# Patient Record
Sex: Male | Born: 1965 | Race: White | Hispanic: No | Marital: Married | Smoking: Never smoker
Health system: Southern US, Community
[De-identification: ages and names within clinical notes are randomized; demographics above are authoritative.]

## PROBLEM LIST (undated history)

## (undated) DIAGNOSIS — I10 Essential (primary) hypertension: Secondary | ICD-10-CM

## (undated) DIAGNOSIS — R519 Headache, unspecified: Secondary | ICD-10-CM

## (undated) DIAGNOSIS — R51 Headache: Secondary | ICD-10-CM

## (undated) DIAGNOSIS — B20 Human immunodeficiency virus [HIV] disease: Secondary | ICD-10-CM

## (undated) DIAGNOSIS — Z21 Asymptomatic human immunodeficiency virus [HIV] infection status: Secondary | ICD-10-CM

## (undated) DIAGNOSIS — G8929 Other chronic pain: Secondary | ICD-10-CM

## (undated) DIAGNOSIS — E785 Hyperlipidemia, unspecified: Secondary | ICD-10-CM

## (undated) HISTORY — PX: OTHER SURGICAL HISTORY: SHX169

## (undated) HISTORY — PX: DENTAL SURGERY: SHX609

## (undated) HISTORY — DX: Essential (primary) hypertension: I10

## (undated) HISTORY — DX: Hyperlipidemia, unspecified: E78.5

## (undated) HISTORY — DX: Headache, unspecified: R51.9

## (undated) HISTORY — DX: Other chronic pain: G89.29

## (undated) HISTORY — DX: Headache: R51

---

## 2006-07-06 ENCOUNTER — Emergency Department (HOSPITAL_COMMUNITY): Admission: EM | Admit: 2006-07-06 | Discharge: 2006-07-06 | Payer: Self-pay | Admitting: Emergency Medicine

## 2009-11-02 ENCOUNTER — Inpatient Hospital Stay (HOSPITAL_COMMUNITY): Admission: EM | Admit: 2009-11-02 | Discharge: 2009-11-04 | Payer: Self-pay | Admitting: Emergency Medicine

## 2009-11-03 ENCOUNTER — Encounter (INDEPENDENT_AMBULATORY_CARE_PROVIDER_SITE_OTHER): Payer: Self-pay | Admitting: Internal Medicine

## 2010-09-17 LAB — BASIC METABOLIC PANEL
BUN: 17 mg/dL (ref 6–23)
CO2: 25 mEq/L (ref 19–32)
Calcium: 9.3 mg/dL (ref 8.4–10.5)
Chloride: 110 mEq/L (ref 96–112)
Creatinine, Ser: 0.91 mg/dL (ref 0.4–1.5)
GFR calc non Af Amer: >60 mL/min (ref >60)
Glucose, Bld: 104 mg/dL — ABNORMAL HIGH (ref 70–99)

## 2010-09-17 LAB — TROPONIN I: Troponin I: <0.01 ng/mL (ref 0.00–0.06)

## 2010-09-17 LAB — DIFFERENTIAL: Neutro Abs: 5.1 10*3/uL (ref 1.7–7.7)

## 2010-09-17 LAB — CARDIAC PANEL(CRET KIN+CKTOT+MB+TROPI)
CK, MB: 1.8 ng/mL (ref 0.3–4.0)
CK, MB: 1.9 ng/mL (ref 0.3–4.0)
CK, MB: 2.1 ng/mL (ref 0.3–4.0)
Relative Index: 0.6 (ref 0.0–2.5)
Total CK: 220 U/L (ref 7–232)
Total CK: 242 U/L — ABNORMAL HIGH (ref 7–232)
Troponin I: 0.02 ng/mL (ref 0.00–0.06)
Troponin I: 0.02 ng/mL (ref 0.00–0.06)

## 2010-09-17 LAB — LIPID PANEL
HDL: 35 mg/dL — ABNORMAL LOW (ref >39)
Total CHOL/HDL Ratio: 4.8 RATIO

## 2010-09-17 LAB — CBC
Hemoglobin: 13.6 g/dL (ref 13.0–17.0)
Platelets: 238 10*3/uL (ref 150–400)
RBC: 4.29 MIL/uL (ref 4.22–5.81)
WBC: 8.5 10*3/uL (ref 4.0–10.5)

## 2010-09-17 LAB — CK TOTAL AND CKMB (NOT AT ARMC)
CK, MB: 2.6 ng/mL (ref 0.3–4.0)
Relative Index: 1 (ref 0.0–2.5)
Total CK: 264 U/L — ABNORMAL HIGH (ref 7–232)

## 2010-09-17 LAB — POCT CARDIAC MARKERS
CKMB, poc: 1.3 ng/mL (ref 1.0–8.0)
Myoglobin, poc: 61.8 ng/mL (ref 12–200)
Myoglobin, poc: 62.9 ng/mL (ref 12–200)
Troponin i, poc: <0.05 ng/mL (ref 0.00–0.09)
Troponin i, poc: <0.05 ng/mL (ref 0.00–0.09)

## 2013-07-07 ENCOUNTER — Other Ambulatory Visit: Payer: Self-pay | Admitting: Family Medicine

## 2013-07-07 DIAGNOSIS — R0602 Shortness of breath: Secondary | ICD-10-CM

## 2013-09-02 ENCOUNTER — Ambulatory Visit
Admission: RE | Admit: 2013-09-02 | Discharge: 2013-09-02 | Disposition: A | Discharge status: Home / Self Care | Payer: BC Managed Care – PPO | Source: Ambulatory Visit | Attending: Family Medicine | Admitting: Family Medicine

## 2013-09-02 ENCOUNTER — Other Ambulatory Visit: Payer: Self-pay | Admitting: Family Medicine

## 2013-09-02 DIAGNOSIS — IMO0001 Reserved for inherently not codable concepts without codable children: Secondary | ICD-10-CM

## 2013-09-02 DIAGNOSIS — R0600 Dyspnea, unspecified: Secondary | ICD-10-CM

## 2013-09-02 DIAGNOSIS — K219 Gastro-esophageal reflux disease without esophagitis: Secondary | ICD-10-CM

## 2013-09-02 DIAGNOSIS — R079 Chest pain, unspecified: Secondary | ICD-10-CM

## 2013-09-02 MED ORDER — IOHEXOL 350 MG/ML SOLN
100.0000 mL | Freq: Once | INTRAVENOUS | Status: AC | PRN
  Administered 2013-09-02: 100 mL via INTRAVENOUS

## 2013-09-13 ENCOUNTER — Encounter (HOSPITAL_COMMUNITY): Payer: Self-pay

## 2013-09-13 ENCOUNTER — Encounter: Payer: Self-pay | Admitting: Internal Medicine

## 2013-09-13 ENCOUNTER — Inpatient Hospital Stay (HOSPITAL_COMMUNITY)
Admission: AD | Admit: 2013-09-13 | Discharge: 2013-09-20 | DRG: 974 | Disposition: A | Discharge status: Home / Self Care | Payer: BC Managed Care – PPO | Source: Ambulatory Visit | Attending: Internal Medicine | Admitting: Internal Medicine

## 2013-09-13 ENCOUNTER — Ambulatory Visit (INDEPENDENT_AMBULATORY_CARE_PROVIDER_SITE_OTHER)
Admission: RE | Admit: 2013-09-13 | Discharge: 2013-09-13 | Disposition: A | Discharge status: Home / Self Care | Payer: BC Managed Care – PPO | Source: Ambulatory Visit | Attending: Internal Medicine | Admitting: Internal Medicine

## 2013-09-13 ENCOUNTER — Telehealth: Payer: Self-pay | Admitting: Internal Medicine

## 2013-09-13 ENCOUNTER — Ambulatory Visit (INDEPENDENT_AMBULATORY_CARE_PROVIDER_SITE_OTHER): Payer: BC Managed Care – PPO | Admitting: Internal Medicine

## 2013-09-13 ENCOUNTER — Encounter (INDEPENDENT_AMBULATORY_CARE_PROVIDER_SITE_OTHER): Payer: Self-pay

## 2013-09-13 VITALS — BP 152/80 | HR 113 | Temp 97.9°F | Ht 70.0 in | Wt 179.2 lb

## 2013-09-13 DIAGNOSIS — B20 Human immunodeficiency virus [HIV] disease: Principal | ICD-10-CM | POA: Diagnosis present

## 2013-09-13 DIAGNOSIS — E785 Hyperlipidemia, unspecified: Secondary | ICD-10-CM | POA: Diagnosis present

## 2013-09-13 DIAGNOSIS — K5900 Constipation, unspecified: Secondary | ICD-10-CM | POA: Diagnosis present

## 2013-09-13 DIAGNOSIS — I519 Heart disease, unspecified: Secondary | ICD-10-CM

## 2013-09-13 DIAGNOSIS — R918 Other nonspecific abnormal finding of lung field: Secondary | ICD-10-CM

## 2013-09-13 DIAGNOSIS — I10 Essential (primary) hypertension: Secondary | ICD-10-CM | POA: Diagnosis present

## 2013-09-13 DIAGNOSIS — J9600 Acute respiratory failure, unspecified whether with hypoxia or hypercapnia: Secondary | ICD-10-CM

## 2013-09-13 DIAGNOSIS — B370 Candidal stomatitis: Secondary | ICD-10-CM | POA: Diagnosis present

## 2013-09-13 DIAGNOSIS — IMO0002 Reserved for concepts with insufficient information to code with codable children: Secondary | ICD-10-CM

## 2013-09-13 DIAGNOSIS — Z8249 Family history of ischemic heart disease and other diseases of the circulatory system: Secondary | ICD-10-CM

## 2013-09-13 DIAGNOSIS — B59 Pneumocystosis: Secondary | ICD-10-CM | POA: Diagnosis present

## 2013-09-13 DIAGNOSIS — R12 Heartburn: Secondary | ICD-10-CM | POA: Diagnosis present

## 2013-09-13 DIAGNOSIS — Z7982 Long term (current) use of aspirin: Secondary | ICD-10-CM

## 2013-09-13 DIAGNOSIS — Z800 Family history of malignant neoplasm of digestive organs: Secondary | ICD-10-CM

## 2013-09-13 LAB — URINALYSIS, ROUTINE W REFLEX MICROSCOPIC
BILIRUBIN URINE: NEGATIVE
Glucose, UA: NEGATIVE mg/dL
Hgb urine dipstick: NEGATIVE
Ketones, ur: NEGATIVE mg/dL
Leukocytes, UA: NEGATIVE
NITRITE: NEGATIVE
PROTEIN: 30 mg/dL — AB
SPECIFIC GRAVITY, URINE: 1.02 (ref 1.005–1.030)
UROBILINOGEN UA: 1 mg/dL (ref 0.0–1.0)
pH: 6 (ref 5.0–8.0)

## 2013-09-13 LAB — URINE MICROSCOPIC-ADD ON

## 2013-09-13 LAB — COMPREHENSIVE METABOLIC PANEL
ALT: 22 U/L (ref 0–53)
AST: 64 U/L — ABNORMAL HIGH (ref 0–37)
Albumin: 3 g/dL — ABNORMAL LOW (ref 3.5–5.2)
Alkaline Phosphatase: 57 U/L (ref 39–117)
BILIRUBIN TOTAL: 0.5 mg/dL (ref 0.3–1.2)
BUN: 15 mg/dL (ref 6–23)
CHLORIDE: 95 meq/L — AB (ref 96–112)
CO2: 23 mEq/L (ref 19–32)
CREATININE: 0.93 mg/dL (ref 0.50–1.35)
Calcium: 8.8 mg/dL (ref 8.4–10.5)
GFR calc Af Amer: >90 mL/min (ref >90)
GFR calc non Af Amer: >90 mL/min (ref >90)
Glucose, Bld: 120 mg/dL — ABNORMAL HIGH (ref 70–99)
Potassium: 3.9 mEq/L (ref 3.7–5.3)
Sodium: 134 mEq/L — ABNORMAL LOW (ref 137–147)
TOTAL PROTEIN: 8.1 g/dL (ref 6.0–8.3)

## 2013-09-13 LAB — CBC WITH DIFFERENTIAL/PLATELET
BASOS ABS: 0 10*3/uL (ref 0.0–0.1)
Basophils Relative: 0 % (ref 0–1)
Eosinophils Absolute: 0 10*3/uL (ref 0.0–0.7)
Eosinophils Relative: 0 % (ref 0–5)
HCT: 36.2 % — ABNORMAL LOW (ref 39.0–52.0)
Hemoglobin: 12.4 g/dL — ABNORMAL LOW (ref 13.0–17.0)
LYMPHS ABS: 1 10*3/uL (ref 0.7–4.0)
Lymphocytes Relative: 8 % — ABNORMAL LOW (ref 12–46)
MCH: 29.9 pg (ref 26.0–34.0)
MCHC: 34.3 g/dL (ref 30.0–36.0)
MCV: 87.2 fL (ref 78.0–100.0)
MONO ABS: 0.8 10*3/uL (ref 0.1–1.0)
Monocytes Relative: 7 % (ref 3–12)
Neutro Abs: 10.1 10*3/uL — ABNORMAL HIGH (ref 1.7–7.7)
Neutrophils Relative %: 85 % — ABNORMAL HIGH (ref 43–77)
Platelets: 189 10*3/uL (ref 150–400)
RBC: 4.15 MIL/uL — ABNORMAL LOW (ref 4.22–5.81)
RDW: 14.2 % (ref 11.5–15.5)
WBC: 11.9 10*3/uL — ABNORMAL HIGH (ref 4.0–10.5)

## 2013-09-13 LAB — APTT: APTT: 34 s (ref 24–37)

## 2013-09-13 LAB — SEDIMENTATION RATE: Sed Rate: 96 mm/hr — ABNORMAL HIGH (ref 0–16)

## 2013-09-13 LAB — PROTIME-INR
INR: 1.11 (ref 0.00–1.49)
PROTHROMBIN TIME: 14.1 s (ref 11.6–15.2)

## 2013-09-13 LAB — PRO B NATRIURETIC PEPTIDE: Pro B Natriuretic peptide (BNP): 193.8 pg/mL — ABNORMAL HIGH (ref 0–125)

## 2013-09-13 LAB — RAPID HIV SCREEN (WH-MAU): Rapid HIV Screen: REACTIVE — AB

## 2013-09-13 MED ORDER — TRAMADOL HCL 50 MG PO TABS
50.0000 mg | ORAL_TABLET | Freq: Four times a day (QID) | ORAL | Status: DC | PRN
  Administered 2013-09-13: 50 mg via ORAL
  Filled 2013-09-13: qty 1

## 2013-09-13 MED ORDER — PANTOPRAZOLE SODIUM 40 MG PO TBEC
40.0000 mg | DELAYED_RELEASE_TABLET | Freq: Two times a day (BID) | ORAL | Status: DC
  Administered 2013-09-13 – 2013-09-20 (×14): 40 mg via ORAL
  Filled 2013-09-13 (×18): qty 1

## 2013-09-13 MED ORDER — DM-GUAIFENESIN ER 30-600 MG PO TB12
2.0000 | ORAL_TABLET | Freq: Two times a day (BID) | ORAL | Status: DC
  Administered 2013-09-13 – 2013-09-20 (×15): 2 via ORAL
  Filled 2013-09-13 (×16): qty 2

## 2013-09-13 MED ORDER — SULFAMETHOXAZOLE-TRIMETHOPRIM 400-80 MG/5ML IV SOLN
20.0000 mg/kg/d | Freq: Four times a day (QID) | INTRAVENOUS | Status: DC
  Administered 2013-09-13 – 2013-09-19 (×23): 403.2 mg via INTRAVENOUS
  Filled 2013-09-13 (×30): qty 25.2

## 2013-09-13 MED ORDER — ZOLPIDEM TARTRATE 10 MG PO TABS
10.0000 mg | ORAL_TABLET | Freq: Every evening | ORAL | Status: DC | PRN
  Administered 2013-09-14 – 2013-09-19 (×6): 10 mg via ORAL
  Filled 2013-09-13 (×7): qty 1

## 2013-09-13 MED ORDER — ACETAMINOPHEN 500 MG PO TABS
1000.0000 mg | ORAL_TABLET | Freq: Four times a day (QID) | ORAL | Status: DC | PRN
Start: 2013-09-13 — End: 2013-09-20

## 2013-09-13 MED ORDER — CLONIDINE HCL 0.1 MG PO TABS
0.1000 mg | ORAL_TABLET | Freq: Three times a day (TID) | ORAL | Status: DC
  Administered 2013-09-13 – 2013-09-20 (×22): 0.1 mg via ORAL
  Filled 2013-09-13 (×25): qty 1

## 2013-09-13 MED ORDER — ALBUTEROL SULFATE HFA 108 (90 BASE) MCG/ACT IN AERS: 1.0000 | INHALATION_SPRAY | Freq: Four times a day (QID) | RESPIRATORY_TRACT | Status: DC | PRN

## 2013-09-13 MED ORDER — ALBUTEROL SULFATE (2.5 MG/3ML) 0.083% IN NEBU
2.5000 mg | INHALATION_SOLUTION | Freq: Four times a day (QID) | RESPIRATORY_TRACT | Status: DC | PRN
  Administered 2013-09-16: 2.5 mg via RESPIRATORY_TRACT
  Filled 2013-09-13: qty 3

## 2013-09-13 MED ORDER — ASPIRIN EC 81 MG PO TBEC
81.0000 mg | DELAYED_RELEASE_TABLET | Freq: Every day | ORAL | Status: DC
  Administered 2013-09-13 – 2013-09-20 (×8): 81 mg via ORAL
  Filled 2013-09-13 (×8): qty 1

## 2013-09-13 MED ORDER — METHYLPREDNISOLONE SODIUM SUCC 125 MG IJ SOLR
80.0000 mg | Freq: Two times a day (BID) | INTRAMUSCULAR | Status: DC
  Administered 2013-09-13 – 2013-09-16 (×6): 80 mg via INTRAVENOUS
  Filled 2013-09-13 (×8): qty 1.28

## 2013-09-13 NOTE — Telephone Encounter (Signed)
Called made her aware the hospital has already called to speak with MW. Nothing further needed

## 2013-09-13 NOTE — Progress Notes (Signed)
Patient continue to c/o SOB, assess vital signs: BP=157/84, P=125, Temp=98.5, Sats=85% on 4L/[place], discontinue Maribel and switched to nou-re breathable mask, patient Sats increase to 95% on nou-re breathable mask, Dr notified, and will come to see patient ASAP, patient in fair condition at this time, will continue to monitor

## 2013-09-13 NOTE — H&P (Signed)
PULMONARY / CRITICAL CARE MEDICINE   Name: William Bender MRN: [id number] DOB: 06/22/1966    ADMISSION DATE:  09/13/2013 CONSULTATION DATE:     William Carbon MD :  William Bender PRIMARY SERVICE: William Bender/ PCCM  CHIEF COMPLAINT: sob  BRIEF PATIENT DESCRIPTION:  66 yowm never smoker with heart murmur req cath age 48-10 but excellent ex tol thereafter and no tendency to any respiratory problems and continued to play full pad hockey but had shingles spring 2014 and since then not quite the same with recurrent cough then doe x Dec 2014 while playing hockey which he stopped completely late Feb 2015 and referred to pulmonary 09/13/2013 by William Bender with acute deterioration cough and sob and sats only in 60's on arrival with diffuse infiltrates on cxr in setting of nl CT chest 09/02/13 so admitted to [facility] for expedient w/u/ 02 rx   SIGNIFICANT EVENTS / STUDIES:   LINES / TUBES:    CULTURES: BC x 2  09/13/13 >>>  ANTIBIOTICS: Zmax d/c 09/13/13   HISTORY OF PRESENT ILLNESS:   HPI  61 yowm never smoker with heart murmur req cath age [age] but excellent ex tol thereafter and no tendency to any respiratory problems and continued to play full pad hockey but had shingles spring 2014 and since then not quite the same with recurrent cough then doe x Dec 2014 while playing hockey which he stopped completely late Feb 2015 and referred to pulmonary 09/13/2013 by William Bender with acute deterioration cough and sob and sats only in 60's on arrival with diffuse infiltrattes on cxr in setting of nl CT chest 09/02/13 so admitted to [facility] at that point  09/13/2013 1st [place] [facility] office visit/ William Bender cc doe really started mid dec 2014 then dry cough bad x 2 weeks, burning in throat when lie down x sev weeks, a little better on zmax - ? May have had bad hb/ aspirated fish oil in last sev weeks. Some chills, no sweats. Lost about 30 lb x one year "trying to"  Tooth extracted x Jan 28th 2015 not infected but did take antibiotics  prophylactically.  No obvious other patterns in day to day or daytime variabilty or assoc cp or chest tightness, subjective wheeze overt sinus or hb symptoms. No unusual exp hx or h/o childhood pna/ asthma or knowledge of premature birth.  Sleeping ok without nocturnal or early am exacerbation of respiratory c/o's or need for noct saba. Also denies any obvious fluctuation of symptoms with weather or environmental changes or other aggravating or alleviating factors except as outlined above    PAST MEDICAL HISTORY :  Past Medical History  Diagnosis Date  . Hypertension   . Hyperlipidemia   . Chronic headaches    Past Surgical History  Procedure Laterality Date  . Bilateral shoulder surgery    . Dental surgery     Prior to Admission medications   Medication Sig Start Date End Date Taking? Authorizing Provider  acetaminophen (TYLENOL) 500 MG tablet Take 1,000 mg by mouth every 6 (six) hours as needed (Pain).   Yes Historical Provider, MD  albuterol (PROVENTIL HFA;VENTOLIN HFA) 108 (90 BASE) MCG/ACT inhaler Inhale 1 puff into the lungs every 6 (six) hours as needed for wheezing or shortness of breath.   Yes Historical Provider, MD  aspirin EC 81 MG tablet Take 81 mg by mouth daily.   Yes Historical Provider, MD  Dextromethorphan-Guaifenesin (MUCINEX DM) 30-600 MG TB12 Take 1 tablet by mouth 2 (two) times daily as needed (  congestion).    Yes Historical Provider, MD  diphenhydramine-acetaminophen (TYLENOL PM) 25-500 MG TABS Take 1 tablet by mouth at bedtime as needed (sleep).   Yes Historical Provider, MD  ibuprofen (ADVIL,MOTRIN) 200 MG tablet Take 400 mg by mouth 2 (two) times daily as needed (pain).    Yes Historical Provider, MD  omeprazole (PRILOSEC) 40 MG capsule Take 40 mg by mouth 2 (two) times daily.   Yes Historical Provider, MD  rosuvastatin (CRESTOR) 10 MG tablet Take 10 mg by mouth daily.   Yes Historical Provider, MD  valsartan-hydrochlorothiazide (DIOVAN-HCT) 160-25 MG per tablet  Take 1 tablet by mouth daily.   Yes Historical Provider, MD   Allergies  Allergen Reactions  . Penicillins Rash    FAMILY HISTORY:  Family History  Problem Relation Age of Onset  . Heart disease Father   . Heart disease Maternal Grandfather   . Colon cancer Maternal Grandfather    SOCIAL HISTORY:  reports that he has never smoked. He has never used smokeless tobacco. He reports that he does not drink alcohol or use illicit drugs.     SUBJECTIVE:  Severe hacking cough on insp, sats 92% on 2lpm at rest   VITAL SIGNS: Temp:  [97.6 F (36.4 C)-97.9 F (36.6 C)] 97.6 F (36.4 C) (03/17 1041) Pulse Rate:  [113-125] 118 (03/17 1140) Resp:  [20] 20 (03/17 1041) BP: (142-152)/(80-89) 142/87 mmHg (03/17 1140) SpO2:  [63 %-94 %] 93 % (03/17 1140) FiO2 (%):  [28 %] 28 % (03/17 0853) Weight:  [177 lb 11.1 oz (80.6 kg)-179 lb 3.2 oz (81.285 kg)] 177 lb 11.1 oz (80.6 kg) (03/17 1041) HEMODYNAMICS:   VENTILATOR SETTINGS: Vent Mode:  [-]  FiO2 (%):  [28 %] 28 % INTAKE / OUTPUT: Intake/Output   None     PHYSICAL EXAMINATION: General:  Thin anxious wm nad  Wt Readings from Last 3 Encounters:  09/13/13 177 lb 11.1 oz (80.6 kg)  09/13/13 179 lb 3.2 oz (81.285 kg)    HEENT: nl dentition, turbinates, and orophanx. Nl external ear canals without cough reflex - POS THRUSH NECK :  without JVD/Nodes/TM/ nl carotid upstrokes bilaterally  LUNGS: no acc muscle use, decreased bs bases c cough on insp, no sign crackles  CV:  RRR  no s3 or murmur or increase in P2, no edema  ABD:  soft and nontender with nl excursion in the supine position. No bruits or organomegaly, bowel sounds nl MS:  warm without deformities, calf tenderness, cyanosis or clubbing SKIN: warm and dry without lesions   NEURO:  alert, approp, no deficits    LABS:  CBC  Recent Labs Lab 09/13/13 1105  WBC 11.9*  HGB 12.4*  HCT 36.2*  PLT 189   Coag's  Recent Labs Lab 09/13/13 1105  APTT 34  INR 1.11    BMET No results found for this basename: NA, K, CL, CO2, BUN, CREATININE, GLUCOSE,  in the last 168 hours Electrolytes No results found for this basename: CALCIUM, MG, PHOS,  in the last 168 hours Sepsis Markers No results found for this basename: LATICACIDVEN, PROCALCITON, O2SATVEN,  in the last 168 hours ABG No results found for this basename: PHART, PCO2ART, PO2ART,  in the last 168 hours Liver Enzymes No results found for this basename: AST, ALT, ALKPHOS, BILITOT, ALBUMIN,  in the last 168 hours Cardiac Enzymes No results found for this basename: TROPONINI, PROBNP,  in the last 168 hours Glucose No results found for this basename: GLUCAP,  in  the last 168 hours  Imaging Dg Chest 2 View  09/13/2013   CLINICAL DATA:  Dyspnea  EXAM: CHEST  2 VIEW  COMPARISON:  09/02/2013  FINDINGS: Cardiomediastinal silhouette is stable. There is streaky bilateral infrahilar and basilar atelectasis or infiltrate with worsening in aeration. No convincing pulmonary edema. Bilateral spurring of humeral head.  IMPRESSION: Streaky bilateral infrahilar and basilar atelectasis or infiltrate with worsening in aeration. No convincing pulmonary edema. Follow-up examination is recommended.   Electronically Signed   By: Natasha Mead M.D.   On: 09/13/2013 09:28     CXR:  09/13/13 Streaky bilateral infrahilar and basilar atelectasis or infiltrate  with worsening in aeration. No convincing pulmonary edema    ASSESSMENT / PLAN:  1) Acute hypoxemic resp failure in pt with bilateral pulmonary infiltrates - CT Chest 09/02/13 completely nl  ddx =  Miscellaneous:Alv microlithiasis, alv proteinosis, asp, bronchiectais, BOOP   ARDS/ AIP Occupational dz/ HSP Neoplasm Infection esp PCP/ labs ordered Drugs:  Denies street drug exposure, does take fish oil > d/c Pulmonary emboli, Protein disorders Edema/Eosinophilic dz > labs ordered  Sarcoidosis Hist X / Hemorrhage Idiopathic    Sandrea Hughs, MD Pulmonary and  Critical Care Medicine [facility] Cell [phone] After 5:30 PM or weekends, call [phone]

## 2013-09-13 NOTE — Telephone Encounter (Signed)
i will take care of this via emr

## 2013-09-13 NOTE — Telephone Encounter (Signed)
Called and spoke with Nurse [facility]. He reports pt is not assigned to a hospitalis yet. Pt is requesting to have something for his cough. Pt advised them he was taking mucinex dm at home. Please advise MW thanks

## 2013-09-13 NOTE — Progress Notes (Signed)
Patient admitted to 5E as an direct admit from the doctor office during a routine check-up, patient alert and oriented, wife at bedside, transferred by wheel chair, tolerated well, BP=145/89, P=118, Temp=97.6, Sats=93% on 3L/[place], patient with c/o SOB, no pain at this time, skin dry and intact without any signs of infection or wounds, voiding clear yellow urine, pupils reactive to light pen, will continue to monitor patient during the reminder shift, patient in stable condition at this time

## 2013-09-13 NOTE — Progress Notes (Signed)
Subjective:    Patient ID: William Bender, male    DOB: 11/18/1965  MRN: [id number]  HPI   [age] yowm never smoker with heart murmur req cath age [age] but excellent ex tol thereafter and no tendency to any respiratory problems and continued to play full pad hockey but had shingles spring 2014 and since then not quite the same with recurrent cough then doe x Dec 2014 while playing hockey which he stopped completely late Feb 2015 and referred to pulmonary 09/13/2013 by William Bender with acute deterioration cough and sob and sats only in 60's on arrival with diffuse infiltrattes on cxr in setting of nl CT chest 09/02/13 [facility]    09/13/2013 1st [place] [facility] office visit/ Zayden Hahne cc doe really started mid dec 2014 then dry cough bad x 2 weeks, burning in throat when lie down x sev weeks, a little better on zmax  - ? May have had bad hb/ aspirated fish oil in last sev weeks. Some chills, no sweats. Lost about 30 lb x one year "trying to"  Tooth extracted x Jan 28th 2015  not infected but did take antibiotics prophylactically.   No obvious other patterns in day to day or daytime variabilty or assoc  cp or chest tightness, subjective wheeze overt sinus or hb symptoms. No unusual exp hx or h/o childhood pna/ asthma or knowledge of premature birth.  Sleeping ok without nocturnal  or early am exacerbation  of respiratory  c/o's or need for noct saba. Also denies any obvious fluctuation of symptoms with weather or environmental changes or other aggravating or alleviating factors except as outlined above   Current Medications, Allergies, Complete Past Medical History, Past Surgical History, Family History, and Social History were reviewed in [facility] electronic medical record.           Review of Systems  Constitutional: Positive for appetite change and unexpected weight change. Negative for fever, chills and activity change.  HENT: Positive for congestion. Negative for dental  problem, postnasal drip, rhinorrhea, sneezing, sore throat, trouble swallowing and voice change.   Eyes: Negative for visual disturbance.  Respiratory: Positive for cough and shortness of breath. Negative for choking.   Cardiovascular: Negative for chest pain and leg swelling.  Gastrointestinal: Negative for nausea, vomiting and abdominal pain.  Genitourinary: Negative for difficulty urinating.       Heartburn  Musculoskeletal: Negative for arthralgias.  Skin: Negative for rash.  Psychiatric/Behavioral: Negative for behavioral problems and confusion.       Objective:   Physical Exam  amb wm with harsh barking quality cough can't breath in without coughing  Wt Readings from Last 3 Encounters:  09/13/13 179 lb 3.2 oz (81.285 kg)     HEENT: nl dentition, turbinates. Nl external ear canals without cough reflex Pos thrush   NECK :  without JVD/Nodes/TM/ nl carotid upstrokes bilaterally   LUNGS: no acc muscle use, poor aeration both bases s sign crackles    CV:  RRR  no s3 or murmur or increase in P2, no edema   ABD:  soft and nontender with nl excursion in the supine position. No bruits or organomegaly, bowel sounds nl  MS:  warm without deformities, calf tenderness, cyanosis or clubbing  SKIN: warm and dry without lesions    NEURO:  alert, approp, no deficits      CXR  09/13/2013 :  Streaky bilateral infrahilar and basilar atelectasis or infiltrate  with worsening in aeration.  No convincing pulmonary edema. Follow-up  examination is recommended.       Assessment & Plan:

## 2013-09-13 NOTE — Progress Notes (Signed)
Discussed preliminary findings of HIV pos/ low lymphocytes progressive acute infiltrates/hypoxemia suggesting HIV/AIDS with PCP pna with pt, wife and Dr Luciana Axeomer  REc High dose bactrim/ solumerol/ ID to see

## 2013-09-13 NOTE — Progress Notes (Addendum)
Patient c/o cough and wanted doctor notified for cough medication, called doctor and order given for Mucinex MD 2 tabs, patient in stable condition at this time will continue to monitor

## 2013-09-13 NOTE — Patient Instructions (Signed)
[facility].

## 2013-09-13 NOTE — Progress Notes (Signed)
Echocardiogram 2D Echocardiogram has been performed.  William Bender 09/13/2013, 2:17 PM

## 2013-09-14 ENCOUNTER — Inpatient Hospital Stay (HOSPITAL_COMMUNITY): Payer: BC Managed Care – PPO

## 2013-09-14 DIAGNOSIS — Z21 Asymptomatic human immunodeficiency virus [HIV] infection status: Secondary | ICD-10-CM

## 2013-09-14 DIAGNOSIS — I10 Essential (primary) hypertension: Secondary | ICD-10-CM

## 2013-09-14 LAB — LIPID PANEL
Cholesterol: 143 mg/dL (ref 0–200)
HDL: 22 mg/dL — AB (ref >39)
LDL Cholesterol: 92 mg/dL (ref 0–99)
Total CHOL/HDL Ratio: 6.5 RATIO
Triglycerides: 146 mg/dL (ref <150)
VLDL: 29 mg/dL (ref 0–40)

## 2013-09-14 LAB — INFLUENZA PANEL BY PCR (TYPE A & B)
H1N1 flu by pcr: NOT DETECTED
Influenza A By PCR: NEGATIVE
Influenza B By PCR: NEGATIVE

## 2013-09-14 LAB — MRSA PCR SCREENING: MRSA by PCR: INVALID — AB

## 2013-09-14 LAB — T-HELPER CELLS (CD4) COUNT (NOT AT ARMC)
CD4 % Helper T Cell: 5 % — ABNORMAL LOW (ref 33–55)
CD4 T Cell Abs: 60 /uL — ABNORMAL LOW (ref 400–2700)

## 2013-09-14 LAB — GLUCOSE, CAPILLARY: GLUCOSE-CAPILLARY: 149 mg/dL — AB (ref 70–99)

## 2013-09-14 MED ORDER — SODIUM CHLORIDE 3 % IN NEBU
5.0000 mL | INHALATION_SOLUTION | Freq: Once | RESPIRATORY_TRACT | Status: AC
  Administered 2013-09-14: 22:00:00 via RESPIRATORY_TRACT
  Filled 2013-09-14: qty 8

## 2013-09-14 MED ORDER — ENOXAPARIN SODIUM 30 MG/0.3ML ~~LOC~~ SOLN
30.0000 mg | SUBCUTANEOUS | Status: DC
  Administered 2013-09-14 – 2013-09-20 (×7): 30 mg via SUBCUTANEOUS
  Filled 2013-09-14 (×7): qty 0.3

## 2013-09-14 MED ORDER — TUBERCULIN PPD 5 UNIT/0.1ML ID SOLN
5.0000 [IU] | Freq: Once | INTRADERMAL | Status: AC
  Administered 2013-09-14: 5 [IU] via INTRADERMAL
  Filled 2013-09-14: qty 0.1

## 2013-09-14 MED ORDER — INFLUENZA VAC SPLIT QUAD 0.5 ML IM SUSP
0.5000 mL | INTRAMUSCULAR | Status: AC
  Administered 2013-09-15: 0.5 mL via INTRAMUSCULAR
  Filled 2013-09-14 (×2): qty 0.5

## 2013-09-14 MED ORDER — PNEUMOCOCCAL VAC POLYVALENT 25 MCG/0.5ML IJ INJ
0.5000 mL | INJECTION | INTRAMUSCULAR | Status: AC
  Administered 2013-09-15: 0.5 mL via INTRAMUSCULAR
  Filled 2013-09-14 (×2): qty 0.5

## 2013-09-14 MED ORDER — FLUCONAZOLE 200 MG PO TABS
200.0000 mg | ORAL_TABLET | Freq: Every day | ORAL | Status: DC
  Administered 2013-09-14 – 2013-09-19 (×6): 200 mg via ORAL
  Filled 2013-09-14 (×6): qty 1

## 2013-09-14 MED ORDER — AZITHROMYCIN 600 MG PO TABS
1200.0000 mg | ORAL_TABLET | ORAL | Status: DC
  Administered 2013-09-14: 1200 mg via ORAL
  Filled 2013-09-14: qty 2

## 2013-09-14 NOTE — Progress Notes (Addendum)
PULMONARY / CRITICAL CARE MEDICINE   Name: William Bender MRN: [id number] DOB: 03-22-1966    ADMISSION DATE:  09/13/2013 CONSULTATION DATE:     [name] MD :  [name] PRIMARY SERVICE: Wert/ PCCM  CHIEF COMPLAINT: sob  BRIEF PATIENT DESCRIPTION:  48 yo male never smoker admitted 3/17 with acute hypoxemic respiratory failure with bilateral infiltrates in setting of a new diagnosis of HIV.   SIGNIFICANT EVENTS / STUDIES:    LINES / TUBES:    CULTURES: BC x 2  09/13/13 >>> PCP DFA sputum 3/18 >>  ANTIBIOTICS: Bactrim IV >>   SUBJECTIVE:  Moved to SDU overnight for worsening hypoxemia; his tachypnea is about the same  VITAL SIGNS: Temp:  [97.1 F (36.2 C)-99.3 F (37.4 C)] 97.6 F (36.4 C) (03/18 0800) Pulse Rate:  [60-125] 80 (03/18 0800) Resp:  [18-38] 19 (03/18 0800) BP: (118-157)/(69-89) 135/69 mmHg (03/18 0800) SpO2:  [85 %-99 %] 93 % (03/18 0800) FiO2 (%):  [45 %-55 %] 50 % (03/18 0800) Weight:  [79.4 kg (175 lb 0.7 oz)-80.6 kg (177 lb 11.1 oz)] 79.4 kg (175 lb 0.7 oz) (03/18 0400) HEMODYNAMICS:   VENTILATOR SETTINGS: Vent Mode:  [-]  FiO2 (%):  [45 %-55 %] 50 % INTAKE / OUTPUT: Intake/Output     03/17 0701 - 03/18 0700 03/18 0701 - 03/19 0700   P.O. 240    IV Piggyback 1575.6    Total Intake(mL/kg) 1815.6 (22.9)    Urine (mL/kg/hr) 1475    Total Output 1475     Net +340.6            PHYSICAL EXAMINATION:  Gen: anxious, tachypnic but speaking in full sentences and not in distress HEENT: NCAT, PERRL, OP with thrush PULM: Occasional crackle but mostly clear throughout CV: RRR, nl S1/S2 AB: BS+, soft, nontender Ext: warm, no edema Neuro: A&Ox4, maew  LABS:  CBC  Recent Labs Lab 09/13/13 1105  WBC 11.9*  HGB 12.4*  HCT 36.2*  PLT 189   Coag's  Recent Labs Lab 09/13/13 1105  APTT 34  INR 1.11   BMET  Recent Labs Lab 09/13/13 1105  NA 134*  K 3.9  CL 95*  CO2 23  BUN 15  CREATININE 0.93  GLUCOSE 120*    Electrolytes  Recent Labs Lab 09/13/13 1105  CALCIUM 8.8   Sepsis Markers No results found for this basename: LATICACIDVEN, PROCALCITON, O2SATVEN,  in the last 168 hours ABG No results found for this basename: PHART, PCO2ART, PO2ART,  in the last 168 hours Liver Enzymes  Recent Labs Lab 09/13/13 1105  AST 64*  ALT 22  ALKPHOS 57  BILITOT 0.5  ALBUMIN 3.0*   Cardiac Enzymes  Recent Labs Lab 09/13/13 1105  PROBNP 193.8*   Glucose No results found for this basename: GLUCAP,  in the last 168 hours  Imaging Dg Chest 2 View  09/13/2013   CLINICAL DATA:  Dyspnea  EXAM: CHEST  2 VIEW  COMPARISON:  09/02/2013  FINDINGS: Cardiomediastinal silhouette is stable. There is streaky bilateral infrahilar and basilar atelectasis or infiltrate with worsening in aeration. No convincing pulmonary edema. Bilateral spurring of humeral head.  IMPRESSION: Streaky bilateral infrahilar and basilar atelectasis or infiltrate with worsening in aeration. No convincing pulmonary edema. Follow-up examination is recommended.   Electronically Signed   By: Natasha Mead M.D.   On: 09/13/2013 09:28     CXR:  09/13/13 Streaky bilateral infrahilar and basilar atelectasis or infiltrate  with worsening in aeration. No convincing  pulmonary edema    ASSESSMENT / PLAN:  1) Acute hypoxemic resp failure in pt with bilateral pulmonary infiltrates - CT Chest 09/02/13 completely nl - 3/17 CXR > bilateral air space disease   ddx =  We are most concerned about the possibility of PCP pneumonia at this point considering his new diagnosis of HIV ddx includes : other virus (influenza, etc), neoplasm, eosinophilic pneumonia, AIP, NSIP (all seem less likely)  Plan: at this point the best approach would be a bronchoscopy but in setting of increasing O2 requirements and tachypnea this could only be safely performed if intubated, he does not want that right now. -Induce sputum for PCP -continue bactrim and solumedrol for  PCP -Titrate O2 as needed -close monitoring in SDU  2) ID: New diagnosis of HIV, overall picture worrisome for AIDS (weight loss, six months of fever), but need lab work to support this (CD4, Viral Load, etc) -ID consult pending  3) Hypertension -continue clonidine  4) Best practice: Feeding: regular diet Analgesia: APAP prn Sedation: n/a Thromboprophylaxis: lovenox HOB >30 degrees Ulcer prophylaxis: ppi Glucose control: monitor poc glucose  Yolonda KidaMCQUAID, Marcha Licklider [place] PCCM Pager: [phone] Cell: [phone] If no response, call [phone]

## 2013-09-14 NOTE — Progress Notes (Signed)
CARE MANAGEMENT NOTE 09/14/2013  Patient:  William Bender,William Bender   Account Number:  [id number]  Date Initiated:  09/14/2013  Documentation initiated by:  Kenesha Moshier  Subjective/Objective Assessment:   pt admitted due to confirmed pna, increased cough over past several weeks, new finding of positive hiv fast test, wife is aware.     Action/Plan:   tbd will plan at this home home with self care.   Anticipated DC Date:  09/17/2013   Anticipated DC Plan:  HOME/SELF CARE  In-house referral  NA      DC Planning Services  NA      [facility]  [facility]   Choice offered to / List presented to:  NA   DME arranged  NA      DME agency  NA     HH arranged  NA      HH agency  NA   Status of service:  In process, will continue to follow Medicare Important Message given?  NA - LOS <3 / Initial given by admissions (If response is "NO", the following Medicare IM given date fields will be blank) Date Medicare IM given:   Date Additional Medicare IM given:    Discharge Disposition:    Per UR Regulation:  Reviewed for med. necessity/level of care/duration of stay  If discussed at Long Length of Stay Meetings, dates discussed:    Comments:  [id number]/Gabryel Files Stark JockDavis, RN, BSN, [place] [phone] Chart Reviewed for discharge and hospital needs. Discharge needs at time of review:  None present will follow for needs. Review of patient progress due on [id number]. confirmed pna by cxr, new dx of hiv.

## 2013-09-14 NOTE — Consult Note (Addendum)
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   Comment- The patient, his wife and his daughter at length. I spoke him regarding prognosis, importance of taking his medications, and giving him involved in support groups. His wife has been tested today. I did not investigate risk factors due to the presence of multiple family members being present. In speaking to the patient and his family, made clear to him that if he takes his medication, his life expectancy should be close to normal (provided his CD4 count rises on medication). I also made it clear to them that I will be available to help them navigate her hospital stay as well as becoming enrolled in our clinic, and getting medications.  Thank you so much for this interesting consult,   Bobby Rumpf (pager) [phone] [email or web address]  Ragnar Waas is an 48 y.o. male.  HPI: 48 yo M with hx of shingles in 2014, who has had worsening DOE over the last 2 months. He underwent CT chest 09/02/13 which was unrevealing. He was sent to Pulmonary on 3-17 and was found to have SpO2 in the 63 and was emergently admitted. His CXR showed: Streaky bilateral infrahilar and basilar atelectasis or infiltrate  with worsening in aeration He was then found to be HIV+.  Influenza (-) He was started on bactrim and solumedrol. He is now found to have CD4 60.   He is married, has 2 children (16 and 12). They are healthy. He and his wife deny any HIV specific  risk factors except for a vague incident that happened 20-25 years ago.  Past Medical History  Diagnosis Date  . Hypertension   . Hyperlipidemia   . Chronic headaches     Past Surgical History  Procedure Laterality Date  . Bilateral shoulder surgery    . Dental surgery       Allergies  Allergen Reactions  . Penicillins Rash    Medications:  Scheduled: . aspirin EC  81 mg Oral Daily  . cloNIDine  0.1 mg Oral TID  . dextromethorphan-guaiFENesin  2 tablet Oral BID  . enoxaparin (LOVENOX) injection  30 mg Subcutaneous Q24H  . methylPREDNISolone (SOLU-MEDROL) injection  80 mg Intravenous Q12H  . pantoprazole  40 mg Oral BID AC  . sulfamethoxazole-trimethoprim  20 mg/kg/day Intravenous 4 times per day    Abtx:  Anti-infectives   Start     Dose/Rate Route Frequency Ordered Stop   09/13/13 1800  sulfamethoxazole-trimethoprim (BACTRIM) 403.2 mg in dextrose 5 % 500 mL IVPB     20 mg/kg/day  80.6 kg 350.1 mL/hr over 90 Minutes Intravenous 4 times per day 09/13/13 1732        Total days of antibiotics 1 (bactrim)          Social History:  reports that he has never smoked. He has never used smokeless tobacco. He reports that he does not drink alcohol or use illicit drugs.  Family History  Problem Relation Age of Onset  . Heart disease Father   . Heart disease Maternal Grandfather   .  Colon cancer Maternal Grandfather     General ROS: Positive intentional weight loss, no lymphadenopathy. Dyspnea on exertion positive. Nonproductive cough. Positive chills for 2 months. No fevers. No diarrhea. No gum bleeds or nosebleeds. No oral ulcers. No diarrhea. See history of present illness.  Blood pressure 135/72, pulse 84, temperature 97.9 F (36.6 C), temperature source Axillary, resp. rate 32, height 5' 10"  (1.778 m), weight 79.4 kg (175 lb 0.7 oz), SpO2 97.00%. General appearance: alert, cooperative and no distress Eyes: negative findings: conjunctivae and sclerae normal and  pupils equal, round, reactive to light and accomodation Throat: abnormal findings: thrush Neck: no adenopathy and supple, symmetrical, trachea midline Lungs: diminished breath sounds bilaterally and rhonchi bilaterally Heart: regular rate and rhythm Abdomen: normal findings: bowel sounds normal and soft, non-tender Extremities: edema none Skin: Skin color, texture, turgor normal. No rashes or lesions Lymph nodes: Cervical adenopathy: none and Axillary adenopathy: none Neurologic: Grossly normal   Results for orders placed during the hospital encounter of 09/13/13 (from the past 48 hour(s))  CBC WITH DIFFERENTIAL     Status: Abnormal   Collection Time    09/13/13 11:05 AM      Result Value Ref Range   WBC 11.9 (*) 4.0 - 10.5 K/uL   RBC 4.15 (*) 4.22 - 5.81 MIL/uL   Hemoglobin 12.4 (*) 13.0 - 17.0 g/dL   HCT 36.2 (*) 39.0 - 52.0 %   MCV 87.2  78.0 - 100.0 fL   MCH 29.9  26.0 - 34.0 pg   MCHC 34.3  30.0 - 36.0 g/dL   RDW 14.2  11.5 - 15.5 %   Platelets 189  150 - 400 K/uL   Neutrophils Relative % 85 (*) 43 - 77 %   Lymphocytes Relative 8 (*) 12 - 46 %   Monocytes Relative 7  3 - 12 %   Eosinophils Relative 0  0 - 5 %   Basophils Relative 0  0 - 1 %   Neutro Abs 10.1 (*) 1.7 - 7.7 K/uL   Lymphs Abs 1.0  0.7 - 4.0 K/uL   Monocytes Absolute 0.8  0.1 - 1.0 K/uL   Eosinophils Absolute 0.0  0.0 - 0.7 K/uL   Basophils Absolute 0.0  0.0 - 0.1 K/uL   WBC Morphology VACUOLATED NEUTROPHILS     Smear Review LARGE PLATELETS PRESENT    COMPREHENSIVE METABOLIC PANEL     Status: Abnormal   Collection Time    09/13/13 11:05 AM      Result Value Ref Range   Sodium 134 (*) 137 - 147 mEq/L   Potassium 3.9  3.7 - 5.3 mEq/L   Chloride 95 (*) 96 - 112 mEq/L   CO2 23  19 - 32 mEq/L   Glucose, Bld 120 (*) 70 - 99 mg/dL   BUN 15  6 - 23 mg/dL   Creatinine, Ser 0.93  0.50 - 1.35 mg/dL   Calcium 8.8  8.4 - 10.5 mg/dL   Total Protein 8.1  6.0 - 8.3 g/dL   Albumin 3.0 (*) 3.5 - 5.2 g/dL   AST 64  (*) 0 - 37 U/L   ALT 22  0 - 53 U/L   Alkaline Phosphatase 57  39 - 117 U/L   Total Bilirubin 0.5  0.3 - 1.2 mg/dL   GFR calc non Af Amer >90  >90 mL/min   GFR calc Af Amer >90  >90 mL/min   Comment: (NOTE)     The eGFR has been calculated  using the CKD EPI equation.     This calculation has not been validated in all clinical situations.     eGFR's persistently <90 mL/min signify possible Chronic Kidney     Disease.  APTT     Status: None   Collection Time    09/13/13 11:05 AM      Result Value Ref Range   aPTT 34  24 - 37 seconds  PROTIME-INR     Status: None   Collection Time    09/13/13 11:05 AM      Result Value Ref Range   Prothrombin Time 14.1  11.6 - 15.2 seconds   INR 1.11  0.00 - 1.49  CULTURE, BLOOD (ROUTINE X 2)     Status: None   Collection Time    09/13/13 11:05 AM      Result Value Ref Range   Specimen Description BLOOD LEFT ARM     Special Requests BOTTLES DRAWN AEROBIC AND ANAEROBIC 5CC EACH     Culture  Setup Time       Value: 09/13/2013 14:03     Performed at [facility]   Culture       Value:        BLOOD CULTURE RECEIVED NO GROWTH TO DATE CULTURE WILL BE HELD FOR 5 DAYS BEFORE ISSUING A FINAL NEGATIVE REPORT     Performed at [facility]   Report Status PENDING    PRO B NATRIURETIC PEPTIDE     Status: Abnormal   Collection Time    09/13/13 11:05 AM      Result Value Ref Range   Pro B Natriuretic peptide (BNP) 193.8 (*) 0 - 125 pg/mL  RAPID HIV SCREEN ([facility])     Status: Abnormal   Collection Time    09/13/13 11:05 AM      Result Value Ref Range   SUDS Rapid HIV Screen Reactive (*) NON REACTIVE   Comment: REPEATED TO VERIFY     RESULT CALLED TO, READ BACK BY AND VERIFIED WITH:     D.LITTLE,RN AT 1225 ON 3.17.15 BY W.SHEA                This test is not diagnostic     of HIV infection and must be     sent for confirmatory testing     by [facility] before rendering     a diagnosis of positivity for     HIV infection.     Sent  for confirmatory testing.  SEDIMENTATION RATE     Status: Abnormal   Collection Time    09/13/13 11:05 AM      Result Value Ref Range   Sed Rate 96 (*) 0 - 16 mm/hr  CULTURE, BLOOD (ROUTINE X 2)     Status: None   Collection Time    09/13/13 11:15 AM      Result Value Ref Range   Specimen Description BLOOD RIGHT ARM     Special Requests BOTTLES DRAWN AEROBIC AND ANAEROBIC 10CC EACH     Culture  Setup Time       Value: 09/13/2013 14:03     Performed at [facility]   Culture       Value:        BLOOD CULTURE RECEIVED NO GROWTH TO DATE CULTURE WILL BE HELD FOR 5 DAYS BEFORE ISSUING A FINAL NEGATIVE REPORT     Performed at [facility]   Report Status PENDING  URINALYSIS, ROUTINE W REFLEX MICROSCOPIC     Status: Abnormal   Collection Time    09/13/13  2:41 PM      Result Value Ref Range   Color, Urine YELLOW  YELLOW   APPearance CLEAR  CLEAR   Specific Gravity, Urine 1.020  1.005 - 1.030   pH 6.0  5.0 - 8.0   Glucose, UA NEGATIVE  NEGATIVE mg/dL   Hgb urine dipstick NEGATIVE  NEGATIVE   Bilirubin Urine NEGATIVE  NEGATIVE   Ketones, ur NEGATIVE  NEGATIVE mg/dL   Protein, ur 30 (*) NEGATIVE mg/dL   Urobilinogen, UA 1.0  0.0 - 1.0 mg/dL   Nitrite NEGATIVE  NEGATIVE   Leukocytes, UA NEGATIVE  NEGATIVE  URINE MICROSCOPIC-ADD ON     Status: None   Collection Time    09/13/13  2:41 PM      Result Value Ref Range   Squamous Epithelial / LPF RARE  RARE  T-HELPER CELLS (CD4) COUNT     Status: Abnormal   Collection Time    09/13/13  6:36 PM      Result Value Ref Range   CD4 T Cell Abs 60 (*) 400 - 2700 /uL   CD4 % Helper T Cell 5 (*) 33 - 55 %  MRSA PCR SCREENING     Status: Abnormal   Collection Time    09/13/13  8:17 PM      Result Value Ref Range   MRSA by PCR INVALID RESULTS, SPECIMEN SENT FOR CULTURE (*) NEGATIVE   Comment: RESULT CALLED TO, READ BACK BY AND VERIFIED WITH:     SPOKE WITH BANKS,T RN [phone] [place],N     CORRECTED ON 03/18 AT 1155:  PREVIOUSLY REPORTED AS RESULT CALLED TO, READ BACK BY AND VERIFIED WITH: SPOKE WITH BANKS,T RN [phone] COVINGTON,N        The GeneXpert MRSA Assay (FDA approved for NASAL specimens only), is one component of a      comprehensive MRSA colonization surveillance program. It is not intended to diagnose MRSA infection nor to guide or monitor treatment for MRSA infections.  INFLUENZA PANEL BY PCR (TYPE A & B, H1N1)     Status: None   Collection Time    09/14/13 12:54 PM      Result Value Ref Range   Influenza A By PCR NEGATIVE  NEGATIVE   Influenza B By PCR NEGATIVE  NEGATIVE   H1N1 flu by pcr NOT DETECTED  NOT DETECTED   Comment:            The Xpert Flu assay (FDA approved for     nasal aspirates or washes and     nasopharyngeal swab specimens), is     intended as an aid in the diagnosis of     influenza and should not be used as     a sole basis for treatment.     Performed at [facility] 09/13/2013 1115   SPECREQUEST BOTTLES DRAWN AEROBIC AND ANAEROBIC 10CC EACH 09/13/2013 1115   CULT  Value:        BLOOD CULTURE RECEIVED NO GROWTH TO DATE CULTURE WILL BE HELD FOR 5 DAYS BEFORE ISSUING A FINAL NEGATIVE REPORT Performed at [facility] 09/13/2013 1115   REPTSTATUS PENDING 09/13/2013 1115   Dg Chest 2 View  09/13/2013   CLINICAL DATA:  Dyspnea  EXAM: CHEST  2 VIEW  COMPARISON:  09/02/2013  FINDINGS: Cardiomediastinal silhouette is stable. There is streaky bilateral infrahilar and basilar atelectasis or infiltrate with worsening in aeration. No convincing pulmonary edema. Bilateral spurring of humeral head.  IMPRESSION: Streaky bilateral infrahilar and basilar atelectasis or infiltrate with worsening in aeration. No convincing pulmonary edema. Follow-up examination is recommended.   Electronically Signed   By: Lahoma Crocker M.D.   On: 09/13/2013 09:28   Dg Chest Port 1 View  09/14/2013   CLINICAL DATA:  Shortness of breath.   EXAM: PORTABLE CHEST - 1 VIEW  COMPARISON:  DG CHEST 2 VIEW dated 09/13/2013; CT ANGIO CHEST W/CM &/OR WO/CM dated 09/02/2013  FINDINGS: Mild cardiomegaly with indistinct pulmonary vasculature and bilateral airspace opacities, mildly increased from the prior exam.  Degenerative spurring both glenohumeral joints.  IMPRESSION: 1. Cardiomegaly with indistinct pulmonary vasculature in bilateral indistinct airspace opacities, mildly worsened from prior. I have reviewed the patient's chart and note the recent diagnosis of HIV with low lymphocytes, grade 1 diastolic dysfunction on the echocardiography, and mildly elevated BNP. From a purely radiographic standpoint, the appearance on today's exam could be a manifestation of mild pulmonary edema or atypical pneumonia with incidental mild cardiomegaly.   Electronically Signed   By: Sherryl Barters M.D.   On: 09/14/2013 11:07   Recent Results (from the past 240 hour(s))  CULTURE, BLOOD (ROUTINE X 2)     Status: None   Collection Time    09/13/13 11:05 AM      Result Value Ref Range Status   Specimen Description BLOOD LEFT ARM   Final   Special Requests BOTTLES DRAWN AEROBIC AND ANAEROBIC 5CC EACH   Final   Culture  Setup Time     Final   Value: 09/13/2013 14:03     Performed at [facility]   Culture     Final   Value:        BLOOD CULTURE RECEIVED NO GROWTH TO DATE CULTURE WILL BE HELD FOR 5 DAYS BEFORE ISSUING A FINAL NEGATIVE REPORT     Performed at [facility]   Report Status PENDING   Incomplete  CULTURE, BLOOD (ROUTINE X 2)     Status: None   Collection Time    09/13/13 11:15 AM      Result Value Ref Range Status   Specimen Description BLOOD RIGHT ARM   Final   Special Requests BOTTLES DRAWN AEROBIC AND ANAEROBIC 10CC EACH   Final   Culture  Setup Time     Final   Value: 09/13/2013 14:03     Performed at [facility]   Culture     Final   Value:        BLOOD CULTURE RECEIVED NO GROWTH TO DATE CULTURE WILL BE HELD FOR 5  DAYS BEFORE ISSUING A FINAL NEGATIVE REPORT     Performed at [facility]   Report Status PENDING   Incomplete  MRSA PCR SCREENING     Status: Abnormal   Collection Time    09/13/13  8:17 PM      Result Value Ref Range Status   MRSA by PCR INVALID RESULTS, SPECIMEN SENT FOR CULTURE (*) NEGATIVE Corrected   Comment: RESULT CALLED TO, READ BACK BY AND VERIFIED WITH:     SPOKE WITH BANKS,T RN [phone] C[place],N     CORRECTED ON 03/18 AT 1155: PREVIOUSLY REPORTED AS RESULT CALLED TO, READ BACK BY AND VERIFIED WITH: SPOKE WITH BANKS,T [place] [phone] [id number],W  The GeneXpert MRSA Assay (FDA approved for NASAL specimens only), is one component of a      comprehensive MRSA colonization surveillance program. It is not intended to diagnose MRSA infection nor to guide or monitor treatment for MRSA infections.      09/14/2013, 4:00 PM     LOS: 1 day

## 2013-09-14 NOTE — Progress Notes (Signed)
3% hypertonic saline neb tx given per rx for sputum induction.  Pt tolerated tx well, however unable to produce sputum sample at this time.  RN notified.  Pt encouraged to use sterile specimen cup left at bedside and to advise RN should he cough some mucus up.

## 2013-09-15 ENCOUNTER — Inpatient Hospital Stay (HOSPITAL_COMMUNITY): Payer: BC Managed Care – PPO

## 2013-09-15 DIAGNOSIS — B370 Candidal stomatitis: Secondary | ICD-10-CM

## 2013-09-15 DIAGNOSIS — B20 Human immunodeficiency virus [HIV] disease: Principal | ICD-10-CM

## 2013-09-15 LAB — GLUCOSE, CAPILLARY
GLUCOSE-CAPILLARY: 142 mg/dL — AB (ref 70–99)
GLUCOSE-CAPILLARY: 164 mg/dL — AB (ref 70–99)
Glucose-Capillary: 197 mg/dL — ABNORMAL HIGH (ref 70–99)
Glucose-Capillary: 156 mg/dL — ABNORMAL HIGH (ref 70–99)
Glucose-Capillary: 169 mg/dL — ABNORMAL HIGH (ref 70–99)
Glucose-Capillary: 159 mg/dL — ABNORMAL HIGH (ref 70–99)
Glucose-Capillary: 190 mg/dL — ABNORMAL HIGH (ref 70–99)

## 2013-09-15 LAB — BASIC METABOLIC PANEL
BUN: 17 mg/dL (ref 6–23)
CO2: 19 mEq/L (ref 19–32)
CREATININE: 0.88 mg/dL (ref 0.50–1.35)
Calcium: 8.8 mg/dL (ref 8.4–10.5)
Chloride: 98 mEq/L (ref 96–112)
Glucose, Bld: 130 mg/dL — ABNORMAL HIGH (ref 70–99)
Potassium: 4.1 mEq/L (ref 3.7–5.3)
Sodium: 134 mEq/L — ABNORMAL LOW (ref 137–147)

## 2013-09-15 LAB — CBC WITH DIFFERENTIAL/PLATELET
BASOS ABS: 0 10*3/uL (ref 0.0–0.1)
Basophils Relative: 0 % (ref 0–1)
EOS ABS: 0 10*3/uL (ref 0.0–0.7)
Eosinophils Relative: 0 % (ref 0–5)
HEMATOCRIT: 33.1 % — AB (ref 39.0–52.0)
Hemoglobin: 11.8 g/dL — ABNORMAL LOW (ref 13.0–17.0)
LYMPHS ABS: 0.8 10*3/uL (ref 0.7–4.0)
Lymphocytes Relative: 4 % — ABNORMAL LOW (ref 12–46)
MCH: 30.3 pg (ref 26.0–34.0)
MCHC: 35.6 g/dL (ref 30.0–36.0)
MCV: 84.9 fL (ref 78.0–100.0)
Monocytes Absolute: 0.8 10*3/uL (ref 0.1–1.0)
Monocytes Relative: 4 % (ref 3–12)
NEUTROS ABS: 18.8 10*3/uL — AB (ref 1.7–7.7)
Neutrophils Relative %: 92 % — ABNORMAL HIGH (ref 43–77)
Platelets: 244 10*3/uL (ref 150–400)
RBC: 3.9 MIL/uL — ABNORMAL LOW (ref 4.22–5.81)
RDW: 13.9 % (ref 11.5–15.5)
WBC: 20.4 10*3/uL — ABNORMAL HIGH (ref 4.0–10.5)

## 2013-09-15 LAB — RESPIRATORY VIRUS PANEL
ADENOVIRUS: NOT DETECTED
INFLUENZA A H1: NOT DETECTED
Influenza A H3: NOT DETECTED
Influenza A: NOT DETECTED
Influenza B: NOT DETECTED
METAPNEUMOVIRUS: NOT DETECTED
PARAINFLUENZA 2 A: NOT DETECTED
Parainfluenza 1: NOT DETECTED
Parainfluenza 3: NOT DETECTED
RHINOVIRUS: NOT DETECTED
Respiratory Syncytial Virus A: NOT DETECTED
Respiratory Syncytial Virus B: NOT DETECTED

## 2013-09-15 LAB — PNEUMOCYSTIS JIROVECI SMEAR BY DFA: Pneumocystis jiroveci Ag: NEGATIVE

## 2013-09-15 LAB — HEPATITIS PANEL, ACUTE
HCV Ab: NEGATIVE
Hep A IgM: NONREACTIVE
Hep B C IgM: NONREACTIVE
Hepatitis B Surface Ag: NEGATIVE

## 2013-09-15 LAB — HIV-1 RNA ULTRAQUANT REFLEX TO GENTYP+
HIV 1 RNA Quant: 626427 copies/mL — ABNORMAL HIGH (ref <20)
HIV-1 RNA Quant, Log: 5.8 {Log} — ABNORMAL HIGH (ref <1.30)

## 2013-09-15 LAB — GC/CHLAMYDIA PROBE AMP
CT Probe RNA: NEGATIVE
GC Probe RNA: NEGATIVE

## 2013-09-15 LAB — RPR: RPR Ser Ql: NONREACTIVE

## 2013-09-15 LAB — LACTATE DEHYDROGENASE: LDH: 631 U/L — ABNORMAL HIGH (ref 94–250)

## 2013-09-15 MED ORDER — HEPATITIS B VAC RECOMBINANT 5 MCG/0.5ML IJ SUSP
0.5000 mL | Freq: Once | INTRAMUSCULAR | Status: DC
  Filled 2013-09-15: qty 0.5

## 2013-09-15 MED ORDER — SODIUM CHLORIDE 0.9 % IV SOLN
INTRAVENOUS | Status: DC
  Administered 2013-09-15: 10 mL/h via INTRAVENOUS

## 2013-09-15 MED ORDER — HEPATITIS B VAC RECOMBINANT 5 MCG/0.5ML IJ SUSP
1.0000 mL | Freq: Once | INTRAMUSCULAR | Status: AC
  Administered 2013-09-15: 10 ug via INTRAMUSCULAR
  Filled 2013-09-15: qty 1

## 2013-09-15 MED ORDER — HEPATITIS A VACCINE 1440 EL U/ML IM SUSP
1.0000 mL | Freq: Once | INTRAMUSCULAR | Status: AC
  Administered 2013-09-15: 1440 [IU] via INTRAMUSCULAR
  Filled 2013-09-15: qty 1

## 2013-09-15 NOTE — Progress Notes (Signed)
PULMONARY / CRITICAL CARE MEDICINE   Name: William Bender MRN: [id number] DOB: 03/27/1966    ADMISSION DATE:  09/13/2013 CONSULTATION DATE:     Cindi CarbonEFERRING MD :  [name] PRIMARY SERVICE: [facility]/ PCCM  CHIEF COMPLAINT: sob  BRIEF PATIENT DESCRIPTION:  48 yo male never smoker admitted 3/17 with acute hypoxemic respiratory failure with bilateral infiltrates in setting of a new diagnosis of HIV.  SIGNIFICANT EVENTS / STUDIES:    LINES / TUBES:    CULTURES: BC x 2  09/13/13 >>> PCP DFA sputum 3/18 >> negative  ANTIBIOTICS: Bactrim IV >>   SUBJECTIVE:  Slept better last night, feels that breathing is a little better  VITAL SIGNS: Temp:  [97 F (36.1 C)-98.7 F (37.1 C)] 97.7 F (36.5 C) (03/19 0800) Pulse Rate:  [61-95] 81 (03/19 0700) Resp:  [25-37] 32 (03/19 0700) BP: (97-135)/(54-76) 111/54 mmHg (03/19 0400) SpO2:  [90 %-98 %] 97 % (03/19 0700) FiO2 (%):  [45 %] 45 % (03/19 0400) Weight:  [78.7 kg (173 lb 8 oz)] 78.7 kg (173 lb 8 oz) (03/19 0424) HEMODYNAMICS:   VENTILATOR SETTINGS: Vent Mode:  [-]  FiO2 (%):  [45 %] 45 % INTAKE / OUTPUT: Intake/Output     03/18 0701 - 03/19 0700 03/19 0701 - 03/20 0700   P.O. 280    IV Piggyback 1827.8    Total Intake(mL/kg) 2107.8 (26.8)    Urine (mL/kg/hr) 2750 (1.5) 300 (1.6)   Total Output 2750 300   Net -642.2 -300          PHYSICAL EXAMINATION:  Gen: anxious, tachypnic but speaking in full sentences and not in distress HEENT: NCAT, PERRL, OP with thrush PULM: Clear today CV: RRR, nl S1/S2 AB: BS+, soft, nontender Ext: warm, no edema Neuro: A&Ox4, maew  LABS:  CBC  Recent Labs Lab 09/13/13 1105  WBC 11.9*  HGB 12.4*  HCT 36.2*  PLT 189   Coag's  Recent Labs Lab 09/13/13 1105  APTT 34  INR 1.11   BMET  Recent Labs Lab 09/13/13 1105  NA 134*  K 3.9  CL 95*  CO2 23  BUN 15  CREATININE 0.93  GLUCOSE 120*   Electrolytes  Recent Labs Lab 09/13/13 1105  CALCIUM 8.8   Sepsis  Markers No results found for this basename: LATICACIDVEN, PROCALCITON, O2SATVEN,  in the last 168 hours ABG No results found for this basename: PHART, PCO2ART, PO2ART,  in the last 168 hours Liver Enzymes  Recent Labs Lab 09/13/13 1105  AST 64*  ALT 22  ALKPHOS 57  BILITOT 0.5  ALBUMIN 3.0*   Cardiac Enzymes  Recent Labs Lab 09/13/13 1105  PROBNP 193.8*   Glucose  Recent Labs Lab 09/14/13 1607 09/14/13 2032 09/14/13 2327 09/15/13 0420 09/15/13 0833  GLUCAP 149* 197* 156* 142* 169*    Imaging   CXR:  09/13/13 > bibasilar airspace disease 09/14/13 > bibasilar airspace disease unchanged    ASSESSMENT / PLAN:  1) Acute hypoxemic resp failure in pt with bilateral pulmonary infiltrates   Despite the negative sputum DFA, PCP remains highest on differential diagnosis; consider ddx includes : CMV, fungal infection, atypical mycobaterial disease and less likely other "non-HIV" causes of a similar picture > other virus (influenza, etc), neoplasm, eosinophilic pneumonia, AIP, NSIP (all seem less likely)  He appears to be feeling better with empiric PCP treatment; he would need intubation for bronchoscopy.    Plan:  -will continue empiric treatment for PCP  -send CMV -send urine histo  antigen -send serum LDH -if felt by ID we need a BAL, we are happy to accommodate -consider CT chest if no improvement by 3/20 -wean O2 as tolerated -check CBC/BMET today  2) New diagnosis of HIV/ AIDS; appreciate ID support -f/u ID recs -send other labs as above  3) Hypertension -continue clonidine  4) Best practice: Feeding: regular diet Analgesia: APAP prn Sedation: n/a Thromboprophylaxis: lovenox HOB >30 degrees Ulcer prophylaxis: ppi Glucose control: monitor poc glucose  Yolonda Kida PCCM Pager: [phone] Cell: [phone] If no response, call [phone]

## 2013-09-15 NOTE — Progress Notes (Addendum)
INFECTIOUS DISEASE PROGRESS NOTE  ID: Antavious Spanos is a 48 y.o. male with  Active Problems:   Acute respiratory failure  Subjective: Feels better. Denies rashes, problems with anbx  Abtx:  Anti-infectives   Start     Dose/Rate Route Frequency Ordered Stop   09/14/13 2200  azithromycin (ZITHROMAX) tablet 1,200 mg     1,200 mg Oral Weekly 09/14/13 1659     09/14/13 1800  fluconazole (DIFLUCAN) tablet 200 mg     200 mg Oral Daily 09/14/13 1659     09/13/13 1800  sulfamethoxazole-trimethoprim (BACTRIM) 403.2 mg in dextrose 5 % 500 mL IVPB     20 mg/kg/day  80.6 kg 350.1 mL/hr over 90 Minutes Intravenous 4 times per day 09/13/13 1732        Medications:  Scheduled: . aspirin EC  81 mg Oral Daily  . azithromycin  1,200 mg Oral Weekly  . cloNIDine  0.1 mg Oral TID  . dextromethorphan-guaiFENesin  2 tablet Oral BID  . enoxaparin (LOVENOX) injection  30 mg Subcutaneous Q24H  . fluconazole  200 mg Oral Daily  . methylPREDNISolone (SOLU-MEDROL) injection  80 mg Intravenous Q12H  . pantoprazole  40 mg Oral BID AC  . sulfamethoxazole-trimethoprim  20 mg/kg/day Intravenous 4 times per day  . tuberculin  5 Units Intradermal Once    Objective: Vital signs in last 24 hours: Temp:  [97 F (36.1 C)-98.7 F (37.1 C)] 97.7 F (36.5 C) (03/19 0800) Pulse Rate:  [61-91] 90 (03/19 1000) Resp:  [23-37] 23 (03/19 1000) BP: (97-135)/(54-75) 128/68 mmHg (03/19 1000) SpO2:  [90 %-98 %] 97 % (03/19 1000) FiO2 (%):  [45 %] 45 % (03/19 1000) Weight:  [78.7 kg (173 lb 8 oz)] 78.7 kg (173 lb 8 oz) (03/19 0424)   General appearance: alert, cooperative and no distress Resp: rhonchi bilaterally and mild  Cardio: regular rate and rhythm GI: normal findings: bowel sounds normal and soft, non-tender  Lab Results  Recent Labs  09/13/13 1105 09/15/13 1020  WBC 11.9* 20.4*  HGB 12.4* 11.8*  HCT 36.2* 33.1*  NA 134* 134*  K 3.9 4.1  CL 95* 98  CO2 23 19  BUN 15 17  CREATININE 0.93 0.88    Liver Panel  Recent Labs  09/13/13 1105  PROT 8.1  ALBUMIN 3.0*  AST 64*  ALT 22  ALKPHOS 57  BILITOT 0.5   Sedimentation Rate  Recent Labs  09/13/13 1105  ESRSEDRATE 96*   C-Reactive Protein No results found for this basename: CRP,  in the last 72 hours  Microbiology: Recent Results (from the past 240 hour(s))  CULTURE, BLOOD (ROUTINE X 2)     Status: None   Collection Time    09/13/13 11:05 AM      Result Value Ref Range Status   Specimen Description BLOOD LEFT ARM   Final   Special Requests BOTTLES DRAWN AEROBIC AND ANAEROBIC 5CC EACH   Final   Culture  Setup Time     Final   Value: 09/13/2013 14:03     Performed at [facility]   Culture     Final   Value:        BLOOD CULTURE RECEIVED NO GROWTH TO DATE CULTURE WILL BE HELD FOR 5 DAYS BEFORE ISSUING A FINAL NEGATIVE REPORT     Performed at [facility]   Report Status PENDING   Incomplete  CULTURE, BLOOD (ROUTINE X 2)     Status: None   Collection  Time    09/13/13 11:15 AM      Result Value Ref Range Status   Specimen Description BLOOD RIGHT ARM   Final   Special Requests BOTTLES DRAWN AEROBIC AND ANAEROBIC 10CC EACH   Final   Culture  Setup Time     Final   Value: 09/13/2013 14:03     Performed at [facility]   Culture     Final   Value:        BLOOD CULTURE RECEIVED NO GROWTH TO DATE CULTURE WILL BE HELD FOR 5 DAYS BEFORE ISSUING A FINAL NEGATIVE REPORT     Performed at [facility]   Report Status PENDING   Incomplete  MRSA PCR SCREENING     Status: Abnormal   Collection Time    09/13/13  8:17 PM      Result Value Ref Range Status   MRSA by PCR INVALID RESULTS, SPECIMEN SENT FOR CULTURE (*) NEGATIVE Corrected   Comment: RESULT CALLED TO, READ BACK BY AND VERIFIED WITH:     SPOKE WITH BANKS,T RN [phone] COVINGTON,N     CORRECTED ON 03/18 AT 1155: PREVIOUSLY REPORTED AS RESULT CALLED TO, READ BACK BY AND VERIFIED WITH: SPOKE WITH BANKS,T RN [phone] COVINGTON,N         The GeneXpert MRSA Assay (FDA approved for NASAL specimens only), is one component of a      comprehensive MRSA colonization surveillance program. It is not intended to diagnose MRSA infection nor to guide or monitor treatment for MRSA infections.  GC/CHLAMYDIA PROBE AMP     Status: None   Collection Time    09/14/13  9:15 PM      Result Value Ref Range Status   CT Probe RNA NEGATIVE  NEGATIVE Final   GC Probe RNA NEGATIVE  NEGATIVE Final   Comment: (NOTE)                                                                                               **Normal Reference Range: Negative**          Assay performed using the Gen-Probe APTIMA COMBO2 (R) Assay.     Acceptable specimen types for this assay include APTIMA Swabs (Unisex,     endocervical, urethral, or vaginal), first void urine, and ThinPrep     liquid based cytology samples.     Performed at [place] [facility]     Status: None   Collection Time    09/14/13 10:15 PM      Result Value Ref Range Status   Specimen Source-PJSRC SPUTUM   Final   Pneumocystis jiroveci Ag NEGATIVE   Final   Comment: Performed at [place]    Studies/Results: Dg Chest Port 1 View  09/15/2013   CLINICAL DATA:  Shortness of breath  EXAM: PORTABLE CHEST - 1 VIEW  COMPARISON:  September 14, 2013  FINDINGS: The heart size and mediastinal contours are stable. The heart size is mildly enlarged. There is increased pulmonary interstitium bilaterally unchanged. The visualized skeletal structures are  stable.  IMPRESSION: Persistent cardiomegaly with diffuse increased pulmonary interstitium unchanged compared to prior exam. Differential diagnosis includes pulmonary edema versus atypical pneumonia.   Electronically Signed   By: Abelardo Diesel M.D.   On: 09/15/2013 10:37   Dg Chest Port 1 View  09/14/2013   CLINICAL DATA:  Shortness of breath.  EXAM: PORTABLE CHEST - 1 VIEW  COMPARISON:  DG CHEST 2 VIEW dated  09/13/2013; CT ANGIO CHEST W/CM &/OR WO/CM dated 09/02/2013  FINDINGS: Mild cardiomegaly with indistinct pulmonary vasculature and bilateral airspace opacities, mildly increased from the prior exam.  Degenerative spurring both glenohumeral joints.  IMPRESSION: 1. Cardiomegaly with indistinct pulmonary vasculature in bilateral indistinct airspace opacities, mildly worsened from prior. I have reviewed the patient's chart and note the recent diagnosis of HIV with low lymphocytes, grade 1 diastolic dysfunction on the echocardiography, and mildly elevated BNP. From a purely radiographic standpoint, the appearance on today's exam could be a manifestation of mild pulmonary edema or atypical pneumonia with incidental mild cardiomegaly.   Electronically Signed   By: Sherryl Barters M.D.   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 Expectorated PCP stain is ~ 60% sensitive and his picture and CXR fits PCP well.  His CXR is improving, reviewed with CCM.  If he continues to do well, will start him on ART soon, still waiting his HLA testing and HIV genotype.  HIV case manager called him this AM and her defered their assistance (had requested yesterday).  His wife Ab test was (-) yesterday. She will need repeat tests in 6 weeks 6 months (I explained this to her).  She states they have not been sexually active for sometime. I encouraged condom use in the future. Could also consider offering her PrEP She states that he had several heterosexual affairs in 2005 when he believed they would divorce while separated.           Bobby Rumpf Infectious Diseases (pager) [phone] [email or web address] 09/15/2013, 11:34 AM  LOS: 2 days

## 2013-09-16 LAB — MRSA CULTURE

## 2013-09-16 LAB — GLUCOSE, CAPILLARY
GLUCOSE-CAPILLARY: 141 mg/dL — AB (ref 70–99)
GLUCOSE-CAPILLARY: 175 mg/dL — AB (ref 70–99)
GLUCOSE-CAPILLARY: 183 mg/dL — AB (ref 70–99)
Glucose-Capillary: 146 mg/dL — ABNORMAL HIGH (ref 70–99)
Glucose-Capillary: 133 mg/dL — ABNORMAL HIGH (ref 70–99)

## 2013-09-16 LAB — HIV 1/2 CONFIRMATION
HIV 2 AB: NEGATIVE
HIV-1 antibody: POSITIVE

## 2013-09-16 MED ORDER — PREDNISONE 20 MG PO TABS
40.0000 mg | ORAL_TABLET | Freq: Every day | ORAL | Status: DC
  Administered 2013-09-17 – 2013-09-20 (×4): 40 mg via ORAL
  Filled 2013-09-16 (×5): qty 2

## 2013-09-16 MED ORDER — DOCUSATE SODIUM 100 MG PO CAPS
100.0000 mg | ORAL_CAPSULE | Freq: Two times a day (BID) | ORAL | Status: DC
  Administered 2013-09-16 – 2013-09-19 (×6): 100 mg via ORAL
  Filled 2013-09-16 (×7): qty 1

## 2013-09-16 NOTE — Progress Notes (Signed)
PULMONARY / CRITICAL CARE MEDICINE   Name: William Bender MRN: [id number] DOB: 07/10/1965    ADMISSION DATE:  09/13/2013 CONSULTATION DATE:     Cindi CarbonEFERRING MD :  [name] PRIMARY SERVICE: Wert/ PCCM  CHIEF COMPLAINT: sob  BRIEF PATIENT DESCRIPTION:   48 y/o male never smoker admitted 3/17 with acute hypoxemic respiratory failure with bilateral infiltrates in setting of a new diagnosis of HIV.  SIGNIFICANT EVENTS / STUDIES:  3/17 - Admit with hypoxemic resp fx, bilateral infiltrates 3/20 - significant clinical improvement, 2L O2    CULTURES: BC x 2  09/13/13 >>> PCP DFA sputum 3/18 >> negative RVP 3/18>>>neg GC 3/18>>>neg  ANTIBIOTICS: Bactrim IV >> Azithro 3/18>> Diflucan 3/19>>  SUBJECTIVE: Pt reports he feels much better, hopeful to get out of ICU.  Wants to walk.   VITAL SIGNS: Temp:  [97.7 F (36.5 C)-98.8 F (37.1 C)] 97.7 F (36.5 C) (03/20 0900) Pulse Rate:  [54-93] 68 (03/20 0330) Resp:  [21-33] 21 (03/20 0330) BP: (114-130)/(64-72) 124/68 mmHg (03/20 0911) SpO2:  [88 %-96 %] 95 % (03/20 0330) Weight:  [173 lb 8 oz (78.7 kg)] 173 lb 8 oz (78.7 kg) (03/20 0553)  INTAKE / OUTPUT: Intake/Output     03/19 0701 - 03/20 0700 03/20 0701 - 03/21 0700   P.O. 720    I.V. (mL/kg) 113.5 (1.4)    Other 120    IV Piggyback 1525.2    Total Intake(mL/kg) 2478.7 (31.5)    Urine (mL/kg/hr) 2525 (1.3) 500 (1.9)   Total Output 2525 500   Net -46.3 -500          PHYSICAL EXAMINATION: Gen: no acute distress, sitting up in chair HEENT: NCAT, PERRL, OP with thrush PULM: Clear today CV: RRR, nl S1/S2 AB: BS+, soft, nontender Ext: warm, no edema Neuro: A&Ox4, maew  LABS:  CBC  Recent Labs Lab 09/13/13 1105 09/15/13 1020  WBC 11.9* 20.4*  HGB 12.4* 11.8*  HCT 36.2* 33.1*  PLT 189 244   Coag's  Recent Labs Lab 09/13/13 1105  APTT 34  INR 1.11   BMET  Recent Labs Lab 09/13/13 1105 09/15/13 1020  NA 134* 134*  K 3.9 4.1  CL 95* 98  CO2 23 19   BUN 15 17  CREATININE 0.93 0.88  GLUCOSE 120* 130*   Electrolytes  Recent Labs Lab 09/13/13 1105 09/15/13 1020  CALCIUM 8.8 8.8   Liver Enzymes  Recent Labs Lab 09/13/13 1105  AST 64*  ALT 22  ALKPHOS 57  BILITOT 0.5  ALBUMIN 3.0*   Cardiac Enzymes  Recent Labs Lab 09/13/13 1105  PROBNP 193.8*   Glucose  Recent Labs Lab 09/15/13 1211 09/15/13 1607 09/15/13 1957 09/15/13 2322 09/16/13 0325 09/16/13 0809  GLUCAP 159* 190* 164* 146* 141* 133*    Imaging CXR:  3/19 > basilar airspace disease, improvement    ASSESSMENT / PLAN:  Acute hypoxemic resp failure in pt with bilateral pulmonary infiltrates  Despite the negative sputum DFA, PCP remains highest on differential diagnosis; consider ddx includes : CMV, fungal infection, atypical mycobaterial disease and less likely other "non-HIV" causes of a similar picture > other virus (influenza, etc), neoplasm, eosinophilic pneumonia, AIP, NSIP (all seem less likely).  Improved with empiric rx for PCP.   Plan:  -Continue empiric treatment for PCP  -await CMV -await urine histo antigen -serum LDH 631 -no BAL needed at this time (per ID) -wean O2 as tolerated -check CBC/BMET today -ambulate -steroids as ordered  New diagnosis of  AIDS - appreciate ID support Thrush  Plan: -f/u ID recs -send other labs as above  Hypertension -continue clonidine  Best practice: Feeding: regular diet Analgesia: APAP prn Sedation: n/a Thromboprophylaxis: lovenox HOB >30 degrees Ulcer prophylaxis: ppi Glucose control: monitor poc glucose   Transfer to medical floor.  Ambulate.  Continue to wean O2 for sats > 93%  Canary Brim, NP-C [facility] Pgr: [phone] or [phone]  Attending:  I have seen and examined the patient with nurse practitioner/resident and agree with the note above.   Doing great Wean steroids Await HIV genotyping  Yolonda Kida PCCM Pager:  [phone] Cell: [phone] If no response, call [phone]

## 2013-09-16 NOTE — Progress Notes (Signed)
INFECTIOUS DISEASE PROGRESS NOTE  ID: William Bender is a 48 y.o. male with  Active Problems:   Acute respiratory failure  Subjective: Without complaints, feels better. Breathing better.   Abtx:  Anti-infectives   Start     Dose/Rate Route Frequency Ordered Stop   09/14/13 2200  azithromycin (ZITHROMAX) tablet 1,200 mg     1,200 mg Oral Weekly 09/14/13 1659     09/14/13 1800  fluconazole (DIFLUCAN) tablet 200 mg     200 mg Oral Daily 09/14/13 1659     09/13/13 1800  sulfamethoxazole-trimethoprim (BACTRIM) 403.2 mg in dextrose 5 % 500 mL IVPB     20 mg/kg/day  80.6 kg 350.1 mL/hr over 90 Minutes Intravenous 4 times per day 09/13/13 1732        Medications:  Scheduled: . aspirin EC  81 mg Oral Daily  . azithromycin  1,200 mg Oral Weekly  . cloNIDine  0.1 mg Oral TID  . dextromethorphan-guaiFENesin  2 tablet Oral BID  . enoxaparin (LOVENOX) injection  30 mg Subcutaneous Q24H  . fluconazole  200 mg Oral Daily  . pantoprazole  40 mg Oral BID AC  . [START ON 09/17/2013] predniSONE  40 mg Oral Q breakfast  . sulfamethoxazole-trimethoprim  20 mg/kg/day Intravenous 4 times per day  . tuberculin  5 Units Intradermal Once    Objective: Vital signs in last 24 hours: Temp:  [97.5 F (36.4 C)-98.8 F (37.1 C)] 97.6 F (36.4 C) (03/20 1250) Pulse Rate:  [54-93] 70 (03/20 1250) Resp:  [19-34] 20 (03/20 1250) BP: (122-137)/(68-75) 129/73 mmHg (03/20 1250) SpO2:  [88 %-97 %] 97 % (03/20 1200) Weight:  [78.7 kg (173 lb 8 oz)] 78.7 kg (173 lb 8 oz) (03/20 0553)   General appearance: alert, cooperative and no distress Resp: diminished breath sounds bilaterally Cardio: regular rate and rhythm GI: normal findings: bowel sounds normal and soft, non-tender  Lab Results  Recent Labs  09/15/13 1020  WBC 20.4*  HGB 11.8*  HCT 33.1*  NA 134*  K 4.1  CL 98  CO2 19  BUN 17  CREATININE 0.88   Liver Panel No results found for this basename: PROT, ALBUMIN, AST, ALT, ALKPHOS,  BILITOT, BILIDIR, IBILI,  in the last 72 hours Sedimentation Rate No results found for this basename: ESRSEDRATE,  in the last 72 hours C-Reactive Protein No results found for this basename: CRP,  in the last 72 hours  Microbiology: Recent Results (from the past 240 hour(s))  CULTURE, BLOOD (ROUTINE X 2)     Status: None   Collection Time    09/13/13 11:05 AM      Result Value Ref Range Status   Specimen Description BLOOD LEFT ARM   Final   Special Requests BOTTLES DRAWN AEROBIC AND ANAEROBIC 5CC EACH   Final   Culture  Setup Time     Final   Value: 09/13/2013 14:03     Performed at [facility]   Culture     Final   Value:        BLOOD CULTURE RECEIVED NO GROWTH TO DATE CULTURE WILL BE HELD FOR 5 DAYS BEFORE ISSUING A FINAL NEGATIVE REPORT     Performed at [facility]   Report Status PENDING   Incomplete  CULTURE, BLOOD (ROUTINE X 2)     Status: None   Collection Time    09/13/13 11:15 AM      Result Value Ref Range Status   Specimen Description BLOOD RIGHT  ARM   Final   Special Requests BOTTLES DRAWN AEROBIC AND ANAEROBIC 10CC EACH   Final   Culture  Setup Time     Final   Value: 09/13/2013 14:03     Performed at [facility]   Culture     Final   Value:        BLOOD CULTURE RECEIVED NO GROWTH TO DATE CULTURE WILL BE HELD FOR 5 DAYS BEFORE ISSUING A FINAL NEGATIVE REPORT     Performed at [facility]   Report Status PENDING   Incomplete  MRSA PCR SCREENING     Status: Abnormal   Collection Time    09/13/13  8:17 PM      Result Value Ref Range Status   MRSA by PCR INVALID RESULTS, SPECIMEN SENT FOR CULTURE (*) NEGATIVE Corrected   Comment: RESULT CALLED TO, READ BACK BY AND VERIFIED WITH:     SPOKE WITH BANKS,T RN [phone] COVINGTON,N     CORRECTED ON 03/18 AT 1155: PREVIOUSLY REPORTED AS RESULT CALLED TO, READ BACK BY AND VERIFIED WITH: SPOKE WITH BANKS,T RN [phone] COVINGTON,N        The GeneXpert MRSA Assay (FDA approved for NASAL  specimens only), is one component of a      comprehensive MRSA colonization surveillance program. It is not intended to diagnose MRSA infection nor to guide or monitor treatment for MRSA infections.  MRSA CULTURE     Status: None   Collection Time    09/13/13  8:17 PM      Result Value Ref Range Status   Specimen Description NASOPHARYNGEAL   Final   Special Requests NONE   Final   Culture     Final   Value: NOMRSA     Performed at [facility]   Report Status 09/16/2013 FINAL   Final  RESPIRATORY VIRUS PANEL     Status: None   Collection Time    09/14/13 12:20 PM      Result Value Ref Range Status   Source - RVPAN NASAL SWAB   Corrected   Comment: CORRECTED ON 03/19 AT 1848: PREVIOUSLY REPORTED AS NASAL SWAB   Respiratory Syncytial Virus A NOT DETECTED   Final   Respiratory Syncytial Virus B NOT DETECTED   Final   Influenza A NOT DETECTED   Final   Influenza B NOT DETECTED   Final   Parainfluenza 1 NOT DETECTED   Final   Parainfluenza 2 NOT DETECTED   Final   Parainfluenza 3 NOT DETECTED   Final   Metapneumovirus NOT DETECTED   Final   Rhinovirus NOT DETECTED   Final   Adenovirus NOT DETECTED   Final   Influenza A H1 NOT DETECTED   Final   Influenza A H3 NOT DETECTED   Final   Comment: (NOTE)           Normal Reference Range for each Analyte: NOT DETECTED     Testing performed using the Luminex xTAG Respiratory Viral Panel test     kit.     This test was developed and its performance characteristics determined     by [facility]. It has not been cleared or approved by the [place]     Food and Drug Administration. This test is used for clinical purposes.     It should not be regarded as investigational or for research. This     laboratory is certified under the Clinical Laboratory Improvement  Amendments of 1988 (CLIA) as qualified to perform high complexity     clinical laboratory testing.     Performed at [facility]      Status: None   Collection Time    09/14/13  9:15 PM      Result Value Ref Range Status   CT Probe RNA NEGATIVE  NEGATIVE Final   GC Probe RNA NEGATIVE  NEGATIVE Final   Comment: (NOTE)                                                                                               **Normal Reference Range: Negative**          Assay performed using the Gen-Probe APTIMA COMBO2 (R) Assay.     Acceptable specimen types for this assay include APTIMA Swabs (Unisex,     endocervical, urethral, or vaginal), first void urine, and ThinPrep     liquid based cytology samples.     Performed at [facility]     Status: None   Collection Time    09/14/13 10:15 PM      Result Value Ref Range Status   Specimen Source-PJSRC SPUTUM   Final   Pneumocystis jiroveci Ag NEGATIVE   Final   Comment: Performed at [place] [facility]    Studies/Results: Dg Chest Port 1 View  09/15/2013   CLINICAL DATA:  Shortness of breath  EXAM: PORTABLE CHEST - 1 VIEW  COMPARISON:  September 14, 2013  FINDINGS: The heart size and mediastinal contours are stable. The heart size is mildly enlarged. There is increased pulmonary interstitium bilaterally unchanged. The visualized skeletal structures are stable.  IMPRESSION: Persistent cardiomegaly with diffuse increased pulmonary interstitium unchanged compared to prior exam. Differential diagnosis includes pulmonary edema versus atypical pneumonia.   Electronically Signed   By: Abelardo Diesel M.D.   On: 09/15/2013 10:37     Assessment/Plan: AIDS Suspected PCP  Await his HIV genotype and his HLA testing to start his ART.  He appears to be improving. Will be in hospital til he is off O2.  He has f/u appt with me on 3-31 at 9AM.  Continue bactrim, azithro. Continue diflucan to day 10.  Dr Linus Salmons available if questions over weekend.   Total days of antibiotics: 3 bactrim, fluconazole, azithromycin          Bobby Rumpf Infectious Diseases (pager) [phone] [email or web address] 09/16/2013, 3:36 PM  LOS: 3 days

## 2013-09-16 NOTE — Progress Notes (Signed)
Patient transferring to room 1521.  Report called to Domingo CockingEster, Charity fundraiserN.  Patient to travel by wheelchair.  Will continue to monitor.

## 2013-09-17 LAB — GLUCOSE, CAPILLARY
Glucose-Capillary: 154 mg/dL — ABNORMAL HIGH (ref 70–99)
Glucose-Capillary: 137 mg/dL — ABNORMAL HIGH (ref 70–99)

## 2013-09-17 MED ORDER — POLYETHYLENE GLYCOL 3350 17 G PO PACK
17.0000 g | PACK | Freq: Every day | ORAL | Status: DC | PRN
  Administered 2013-09-17 – 2013-09-18 (×2): 17 g via ORAL
  Filled 2013-09-17 (×2): qty 1

## 2013-09-17 NOTE — Progress Notes (Signed)
PULMONARY / CRITICAL CARE MEDICINE   Name: William Bender MRN: [id number] DOB: 02-25-1966    ADMISSION DATE:  09/13/2013 CONSULTATION DATE:     [name] MD :  [name] PRIMARY SERVICE: Wert/ PCCM  CHIEF COMPLAINT: sob  BRIEF PATIENT DESCRIPTION:   48 y/o male never smoker admitted 3/17 with acute hypoxemic respiratory failure with bilateral infiltrates in setting of a new diagnosis of HIV.  SIGNIFICANT EVENTS / STUDIES:  3/17 - Admit with hypoxemic resp fx, bilateral infiltrates 3/20 - significant clinical improvement, 2L O2    CULTURES: BC x 2  09/13/13 >>> PCP DFA sputum 3/18 >> negative RVP 3/18>>>neg GC 3/18>>>neg  ANTIBIOTICS: Bactrim IV >> Azithro 3/18>> Diflucan 3/19>>  MEDS SCHED:  . aspirin EC  81 mg Oral Daily  . azithromycin  1,200 mg Oral Weekly  . cloNIDine  0.1 mg Oral TID  . dextromethorphan-guaiFENesin  2 tablet Oral BID  . docusate sodium  100 mg Oral BID  . enoxaparin (LOVENOX) injection  30 mg Subcutaneous Q24H  . fluconazole  200 mg Oral Daily  . pantoprazole  40 mg Oral BID AC  . predniSONE  40 mg Oral Q breakfast  . sulfamethoxazole-trimethoprim  20 mg/kg/day Intravenous 4 times per day    SUBJECTIVE: Pt reports he feels much better, Wants to walk.   VITAL SIGNS: Temp:  [96.7 F (35.9 C)-97.6 F (36.4 C)] 97.3 F (36.3 C) (03/21 0519) Pulse Rate:  [68-71] 68 (03/21 0519) Resp:  [18-20] 20 (03/21 0519) BP: (126-131)/(65-77) 129/71 mmHg (03/21 0519) SpO2:  [96 %-98 %] 97 % (03/21 0519) Weight:  [76.567 kg (168 lb 12.8 oz)] 76.567 kg (168 lb 12.8 oz) (03/21 0943)  INTAKE / OUTPUT: Intake/Output     03/20 0701 - 03/21 0700 03/21 0701 - 03/22 0700   P.O. 360    I.V. (mL/kg) 80 (1)    Other     IV Piggyback 1575.6    Total Intake(mL/kg) 2015.6 (25.6)    Urine (mL/kg/hr) 3225 (1.7) 600 (1.5)   Total Output 3225 600   Net -1209.4 -600          PHYSICAL EXAMINATION: Gen: no acute distress, sitting up in chair HEENT: NCAT, PERRL, OP  with thrush PULM: Clear today CV: RRR, nl S1/S2 AB: BS+, soft, nontender Ext: warm, no edema Neuro: A&Ox4, maew  LABS:  CBC  Recent Labs Lab 09/13/13 1105 09/15/13 1020  WBC 11.9* 20.4*  HGB 12.4* 11.8*  HCT 36.2* 33.1*  PLT 189 244   Coag's  Recent Labs Lab 09/13/13 1105  APTT 34  INR 1.11   BMET  Recent Labs Lab 09/13/13 1105 09/15/13 1020  NA 134* 134*  K 3.9 4.1  CL 95* 98  CO2 23 19  BUN 15 17  CREATININE 0.93 0.88  GLUCOSE 120* 130*   Electrolytes  Recent Labs Lab 09/13/13 1105 09/15/13 1020  CALCIUM 8.8 8.8   Liver Enzymes  Recent Labs Lab 09/13/13 1105  AST 64*  ALT 22  ALKPHOS 57  BILITOT 0.5  ALBUMIN 3.0*   Cardiac Enzymes  Recent Labs Lab 09/13/13 1105  PROBNP 193.8*   Glucose  Recent Labs Lab 09/15/13 2322 09/16/13 0325 09/16/13 0809 09/16/13 1130 09/16/13 2132 09/17/13 0821  GLUCAP 146* 141* 133* 175* 183* 154*    Imaging CXR:  3/19 > basilar airspace disease, improvement    ASSESSMENT / PLAN:  Acute hypoxemic resp failure in pt with bilateral pulmonary infiltrates  Despite the negative sputum DFA, PCP remains  highest on differential diagnosis; consider ddx includes : CMV, fungal infection, atypical mycobaterial disease and less likely other "non-HIV" causes of a similar picture > other virus (influenza, etc), neoplasm, eosinophilic pneumonia, AIP, NSIP (all seem less likely).  Improved with empiric rx for PCP.   Plan:  -Continue empiric treatment for PCP  -await CMV -await urine histo antigen -serum LDH 631 -no BAL needed at this time (per ID) -wean O2 as tolerated -check CBC/BMET today -ambulate -steroids as ordered  New diagnosis of AIDS - appreciate ID support Thrush  Plan: -f/u ID recs -send other labs as above  Hypertension -continue clonidine  Best practice: Feeding: regular diet Analgesia: APAP prn Sedation: n/a Thromboprophylaxis: lovenox HOB >30 degrees Ulcer prophylaxis:  ppi Glucose control: monitor poc glucose    [name] M 09/17/13 @ 12:10PM [place] [facility]{ulmonary #[phone]

## 2013-09-18 LAB — GLUCOSE, CAPILLARY
GLUCOSE-CAPILLARY: 113 mg/dL — AB (ref 70–99)
GLUCOSE-CAPILLARY: 111 mg/dL — AB (ref 70–99)
Glucose-Capillary: 82 mg/dL (ref 70–99)
Glucose-Capillary: 103 mg/dL — ABNORMAL HIGH (ref 70–99)
Glucose-Capillary: 165 mg/dL — ABNORMAL HIGH (ref 70–99)

## 2013-09-18 MED ORDER — MAGNESIUM HYDROXIDE 400 MG/5ML PO SUSP: 30.0000 mL | Freq: Every day | ORAL | Status: DC | PRN

## 2013-09-18 NOTE — Progress Notes (Signed)
PULMONARY / CRITICAL CARE MEDICINE   Name: William Bender MRN: [id number] DOB: 03/17/1966    ADMISSION DATE:  09/13/2013 CONSULTATION DATE:     William CarbonEFERRING MD :  William HazelLisa Bender PRIMARY SERVICE: Wert/ PCCM  CHIEF COMPLAINT: sob  BRIEF PATIENT DESCRIPTION:   48 y/o male never smoker admitted 3/17 with acute hypoxemic respiratory failure with bilateral infiltrates in setting of a new diagnosis of HIV.  SIGNIFICANT EVENTS / STUDIES:  3/17 - Admit with hypoxemic resp fx, bilateral infiltrates 3/20 - significant clinical improvement, 2L O2    CULTURES: BC x 2  09/13/13 >>> PCP DFA sputum 3/18 >> negative RVP 3/18>>>neg GC 3/18>>>neg  ANTIBIOTICS: Bactrim IV >> Azithro 3/18>> Diflucan 3/19>>  MEDS SCHED:  . aspirin EC  81 mg Oral Daily  . azithromycin  1,200 mg Oral Weekly  . cloNIDine  0.1 mg Oral TID  . dextromethorphan-guaiFENesin  2 tablet Oral BID  . docusate sodium  100 mg Oral BID  . enoxaparin (LOVENOX) injection  30 mg Subcutaneous Q24H  . fluconazole  200 mg Oral Daily  . pantoprazole  40 mg Oral BID AC  . predniSONE  40 mg Oral Q breakfast  . sulfamethoxazole-trimethoprim  20 mg/kg/day Intravenous 4 times per day    SUBJECTIVE: Pt reports he feels much better, Wants to walk.   VITAL SIGNS: Temp:  [97.4 F (36.3 C)-98 F (36.7 C)] 98 F (36.7 C) (03/22 0648) Pulse Rate:  [67-94] 94 (03/22 0648) Resp:  [20] 20 (03/22 0648) BP: (118-130)/(60-78) 130/78 mmHg (03/22 0939) SpO2:  [93 %-97 %] 93 % (03/22 0648) Weight:  [79.2 kg (174 lb 9.7 oz)] 79.2 kg (174 lb 9.7 oz) (03/22 0648)  INTAKE / OUTPUT: Intake/Output     03/21 0701 - 03/22 0700 03/22 0701 - 03/23 0700   P.O. 240 0   I.V. (mL/kg)     IV Piggyback     Total Intake(mL/kg) 240 (3)    Urine (mL/kg/hr) 1600 (0.8) 200 (0.4)   Total Output 1600 200   Net -1360 -200        Stool Occurrence       PHYSICAL EXAMINATION: Gen: no acute distress, sitting up in chair HEENT: NCAT, PERRL, OP with thrush PULM:  Clear today CV: RRR, nl S1/S2 AB: BS+, soft, nontender Ext: warm, no edema Neuro: A&Ox4, maew  LABS:  CBC  Recent Labs Lab 09/13/13 1105 09/15/13 1020  WBC 11.9* 20.4*  HGB 12.4* 11.8*  HCT 36.2* 33.1*  PLT 189 244   Coag's  Recent Labs Lab 09/13/13 1105  APTT 34  INR 1.11   BMET  Recent Labs Lab 09/13/13 1105 09/15/13 1020  NA 134* 134*  K 3.9 4.1  CL 95* 98  CO2 23 19  BUN 15 17  CREATININE 0.93 0.88  GLUCOSE 120* 130*   Electrolytes  Recent Labs Lab 09/13/13 1105 09/15/13 1020  CALCIUM 8.8 8.8   Liver Enzymes  Recent Labs Lab 09/13/13 1105  AST 64*  ALT 22  ALKPHOS 57  BILITOT 0.5  ALBUMIN 3.0*   Cardiac Enzymes  Recent Labs Lab 09/13/13 1105  PROBNP 193.8*   Glucose  Recent Labs Lab 09/17/13 0821 09/17/13 1204 09/17/13 1700 09/17/13 2240 09/18/13 0757 09/18/13 1146  GLUCAP 154* 137* 113* 111* 82 103*    Imaging CXR:  3/19 > basilar airspace disease, improvement    ASSESSMENT / PLAN:  Acute hypoxemic resp failure in pt with bilateral pulmonary infiltrates  Despite the negative sputum DFA, PCP remains  highest on differential diagnosis; consider ddx includes : CMV, fungal infection, atypical mycobaterial disease and less likely other "non-HIV" causes of a similar picture > other virus (influenza, etc), neoplasm, eosinophilic pneumonia, AIP, NSIP (all seem less likely).  Improved with empiric rx for PCP.   Plan:  -Continue empiric treatment for PCP (Bacterim Qid) -continue Rx per ID w/ Diflucan, & Zithromax weekly  -await CMV -await urine histo antigen -serum LDH 631 -no BAL needed at this time (per ID) -wean O2 as tolerated -check CBC/BMET today -ambulate -steroids as ordered=> 3/22 wean Pred to 20mg  Qam... - f/u CXR 3/23 AM  New diagnosis of AIDS - appreciate ID support Thrush  Plan: -f/u ID recs -send other labs as above  Hypertension -continue clonidine  Best practice: Feeding: regular  diet Analgesia: APAP prn Sedation: n/a Thromboprophylaxis: lovenox HOB >30 degrees Ulcer prophylaxis: ppi Glucose control: monitor poc glucose    William Bender M 09/18/13 @ 12:44PM [place] [facility]{ulmonary #[phone]

## 2013-09-19 ENCOUNTER — Inpatient Hospital Stay (HOSPITAL_COMMUNITY): Payer: BC Managed Care – PPO

## 2013-09-19 DIAGNOSIS — J9600 Acute respiratory failure, unspecified whether with hypoxia or hypercapnia: Secondary | ICD-10-CM

## 2013-09-19 DIAGNOSIS — R079 Chest pain, unspecified: Secondary | ICD-10-CM

## 2013-09-19 DIAGNOSIS — Z21 Asymptomatic human immunodeficiency virus [HIV] infection status: Secondary | ICD-10-CM

## 2013-09-19 LAB — CULTURE, BLOOD (ROUTINE X 2)
Culture: NO GROWTH
Culture: NO GROWTH

## 2013-09-19 LAB — CBC WITH DIFFERENTIAL/PLATELET
Basophils Absolute: 0 10*3/uL (ref 0.0–0.1)
Basophils Relative: 0 % (ref 0–1)
EOS ABS: 0.1 10*3/uL (ref 0.0–0.7)
EOS PCT: 1 % (ref 0–5)
HEMATOCRIT: 30.9 % — AB (ref 39.0–52.0)
Hemoglobin: 10.9 g/dL — ABNORMAL LOW (ref 13.0–17.0)
Lymphocytes Relative: 5 % — ABNORMAL LOW (ref 12–46)
Lymphs Abs: 0.5 10*3/uL — ABNORMAL LOW (ref 0.7–4.0)
MCH: 30.2 pg (ref 26.0–34.0)
MCHC: 35.3 g/dL (ref 30.0–36.0)
MCV: 85.6 fL (ref 78.0–100.0)
MONO ABS: 0.3 10*3/uL (ref 0.1–1.0)
Monocytes Relative: 3 % (ref 3–12)
Neutro Abs: 9.7 10*3/uL — ABNORMAL HIGH (ref 1.7–7.7)
Neutrophils Relative %: 91 % — ABNORMAL HIGH (ref 43–77)
Platelets: 212 10*3/uL (ref 150–400)
RBC: 3.61 MIL/uL — ABNORMAL LOW (ref 4.22–5.81)
RDW: 13.7 % (ref 11.5–15.5)
WBC: 10.7 10*3/uL — ABNORMAL HIGH (ref 4.0–10.5)

## 2013-09-19 LAB — COMPREHENSIVE METABOLIC PANEL
ALT: 15 U/L (ref 0–53)
AST: 30 U/L (ref 0–37)
Albumin: 2.4 g/dL — ABNORMAL LOW (ref 3.5–5.2)
Alkaline Phosphatase: 45 U/L (ref 39–117)
BUN: 20 mg/dL (ref 6–23)
CALCIUM: 8.7 mg/dL (ref 8.4–10.5)
CO2: 22 mEq/L (ref 19–32)
CREATININE: 1 mg/dL (ref 0.50–1.35)
Chloride: 95 mEq/L — ABNORMAL LOW (ref 96–112)
GFR calc non Af Amer: 88 mL/min — ABNORMAL LOW (ref >90)
GLUCOSE: 87 mg/dL (ref 70–99)
Potassium: 4.3 mEq/L (ref 3.7–5.3)
Sodium: 130 mEq/L — ABNORMAL LOW (ref 137–147)
TOTAL PROTEIN: 6.5 g/dL (ref 6.0–8.3)
Total Bilirubin: 0.3 mg/dL (ref 0.3–1.2)

## 2013-09-19 LAB — GLUCOSE, CAPILLARY
GLUCOSE-CAPILLARY: 96 mg/dL (ref 70–99)
Glucose-Capillary: 182 mg/dL — ABNORMAL HIGH (ref 70–99)
Glucose-Capillary: 161 mg/dL — ABNORMAL HIGH (ref 70–99)
Glucose-Capillary: 115 mg/dL — ABNORMAL HIGH (ref 70–99)

## 2013-09-19 MED ORDER — SENNOSIDES-DOCUSATE SODIUM 8.6-50 MG PO TABS
2.0000 | ORAL_TABLET | Freq: Two times a day (BID) | ORAL | Status: DC
  Administered 2013-09-19 – 2013-09-20 (×3): 2 via ORAL
  Filled 2013-09-19 (×4): qty 2

## 2013-09-19 MED ORDER — FLUCONAZOLE 100 MG PO TABS
100.0000 mg | ORAL_TABLET | Freq: Every day | ORAL | Status: DC
  Administered 2013-09-20: 100 mg via ORAL
  Filled 2013-09-19: qty 1

## 2013-09-19 MED ORDER — SULFAMETHOXAZOLE-TMP DS 800-160 MG PO TABS
2.0000 | ORAL_TABLET | Freq: Three times a day (TID) | ORAL | Status: DC
  Administered 2013-09-19 – 2013-09-20 (×4): 2 via ORAL
  Filled 2013-09-19 (×7): qty 2

## 2013-09-19 NOTE — Consult Note (Signed)
Subjective:   HPI  The patient is a 48 year old male who was admitted to the hospital with acute hypoxic respiratory failure and bilateral infiltrates in the setting of a new diagnosis of HIV.  We are asked to see him in regards to a burning type of chest pain. The patient states that he has been coughing quite some time and actually feels like the discomfort in his chest is probably from the coughing that he has been doing. He actually denies heartburn to me he denies dysphagia also. He has no problems of heartburn or dysphagia when he eats. What he tells me is that when he takes a deep breath he has a burning sensation across his anterior chest. He thinks this is probably from all the coughing that he was doing. He denies vomiting. He was started on a PPI because of this burning sensation across his anterior chest however this has not helped.     Past Medical History  Diagnosis Date  . Hypertension   . Hyperlipidemia   . Chronic headaches    Past Surgical History  Procedure Laterality Date  . Bilateral shoulder surgery    . Dental surgery     History   Social History  . Marital Status: Married    Spouse Name: N/A    Number of Children: N/A  . Years of Education: N/A   Occupational History  . Education Admin    Social History Main Topics  . Smoking status: Never Smoker   . Smokeless tobacco: Never Used  . Alcohol Use: No  . Drug Use: No  . Sexual Activity: Not Currently   Other Topics Concern  . Not on file   Social History Narrative  . No narrative on file   family history includes Colon cancer in his maternal grandfather; Heart disease in his father and maternal grandfather. Current facility-administered medications:acetaminophen (TYLENOL) tablet 1,000 mg, 1,000 mg, Oral, Q6H PRN, Nyoka Cowden, MD;  albuterol (PROVENTIL) (2.5 MG/3ML) 0.083% nebulizer solution 2.5 mg, 2.5 mg, Nebulization, Q6H PRN, Nyoka Cowden, MD, 2.5 mg at 09/16/13 2111;  aspirin EC tablet 81  mg, 81 mg, Oral, Daily, Nyoka Cowden, MD, 81 mg at 09/19/13 1610 azithromycin [facility]) tablet 1,200 mg, 1,200 mg, Oral, Weekly, Ginnie Smart, MD, 1,200 mg at 09/14/13 2239;  cloNIDine (CATAPRES) tablet 0.1 mg, 0.1 mg, Oral, TID, Nyoka Cowden, MD, 0.1 mg at 09/19/13 9604;  dextromethorphan-guaiFENesin (MUCINEX DM) 30-600 MG per 12 hr tablet 2 tablet, 2 tablet, Oral, BID, Nyoka Cowden, MD, 2 tablet at 09/19/13 0852 enoxaparin (LOVENOX) injection 30 mg, 30 mg, Subcutaneous, Q24H, Lupita Leash, MD, 30 mg at 09/19/13 1222;  fluconazole (DIFLUCAN) tablet 200 mg, 200 mg, Oral, Daily, Ginnie Smart, MD, 200 mg at 09/19/13 5409;  magnesium hydroxide (MILK OF MAGNESIA) suspension 30 mL, 30 mL, Oral, Daily PRN, Michele Mcalpine, MD;  pantoprazole (PROTONIX) EC tablet 40 mg, 40 mg, Oral, BID AC, Nyoka Cowden, MD, 40 mg at 09/19/13 0853 polyethylene glycol (MIRALAX / GLYCOLAX) packet 17 g, 17 g, Oral, Daily PRN, Storm Frisk, MD, 17 g at 09/18/13 2023;  predniSONE (DELTASONE) tablet 40 mg, 40 mg, Oral, Q breakfast, Lupita Leash, MD, 40 mg at 09/19/13 8119;  senna-docusate (Senokot-S) tablet 2 tablet, 2 tablet, Oral, BID, Kalman Shan, MD, 2 tablet at 09/19/13 1221 sulfamethoxazole-trimethoprim (BACTRIM) 403.2 mg in dextrose 5 % 500 mL IVPB, 20 mg/kg/day, Intravenous, 4 times per day, Nyoka Cowden, MD, 403.2 mg  at 09/19/13 1225;  traMADol (ULTRAM) tablet 50 mg, 50 mg, Oral, Q6H PRN, Nyoka CowdenMichael B Wert, MD, 50 mg at 09/13/13 2341;  zolpidem (AMBIEN) tablet 10 mg, 10 mg, Oral, QHS PRN, Nyoka CowdenMichael B Wert, MD, 10 mg at 09/18/13 2339 Allergies  Allergen Reactions  . Penicillins Rash     Objective:     BP 107/64  Pulse 88  Temp(Src) 97.2 F (36.2 C) (Oral)  Resp 20  Ht 5\' 10"  (1.778 m)  Wt 78.9 kg (173 lb 15.1 oz)  BMI 24.96 kg/m2  SpO2 94%  He is in no distress  Nonicteric  Heart regular rhythm no murmurs  Lungs clear    Laboratory No components found with this basename:  d1      Assessment:     Chest pain. He describes this as a burning sensation across his chest when he takes a deep breath.      Plan:     In talking to him in listening to him described his symptoms I do not since this burning sensation is reflux but is more of a chest wall pain occurring when he takes a deep breath. I can see why he is not responding to a PPI given the description of his symptoms.  If it would be helpful to the infectious disease team we can always do an endoscopy, but given the description of his symptoms to me I don't sense that this is GI/esophageal in nature.

## 2013-09-19 NOTE — Progress Notes (Signed)
PULMONARY / CRITICAL CARE MEDICINE   Name: William Bender MRN: [id number] DOB: 1966-04-20    ADMISSION DATE:  09/13/2013 CONSULTATION DATE:     [name] MD :  [name] PRIMARY SERVICE: Wert/ PCCM  CHIEF COMPLAINT: sob  BRIEF PATIENT DESCRIPTION:   48 y/o male never smoker admitted 3/17 with acute hypoxemic respiratory failure with bilateral infiltrates in setting of a new diagnosis of HIV. Presumed PCP  SIGNIFICANT EVENTS / STUDIES:  3/17 - Admit with hypoxemic resp fx, bilateral infiltrates 3/20 - significant clinical improvement, 2L O2    CULTURES: HIV 3/17 - POSITIVE. CD4 < 60 per ID BC x 2  09/13/13 >>> PCP DFA sputum 3/18 >> negative RVP 3/18>>>neg GC 3/18>>>neg  ANTIBIOTICS: Bactrim IV >> Azithro 3/18>> Diflucan 3/19>>  MEDS SCHED:  . aspirin EC  81 mg Oral Daily  . azithromycin  1,200 mg Oral Weekly  . cloNIDine  0.1 mg Oral TID  . dextromethorphan-guaiFENesin  2 tablet Oral BID  . docusate sodium  100 mg Oral BID  . enoxaparin (LOVENOX) injection  30 mg Subcutaneous Q24H  . fluconazole  200 mg Oral Daily  . pantoprazole  40 mg Oral BID AC  . predniSONE  40 mg Oral Q breakfast  . sulfamethoxazole-trimethoprim  20 mg/kg/day Intravenous 4 times per day    SUBJECTIVE:   Improving but now stuck on 2L [place]. Reports constipation since admissiion but also sternal burning x > 1 month. Not relived by PPI or diflucan in hospital. Dad at bedside  VITAL SIGNS: Temp:  [97.2 F (36.2 C)-98 F (36.7 C)] 97.2 F (36.2 C) (03/23 0641) Pulse Rate:  [76-88] 88 (03/23 0641) Resp:  [18-20] 20 (03/23 0641) BP: (107-115)/(64-71) 107/64 mmHg (03/23 0641) SpO2:  [93 %-94 %] 94 % (03/23 0641) Weight:  [173 lb 15.1 oz (78.9 kg)] 173 lb 15.1 oz (78.9 kg) (03/23 0412)  INTAKE / OUTPUT: Intake/Output     03/22 0701 - 03/23 0700 03/23 0701 - 03/24 0700   P.O. 720    Total Intake(mL/kg) 720 (9.1)    Urine (mL/kg/hr) 2155 (1.1)    Total Output 2155     Net -1435          Stool Occurrence       PHYSICAL EXAMINATION: Gen: no acute distress, sitting up in chair HEENT: NCAT, PERRL, OP clearance of thrush +  PULM: Clear today. On . No increased wob CV: RRR, nl S1/S2 AB: BS+, soft, nontender Ext: warm, no edema Neuro: A&Ox4, maew  PULMONARY No results found for this basename: PHART, PCO2, PCO2ART, PO2, PO2ART, HCO3, TCO2, O2SAT,  in the last 168 hours  CBC  Recent Labs Lab 09/13/13 1105 09/15/13 1020 09/19/13 0500  HGB 12.4* 11.8* 10.9*  HCT 36.2* 33.1* 30.9*  WBC 11.9* 20.4* 10.7*  PLT 189 244 212    COAGULATION  Recent Labs Lab 09/13/13 1105  INR 1.11    CARDIAC  No results found for this basename: TROPONINI,  in the last 168 hours  Recent Labs Lab 09/13/13 1105  PROBNP 193.8*     CHEMISTRY  Recent Labs Lab 09/13/13 1105 09/15/13 1020 09/19/13 0500  NA 134* 134* 130*  K 3.9 4.1 4.3  CL 95* 98 95*  CO2 23 19 22   GLUCOSE 120* 130* 87  BUN 15 17 20   CREATININE 0.93 0.88 1.00  CALCIUM 8.8 8.8 8.7   Estimated Creatinine Clearance: 94.3 ml/min (by C-G formula based on Cr of 1).   LIVER  Recent Labs  Lab 09/13/13 1105 09/19/13 0500  AST 64* 30  ALT 22 15  ALKPHOS 57 45  BILITOT 0.5 0.3  PROT 8.1 6.5  ALBUMIN 3.0* 2.4*  INR 1.11  --      INFECTIOUS No results found for this basename: LATICACIDVEN, PROCALCITON,  in the last 168 hours   ENDOCRINE CBG (last 3)   Recent Labs  09/18/13 1146 09/18/13 1745 09/19/13 0939  GLUCAP 103* 165* 96         IMAGING x48h  Dg Chest 2 View  09/19/2013   CLINICAL DATA:  Followup pneumonia.  EXAM: CHEST  2 VIEW  COMPARISON:  09/15/2013 and 09/14/2013  FINDINGS: Lungs are adequately inflated without focal consolidation or effusion. There is continued minimal hazy left perihilar density. Cardiomediastinal silhouette and remainder of the exam is unchanged.  IMPRESSION: Subtle hazy left perihilar density which may be due to persistent infection versus asymmetric  edema.   Electronically Signed   By: Elberta Fortisaniel  Boyle M.D.   On: 09/19/2013 08:26       ASSESSMENT / PLAN:  Acute hypoxemic resp failure in pt with bilateral pulmonary infiltrates in setting of new dx of HIV/AIDS with very low CD4. Presumed PCP   - on 2L [place] Plan:  -Continue empiric treatment for PCP (Bacterim Qid) -continue Rx per ID w/ Diflucan, & Zithromax weekly  -await CMV -await urine histo antigen -serum LDH 631 -no BAL needed at this time (per ID) -wean O2 as tolerated -check CBC/BMET today -ambulate -steroids as ordered=> 3/22 wean Pred to 20mg  Qam... - f/u CXR 3/23 AM   Hypertension -continue clonidine  Constipation  - start senna docusate   Heartburn despite ppi and diflucan   - GI Dr Evette CristalGanem called    Best practice: Feeding: regular diet Analgesia: APAP prn Sedation: n/a Thromboprophylaxis: lovenox HOB >30 degrees Ulcer prophylaxis: ppi Glucose control: monitor poc glucose  Will ask TRH to assume primary 09/20/13   Dr. Kalman ShanMurali Tya Haughey, M.D., F.C.C.P Pulmonary and Critical Care Medicine Staff Physician [facility] [place] [facility] Pager: [phone], If no answer or between  15:00h - 7:00h: call [phone]  09/19/2013 10:08 AM

## 2013-09-19 NOTE — Progress Notes (Addendum)
INFECTIOUS DISEASE PROGRESS NOTE  ID: William Bender is a 48 y.o. male with  Active Problems:   Acute respiratory failure  Subjective: Feels good. Took shower off O2  Abtx:  Anti-infectives   Start     Dose/Rate Route Frequency Ordered Stop   09/14/13 2200  azithromycin (ZITHROMAX) tablet 1,200 mg     1,200 mg Oral Weekly 09/14/13 1659     09/14/13 1800  fluconazole (DIFLUCAN) tablet 200 mg     200 mg Oral Daily 09/14/13 1659     09/13/13 1800  sulfamethoxazole-trimethoprim (BACTRIM) 403.2 mg in dextrose 5 % 500 mL IVPB     20 mg/kg/day  80.6 kg 350.1 mL/hr over 90 Minutes Intravenous 4 times per day 09/13/13 1732        Medications:  Scheduled: . aspirin EC  81 mg Oral Daily  . azithromycin  1,200 mg Oral Weekly  . cloNIDine  0.1 mg Oral TID  . dextromethorphan-guaiFENesin  2 tablet Oral BID  . enoxaparin (LOVENOX) injection  30 mg Subcutaneous Q24H  . fluconazole  200 mg Oral Daily  . pantoprazole  40 mg Oral BID AC  . predniSONE  40 mg Oral Q breakfast  . senna-docusate  2 tablet Oral BID  . sulfamethoxazole-trimethoprim  20 mg/kg/day Intravenous 4 times per day    Objective: Vital signs in last 24 hours: Temp:  [97.2 F (36.2 C)-97.4 F (36.3 C)] 97.2 F (36.2 C) (03/23 0641) Pulse Rate:  [76-88] 88 (03/23 0641) Resp:  [18-20] 20 (03/23 0641) BP: (107-115)/(64-71) 107/64 mmHg (03/23 0641) SpO2:  [93 %-94 %] 94 % (03/23 0641) Weight:  [78.9 kg (173 lb 15.1 oz)] 78.9 kg (173 lb 15.1 oz) (03/23 0412)   General appearance: alert, cooperative and no distress Throat: abnormal findings: thrush Resp: clear to auscultation bilaterally Cardio: regular rate and rhythm GI: normal findings: bowel sounds normal and soft, non-tender  Lab Results  Recent Labs  09/19/13 0500  WBC 10.7*  HGB 10.9*  HCT 30.9*  NA 130*  K 4.3  CL 95*  CO2 22  BUN 20  CREATININE 1.00   Liver Panel  Recent Labs  09/19/13 0500  PROT 6.5  ALBUMIN 2.4*  AST 30  ALT 15    ALKPHOS 45  BILITOT 0.3   Sedimentation Rate No results found for this basename: ESRSEDRATE,  in the last 72 hours C-Reactive Protein No results found for this basename: CRP,  in the last 72 hours  Microbiology: Recent Results (from the past 240 hour(s))  CULTURE, BLOOD (ROUTINE X 2)     Status: None   Collection Time    09/13/13 11:05 AM      Result Value Ref Range Status   Specimen Description BLOOD LEFT ARM   Final   Special Requests BOTTLES DRAWN AEROBIC AND ANAEROBIC [facility] EACH   Final   Culture  Setup Time     Final   Value: 09/13/2013 14:03     Performed at [facility]   Culture     Final   Value: NO GROWTH 5 DAYS     Performed at [facility]   Report Status 09/19/2013 FINAL   Final  CULTURE, BLOOD (ROUTINE X 2)     Status: None   Collection Time    09/13/13 11:15 AM      Result Value Ref Range Status   Specimen Description BLOOD RIGHT ARM   Final   Special Requests BOTTLES DRAWN AEROBIC AND ANAEROBIC [place]  Final   Culture  Setup Time     Final   Value: 09/13/2013 14:03     Performed at [facility]   Culture     Final   Value: NO GROWTH 5 DAYS     Performed at [facility]   Report Status 09/19/2013 FINAL   Final  MRSA PCR SCREENING     Status: Abnormal   Collection Time    09/13/13  8:17 PM      Result Value Ref Range Status   MRSA by PCR INVALID RESULTS, SPECIMEN SENT FOR CULTURE (*) NEGATIVE Corrected   Comment: RESULT CALLED TO, READ BACK BY AND VERIFIED WITH:     SPOKE WITH BANKS,T RN [phone] [place],N     CORRECTED ON 03/18 AT 1155: PREVIOUSLY REPORTED AS RESULT CALLED TO, READ BACK BY AND VERIFIED WITH: SPOKE WITH BANKS,T RN [phone] C[place],N        The GeneXpert MRSA Assay (FDA approved for NASAL specimens only), is one component of a      comprehensive MRSA colonization surveillance program. It is not intended to diagnose MRSA infection nor to guide or monitor treatment for MRSA infections.  MRSA CULTURE      Status: None   Collection Time    09/13/13  8:17 PM      Result Value Ref Range Status   Specimen Description NASOPHARYNGEAL   Final   Special Requests NONE   Final   Culture     Final   Value: NOMRSA     Performed at [facility]   Report Status 09/16/2013 FINAL   Final  RESPIRATORY VIRUS PANEL     Status: None   Collection Time    09/14/13 12:20 PM      Result Value Ref Range Status   Source - RVPAN NASAL SWAB   Corrected   Comment: CORRECTED ON 03/19 AT 1848: PREVIOUSLY REPORTED AS NASAL SWAB   Respiratory Syncytial Virus A NOT DETECTED   Final   Respiratory Syncytial Virus B NOT DETECTED   Final   Influenza A NOT DETECTED   Final   Influenza B NOT DETECTED   Final   Parainfluenza 1 NOT DETECTED   Final   Parainfluenza 2 NOT DETECTED   Final   Parainfluenza 3 NOT DETECTED   Final   Metapneumovirus NOT DETECTED   Final   Rhinovirus NOT DETECTED   Final   Adenovirus NOT DETECTED   Final   Influenza A H1 NOT DETECTED   Final   Influenza A H3 NOT DETECTED   Final   Comment: (NOTE)           Normal Reference Range for each Analyte: NOT DETECTED     Testing performed using the Luminex xTAG Respiratory Viral Panel test     kit.     This test was developed and its performance characteristics determined     by [facility]. It has not been cleared or approved by the [place]     Food and Drug Administration. This test is used for clinical purposes.     It should not be regarded as investigational or for research. This     laboratory is certified under the [place] (CLIA) as qualified to perform high complexity     clinical laboratory testing.     Performed at [place] PROBE AMP     Status: None  Collection Time    09/14/13  9:15 PM      Result Value Ref Range Status   CT Probe RNA NEGATIVE  NEGATIVE Final   GC Probe RNA NEGATIVE  NEGATIVE Final   Comment: (NOTE)                                                                                                **Normal Reference Range: Negative**          Assay performed using the Gen-Probe APTIMA COMBO2 (R) Assay.     Acceptable specimen types for this assay include APTIMA Swabs (Unisex,     endocervical, urethral, or vaginal), first void urine, and ThinPrep     liquid based cytology samples.     Performed at [place] BY DFA     Status: None   Collection Time    09/14/13 10:15 PM      Result Value Ref Range Status   Specimen Source-PJSRC SPUTUM   Final   Pneumocystis jiroveci Ag NEGATIVE   Final   Comment: Performed at [place] [facility]    Studies/Results: Dg Chest 2 View  09/19/2013   CLINICAL DATA:  Followup pneumonia.  EXAM: CHEST  2 VIEW  COMPARISON:  09/15/2013 and 09/14/2013  FINDINGS: Lungs are adequately inflated without focal consolidation or effusion. There is continued minimal hazy left perihilar density. Cardiomediastinal silhouette and remainder of the exam is unchanged.  IMPRESSION: Subtle hazy left perihilar density which may be due to persistent infection versus asymmetric edema.   Electronically Signed   By: Marin Olp M.D.   On: 09/19/2013 08:26     Assessment/Plan: AIDS Presumed PCP Chest pain  Appreciate GI eval Pt now 94% on 2L. I re-tested him off O2- he is 92-94% with HR 89-94.  Awaiting his HLA and HIV genotype testing. Spoke with several people in lab, these should be available 3-30.  Will change his bactrim and steroids to PO, will start him on stribild.  Discussed this with pt and wife and possible sfx.  Via epocrates, stribild will not interact with crestor or clonidine  Total days of antibiotics: 6 (bactrim/steroids, fluconazole, azithromycin)         Bobby Rumpf Infectious Diseases (pager) [phone] [email or web address] 09/19/2013, 3:11 PM  LOS: 6 days

## 2013-09-20 LAB — CMV (CYTOMEGALOVIRUS) DNA ULTRAQUANT, PCR: CMV DNA Quant: 2440 copies/mL — ABNORMAL HIGH

## 2013-09-20 LAB — GLUCOSE, CAPILLARY
Glucose-Capillary: 89 mg/dL (ref 70–99)
Glucose-Capillary: 82 mg/dL (ref 70–99)

## 2013-09-20 LAB — HISTOPLASMA ANTIGEN, URINE: Histoplasma Antigen, urine: <0.5 ng/mL

## 2013-09-20 MED ORDER — PREDNISONE 20 MG PO TABS
20.0000 mg | ORAL_TABLET | Freq: Every day | ORAL | Status: DC
Start: 2013-09-26 — End: 2013-09-20

## 2013-09-20 MED ORDER — ELVITEG-COBIC-EMTRICIT-TENOFDF 150-150-200-300 MG PO TABS
1.0000 | ORAL_TABLET | Freq: Every day | ORAL | Status: DC
  Filled 2013-09-20: qty 1

## 2013-09-20 MED ORDER — TRAMADOL HCL 50 MG PO TABS: 50.0000 mg | ORAL_TABLET | Freq: Four times a day (QID) | ORAL | Status: AC | PRN

## 2013-09-20 MED ORDER — ZOLPIDEM TARTRATE 10 MG PO TABS: 10.0000 mg | ORAL_TABLET | Freq: Every evening | ORAL | Status: DC | PRN

## 2013-09-20 MED ORDER — MAGNESIUM CITRATE PO SOLN: 1.0000 | Freq: Once | ORAL | Status: DC

## 2013-09-20 MED ORDER — SENNOSIDES-DOCUSATE SODIUM 8.6-50 MG PO TABS: 2.0000 | ORAL_TABLET | Freq: Every evening | ORAL | Status: AC | PRN

## 2013-09-20 MED ORDER — PREDNISONE 20 MG PO TABS: ORAL_TABLET | ORAL | Status: DC

## 2013-09-20 MED ORDER — AZITHROMYCIN 600 MG PO TABS: 1200.0000 mg | ORAL_TABLET | ORAL | Status: DC

## 2013-09-20 MED ORDER — CLONIDINE HCL 0.1 MG PO TABS: 0.1000 mg | ORAL_TABLET | Freq: Three times a day (TID) | ORAL | Status: AC

## 2013-09-20 MED ORDER — SULFAMETHOXAZOLE-TMP DS 800-160 MG PO TABS: 2.0000 | ORAL_TABLET | Freq: Three times a day (TID) | ORAL | Status: DC

## 2013-09-20 MED ORDER — FLEET ENEMA 7-19 GM/118ML RE ENEM: 1.0000 | ENEMA | Freq: Once | RECTAL | Status: DC

## 2013-09-20 MED ORDER — FLUCONAZOLE 100 MG PO TABS: 100.0000 mg | ORAL_TABLET | Freq: Every day | ORAL | Status: DC

## 2013-09-20 MED ORDER — ELVITEG-COBIC-EMTRICIT-TENOFDF 150-150-200-300 MG PO TABS: 1.0000 | ORAL_TABLET | Freq: Every day | ORAL | Status: DC

## 2013-09-20 NOTE — Progress Notes (Signed)
Patient discharge home with wife, alert and oriented, discharge instruction given, patient verbalize understanding of discharge orders given, Active member of My Chart, patient in stable condition at this time

## 2013-09-20 NOTE — Progress Notes (Signed)
Clinical Social Work  CSW received referral in order to complete psychosocial assessment. CSW went to room but patient currently in the shower. CSW will follow up at later time.  [place]ia HeightsHolly Aleyza [place], KentuckyLCSW [phone]

## 2013-09-20 NOTE — Progress Notes (Signed)
INFECTIOUS DISEASE PROGRESS NOTE  ID: William Bender is a 48 y.o. male with  Active Problems:   Acute respiratory failure  Subjective: Feels better, CP decreased.   Abtx:  Anti-infectives   Start     Dose/Rate Route Frequency Ordered Stop   09/20/13 1000  fluconazole (DIFLUCAN) tablet 100 mg     100 mg Oral Daily 09/19/13 1625     09/19/13 1700  sulfamethoxazole-trimethoprim (BACTRIM DS) 800-160 MG per tablet 2 tablet     2 tablet Oral 3 times per day 09/19/13 1625 10/04/13 1359   09/14/13 2200  azithromycin (ZITHROMAX) tablet 1,200 mg     1,200 mg Oral Weekly 09/14/13 1659     09/14/13 1800  fluconazole (DIFLUCAN) tablet 200 mg  Status:  Discontinued     200 mg Oral Daily 09/14/13 1659 09/19/13 1625   09/13/13 1800  sulfamethoxazole-trimethoprim (BACTRIM) 403.2 mg in dextrose 5 % 500 mL IVPB  Status:  Discontinued     20 mg/kg/day  80.6 kg 350.1 mL/hr over 90 Minutes Intravenous 4 times per day 09/13/13 1732 09/19/13 1625      Medications:  Scheduled: . aspirin EC  81 mg Oral Daily  . azithromycin  1,200 mg Oral Weekly  . cloNIDine  0.1 mg Oral TID  . dextromethorphan-guaiFENesin  2 tablet Oral BID  . enoxaparin (LOVENOX) injection  30 mg Subcutaneous Q24H  . fluconazole  100 mg Oral Daily  . pantoprazole  40 mg Oral BID AC  . predniSONE  40 mg Oral Q breakfast  . senna-docusate  2 tablet Oral BID  . sulfamethoxazole-trimethoprim  2 tablet Oral 3 times per day    Objective: Vital signs in last 24 hours: Temp:  [97.5 F (36.4 C)-98.1 F (36.7 C)] 98.1 F (36.7 C) (03/24 0440) Pulse Rate:  [87-88] 87 (03/24 0440) Resp:  [16-18] 18 (03/23 2138) BP: (116-144)/(70-83) 118/71 mmHg (03/24 0440) SpO2:  [93 %-94 %] 93 % (03/24 0440) Weight:  [77.1 kg (169 lb 15.6 oz)] 77.1 kg (169 lb 15.6 oz) (03/24 0440)   General appearance: alert, cooperative and no distress Resp: diminished breath sounds bilaterally Cardio: regular rate and rhythm GI: normal findings: bowel  sounds normal and soft, non-tender  Lab Results  Recent Labs  09/19/13 0500  WBC 10.7*  HGB 10.9*  HCT 30.9*  NA 130*  K 4.3  CL 95*  CO2 22  BUN 20  CREATININE 1.00   Liver Panel  Recent Labs  09/19/13 0500  PROT 6.5  ALBUMIN 2.4*  AST 30  ALT 15  ALKPHOS 45  BILITOT 0.3   Sedimentation Rate No results found for this basename: ESRSEDRATE,  in the last 72 hours C-Reactive Protein No results found for this basename: CRP,  in the last 72 hours  Microbiology: Recent Results (from the past 240 hour(s))  CULTURE, BLOOD (ROUTINE X 2)     Status: None   Collection Time    09/13/13 11:05 AM      Result Value Ref Range Status   Specimen Description BLOOD LEFT ARM   Final   Special Requests BOTTLES DRAWN AEROBIC AND ANAEROBIC [facility] EACH   Final   Culture  Setup Time     Final   Value: 09/13/2013 14:03     Performed at [facility]   Culture     Final   Value: NO GROWTH 5 DAYS     Performed at [facility]   Report Status 09/19/2013 FINAL   Final  CULTURE, BLOOD (ROUTINE X 2)     Status: None   Collection Time    09/13/13 11:15 AM      Result Value Ref Range Status   Specimen Description BLOOD RIGHT ARM   Final   Special Requests BOTTLES DRAWN AEROBIC AND ANAEROBIC 10CC EACH   Final   Culture  Setup Time     Final   Value: 09/13/2013 14:03     Performed at [facility]   Culture     Final   Value: NO GROWTH 5 DAYS     Performed at [facility]   Report Status 09/19/2013 FINAL   Final  MRSA PCR SCREENING     Status: Abnormal   Collection Time    09/13/13  8:17 PM      Result Value Ref Range Status   MRSA by PCR INVALID RESULTS, SPECIMEN SENT FOR CULTURE (*) NEGATIVE Corrected   Comment: RESULT CALLED TO, READ BACK BY AND VERIFIED WITH:     SPOKE WITH BANKS,T RN [phone] COVINGTON,N     CORRECTED ON 03/18 AT 1155: PREVIOUSLY REPORTED AS RESULT CALLED TO, READ BACK BY AND VERIFIED WITH: SPOKE WITH BANKS,T RN [phone]  COVINGTON,N        The GeneXpert MRSA Assay (FDA approved for NASAL specimens only), is one component of a      comprehensive MRSA colonization surveillance program. It is not intended to diagnose MRSA infection nor to guide or monitor treatment for MRSA infections.  MRSA CULTURE     Status: None   Collection Time    09/13/13  8:17 PM      Result Value Ref Range Status   Specimen Description NASOPHARYNGEAL   Final   Special Requests NONE   Final   Culture     Final   Value: NOMRSA     Performed at [facility]   Report Status 09/16/2013 FINAL   Final  RESPIRATORY VIRUS PANEL     Status: None   Collection Time    09/14/13 12:20 PM      Result Value Ref Range Status   Source - RVPAN NASAL SWAB   Corrected   Comment: CORRECTED ON 03/19 AT 1848: PREVIOUSLY REPORTED AS NASAL SWAB   Respiratory Syncytial Virus A NOT DETECTED   Final   Respiratory Syncytial Virus B NOT DETECTED   Final   Influenza A NOT DETECTED   Final   Influenza B NOT DETECTED   Final   Parainfluenza 1 NOT DETECTED   Final   Parainfluenza 2 NOT DETECTED   Final   Parainfluenza 3 NOT DETECTED   Final   Metapneumovirus NOT DETECTED   Final   Rhinovirus NOT DETECTED   Final   Adenovirus NOT DETECTED   Final   Influenza A H1 NOT DETECTED   Final   Influenza A H3 NOT DETECTED   Final   Comment: (NOTE)           Normal Reference Range for each Analyte: NOT DETECTED     Testing performed using the Luminex xTAG Respiratory Viral Panel test     kit.     This test was developed and its performance characteristics determined     by [facility]. It has not been cleared or approved by the [place]     Food and Drug Administration. This test is used for clinical purposes.     It should not be regarded as investigational or for research.  This     laboratory is certified under the [place] (CLIA) as qualified to perform high complexity     clinical laboratory testing.      Performed at [facility]     Status: None   Collection Time    09/14/13  9:15 PM      Result Value Ref Range Status   CT Probe RNA NEGATIVE  NEGATIVE Final   GC Probe RNA NEGATIVE  NEGATIVE Final   Comment: (NOTE)                                                                                               **Normal Reference Range: Negative**          Assay performed using the Gen-Probe APTIMA COMBO2 (R) Assay.     Acceptable specimen types for this assay include APTIMA Swabs (Unisex,     endocervical, urethral, or vaginal), first void urine, and ThinPrep     liquid based cytology samples.     Performed at [facility]     Status: None   Collection Time    09/14/13 10:15 PM      Result Value Ref Range Status   Specimen Source-PJSRC SPUTUM   Final   Pneumocystis jiroveci Ag NEGATIVE   Final   Comment: Performed at [facility]    Studies/Results: Dg Chest 2 View  09/19/2013   CLINICAL DATA:  Followup pneumonia.  EXAM: CHEST  2 VIEW  COMPARISON:  09/15/2013 and 09/14/2013  FINDINGS: Lungs are adequately inflated without focal consolidation or effusion. There is continued minimal hazy left perihilar density. Cardiomediastinal silhouette and remainder of the exam is unchanged.  IMPRESSION: Subtle hazy left perihilar density which may be due to persistent infection versus asymmetric edema.   Electronically Signed   By: Marin Olp M.D.   On: 09/19/2013 08:26     Assessment/Plan: AIDS  Presumed PCP  Chest pain- improved  Possible d/c today. Our clinic is available to him for answering questions once d/c, he has f/u appt scheduled with me.  He has been up ambulating, wants to try stairs today.  Awaiting his HLA and HIV genotype testing (should be available 3-30).  Will change his bactrim and steroids to PO, will start him on stribild.  Via epocrates, stribild will not interact with crestor or  clonidine   Total days of antibiotics: 7 (bactrim/steroids, fluconazole, azithromycin)         Bobby Rumpf Infectious Diseases (pager) [phone] [email or web address] 09/20/2013, 8:18 AM  LOS: 7 days

## 2013-09-20 NOTE — Discharge Summary (Signed)
Physician Discharge Summary  William Bender TZG:[id number] DOB: 13-May-1966 DOA: 09/13/2013  PCP: Tawanna Solo, MD  Admit date: 09/13/2013 Discharge date: 09/20/2013  Time spent: 25 minutes  Recommendations for Outpatient Follow-up:  1. FOLLOW up pcp in one week 2. Follow up with ID AS RECOMMENDED  Discharge Diagnoses:  Active Problems:   Acute respiratory failure HIV PRESUMED PCP    Discharge Condition: improved.  Diet recommendation: regular  Filed Weights   09/18/13 0648 09/19/13 0412 09/20/13 0440  Weight: 79.2 kg (174 lb 9.7 oz) 78.9 kg (173 lb 15.1 oz) 77.1 kg (169 lb 15.6 oz)    History of present illness:   54 yowm never smoker with heart murmur req cath age [age] but excellent ex tol thereafter and no tendency to any respiratory problems and continued to play full pad hockey but had shingles spring 2014 and since then not quite the same with recurrent cough then doe x Dec 2014 while playing hockey which he stopped completely late Feb 2015 and referred to pulmonary 09/13/2013 by Kathyrn Lass with acute deterioration cough and sob and sats only in 60's on arrival with diffuse infiltrates on cxr in setting of nl CT chest 09/02/13 so admitted to [facility] for further management.   Hospital Course:  Acute hypoxemic resp failure in pt with bilateral pulmonary infiltrates in setting of new dx of HIV/AIDS with very low CD4. Presumed PCP He was started on empiric treatment for PCP. He was on bactrim, diflucan and zithromax to complete the course. No BAL needing at this time. We have weaned him off oxygen. Wean steroids in the next 3 days.  Follow up with ID as recommended.   Hypertension: Controlled. Continue with clonidine.   Constipation: Senna and colace.   Heartburn : PPI and diflucan   DVT prophylaxis.      Procedures: NONE Consultations:  ID  GI  PCCM  Discharge Exam: Filed Vitals:   09/20/13 0900  BP: 132/82  Pulse: 82  Temp: 98.1 F (36.7 C)  Resp: 18     General: alert afebrile comfortable Cardiovascular: s1s2 Respiratory: ctab  Discharge Instructions   Future Appointments Provider Department Dept Phone   09/27/2013 9:30 AM Campbell Riches, MD [facility] for Infectious Disease [phone]   10/07/2013 4:00 PM [facility]  Pulmonary Care [phone]       Medication List    STOP taking these medications       diphenhydramine-acetaminophen 25-500 MG Tabs  Commonly known as:  TYLENOL PM     Fish Oil 1200 MG Caps     ibuprofen 200 MG tablet  Commonly known as:  ADVIL,MOTRIN     valsartan-hydrochlorothiazide 160-25 MG per tablet  Commonly known as:  DIOVAN-HCT      TAKE these medications       acetaminophen 500 MG tablet  Commonly known as:  TYLENOL  Take 1,000 mg by mouth every 6 (six) hours as needed (Pain).     albuterol 108 (90 BASE) MCG/ACT inhaler  Commonly known as:  PROVENTIL HFA;VENTOLIN HFA  Inhale 1 puff into the lungs every 6 (six) hours as needed for wheezing or shortness of breath.     aspirin EC 81 MG tablet  Take 81 mg by mouth daily.     azithromycin 600 MG tablet  Commonly known as:  ZITHROMAX  Take 2 tablets (1,200 mg total) by mouth once a week.     cloNIDine 0.1 MG tablet  Commonly known as:  CATAPRES  Take 1 tablet (0.1 mg total) by mouth 3 (three) times daily.     elvitegravir-cobicistat-emtricitabine-tenofovir 150-150-200-300 MG Tabs tablet  Commonly known as:  STRIBILD  Take 1 tablet by mouth daily with breakfast.  Start taking on:  09/21/2013     MUCINEX DM 30-600 MG Tb12  Take 1 tablet by mouth 2 (two) times daily as needed (congestion).     omeprazole 40 MG capsule  Commonly known as:  PRILOSEC  Take 40 mg by mouth 2 (two) times daily.     rosuvastatin 10 MG tablet  Commonly known as:  CRESTOR  Take 10 mg by mouth daily.     senna-docusate 8.6-50 MG per tablet  Commonly known as:  Senokot-S  Take 2 tablets by mouth at bedtime as needed  for mild constipation.     sulfamethoxazole-trimethoprim 800-160 MG per tablet  Commonly known as:  BACTRIM DS  Take 2 tablets by mouth every 8 (eight) hours.     traMADol 50 MG tablet  Commonly known as:  ULTRAM  Take 1 tablet (50 mg total) by mouth every 6 (six) hours as needed (cough).       Allergies  Allergen Reactions  . Penicillins Rash       Follow-up Information   Follow up with Tawanna Solo, MD. Schedule an appointment as soon as possible for a visit in 1 week.   Specialty:  Family Medicine   Contact information:   [place] [address] [phone]       Follow up with Wonda Horner, MD. (As needed)    Specialty:  Gastroenterology   Contact information:   [address], [place] [address] [phone]       Follow up with Bobby Rumpf, MD. Schedule an appointment as soon as possible for a visit in 1 week.   Specialty:  Infectious Diseases   Contact information:   [address]  [address] [place] [address] [phone]        The results of significant diagnostics from this hospitalization (including imaging, microbiology, ancillary and laboratory) are listed below for reference.    Significant Diagnostic Studies: Dg Chest 2 View  09/19/2013   CLINICAL DATA:  Followup pneumonia.  EXAM: CHEST  2 VIEW  COMPARISON:  09/15/2013 and 09/14/2013  FINDINGS: Lungs are adequately inflated without focal consolidation or effusion. There is continued minimal hazy left perihilar density. Cardiomediastinal silhouette and remainder of the exam is unchanged.  IMPRESSION: Subtle hazy left perihilar density which may be due to persistent infection versus asymmetric edema.   Electronically Signed   By: Marin Olp M.D.   On: 09/19/2013 08:26   Dg Chest 2 View  09/13/2013   CLINICAL DATA:  Dyspnea  EXAM: CHEST  2 VIEW  COMPARISON:  09/02/2013  FINDINGS: Cardiomediastinal silhouette is stable. There is  streaky bilateral infrahilar and basilar atelectasis or infiltrate with worsening in aeration. No convincing pulmonary edema. Bilateral spurring of humeral head.  IMPRESSION: Streaky bilateral infrahilar and basilar atelectasis or infiltrate with worsening in aeration. No convincing pulmonary edema. Follow-up examination is recommended.   Electronically Signed   By: Lahoma Crocker M.D.   On: 09/13/2013 09:28   Ct Angio Chest Pe W/cm &/or Wo Cm  09/02/2013   CLINICAL DATA:  Chest pain.  Reflux.  Dyspnea.  Pulmonary embolism.  EXAM: CT ANGIOGRAPHY CHEST WITH CONTRAST  TECHNIQUE: Multidetector CT imaging of the chest was performed using the  standard protocol during bolus administration of intravenous contrast. Multiplanar CT image reconstructions and MIPs were obtained to evaluate the vascular anatomy.  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 This can be associated with dysphagia (Dysphagia lusoria). Aorta appears within normal limits. There is no axillary adenopathy. No mediastinal or hilar adenopathy. Coronary artery atherosclerosis is present. If office based assessment of coronary risk factors has not been performed, it is now recommended.  The study is technically adequate for evaluation of pulmonary embolus. No pulmonary embolism is present. There is some mild respiratory motion artifact at the bases. Central airways appear patent. The lungs are clear. The lungs demonstrate dependent atelectasis. No airspace disease. No effusion. Incidental imaging of the upper abdomen is within normal limits. 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  Review of the MIP images confirms the above findings.  IMPRESSION: 1. Negative for pulmonary embolus or acute aortic abnormality. 2. 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 3. No active cardiopulmonary disease.   Electronically Signed   By: GDereck LigasM.D.   On: 09/02/2013 16:53   Dg Chest Port 1 View  09/15/2013   CLINICAL DATA:  Shortness of breath  EXAM: PORTABLE CHEST - 1 VIEW  COMPARISON:  September 14, 2013  FINDINGS: The heart size and mediastinal contours are stable. The heart size is mildly enlarged. There is increased pulmonary interstitium bilaterally unchanged. The visualized skeletal structures are stable.  IMPRESSION: Persistent cardiomegaly with diffuse increased pulmonary interstitium unchanged compared to prior exam. Differential diagnosis includes pulmonary edema versus atypical pneumonia.   Electronically Signed   By: WAbelardo DieselM.D.   On: 09/15/2013 10:37   Dg Chest Port 1 View  09/14/2013   CLINICAL DATA:  Shortness of breath.  EXAM: PORTABLE CHEST - 1 VIEW  COMPARISON:  DG CHEST 2 VIEW dated 09/13/2013; CT ANGIO CHEST W/CM &/OR WO/CM dated 09/02/2013  FINDINGS: Mild cardiomegaly with indistinct pulmonary vasculature and bilateral airspace opacities, mildly increased from the prior exam.  Degenerative spurring both glenohumeral joints.  IMPRESSION: 1. Cardiomegaly with indistinct pulmonary vasculature in bilateral indistinct airspace opacities, mildly worsened from prior. I have reviewed the patient's chart and note the recent diagnosis of HIV with low lymphocytes, grade 1 diastolic dysfunction on the echocardiography, and mildly elevated BNP. From a purely radiographic standpoint, the appearance on today's exam could be a manifestation of mild pulmonary edema or atypical pneumonia with incidental mild cardiomegaly.   Electronically Signed   By: WSherryl BartersM.D.   On: 09/14/2013 11:07    Microbiology: Recent Results (from the past 240 hour(s))  CULTURE, BLOOD (ROUTINE X 2)     Status: None   Collection Time    09/13/13  11:05 AM      Result Value Ref Range Status   Specimen Description BLOOD LEFT ARM   Final   Special Requests BOTTLES DRAWN AEROBIC AND ANAEROBIC [facility]   Final   Culture  Setup Time     Final   Value: 09/13/2013 14:03     Performed at [facility]  Culture     Final   Value: NO GROWTH 5 DAYS     Performed at [facility]  Report Status 09/19/2013  FINAL   Final  CULTURE, BLOOD (ROUTINE X 2)     Status: None   Collection Time    09/13/13 11:15 AM      Result Value Ref Range Status   Specimen Description BLOOD RIGHT ARM   Final   Special Requests BOTTLES DRAWN AEROBIC AND ANAEROBIC 10CC EACH   Final   Culture  Setup Time     Final   Value: 09/13/2013 14:03     Performed at [facility]   Culture     Final   Value: NO GROWTH 5 DAYS     Performed at [facility]   Report Status 09/19/2013 FINAL   Final  MRSA PCR SCREENING     Status: Abnormal   Collection Time    09/13/13  8:17 PM      Result Value Ref Range Status   MRSA by PCR INVALID RESULTS, SPECIMEN SENT FOR CULTURE (*) NEGATIVE Corrected   Comment: RESULT CALLED TO, READ BACK BY AND VERIFIED WITH:     SPOKE WITH BANKS,T RN [phone] COVINGTON,N     CORRECTED ON 03/18 AT 1155: PREVIOUSLY REPORTED AS RESULT CALLED TO, READ BACK BY AND VERIFIED WITH: SPOKE WITH BANKS,T RN [phone] COVINGTON,N        The GeneXpert MRSA Assay (FDA approved for NASAL specimens only), is one component of a      comprehensive MRSA colonization surveillance program. It is not intended to diagnose MRSA infection nor to guide or monitor treatment for MRSA infections.  MRSA CULTURE     Status: None   Collection Time    09/13/13  8:17 PM      Result Value Ref Range Status   Specimen Description NASOPHARYNGEAL   Final   Special Requests NONE   Final   Culture     Final   Value: NOMRSA     Performed at [facility]   Report Status 09/16/2013 FINAL   Final  RESPIRATORY VIRUS PANEL     Status: None    Collection Time    09/14/13 12:20 PM      Result Value Ref Range Status   Source - RVPAN NASAL SWAB   Corrected   Comment: CORRECTED ON 03/19 AT 1848: PREVIOUSLY REPORTED AS NASAL SWAB   Respiratory Syncytial Virus A NOT DETECTED   Final   Respiratory Syncytial Virus B NOT DETECTED   Final   Influenza A NOT DETECTED   Final   Influenza B NOT DETECTED   Final   Parainfluenza 1 NOT DETECTED   Final   Parainfluenza 2 NOT DETECTED   Final   Parainfluenza 3 NOT DETECTED   Final   Metapneumovirus NOT DETECTED   Final   Rhinovirus NOT DETECTED   Final   Adenovirus NOT DETECTED   Final   Influenza A H1 NOT DETECTED   Final   Influenza A H3 NOT DETECTED   Final   Comment: (NOTE)           Normal Reference Range for each Analyte: NOT DETECTED     Testing performed using the Luminex xTAG Respiratory Viral Panel test     kit.     This test was developed and its performance characteristics determined     by [facility]. It has not been cleared or approved by the [place]     Food and Drug Administration. This test is used for clinical purposes.     It should not be regarded  as investigational or for research. This     laboratory is certified under the [place] (CLIA) as qualified to perform high complexity     clinical laboratory testing.     Performed at [facility]     Status: None   Collection Time    09/14/13  9:15 PM      Result Value Ref Range Status   CT Probe RNA NEGATIVE  NEGATIVE Final   GC Probe RNA NEGATIVE  NEGATIVE Final   Comment: (NOTE)                                                                                               **Normal Reference Range: Negative**          Assay performed using the Gen-Probe APTIMA COMBO2 (R) Assay.     Acceptable specimen types for this assay include APTIMA Swabs (Unisex,     endocervical, urethral, or vaginal), first void urine, and ThinPrep     liquid  based cytology samples.     Performed at [facility]     Status: None   Collection Time    09/14/13 10:15 PM      Result Value Ref Range Status   Specimen Source-PJSRC SPUTUM   Final   Pneumocystis jiroveci Ag NEGATIVE   Final   Comment: Performed at [facility]     Labs: Basic Metabolic Panel:  Recent Labs Lab 09/15/13 1020 09/19/13 0500  NA 134* 130*  K 4.1 4.3  CL 98 95*  CO2 19 22  GLUCOSE 130* 87  BUN 17 20  CREATININE 0.88 1.00  CALCIUM 8.8 8.7   Liver Function Tests:  Recent Labs Lab 09/19/13 0500  AST 30  ALT 15  ALKPHOS 45  BILITOT 0.3  PROT 6.5  ALBUMIN 2.4*   No results found for this basename: LIPASE, AMYLASE,  in the last 168 hours No results found for this basename: AMMONIA,  in the last 168 hours CBC:  Recent Labs Lab 09/15/13 1020 09/19/13 0500  WBC 20.4* 10.7*  NEUTROABS 18.8* 9.7*  HGB 11.8* 10.9*  HCT 33.1* 30.9*  MCV 84.9 85.6  PLT 244 212   Cardiac Enzymes: No results found for this basename: CKTOTAL, CKMB, CKMBINDEX, TROPONINI,  in the last 168 hours BNP: BNP (last 3 results)  Recent Labs  09/13/13 1105  PROBNP 193.8*   CBG:  Recent Labs Lab 09/19/13 0939 09/19/13 1338 09/19/13 1727 09/19/13 2306 09/20/13 0747  GLUCAP 96 182* 161* 115* 89       Signed:  Reyonna Haack  [facility] 09/20/2013, 11:05 AM

## 2013-09-21 ENCOUNTER — Telehealth: Payer: Self-pay | Admitting: *Deleted

## 2013-09-21 DIAGNOSIS — B20 Human immunodeficiency virus [HIV] disease: Secondary | ICD-10-CM

## 2013-09-21 MED ORDER — ELVITEG-COBIC-EMTRICIT-TENOFDF 150-150-200-300 MG PO TABS: 1.0000 | ORAL_TABLET | Freq: Every day | ORAL | Status: DC

## 2013-09-21 NOTE — Telephone Encounter (Signed)
Phone call to [employer].  Obtained info on how to expedite pt obtaining Stribild.  [place] [facility] [place] and gave them the rx.  [facility] able to initiate co-pay assistance for pt.  [facility] will contact pt to arrange delivery for tomorrow.  Called pt to let him know to expect a phone call from [facility] to arrange delivery and that co-pay assistance has been arranged.  Pt verbalized understanding and thanked this Charity fundraiserN.

## 2013-09-23 ENCOUNTER — Other Ambulatory Visit: Payer: Self-pay | Admitting: *Deleted

## 2013-09-23 ENCOUNTER — Ambulatory Visit: Payer: Self-pay | Admitting: Cardiovascular Disease

## 2013-09-23 DIAGNOSIS — B20 Human immunodeficiency virus [HIV] disease: Secondary | ICD-10-CM

## 2013-09-23 LAB — HIV-1 INTEGRASE GENOTYPE
DATE VIRAL LOAD COLLECTED: NO GROWTH
Value last viral load: NO GROWTH

## 2013-09-23 LAB — HLA B*5701: HLA B 5701: NEGATIVE

## 2013-09-23 MED ORDER — ELVITEG-COBIC-EMTRICIT-TENOFDF 150-150-200-300 MG PO TABS: 1.0000 | ORAL_TABLET | Freq: Every day | ORAL | Status: AC

## 2013-09-26 ENCOUNTER — Telehealth: Payer: Self-pay | Admitting: *Deleted

## 2013-09-26 NOTE — Telephone Encounter (Signed)
Patient felt like he had an anxiety attack at work this morning.  Pt states he went in to try to get some work done, but began feeling a little light headed and "woozy" on his drive.  Once he got to work, a Radio broadcast assistantcoworker mentioned his weight loss and stated that he looked sick.  Patient states he started feeling worse after this interaction, felt like his pulse was racing, so he came back home.  Patient denies SOB, states his O2 level is 93 or better, that he is feeling better being at home.  Pt states that he feels a bit overwhelmed by his recent diagnosis.  RN counseled patient, advised patient that if his symptoms return, he should go to the ED or a walk-in clinic for assessment. Patient verbalized understanding, stated he felt much better, that he would rather just wait until he saw Dr. Ninetta LightsHatcher in the morning. Andree CossHowell, Tawana Pasch M, RN

## 2013-09-27 ENCOUNTER — Encounter: Payer: Self-pay | Admitting: Infectious Diseases

## 2013-09-27 ENCOUNTER — Ambulatory Visit (INDEPENDENT_AMBULATORY_CARE_PROVIDER_SITE_OTHER): Payer: BC Managed Care – PPO | Admitting: Infectious Diseases

## 2013-09-27 VITALS — BP 135/81 | HR 97 | Temp 98.2°F | Wt 162.0 lb

## 2013-09-27 DIAGNOSIS — B259 Cytomegaloviral disease, unspecified: Secondary | ICD-10-CM

## 2013-09-27 DIAGNOSIS — B20 Human immunodeficiency virus [HIV] disease: Secondary | ICD-10-CM

## 2013-09-27 DIAGNOSIS — B59 Pneumocystosis: Secondary | ICD-10-CM | POA: Insufficient documentation

## 2013-09-27 DIAGNOSIS — K625 Hemorrhage of anus and rectum: Secondary | ICD-10-CM

## 2013-09-27 LAB — CBC WITH DIFFERENTIAL/PLATELET
BASOS PCT: 0 % (ref 0–1)
Basophils Absolute: 0 10*3/uL (ref 0.0–0.1)
Eosinophils Absolute: 0.1 10*3/uL (ref 0.0–0.7)
Eosinophils Relative: 2 % (ref 0–5)
HCT: 29.8 % — ABNORMAL LOW (ref 39.0–52.0)
Hemoglobin: 10.5 g/dL — ABNORMAL LOW (ref 13.0–17.0)
Lymphocytes Relative: 6 % — ABNORMAL LOW (ref 12–46)
Lymphs Abs: 0.4 10*3/uL — ABNORMAL LOW (ref 0.7–4.0)
MCH: 29.2 pg (ref 26.0–34.0)
MCHC: 35.2 g/dL (ref 30.0–36.0)
MCV: 83 fL (ref 78.0–100.0)
Monocytes Absolute: 0.2 10*3/uL (ref 0.1–1.0)
Monocytes Relative: 3 % (ref 3–12)
NEUTROS ABS: 6.1 10*3/uL (ref 1.7–7.7)
NEUTROS PCT: 89 % — AB (ref 43–77)
Platelets: 207 10*3/uL (ref 150–400)
RBC: 3.59 MIL/uL — ABNORMAL LOW (ref 4.22–5.81)
RDW: 14.5 % (ref 11.5–15.5)
WBC: 6.8 10*3/uL (ref 4.0–10.5)

## 2013-09-27 LAB — COMPREHENSIVE METABOLIC PANEL
ALK PHOS: 46 U/L (ref 39–117)
ALT: 40 U/L (ref 0–53)
AST: 33 U/L (ref 0–37)
Albumin: 3.1 g/dL — ABNORMAL LOW (ref 3.5–5.2)
BILIRUBIN TOTAL: 0.3 mg/dL (ref 0.2–1.2)
BUN: 16 mg/dL (ref 6–23)
CO2: 24 mEq/L (ref 19–32)
Calcium: 8.3 mg/dL — ABNORMAL LOW (ref 8.4–10.5)
Chloride: 97 mEq/L (ref 96–112)
Creat: 0.89 mg/dL (ref 0.50–1.35)
GLUCOSE: 96 mg/dL (ref 70–99)
POTASSIUM: 4.3 meq/L (ref 3.5–5.3)
SODIUM: 129 meq/L — AB (ref 135–145)
TOTAL PROTEIN: 6.3 g/dL (ref 6.0–8.3)

## 2013-09-27 MED ORDER — AZITHROMYCIN 600 MG PO TABS: 1200.0000 mg | ORAL_TABLET | ORAL | Status: DC

## 2013-09-27 MED ORDER — SULFAMETHOXAZOLE-TMP DS 800-160 MG PO TABS: 1.0000 | ORAL_TABLET | Freq: Every day | ORAL | Status: AC

## 2013-09-27 MED ORDER — ZOLPIDEM TARTRATE 10 MG PO TABS
10.0000 mg | ORAL_TABLET | Freq: Every evening | ORAL | Status: AC | PRN
Start: 2013-09-27 — End: 2013-10-27

## 2013-09-27 MED ORDER — PREDNISONE 20 MG PO TABS: ORAL_TABLET | ORAL | Status: AC

## 2013-09-27 NOTE — Progress Notes (Signed)
Patient ID: William MustBrent Christner, male   DOB: 03/16/1966, 48 y.o.   MRN: [id number] HPI: William Bender is a 48 y.o. 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   Allergies: Allergies  Allergen Reactions  . Penicillins Rash    Vitals: Temp: 98.2 F (36.8 C) (03/31 0941) Temp src: Oral (03/31 0941) BP: 135/81 mmHg (03/31 0941) Pulse Rate: 97 (03/31 0941)  Past Medical History: Past Medical History  Diagnosis Date  . Hypertension   . Hyperlipidemia   . Chronic headaches     Social History: History   Social History  . Marital Status: Married    Spouse Name: N/A    Number of Children: N/A  . Years of Education: N/A   Occupational History  . Education Admin    Social History Main Topics  . Smoking status: Never Smoker   . Smokeless tobacco: Never Used  . Alcohol Use: No  . Drug Use: No  . Sexual Activity: Not Currently   Other Topics Concern  . None   Social History Narrative  . None    Previous Regimen: None   Current Regimen: Stribild  Labs: HIV 1 RNA Quant (copies/mL)  Date Value  09/13/2013 [id number]*     CD4 T Cell Abs (/uL)  Date Value  09/13/2013 60*     Hepatitis B Surface Ag (no units)  Date Value  09/14/2013 NEGATIVE      HCV Ab (no units)  Date Value  09/14/2013 NEGATIVE     CrCl: The CrCl is unknown because both a height and weight (above a minimum accepted value) are required for this calculation.  Lipids:    Component Value Date/Time   CHOL 143 09/14/2013 1726   TRIG 146 09/14/2013 1726   HDL 22* 09/14/2013 1726   CHOLHDL 6.5 09/14/2013 1726   VLDL 29 09/14/2013 1726   LDLCALC 92 09/14/2013 1726    Assessment: Pt was a recently dx HIV case. He was started on Stribild for his ART. After talking to him, he seemed to be very compliant with his meds. He and his wife asked about the drug itself and all of their questions were answered. Really stressed the importance of compliance here.   Recommendations: Cont Stribild 1 tab PO qday  Clide CliffPham,  Minh Quang, PharmD Clinical Infectious Disease Pharmacist [facility] 09/27/2013, 1:23 PM

## 2013-09-27 NOTE — Progress Notes (Signed)
   Subjective:    Patient ID: William Bender, male    DOB: 10/01/1965, 48 y.o.   MRN: [id number]  HPI 48 yo M with hx of shingles in 2014, who has had worsening DOE over the last 2 months. He underwent CT chest 09/02/13 which was unrevealing. He was sent to Pulmonary on 3-17 and was found to have SpO2 in the 63 and was emergently admitted. His CXR showed: Streaky bilateral infrahilar and basilar atelectasis or infiltrate  with worsening in aeration  He was then found to be HIV+. Influenza (-)  He was started on bactrim and solumedrol. He is now found to have CD4 60.  He is married, has 2 children (16 and 12). They are healthy. He and his wife deny any HIV specific risk factors except for a vague incident that happened 20-25 years ago. He had PCP stain (-) but was tx for PCP and improved. Was started on stribilid.  Still feels like he has low level chills, weakness. Had ~ panic attack when he went back to work when asked about his wt loss.   HIV 1 RNA Quant (copies/mL)  Date Value  09/13/2013 [id number]*     CD4 T Cell Abs (/uL)  Date Value  09/13/2013 60*   Feels like his vision is worse, having difficulty concentrating/mindfullness. Has had anxiety as well, felt "spacey" while driving.  Had blood in stool for last 4 days. Has decreased (but persisted since then).  No straining, no constipation.   Review of Systems  Constitutional: Positive for unexpected weight change. Negative for appetite change.  HENT: Negative for mouth sores and trouble swallowing.   Eyes: Positive for visual disturbance.  Respiratory: Positive for shortness of breath.        DOE, increased RR when sleeping per wife.   Cardiovascular: Negative for chest pain.  Gastrointestinal: Positive for blood in stool. Negative for diarrhea and constipation.  Genitourinary: Negative for dysuria.       Objective:   Physical Exam  Constitutional: He appears well-developed and well-nourished.  HENT:  Mouth/Throat: No  oropharyngeal exudate.  Eyes: EOM are normal. Pupils are equal, round, and reactive to light.  Neck: Neck supple.  Cardiovascular: Normal rate, regular rhythm and normal heart sounds.   Pulmonary/Chest: He is in respiratory distress. He has wheezes.  Abdominal: Soft. Bowel sounds are normal. He exhibits no distension. There is no tenderness.  No hemmerhoids.   Lymphadenopathy:    He has no cervical adenopathy.  Skin:             Assessment & Plan:

## 2013-09-27 NOTE — Assessment & Plan Note (Signed)
Will change him to qday at end of therapy.

## 2013-09-27 NOTE — Assessment & Plan Note (Signed)
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 My expectation was that improving his immune status would cure this, he may need valcyte.

## 2013-09-27 NOTE — Assessment & Plan Note (Signed)
Will have him seen by GI 

## 2013-09-27 NOTE — Assessment & Plan Note (Addendum)
Will check his CMP, CBC, CD4, VL and genotype. Encouraged him to boost his nutrition (protein shakes, ensure/boost... ). Will see him back in 3-4 weeks.

## 2013-09-28 LAB — HIV-1 RNA ULTRAQUANT REFLEX TO GENTYP+
HIV 1 RNA QUANT: 13616 {copies}/mL — AB (ref <20)
HIV-1 RNA QUANT, LOG: 4.13 {Log} — AB (ref <1.30)

## 2013-09-28 LAB — T-HELPER CELL (CD4) - (RCID CLINIC ONLY)
CD4 % Helper T Cell: 9 % — ABNORMAL LOW (ref 33–55)
CD4 T Cell Abs: 30 /uL — ABNORMAL LOW (ref 400–2700)

## 2013-09-29 ENCOUNTER — Telehealth: Payer: Self-pay | Admitting: *Deleted

## 2013-09-29 ENCOUNTER — Telehealth: Payer: Self-pay

## 2013-09-29 NOTE — Telephone Encounter (Signed)
Called the patient to advise him of the appts [facility] for [facility]/18/13 at 2 pm with Dr Evette CristalGanem. Gave him the phone number and address. Also made him an [facility] for 10/03/13 at 930 am with Dr Randon GoldsmithLyles gave him the address and phone number also.

## 2013-09-29 NOTE — Telephone Encounter (Signed)
Dr Randon GoldsmithLyles office calling for office notes.  Appointment scheduled for 10-03-13 from Dr Drue SecondSnider.  Notes faxed to  [phone] per office request.    Laurell Josephsammy K King, RN

## 2013-09-30 ENCOUNTER — Telehealth: Payer: Self-pay | Admitting: Infectious Disease

## 2013-09-30 NOTE — Telephone Encounter (Signed)
Pt and his wife called tonight. He was suffering from worsening tachycardia and axniety and insomnia  He had prematurely dropped his bactrim to one a day which he was not yet supposed to do.  I asked him to go back to bactrim DS Two tid  And to increase his steroids back up to 20mg  dailiy  He can continue his Remus Lofflerambien and also take benadryl prn  He may need to be seen this week if not then week afterwards to ensure he is doing ok  POX on home machine was 97% but he did have some tachcardia into 120s

## 2013-10-01 ENCOUNTER — Inpatient Hospital Stay (HOSPITAL_COMMUNITY): Payer: BC Managed Care – PPO

## 2013-10-01 ENCOUNTER — Emergency Department (HOSPITAL_COMMUNITY): Payer: BC Managed Care – PPO

## 2013-10-01 ENCOUNTER — Encounter (HOSPITAL_COMMUNITY): Payer: Self-pay | Admitting: Emergency Medicine

## 2013-10-01 ENCOUNTER — Inpatient Hospital Stay (HOSPITAL_COMMUNITY)
Admission: EM | Admit: 2013-10-01 | Discharge: 2013-11-28 | DRG: 004 | Disposition: E | Payer: BC Managed Care – PPO | Attending: Internal Medicine | Admitting: Internal Medicine

## 2013-10-01 DIAGNOSIS — Z8249 Family history of ischemic heart disease and other diseases of the circulatory system: Secondary | ICD-10-CM

## 2013-10-01 DIAGNOSIS — B59 Pneumocystosis: Secondary | ICD-10-CM

## 2013-10-01 DIAGNOSIS — I498 Other specified cardiac arrhythmias: Secondary | ICD-10-CM | POA: Diagnosis present

## 2013-10-01 DIAGNOSIS — Z880 Allergy status to penicillin: Secondary | ICD-10-CM

## 2013-10-01 DIAGNOSIS — H538 Other visual disturbances: Secondary | ICD-10-CM | POA: Diagnosis present

## 2013-10-01 DIAGNOSIS — J9600 Acute respiratory failure, unspecified whether with hypoxia or hypercapnia: Secondary | ICD-10-CM

## 2013-10-01 DIAGNOSIS — Z7982 Long term (current) use of aspirin: Secondary | ICD-10-CM

## 2013-10-01 DIAGNOSIS — B259 Cytomegaloviral disease, unspecified: Secondary | ICD-10-CM

## 2013-10-01 DIAGNOSIS — E871 Hypo-osmolality and hyponatremia: Secondary | ICD-10-CM

## 2013-10-01 DIAGNOSIS — Z79899 Other long term (current) drug therapy: Secondary | ICD-10-CM

## 2013-10-01 DIAGNOSIS — J939 Pneumothorax, unspecified: Secondary | ICD-10-CM

## 2013-10-01 DIAGNOSIS — E46 Unspecified protein-calorie malnutrition: Secondary | ICD-10-CM | POA: Diagnosis present

## 2013-10-01 DIAGNOSIS — L89109 Pressure ulcer of unspecified part of back, unspecified stage: Secondary | ICD-10-CM

## 2013-10-01 DIAGNOSIS — E876 Hypokalemia: Secondary | ICD-10-CM | POA: Diagnosis present

## 2013-10-01 DIAGNOSIS — D638 Anemia in other chronic diseases classified elsewhere: Secondary | ICD-10-CM | POA: Diagnosis present

## 2013-10-01 DIAGNOSIS — F411 Generalized anxiety disorder: Secondary | ICD-10-CM | POA: Diagnosis present

## 2013-10-01 DIAGNOSIS — R918 Other nonspecific abnormal finding of lung field: Secondary | ICD-10-CM

## 2013-10-01 DIAGNOSIS — T380X5A Adverse effect of glucocorticoids and synthetic analogues, initial encounter: Secondary | ICD-10-CM | POA: Diagnosis present

## 2013-10-01 DIAGNOSIS — L89152 Pressure ulcer of sacral region, stage 2: Secondary | ICD-10-CM | POA: Diagnosis present

## 2013-10-01 DIAGNOSIS — I10 Essential (primary) hypertension: Secondary | ICD-10-CM | POA: Diagnosis present

## 2013-10-01 DIAGNOSIS — G929 Unspecified toxic encephalopathy: Secondary | ICD-10-CM | POA: Diagnosis not present

## 2013-10-01 DIAGNOSIS — R7309 Other abnormal glucose: Secondary | ICD-10-CM | POA: Diagnosis present

## 2013-10-01 DIAGNOSIS — K625 Hemorrhage of anus and rectum: Secondary | ICD-10-CM

## 2013-10-01 DIAGNOSIS — E785 Hyperlipidemia, unspecified: Secondary | ICD-10-CM | POA: Diagnosis present

## 2013-10-01 DIAGNOSIS — E870 Hyperosmolality and hypernatremia: Secondary | ICD-10-CM | POA: Diagnosis present

## 2013-10-01 DIAGNOSIS — J80 Acute respiratory distress syndrome: Secondary | ICD-10-CM

## 2013-10-01 DIAGNOSIS — J982 Interstitial emphysema: Secondary | ICD-10-CM

## 2013-10-01 DIAGNOSIS — L8992 Pressure ulcer of unspecified site, stage 2: Secondary | ICD-10-CM | POA: Diagnosis present

## 2013-10-01 DIAGNOSIS — B49 Unspecified mycosis: Secondary | ICD-10-CM

## 2013-10-01 DIAGNOSIS — G4700 Insomnia, unspecified: Secondary | ICD-10-CM

## 2013-10-01 DIAGNOSIS — Z515 Encounter for palliative care: Secondary | ICD-10-CM

## 2013-10-01 DIAGNOSIS — IMO0002 Reserved for concepts with insufficient information to code with codable children: Secondary | ICD-10-CM

## 2013-10-01 DIAGNOSIS — J9690 Respiratory failure, unspecified, unspecified whether with hypoxia or hypercapnia: Secondary | ICD-10-CM

## 2013-10-01 DIAGNOSIS — R6520 Severe sepsis without septic shock: Secondary | ICD-10-CM

## 2013-10-01 DIAGNOSIS — B20 Human immunodeficiency virus [HIV] disease: Secondary | ICD-10-CM

## 2013-10-01 DIAGNOSIS — J9383 Other pneumothorax: Secondary | ICD-10-CM

## 2013-10-01 DIAGNOSIS — E236 Other disorders of pituitary gland: Secondary | ICD-10-CM | POA: Diagnosis present

## 2013-10-01 DIAGNOSIS — B250 Cytomegaloviral pneumonitis: Secondary | ICD-10-CM | POA: Diagnosis present

## 2013-10-01 DIAGNOSIS — J95811 Postprocedural pneumothorax: Secondary | ICD-10-CM | POA: Diagnosis not present

## 2013-10-01 DIAGNOSIS — E873 Alkalosis: Secondary | ICD-10-CM | POA: Diagnosis present

## 2013-10-01 DIAGNOSIS — J189 Pneumonia, unspecified organism: Secondary | ICD-10-CM

## 2013-10-01 DIAGNOSIS — B377 Candidal sepsis: Secondary | ICD-10-CM | POA: Diagnosis present

## 2013-10-01 DIAGNOSIS — A419 Sepsis, unspecified organism: Secondary | ICD-10-CM | POA: Diagnosis not present

## 2013-10-01 DIAGNOSIS — Z66 Do not resuscitate: Secondary | ICD-10-CM | POA: Diagnosis not present

## 2013-10-01 DIAGNOSIS — L89501 Pressure ulcer of unspecified ankle, stage 1: Secondary | ICD-10-CM

## 2013-10-01 DIAGNOSIS — G92 Toxic encephalopathy: Secondary | ICD-10-CM | POA: Diagnosis not present

## 2013-10-01 DIAGNOSIS — J122 Parainfluenza virus pneumonia: Secondary | ICD-10-CM | POA: Diagnosis present

## 2013-10-01 DIAGNOSIS — F4323 Adjustment disorder with mixed anxiety and depressed mood: Secondary | ICD-10-CM

## 2013-10-01 HISTORY — DX: Human immunodeficiency virus (HIV) disease: B20

## 2013-10-01 HISTORY — DX: Asymptomatic human immunodeficiency virus (hiv) infection status: Z21

## 2013-10-01 LAB — BLOOD GAS, ARTERIAL
Acid-base deficit: 1.7 mmol/L (ref 0.0–2.0)
Acid-base deficit: 2.4 mmol/L — ABNORMAL HIGH (ref 0.0–2.0)
Bicarbonate: 21 mEq/L (ref 20.0–24.0)
Bicarbonate: 20.3 mEq/L (ref 20.0–24.0)
DRAWN BY: 317871
Drawn by: 307971
FIO2: 1 %
O2 Content: 3.5 L/min
O2 SAT: 97.9 %
O2 Saturation: 94.4 %
PATIENT TEMPERATURE: 102.8
PO2 ART: 121 mmHg — AB (ref 80.0–100.0)
Patient temperature: 100.8
TCO2: 19.6 mmol/L (ref 0–100)
TCO2: 18.8 mmol/L (ref 0–100)
pCO2 arterial: 33.1 mmHg — ABNORMAL LOW (ref 35.0–45.0)
pCO2 arterial: 31.1 mmHg — ABNORMAL LOW (ref 35.0–45.0)
pH, Arterial: 7.431 (ref 7.350–7.450)
pH, Arterial: 7.437 (ref 7.350–7.450)
pO2, Arterial: 92.5 mmHg (ref 80.0–100.0)

## 2013-10-01 LAB — URINALYSIS, ROUTINE W REFLEX MICROSCOPIC
BILIRUBIN URINE: NEGATIVE
Glucose, UA: NEGATIVE mg/dL
HGB URINE DIPSTICK: NEGATIVE
Ketones, ur: NEGATIVE mg/dL
Leukocytes, UA: NEGATIVE
Nitrite: NEGATIVE
PROTEIN: NEGATIVE mg/dL
Specific Gravity, Urine: 1.007 (ref 1.005–1.030)
Urobilinogen, UA: 0.2 mg/dL (ref 0.0–1.0)
pH: 6.5 (ref 5.0–8.0)

## 2013-10-01 LAB — CBC WITH DIFFERENTIAL/PLATELET
Basophils Absolute: 0.1 10*3/uL (ref 0.0–0.1)
Basophils Relative: 1 % (ref 0–1)
EOS ABS: 0.1 10*3/uL (ref 0.0–0.7)
EOS PCT: 1 % (ref 0–5)
HCT: 29.3 % — ABNORMAL LOW (ref 39.0–52.0)
Hemoglobin: 10.1 g/dL — ABNORMAL LOW (ref 13.0–17.0)
Lymphocytes Relative: 9 % — ABNORMAL LOW (ref 12–46)
Lymphs Abs: 0.5 10*3/uL — ABNORMAL LOW (ref 0.7–4.0)
MCH: 30.1 pg (ref 26.0–34.0)
MCHC: 34.5 g/dL (ref 30.0–36.0)
MCV: 87.5 fL (ref 78.0–100.0)
Monocytes Absolute: 0.2 10*3/uL (ref 0.1–1.0)
Monocytes Relative: 3 % (ref 3–12)
NEUTROS PCT: 86 % — AB (ref 43–77)
Neutro Abs: 4.9 10*3/uL (ref 1.7–7.7)
PLATELETS: 219 10*3/uL (ref 150–400)
RBC: 3.35 MIL/uL — ABNORMAL LOW (ref 4.22–5.81)
RDW: 14.6 % (ref 11.5–15.5)
WBC: 5.7 10*3/uL (ref 4.0–10.5)

## 2013-10-01 LAB — COMPREHENSIVE METABOLIC PANEL
ALK PHOS: 53 U/L (ref 39–117)
ALT: 47 U/L (ref 0–53)
AST: 43 U/L — AB (ref 0–37)
Albumin: 2.5 g/dL — ABNORMAL LOW (ref 3.5–5.2)
BUN: 20 mg/dL (ref 6–23)
CO2: 20 mEq/L (ref 19–32)
Calcium: 8.2 mg/dL — ABNORMAL LOW (ref 8.4–10.5)
Chloride: 93 mEq/L — ABNORMAL LOW (ref 96–112)
Creatinine, Ser: 1.04 mg/dL (ref 0.50–1.35)
GFR calc Af Amer: >90 mL/min (ref >90)
GFR calc non Af Amer: 84 mL/min — ABNORMAL LOW (ref >90)
Glucose, Bld: 131 mg/dL — ABNORMAL HIGH (ref 70–99)
POTASSIUM: 4.1 meq/L (ref 3.7–5.3)
SODIUM: 126 meq/L — AB (ref 137–147)
TOTAL PROTEIN: 6.6 g/dL (ref 6.0–8.3)
Total Bilirubin: 0.2 mg/dL — ABNORMAL LOW (ref 0.3–1.2)

## 2013-10-01 LAB — D-DIMER, QUANTITATIVE (NOT AT ARMC): D DIMER QUANT: 2.88 ug{FEU}/mL — AB (ref 0.00–0.48)

## 2013-10-01 LAB — TROPONIN I: Troponin I: <0.3 ng/mL (ref <0.30)

## 2013-10-01 LAB — I-STAT CG4 LACTIC ACID, ED: Lactic Acid, Venous: 1.56 mmol/L (ref 0.5–2.2)

## 2013-10-01 LAB — PRO B NATRIURETIC PEPTIDE: PRO B NATRI PEPTIDE: 460.5 pg/mL — AB (ref 0–125)

## 2013-10-01 LAB — LACTATE DEHYDROGENASE: LDH: 395 U/L — ABNORMAL HIGH (ref 94–250)

## 2013-10-01 MED ORDER — ACETAMINOPHEN 325 MG PO TABS
650.0000 mg | ORAL_TABLET | Freq: Once | ORAL | Status: AC
  Administered 2013-10-01: 650 mg via ORAL
  Filled 2013-10-01: qty 2

## 2013-10-01 MED ORDER — VANCOMYCIN HCL 10 G IV SOLR
1500.0000 mg | Freq: Once | INTRAVENOUS | Status: AC
  Administered 2013-10-01: 1500 mg via INTRAVENOUS
  Filled 2013-10-01: qty 1500

## 2013-10-01 MED ORDER — SENNOSIDES-DOCUSATE SODIUM 8.6-50 MG PO TABS: 2.0000 | ORAL_TABLET | Freq: Every evening | ORAL | Status: DC | PRN

## 2013-10-01 MED ORDER — SODIUM CHLORIDE 0.9 % IV SOLN
Freq: Once | INTRAVENOUS | Status: AC
  Administered 2013-10-01: 13:00:00 via INTRAVENOUS
  Administered 2013-10-02: 500 mL via INTRAVENOUS

## 2013-10-01 MED ORDER — DEXTROSE 5 % IV SOLN
2.0000 g | Freq: Two times a day (BID) | INTRAVENOUS | Status: DC
  Administered 2013-10-01: 2 g via INTRAVENOUS

## 2013-10-01 MED ORDER — CLONIDINE HCL 0.1 MG PO TABS
0.1000 mg | ORAL_TABLET | Freq: Three times a day (TID) | ORAL | Status: DC
  Administered 2013-10-01 (×2): 0.1 mg via ORAL
  Filled 2013-10-01 (×8): qty 1

## 2013-10-01 MED ORDER — SULFAMETHOXAZOLE-TRIMETHOPRIM 400-80 MG/5ML IV SOLN
450.0000 mg | Freq: Once | INTRAVENOUS | Status: AC
  Administered 2013-10-01: 450 mg via INTRAVENOUS
  Filled 2013-10-01: qty 28.1

## 2013-10-01 MED ORDER — AZITHROMYCIN 600 MG PO TABS
1200.0000 mg | ORAL_TABLET | ORAL | Status: DC
  Administered 2013-10-06 – 2013-10-20 (×3): 1200 mg via ORAL
  Filled 2013-10-01 (×3): qty 2

## 2013-10-01 MED ORDER — ENOXAPARIN SODIUM 40 MG/0.4ML ~~LOC~~ SOLN
40.0000 mg | SUBCUTANEOUS | Status: DC
  Administered 2013-10-01 – 2013-10-09 (×9): 40 mg via SUBCUTANEOUS
  Filled 2013-10-01 (×10): qty 0.4

## 2013-10-01 MED ORDER — ROSUVASTATIN CALCIUM 10 MG PO TABS
10.0000 mg | ORAL_TABLET | Freq: Every day | ORAL | Status: DC
  Administered 2013-10-01 – 2013-10-03 (×3): 10 mg via ORAL
  Filled 2013-10-01 (×4): qty 1

## 2013-10-01 MED ORDER — ALPRAZOLAM 0.25 MG PO TABS
0.2500 mg | ORAL_TABLET | Freq: Two times a day (BID) | ORAL | Status: DC | PRN
  Administered 2013-10-01 – 2013-10-02 (×2): 0.25 mg via ORAL
  Filled 2013-10-01 (×2): qty 1

## 2013-10-01 MED ORDER — VANCOMYCIN HCL IN DEXTROSE 750-5 MG/150ML-% IV SOLN
750.0000 mg | Freq: Three times a day (TID) | INTRAVENOUS | Status: DC
  Filled 2013-10-01: qty 150

## 2013-10-01 MED ORDER — PANTOPRAZOLE SODIUM 40 MG PO TBEC
40.0000 mg | DELAYED_RELEASE_TABLET | Freq: Every day | ORAL | Status: DC
  Administered 2013-10-01: 40 mg via ORAL
  Filled 2013-10-01 (×2): qty 1

## 2013-10-01 MED ORDER — SODIUM CHLORIDE 0.9 % IV BOLUS (SEPSIS)
1000.0000 mL | Freq: Once | INTRAVENOUS | Status: AC
  Administered 2013-10-01: 1000 mL via INTRAVENOUS

## 2013-10-01 MED ORDER — ACETAMINOPHEN 500 MG PO TABS
1000.0000 mg | ORAL_TABLET | Freq: Four times a day (QID) | ORAL | Status: DC | PRN
  Administered 2013-10-01 – 2013-10-02 (×3): 1000 mg via ORAL
  Filled 2013-10-01 (×3): qty 2

## 2013-10-01 MED ORDER — METHYLPREDNISOLONE SODIUM SUCC 125 MG IJ SOLR
80.0000 mg | Freq: Once | INTRAMUSCULAR | Status: AC
  Administered 2013-10-01: 80 mg via INTRAVENOUS
  Filled 2013-10-01: qty 2

## 2013-10-01 MED ORDER — SODIUM CHLORIDE 0.9 % IV SOLN
250.0000 mL | INTRAVENOUS | Status: DC | PRN
  Administered 2013-10-02 – 2013-10-05 (×2): 500 mL via INTRAVENOUS
  Administered 2013-10-12 (×2): 250 mL via INTRAVENOUS
  Administered 2013-10-16: 500 mL via INTRAVENOUS
  Administered 2013-10-18: 250 mL via INTRAVENOUS

## 2013-10-01 MED ORDER — VANCOMYCIN HCL IN DEXTROSE 1-5 GM/200ML-% IV SOLN
1000.0000 mg | Freq: Once | INTRAVENOUS | Status: DC
  Filled 2013-10-01: qty 200

## 2013-10-01 MED ORDER — TRAMADOL HCL 50 MG PO TABS: 50.0000 mg | ORAL_TABLET | Freq: Four times a day (QID) | ORAL | Status: DC | PRN

## 2013-10-01 MED ORDER — ZOLPIDEM TARTRATE 10 MG PO TABS
10.0000 mg | ORAL_TABLET | Freq: Every evening | ORAL | Status: DC | PRN
  Administered 2013-10-02: 10 mg via ORAL
  Filled 2013-10-01: qty 1

## 2013-10-01 MED ORDER — SODIUM CHLORIDE 0.9 % IV SOLN
Freq: Once | INTRAVENOUS | Status: AC
  Administered 2013-10-01: 10:00:00 via INTRAVENOUS

## 2013-10-01 MED ORDER — IOHEXOL 350 MG/ML SOLN
100.0000 mL | Freq: Once | INTRAVENOUS | Status: AC | PRN
  Administered 2013-10-01: 100 mL via INTRAVENOUS

## 2013-10-01 MED ORDER — ALBUTEROL SULFATE HFA 108 (90 BASE) MCG/ACT IN AERS: 1.0000 | INHALATION_SPRAY | Freq: Four times a day (QID) | RESPIRATORY_TRACT | Status: DC | PRN

## 2013-10-01 MED ORDER — ASPIRIN EC 81 MG PO TBEC
81.0000 mg | DELAYED_RELEASE_TABLET | Freq: Every day | ORAL | Status: DC
  Administered 2013-10-01: 81 mg via ORAL
  Filled 2013-10-01 (×2): qty 1

## 2013-10-01 MED ORDER — SULFAMETHOXAZOLE-TRIMETHOPRIM 400-80 MG/5ML IV SOLN
450.0000 mg | Freq: Three times a day (TID) | INTRAVENOUS | Status: DC
  Administered 2013-10-01 – 2013-10-18 (×51): 450 mg via INTRAVENOUS
  Filled 2013-10-01 (×54): qty 28.1

## 2013-10-01 MED ORDER — ELVITEG-COBIC-EMTRICIT-TENOFDF 150-150-200-300 MG PO TABS
1.0000 | ORAL_TABLET | Freq: Every day | ORAL | Status: DC
Start: 2013-10-02 — End: 2013-10-02
  Administered 2013-10-02: 1 via ORAL
  Filled 2013-10-01 (×2): qty 1

## 2013-10-01 MED ORDER — ALBUTEROL SULFATE (2.5 MG/3ML) 0.083% IN NEBU: 3.0000 mL | INHALATION_SOLUTION | Freq: Four times a day (QID) | RESPIRATORY_TRACT | Status: DC | PRN

## 2013-10-01 MED ORDER — PREDNISONE 20 MG PO TABS
40.0000 mg | ORAL_TABLET | Freq: Two times a day (BID) | ORAL | Status: DC
  Administered 2013-10-01 – 2013-10-02 (×2): 40 mg via ORAL
  Filled 2013-10-01 (×4): qty 2

## 2013-10-01 MED ORDER — ATORVASTATIN CALCIUM 20 MG PO TABS: 20.0000 mg | ORAL_TABLET | Freq: Every day | ORAL | Status: DC

## 2013-10-01 MED ORDER — DM-GUAIFENESIN ER 30-600 MG PO TB12
1.0000 | ORAL_TABLET | Freq: Two times a day (BID) | ORAL | Status: DC | PRN
  Administered 2013-10-01: 1 via ORAL
  Filled 2013-10-01: qty 1

## 2013-10-01 MED ORDER — CEFEPIME HCL 1 G IJ SOLR
1.0000 g | Freq: Three times a day (TID) | INTRAMUSCULAR | Status: DC
  Administered 2013-10-01 – 2013-10-08 (×20): 1 g via INTRAVENOUS
  Filled 2013-10-01 (×22): qty 1

## 2013-10-01 MED ORDER — DIPHENHYDRAMINE HCL 25 MG PO CAPS: 25.0000 mg | ORAL_CAPSULE | Freq: Four times a day (QID) | ORAL | Status: DC | PRN

## 2013-10-01 MED ORDER — SODIUM CHLORIDE 0.9 % IV SOLN
INTRAVENOUS | Status: AC
  Administered 2013-10-01: 20:00:00 via INTRAVENOUS
  Administered 2013-10-02: 1000 mL via INTRAVENOUS
  Administered 2013-10-02: 04:00:00 via INTRAVENOUS

## 2013-10-01 NOTE — ED Notes (Signed)
Bed: WA09 Expected date:  Expected time:  Means of arrival:  Comments: triage 

## 2013-10-01 NOTE — ED Provider Notes (Signed)
CSN: [id number]     Arrival date & time Oct 18, 2013  4098 History   First MD Initiated Contact with Patient 10-18-2013 0945     Chief Complaint  Patient presents with  . Tachycardia  . Shortness of Breath     (Consider location/radiation/quality/duration/timing/severity/associated sxs/prior Treatment) HPI Comments: Patient presents with one-day history of tachycardia, hypoxia and increased work of breathing. His wife noticed he was breathing quickly last night and his home pulse ox showed 80%. He is a moist cough but no fever or chest pain. He was admitted to the hospital 2 weeks ago for PCP pneumonia he completed a course of Bactrim. He is currently on prednisone 20 mg daily. He  saw his ID doctor last week any inadvertently decreased his Bactrim to one tablet daily when he is supposed to be on 2 tablets 3 times daily. Good by mouth intake and urine output. No vomiting. No abdominal pain. He is not wear oxygen at home.  The history is provided by the patient and the spouse. The history is limited by the condition of the patient.    Past Medical History  Diagnosis Date  . Hypertension   . Hyperlipidemia   . Chronic headaches    Past Surgical History  Procedure Laterality Date  . Bilateral shoulder surgery    . Dental surgery     Family History  Problem Relation Age of Onset  . Heart disease Father   . Heart disease Maternal Grandfather   . Colon cancer Maternal Grandfather    History  Substance Use Topics  . Smoking status: Never Smoker   . Smokeless tobacco: Never Used  . Alcohol Use: No    Review of Systems  Constitutional: Positive for activity change, appetite change and fatigue. Negative for fever.  HENT: Negative for congestion and rhinorrhea.   Respiratory: Positive for cough, chest tightness and shortness of breath.   Cardiovascular: Negative for chest pain.  Gastrointestinal: Negative for nausea, vomiting and abdominal pain.  Genitourinary: Negative for dysuria and  hematuria.  Musculoskeletal: Positive for arthralgias and myalgias. Negative for back pain.  Skin: Negative for rash.  Neurological: Negative for dizziness, weakness and headaches.  A complete 10 system review of systems was obtained and all systems are negative except as noted in the HPI and PMH.      Allergies  Penicillins  Home Medications   No current outpatient prescriptions on file. BP 113/58  Pulse 86  Temp(Src) 102.8 F (39.3 C) (Rectal)  Resp 26  Ht 5\' 10"  (1.778 m)  Wt 155 lb (70.308 kg)  BMI 22.24 kg/m2  SpO2 96% Physical Exam  Constitutional: He is oriented to person, place, and time. He appears well-nourished. He appears distressed.  Mild respiratory distress with tachypnea  HENT:  Head: Normocephalic and atraumatic.  Mouth/Throat: Oropharynx is clear and moist. No oropharyngeal exudate.  Eyes: Conjunctivae and EOM are normal. Pupils are equal, round, and reactive to light.  Neck: Normal range of motion. Neck supple.  Cardiovascular: Normal rate and normal heart sounds.   No murmur heard. Tachycardic  Pulmonary/Chest: Breath sounds normal. He is in respiratory distress.  Decreased breath sounds, no wheezing, increased work of breathing, no accessory muscle use  Abdominal: Soft. There is no tenderness. There is no rebound and no guarding.  Genitourinary:  Stage 2 pressure ulcer to sacrum   Musculoskeletal: Normal range of motion. He exhibits no edema and no tenderness.  No calf swelling or asymmetry  Neurological: He is alert and oriented  to person, place, and time. No cranial nerve deficit. He exhibits normal muscle tone. Coordination normal.  Skin: Skin is warm.    ED Course  Procedures (including critical care time) Labs Review Labs Reviewed  CBC WITH DIFFERENTIAL - Abnormal; Notable for the following:    RBC 3.35 (*)    Hemoglobin 10.1 (*)    HCT 29.3 (*)    Neutrophils Relative % 86 (*)    Lymphocytes Relative 9 (*)    Lymphs Abs 0.5 (*)     All other components within normal limits  COMPREHENSIVE METABOLIC PANEL - Abnormal; Notable for the following:    Sodium 126 (*)    Chloride 93 (*)    Glucose, Bld 131 (*)    Calcium 8.2 (*)    Albumin 2.5 (*)    AST 43 (*)    Total Bilirubin 0.2 (*)    GFR calc non Af Amer 84 (*)    All other components within normal limits  LACTATE DEHYDROGENASE - Abnormal; Notable for the following:    LDH 395 (*)    All other components within normal limits  BLOOD GAS, ARTERIAL - Abnormal; Notable for the following:    pCO2 arterial 33.1 (*)    All other components within normal limits  D-DIMER, QUANTITATIVE - Abnormal; Notable for the following:    D-Dimer, Quant 2.88 (*)    All other components within normal limits  PRO B NATRIURETIC PEPTIDE - Abnormal; Notable for the following:    Pro B Natriuretic peptide (BNP) 460.5 (*)    All other components within normal limits  CULTURE, BLOOD (ROUTINE X 2)  CULTURE, BLOOD (ROUTINE X 2)  PNEUMOCYSTIS JIROVECI SMEAR BY DFA  TROPONIN I  URINALYSIS, ROUTINE W REFLEX MICROSCOPIC  HISTOPLASMA ANTIGEN, URINE  CYTOMEGALOVIRUS PCR, QUALITATIVE  I-STAT CG4 LACTIC ACID, ED   Imaging Review Ct Angio Chest Pe W/cm &/or Wo Cm  09/30/2013   CLINICAL DATA:  Tachycardia and fever  EXAM: CT ANGIOGRAPHY CHEST WITH CONTRAST  TECHNIQUE: Multidetector CT imaging of the chest was performed using the standard protocol during bolus administration of intravenous contrast. Multiplanar CT image reconstructions and MIPs were obtained to evaluate the vascular anatomy.  CONTRAST:  OMNIPAQUE IOHEXOL 350 MG/ML SOLN  COMPARISON:  09/02/2013  FINDINGS: The lungs are well aerated bilaterally and demonstrate diffuse infiltrate throughout the left lung as well as patchy infiltrative changes in the right lung worst in the right middle lobe. These changes are similar to that seen on the recent chest x-ray. The thoracic inlet is within normal limits. 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 The thoracic aorta is otherwise within normal limits. The pulmonary artery is well visualized and shows a normal branching pattern. No intraluminal filling defect to suggest pulmonary embolism is identified. No significant hilar adenopathy is seen. A few scattered mediastinal lymph nodes are noted. The largest of these lies in the aortic or pulmonary window and measures 9 mm in short axis. This is increased in size from the prior exam.  Scanning into the upper abdomen reveals no acute abnormality. No gross bony abnormality is seen.  Review of the MIP images confirms the above findings.  IMPRESSION: No evidence of pulmonary embolism.  Diffuse bilateral infiltrates similar to that seen on recent chest x-ray but new from the prior CT examination. Given the patient's underlying clinical history this may represent a opportunistic infection. Further workup is recommended.  Reactive aorticopulmonary window node on the left.  Stable aberrant  right subclavian artery.   Electronically Signed   By: Alcide CleverMark  Lukens M.D.   On: 10/08/2013 11:43   Dg Chest Portable 1 View  10/15/2013   CLINICAL DATA:  TACHYCARDIA SHORTNESS OF BREATH TACHYCARDIA SHORTNESS OF BREATH  EXAM: PORTABLE CHEST - 1 VIEW  COMPARISON:  DG CHEST 2 VIEW dated 09/19/2013  FINDINGS: Low lung volumes. Cardiac silhouette is within normal limits. Diffuse pulmonary opacities are appreciated within the left hemi thorax and to a lesser extent right lung base. Osteoarthritic changes are appreciated within the shoulders.  IMPRESSION: Increase conspicuity of the diffuse infiltrates left hemi thorax with interval development of a right lower lobe infiltrate.   Electronically Signed   By: Salome HolmesHector  Cooper M.D.   On: 10/13/2013 10:56     EKG Interpretation   Date/Time:  Saturday October 01 2013 09:51:01 EDT Ventricular Rate:  117 PR Interval:  147 QRS Duration: 95 QT Interval:  301 QTC Calculation: 420 R Axis:   107 Text Interpretation:   Sinus tachycardia Right axis deviation Rate faster  Confirmed by Halen Mossbarger  MD, Clovis Mankins ([id number]) on 10/17/2013 10:00:23 AM      MDM   Final diagnoses:  HCAP (healthcare-associated pneumonia)  Respiratory failure   Patient with AIDS and recent diagnosis of PCP pneumonia presenting with tachycardia, hypoxia and increased work of breathing. Initial oxygenation 80 on room air. Increased to 94 on 4 L. EKG sinus tachycardia.  Chest x-ray shows bilateral infiltrates worse on the left. Patient will be treated for health care associated pneumonia. we'll also treat with IV Bactrim for PCP coverage.  ABG shows no CO2 retention. PO2 92 on 6 L oxygen. Patient with increased work of breathing and tachypnea but in no distress.  Code sepsis was called on arrival patient given IV fluids, antibiotics and cultures obtained. CT is not showing evidence of PE. O2 saturation stable on [place]. Work of breathing remains increased. D/w PCCM who will admit.   CRITICAL CARE Performed by: Glynn OctaveANCOUR, Mariaeduarda Defranco Total critical care time: 30 Critical care time was exclusive of separately billable procedures and treating other patients. Critical care was necessary to treat or prevent imminent or life-threatening deterioration. Critical care was time spent personally by me on the following activities: development of treatment plan with patient and/or surrogate as well as nursing, discussions with consultants, evaluation of patient's response to treatment, examination of patient, obtaining history from patient or surrogate, ordering and performing treatments and interventions, ordering and review of laboratory studies, ordering and review of radiographic studies, pulse oximetry and re-evaluation of patient's condition.    Glynn OctaveStephen Treasure Ochs, MD 10/10/2013 1556

## 2013-10-01 NOTE — ED Notes (Signed)
Pt to CT

## 2013-10-01 NOTE — Progress Notes (Signed)
ANTIBIOTIC CONSULT NOTE - INITIAL  Pharmacy Consult for Vanc, Cefepime, Septra IV Indication: Pneumonia (PCP), Sepsis  Allergies  Allergen Reactions  . Penicillins Rash    Patient Measurements: Height: _0  (177.8 cm) Weight: 155 lb (70.308 kg) IBW/kg (Calculated) : 73  Vital Signs: Temp: 102.8 F (39.3 C) (04/04 1029) Temp src: Rectal (04/04 1029) BP: 120/56 mmHg (04/04 1130) Pulse Rate: 101 (04/04 1130)  Labs:  Recent Labs  10/22/2013 1005  WBC 5.7  HGB 10.1*  PLT 219   Estimated Creatinine Clearance: 87.3 ml/min (by C-G formula based on Cr of 1.04).  Microbiology: Recent Results (from the past 720 hour(s))  CULTURE, BLOOD (ROUTINE X 2)     Status: None   Collection Time    09/13/13 11:05 AM      Result Value Ref Range Status   Specimen Description BLOOD LEFT ARM   Final   Special Requests BOTTLES DRAWN AEROBIC AND ANAEROBIC [facility] EACH   Final   Culture  Setup Time     Final   Value: 09/13/2013 14:03     Performed at [facility]   Culture     Final   Value: NO GROWTH 5 DAYS     Performed at [facility]   Report Status 09/19/2013 FINAL   Final  CULTURE, BLOOD (ROUTINE X 2)     Status: None   Collection Time    09/13/13 11:15 AM      Result Value Ref Range Status   Specimen Description BLOOD RIGHT ARM   Final   Special Requests BOTTLES DRAWN AEROBIC AND ANAEROBIC 10CC EACH   Final   Culture  Setup Time     Final   Value: 09/13/2013 14:03     Performed at [facility]   Culture     Final   Value: NO GROWTH 5 DAYS     Performed at [facility]   Report Status 09/19/2013 FINAL   Final  MRSA PCR SCREENING     Status: Abnormal   Collection Time    09/13/13  8:17 PM      Result Value Ref Range Status   MRSA by PCR INVALID RESULTS, SPECIMEN SENT FOR CULTURE (*) NEGATIVE Corrected   Comment: RESULT CALLED TO, READ BACK BY AND VERIFIED WITH:     SPOKE WITH BANKS,T RN [phone] COVINGTON,N     CORRECTED ON 03/18 AT 1155:  PREVIOUSLY REPORTED AS RESULT CALLED TO, READ BACK BY AND VERIFIED WITH: SPOKE WITH BANKS,T RN [phone] COVINGTON,N        The GeneXpert MRSA Assay (FDA approved for NASAL specimens only), is one component of a      comprehensive MRSA colonization surveillance program. It is not intended to diagnose MRSA infection nor to guide or monitor treatment for MRSA infections.  MRSA CULTURE     Status: None   Collection Time    09/13/13  8:17 PM      Result Value Ref Range Status   Specimen Description NASOPHARYNGEAL   Final   Special Requests NONE   Final   Culture     Final   Value: NOMRSA     Performed at [facility]   Report Status 09/16/2013 FINAL   Final  RESPIRATORY VIRUS PANEL     Status: None   Collection Time    09/14/13 12:20 PM      Result Value Ref Range Status   Source - RVPAN NASAL SWAB  Corrected   Comment: CORRECTED ON 03/19 AT 1848: PREVIOUSLY REPORTED AS NASAL SWAB   Respiratory Syncytial Virus A NOT DETECTED   Final   Respiratory Syncytial Virus B NOT DETECTED   Final   Influenza A NOT DETECTED   Final   Influenza B NOT DETECTED   Final   Parainfluenza 1 NOT DETECTED   Final   Parainfluenza 2 NOT DETECTED   Final   Parainfluenza 3 NOT DETECTED   Final   Metapneumovirus NOT DETECTED   Final   Rhinovirus NOT DETECTED   Final   Adenovirus NOT DETECTED   Final   Influenza A H1 NOT DETECTED   Final   Influenza A H3 NOT DETECTED   Final   Comment: (NOTE)           Normal Reference Range for each Analyte: NOT DETECTED     Testing performed using the Luminex xTAG Respiratory Viral Panel test     kit.     This test was developed and its performance characteristics determined     by [facility]. It has not been cleared or approved by the [place]     Food and Drug Administration. This test is used for clinical purposes.     It should not be regarded as investigational or for research. This     laboratory is certified under the [place] (CLIA) as qualified to perform high complexity     clinical laboratory testing.     Performed at [place]     Status: None   Collection Time    09/14/13  9:15 PM      Result Value Ref Range Status   CT Probe RNA NEGATIVE  NEGATIVE Final   GC Probe RNA NEGATIVE  NEGATIVE Final   Comment: (NOTE)                                                                                               **Normal Reference Range: Negative**          Assay performed using the Gen-Probe APTIMA COMBO2 (R) Assay.     Acceptable specimen types for this assay include APTIMA Swabs (Unisex,     endocervical, urethral, or vaginal), first void urine, and ThinPrep     liquid based cytology samples.     Performed at [facility]     Status: None   Collection Time    09/14/13 10:15 PM      Result Value Ref Range Status   Specimen Source-PJSRC SPUTUM   Final   Pneumocystis jiroveci Ag NEGATIVE   Final   Comment: Performed at [facility]    Medical History: Past Medical History  Diagnosis Date  . Hypertension   . Hyperlipidemia   . Chronic headaches     Anti-infectives: PTA:   Bactrim DS 2 tabs TID (patient reports taking only once daily) Azithromycin 124m once weekly Stribild once daily  Inpatient: 4/4 >> Vanc >> 4/4 >>  Cefepime >>  4/4 >> Bactrim IV >>   Anti-infectives   Start     Dose/Rate Route Frequency Ordered Stop   10/22/2013 1045  vancomycin (VANCOCIN) 1,500 mg in sodium chloride 0.9 % 500 mL IVPB     1,500 mg 250 mL/hr over 120 Minutes Intravenous  Once 10/19/2013 1038     10/26/2013 1030  vancomycin (VANCOCIN) IVPB 1000 mg/200 mL premix  Status:  Discontinued     1,000 mg 200 mL/hr over 60 Minutes Intravenous  Once 10/19/2013 1024 10/08/2013 1035     Assessment: 89 yoM admitted 4/4 with tachycardia and SOB, concerning for ongoing/recurrent PCP pneumonia.  PMH includes recent  hospitalization (3/17- 09/20/13) with newly diagnosed HIV, started on ART (Stribild), and treatment for PCP pneumonia.  At discharge, he was taking Bactrim, but prematurely decreased his dosage.  Pharmacy is consulted to dose Vancomycin, Cefepime, and Bactrim.  Tmax: 102.8  WBCs: 5.7  Renal: SCr 1.04, CrCl ~ 87 ml/min  Goal of Therapy:  Vancomycin trough level 15-20 mcg/ml Appropriate abx dosing, eradication of infection.  Plan:   Septra 470m IV q8h  Cefepime 2g IV x1 dose, then 1g IV q8h Vancomycin 15016mIV x1 dose, then 75090mV q8h. Measure Vanc trough at steady state. Follow up renal fxn and culture results.   ChrGretta ArabarmD, BCPS Pager [phone] 11:43 AM

## 2013-10-01 NOTE — H&P (Signed)
PULMONARY / CRITICAL CARE MEDICINE   Name: William Bender MRN: [id number] DOB: [birth year]    ADMISSION DATE:  10/25/2013 CONSULTATION DATE:  10/04/2013  REFERRING MD :  [facility]our PRIMARY SERVICE: PCCM  CHIEF COMPLAINT:  Shortness of breath, fever, cough  BRIEF PATIENT DESCRIPTION: 48 y/o male recently diagnosed with HIV and treated empirically for PCP returned to the [facility] ED on 4/4 with acute hypoxemic respiratory failure.  SIGNIFICANT EVENTS / STUDIES:  4/4 CT angio > no PE, diffuse bilateral GGO and patchy consolidation worse in the bases L > R  LINES / TUBES:   CULTURES: 4/4 blood >> 4/4 urine hist >> 4/4 CMV pcr >> 4/4 PCP DFA >>  ANTIBIOTICS: 4/4 Bacrim >> 4/4 Vanc >>  4/4 Cefepime >>  HISTORY OF PRESENT ILLNESS:  48 y/o male recently diagnosed with HIV and treated empirically for PCP returned to the [facility] ED on 4/4 with acute hypoxemic respiratory failure. He was discharged from [facility] long hospital on 09/20/2013 after a new diagnosis of HIV/AIDS with acute hypoxemic respiratory failure in the setting of diffuse groundglass opacifications of his lungs. Initially on admission during the last hospitalization he required a 100% nonrebreather and was unfortunately never able to undergo a bronchoscopy to confirm a diagnosis. He was treated empirically for severe community acquired pneumonia and PCP and recovered quite nicely. It was felt that his clinical syndrome was most suggestive of PCP so he was discharged on Bactrim and prednisone.  After hospitalization he returned back to a fairly healthy state. His oxygenation had been as high as 97% on room air and he had returned somewhat normal activity. However, he started feeling increasing dry cough approximately 3 days prior to today and noted increasing shortness of breath and a fast heart rate on March 13 thousand and 15. He also noted fevers so he and his wife decided that it would be best to come to the emergency room.  Notably, on  09/28/2013 he started taking Bactrim one time a day rather than 3 times a day.  He also notes having a small ulcer on his sacrum.  PAST MEDICAL HISTORY :  Past Medical History  Diagnosis Date  . Hypertension   . Hyperlipidemia   . Chronic headaches    Past Surgical History  Procedure Laterality Date  . Bilateral shoulder surgery    . Dental surgery     Prior to Admission medications   Medication Sig Start Date End Date Taking? Authorizing Provider  acetaminophen (TYLENOL) 500 MG tablet Take 1,000 mg by mouth every 6 (six) hours as needed (Pain).   Yes Historical Provider, MD  albuterol (PROVENTIL HFA;VENTOLIN HFA) 108 (90 BASE) MCG/ACT inhaler Inhale 1 puff into the lungs every 6 (six) hours as needed for wheezing or shortness of breath.   Yes Historical Provider, MD  aspirin EC 81 MG tablet Take 81 mg by mouth daily.   Yes Historical Provider, MD  azithromycin (ZITHROMAX) 600 MG tablet Take 1,200 mg by mouth once a week. Take on Thursdays 09/27/13  Yes Ginnie Smart, MD  cloNIDine (CATAPRES) 0.1 MG tablet Take 1 tablet (0.1 mg total) by mouth 3 (three) times daily. 09/20/13  Yes Kathlen Mody, MD  Dextromethorphan-Guaifenesin (MUCINEX DM) 30-600 MG TB12 Take 1 tablet by mouth 2 (two) times daily as needed (congestion).    Yes Historical Provider, MD  diphenhydrAMINE (BENADRYL) 25 mg capsule Take 25 mg by mouth every 6 (six) hours as needed (sleep).   Yes Historical Provider, MD  elvitegravir-cobicistat-emtricitabine-tenofovir (  STRIBILD) 150-150-200-300 MG TABS tablet Take 1 tablet by mouth daily with breakfast. 09/23/13  Yes Ginnie SmartJeffrey C Hatcher, MD  omeprazole (PRILOSEC) 40 MG capsule Take 40 mg by mouth daily.    Yes Historical Provider, MD  predniSONE (DELTASONE) 20 MG tablet Prednisone 20 mg daily for 5 days followed by Prednisone 10 mg daily for 5 days. 09/27/13  Yes Ginnie SmartJeffrey C Hatcher, MD  rosuvastatin (CRESTOR) 10 MG tablet Take 10 mg by mouth daily.   Yes Historical Provider, MD   senna-docusate (SENOKOT-S) 8.6-50 MG per tablet Take 2 tablets by mouth at bedtime as needed for mild constipation. 09/20/13  Yes Kathlen ModyVijaya Akula, MD  sulfamethoxazole-trimethoprim (BACTRIM DS) 800-160 MG per tablet Take 1 tablet by mouth daily. 09/27/13  Yes Ginnie SmartJeffrey C Hatcher, MD  traMADol (ULTRAM) 50 MG tablet Take 1 tablet (50 mg total) by mouth every 6 (six) hours as needed (cough). 09/20/13  Yes Kathlen ModyVijaya Akula, MD  zolpidem (AMBIEN) 10 MG tablet Take 1 tablet (10 mg total) by mouth at bedtime as needed for sleep. 09/27/13 10/27/13 Yes Ginnie SmartJeffrey C Hatcher, MD   Allergies  Allergen Reactions  . Penicillins Rash    FAMILY HISTORY:  Family History  Problem Relation Age of Onset  . Heart disease Father   . Heart disease Maternal Grandfather   . Colon cancer Maternal Grandfather    SOCIAL HISTORY:  reports that he has never smoked. He has never used smokeless tobacco. He reports that he does not drink alcohol or use illicit drugs.  REVIEW OF SYSTEMS:   Gen: Per history of present illness HEENT: Denies blurred vision, double vision, hearing loss, tinnitus, sinus congestion, rhinorrhea, sore throat, neck stiffness, dysphagia PULM: Per history of present illness CV: Denies chest pain, edema, orthopnea, paroxysmal nocturnal dyspnea, palpitations GI: Denies abdominal pain, nausea, vomiting, diarrhea, hematochezia, melena, constipation, change in bowel habits GU: Denies dysuria, hematuria, polyuria, oliguria, urethral discharge Endocrine: Denies hot or cold intolerance, polyuria, polyphagia or appetite change Derm: Denies rash, dry skin, scaling or peeling skin change Heme: Denies easy bruising, bleeding, bleeding gums Neuro: Denies headache, numbness, weakness, slurred speech, loss of memory or consciousness  SUBJECTIVE:   VITAL SIGNS: Temp:  [99 F (37.2 C)-102.8 F (39.3 C)] 102.8 F (39.3 C) (04/04 1029) Pulse Rate:  [101-121] 114 (04/04 1200) Resp:  [19-33] 33 (04/04 1200) BP:  (114-134)/(56-69) 114/58 mmHg (04/04 1200) SpO2:  [82 %-95 %] 92 % (04/04 1200) Weight:  [70.308 kg (155 lb)] 70.308 kg (155 lb) (04/04 1038) HEMODYNAMICS:   VENTILATOR SETTINGS:   INTAKE / OUTPUT: Intake/Output   None     PHYSICAL EXAMINATION:  Gen: mildly ill appearing HEENT: NCAT, PERRL, EOMi, OP clear, neck supple without masses PULM: Crackles Left greater than right, good air movement CV: tachy, regular, no mgr, no JVD AB: BS+, soft, nontender, no hsm Ext: warm, no edema, no clubbing, no cyanosis Derm: 2 stage 2 ulcers: one on sacrum, about 3cm, one just southwest of anus ~2cm, no surrounding erythema or fluctuance Neuro: A&Ox4, CN II-XII intact, strength 5/5 in all 4 extremities   LABS:  CBC  Recent Labs Lab 09/27/13 1114 10/14/2013 1005  WBC 6.8 5.7  HGB 10.5* 10.1*  HCT 29.8* 29.3*  PLT 207 219   Coag's No results found for this basename: APTT, INR,  in the last 168 hours BMET  Recent Labs Lab 09/27/13 1114 10/23/2013 1005  NA 129* 126*  K 4.3 4.1  CL 97 93*  CO2 24 20  BUN 16  20  CREATININE 0.89 1.04  GLUCOSE 96 131*   Electrolytes  Recent Labs Lab 09/27/13 1114 10/18/2013 1005  CALCIUM 8.3* 8.2*   Sepsis Markers  Recent Labs Lab 10/03/2013 1019  LATICACIDVEN 1.56   ABG  Recent Labs Lab 10/17/2013 1031  PHART 7.431  PCO2ART 33.1*  PO2ART 92.5   Liver Enzymes  Recent Labs Lab 09/27/13 1114 10/21/2013 1005  AST 33 43*  ALT 40 47  ALKPHOS 46 53  BILITOT 0.3 0.2*  ALBUMIN 3.1* 2.5*   Cardiac Enzymes  Recent Labs Lab 09/29/2013 1005  TROPONINI <0.30  PROBNP 460.5*   Glucose No results found for this basename: GLUCAP,  in the last 168 hours  LDH 395  Imaging  CXR: diffuse hazy pulmonary opacities and consolidation bilaterally  ASSESSMENT / PLAN:  PULMONARY A: Acute hypoxemic respiratory failure : This is most likely an acute infectious process, see pneumonia below  P:   - Titrate oxygen to greater than 90% -  Prednisone 40 twice a day for presumed PCP - If requires intubation will perform bronchoscopy  INFECTIOUS A:   Acute 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> differential diagnosis includes recurrent PCP pneumonia, CMV pneumonitis, and HCAP. Less likely considerations include an atypical viral or fungal pneumonia. Because he responded to PCP treatment 2 weeks ago and he did not take the Bactrim correctly for the last 4-5 days I feel that this is most likely PCP again. We need to be mindful of the fact that he did have CMV viremia during the last hospitalization but currently he has minimal symptoms of that (no GI symptoms). 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

## 2013-10-01 NOTE — Consult Note (Signed)
[facility]    Date of Admission:  10/26/2013  Date of Consult:  10/08/2013  Reason for Consult: recurrent PCP pneumonia +/- HCAP Referring Physician: Dr. Lake Bells   HPI: William Bender is an 48 y.o. male with recently diagnosed HIV/AIDS who had been on course of oral bactrim for presumed PCP PNA (never proven as smear was negative) but then dropped dose to one tablet daily because he an his wife misunderstood that directions with re to the bactrim. He has been suffering from increasing malaise, anxiety and been tachycardic at home. His wife and he called again this am when his POX had dropped into the 39s. He presented to ED and found to be in hypoxemic respiratory failure and has been admitted to CCM  He has been started on IV bactrim higher dose steroids and vanco.cefepime for HCAP  His CXR and CT show worsening of bilateral infiltrates.    Past Medical History  Diagnosis Date  . Hypertension   . Hyperlipidemia   . Chronic headaches     Past Surgical History  Procedure Laterality Date  . Bilateral shoulder surgery    . Dental surgery    ergies:   Allergies  Allergen Reactions  . Penicillins Rash     Medications: I have reviewed patients current medications as documented in Epic Anti-infectives   Start     Dose/Rate Route Frequency Ordered Stop   10/06/13 1000  azithromycin (ZITHROMAX) tablet 1,200 mg     1,200 mg Oral Weekly 10/09/2013 1355     10/02/13 0800  elvitegravir-cobicistat-emtricitabine-tenofovir (STRIBILD) 150-150-200-300 MG tablet 1 tablet     1 tablet Oral Daily with breakfast 10/26/2013 1355     10/19/2013 2200  ceFEPIme (MAXIPIME) 1 g in dextrose 5 % 50 mL IVPB     1 g 100 mL/hr over 30 Minutes Intravenous 3 times per day 10/05/2013 1141     10/03/2013 2000  vancomycin (VANCOCIN) IVPB 750 mg/150 ml premix  Status:  Discontinued     750 mg 150 mL/hr over 60 Minutes Intravenous Every 8 hours 10/11/2013 1141 10/11/2013 1444   09/30/2013 2000   sulfamethoxazole-trimethoprim (BACTRIM) 450 mg in dextrose 5 % 500 mL IVPB     450 mg 352.1 mL/hr over 90 Minutes Intravenous Every 8 hours 10/03/2013 1141     10/11/2013 1115  ceFEPIme (MAXIPIME) 2 g in dextrose 5 % 50 mL IVPB  Status:  Discontinued     2 g 100 mL/hr over 30 Minutes Intravenous Every 12 hours 10/12/2013 1104 10/03/2013 1141   10/03/2013 1100  sulfamethoxazole-trimethoprim (BACTRIM) 450 mg in dextrose 5 % 500 mL IVPB     450 mg 352.1 mL/hr over 90 Minutes Intravenous  Once 10/16/2013 1054 09/30/2013 1257   10/09/2013 1045  vancomycin (VANCOCIN) 1,500 mg in sodium chloride 0.9 % 500 mL IVPB     1,500 mg 250 mL/hr over 120 Minutes Intravenous  Once 10/20/2013 1038 09/28/2013 1300   10/25/2013 1030  vancomycin (VANCOCIN) IVPB 1000 mg/200 mL premix  Status:  Discontinued     1,000 mg 200 mL/hr over 60 Minutes Intravenous  Once 10/18/2013 1024 09/30/2013 1035      Social History:  reports that he has never smoked. He has never used smokeless tobacco. He reports that he does not drink alcohol or use illicit drugs.  Family History  Problem Relation Age of Onset  . Heart disease Father   . Heart disease Maternal Grandfather   . Colon cancer Maternal  Grandfather     As in HPI and primary teams notes otherwise 12 point review of systems is negative, + decubitus ulcer in sacral area  Blood pressure 113/58, pulse 86, temperature 102.8 F (39.3 C), temperature source Rectal, resp. rate 26, height 5' 10"  (1.778 m), weight 155 lb (70.308 kg), SpO2 96.00%. General: Alert and awake, oriented x3, tachypnic HEENT: anicteric sclera, pupils reactive to light and accommodation, EOMI, oropharynx clear and without exudate CVS regular rate, normal r,  no murmur rubs or gallops Chest:rhonchi at bases Abdomen: soft nontender, nondistended, normal bowel sounds, Extremities: no  clubbing or edema noted bilaterally Skin: decubitus ulcer with purulenct material Neuro: nonfocal, strength and sensation  intact   Results for orders placed during the hospital encounter of 10/03/2013 (from the past 48 hour(s))  CBC WITH DIFFERENTIAL     Status: Abnormal   Collection Time    10/16/2013 10:05 AM      Result Value Ref Range   WBC 5.7  4.0 - 10.5 K/uL   RBC 3.35 (*) 4.22 - 5.81 MIL/uL   Hemoglobin 10.1 (*) 13.0 - 17.0 g/dL   HCT 29.3 (*) 39.0 - 52.0 %   MCV 87.5  78.0 - 100.0 fL   MCH 30.1  26.0 - 34.0 pg   MCHC 34.5  30.0 - 36.0 g/dL   RDW 14.6  11.5 - 15.5 %   Platelets 219  150 - 400 K/uL   Neutrophils Relative % 86 (*) 43 - 77 %   Neutro Abs 4.9  1.7 - 7.7 K/uL   Lymphocytes Relative 9 (*) 12 - 46 %   Lymphs Abs 0.5 (*) 0.7 - 4.0 K/uL   Monocytes Relative 3  3 - 12 %   Monocytes Absolute 0.2  0.1 - 1.0 K/uL   Eosinophils Relative 1  0 - 5 %   Eosinophils Absolute 0.1  0.0 - 0.7 K/uL   Basophils Relative 1  0 - 1 %   Basophils Absolute 0.1  0.0 - 0.1 K/uL  COMPREHENSIVE METABOLIC PANEL     Status: Abnormal   Collection Time    10/02/2013 10:05 AM      Result Value Ref Range   Sodium 126 (*) 137 - 147 mEq/L   Potassium 4.1  3.7 - 5.3 mEq/L   Chloride 93 (*) 96 - 112 mEq/L   CO2 20  19 - 32 mEq/L   Glucose, Bld 131 (*) 70 - 99 mg/dL   BUN 20  6 - 23 mg/dL   Creatinine, Ser 1.04  0.50 - 1.35 mg/dL   Calcium 8.2 (*) 8.4 - 10.5 mg/dL   Total Protein 6.6  6.0 - 8.3 g/dL   Albumin 2.5 (*) 3.5 - 5.2 g/dL   AST 43 (*) 0 - 37 U/L   ALT 47  0 - 53 U/L   Alkaline Phosphatase 53  39 - 117 U/L   Total Bilirubin 0.2 (*) 0.3 - 1.2 mg/dL   GFR calc non Af Amer 84 (*) >90 mL/min   GFR calc Af Amer >90  >90 mL/min   Comment: (NOTE)     The eGFR has been calculated using the CKD EPI equation.     This calculation has not been validated in all clinical situations.     eGFR's persistently <90 mL/min signify possible Chronic Kidney     Disease.  LACTATE DEHYDROGENASE     Status: Abnormal   Collection Time    10/13/2013 10:05 AM  Result Value Ref Range   LDH 395 (*) 94 - 250 U/L   Comment:  SLIGHT HEMOLYSIS     HEMOLYSIS AT THIS LEVEL MAY AFFECT RESULT  TROPONIN I     Status: None   Collection Time    10/03/2013 10:05 AM      Result Value Ref Range   Troponin I <0.30  <0.30 ng/mL   Comment:            Due to the release kinetics of cTnI,     a negative result within the first hours     of the onset of symptoms does not rule out     myocardial infarction with certainty.     If myocardial infarction is still suspected,     repeat the test at appropriate intervals.  D-DIMER, QUANTITATIVE     Status: Abnormal   Collection Time    10/25/2013 10:05 AM      Result Value Ref Range   D-Dimer, Quant 2.88 (*) 0.00 - 0.48 ug/mL-FEU   Comment:            AT THE INHOUSE ESTABLISHED CUTOFF     VALUE OF 0.48 ug/mL FEU,     THIS ASSAY HAS BEEN DOCUMENTED     IN THE LITERATURE TO HAVE     A SENSITIVITY AND NEGATIVE     PREDICTIVE VALUE OF AT LEAST     98 TO 99%.  THE TEST RESULT     SHOULD BE CORRELATED WITH     AN ASSESSMENT OF THE CLINICAL     PROBABILITY OF DVT / VTE.  PRO B NATRIURETIC PEPTIDE     Status: Abnormal   Collection Time    10/11/2013 10:05 AM      Result Value Ref Range   Pro B Natriuretic peptide (BNP) 460.5 (*) 0 - 125 pg/mL  I-STAT CG4 LACTIC ACID, ED     Status: None   Collection Time    10/27/2013 10:19 AM      Result Value Ref Range   Lactic Acid, Venous 1.56  0.5 - 2.2 mmol/L  BLOOD GAS, ARTERIAL     Status: Abnormal   Collection Time    10/15/2013 10:31 AM      Result Value Ref Range   O2 Content 3.5     Delivery systems NASAL CANNULA     pH, Arterial 7.431  7.350 - 7.450   pCO2 arterial 33.1 (*) 35.0 - 45.0 mmHg   pO2, Arterial 92.5  80.0 - 100.0 mmHg   Bicarbonate 21.0  20.0 - 24.0 mEq/L   TCO2 19.6  0 - 100 mmol/L   Acid-base deficit 1.7  0.0 - 2.0 mmol/L   O2 Saturation 94.4     Patient temperature 102.8     Collection site BRACHIAL ARTERY     Drawn by [id number]     Sample type ARTERIAL DRAW    URINALYSIS, ROUTINE W REFLEX MICROSCOPIC     Status:  None   Collection Time    10/26/2013 10:35 AM      Result Value Ref Range   Color, Urine YELLOW  YELLOW   APPearance CLEAR  CLEAR   Specific Gravity, Urine 1.007  1.005 - 1.030   pH 6.5  5.0 - 8.0   Glucose, UA NEGATIVE  NEGATIVE mg/dL   Hgb urine dipstick NEGATIVE  NEGATIVE   Bilirubin Urine NEGATIVE  NEGATIVE   Ketones, ur NEGATIVE  NEGATIVE mg/dL   Protein, ur NEGATIVE  NEGATIVE mg/dL   Urobilinogen, UA 0.2  0.0 - 1.0 mg/dL   Nitrite NEGATIVE  NEGATIVE   Leukocytes, UA NEGATIVE  NEGATIVE   Comment: MICROSCOPIC NOT DONE ON URINES WITH NEGATIVE PROTEIN, BLOOD, LEUKOCYTES, NITRITE, OR GLUCOSE <1000 mg/dL.      Component Value Date/Time   SDES NASOPHARYNGEAL 09/13/2013 2017   [place] NONE 09/13/2013 2017   CULT  Value: NOMRSA Performed at [facility] 09/13/2013 2017   REPTSTATUS 09/16/2013 FINAL 09/13/2013 2017   Ct Angio Chest Pe W/cm &/or Wo Cm  10/17/2013   CLINICAL DATA:  Tachycardia and fever  EXAM: CT ANGIOGRAPHY CHEST WITH CONTRAST  TECHNIQUE: Multidetector CT imaging of the chest was performed using the standard protocol during bolus administration of intravenous contrast. Multiplanar CT image reconstructions and MIPs were obtained to evaluate the vascular anatomy.  CONTRAST:  119m OMNIPAQUE IOHEXOL 350 MG/ML SOLN  COMPARISON:  09/02/2013  FINDINGS: The lungs are well aerated bilaterally and demonstrate diffuse infiltrate throughout the left lung as well as patchy infiltrative changes in the right lung worst in the right middle lobe. These changes are similar to that seen on the recent chest x-ray. The thoracic inlet is within normal limits. 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 The thoracic aorta is otherwise within normal limits. The pulmonary artery is well visualized and shows a normal branching pattern. No intraluminal filling defect to suggest pulmonary embolism is identified. No significant hilar adenopathy is seen. A few scattered mediastinal lymph  nodes are noted. The largest of these lies in the aortic or pulmonary window and measures 9 mm in short axis. This is increased in size from the prior exam.  Scanning into the upper abdomen reveals no acute abnormality. No gross bony abnormality is seen.  Review of the MIP images confirms the above findings.  IMPRESSION: No evidence of pulmonary embolism.  Diffuse bilateral infiltrates similar to that seen on recent chest x-ray but new from the prior CT examination. Given the patient's underlying clinical history this may represent a opportunistic infection. Further workup is recommended.  Reactive aorticopulmonary window node on the left.  Stable aberrant right subclavian artery.   Electronically Signed   By: MInez CatalinaM.D.   On: 10/08/2013 11:43   Dg Chest Portable 1 View  10/05/2013   CLINICAL DATA:  TACHYCARDIA SHORTNESS OF BREATH TACHYCARDIA SHORTNESS OF BREATH  EXAM: PORTABLE CHEST - 1 VIEW  COMPARISON:  DG CHEST 2 VIEW dated 09/19/2013  FINDINGS: Low lung volumes. Cardiac silhouette is within normal limits. Diffuse pulmonary opacities are appreciated within the left hemi thorax and to a lesser extent right lung base. Osteoarthritic changes are appreciated within the shoulders.  IMPRESSION: Increase conspicuity of the diffuse infiltrates left hemi thorax with interval development of a right lower lobe infiltrate.   Electronically Signed   By: HMargaree MackintoshM.D.   On: 10/08/2013 10:56     Recent Results (from the past 720 hour(s))  CULTURE, BLOOD (ROUTINE X 2)     Status: None   Collection Time    09/13/13 11:05 AM      Result Value Ref Range Status   Specimen Description BLOOD LEFT ARM   Final   Special Requests BOTTLES DRAWN AEROBIC AND ANAEROBIC [facility]   Final   Culture  Setup Time     Final   Value: 09/13/2013 14:03     [facility]formed at [facility]  Culture     Final   Value: NO GROWTH  5 DAYS     Performed at [facility]   Report Status 09/19/2013 FINAL   Final   CULTURE, BLOOD (ROUTINE X 2)     Status: None   Collection Time    09/13/13 11:15 AM      Result Value Ref Range Status   Specimen Description BLOOD RIGHT ARM   Final   Special Requests BOTTLES DRAWN AEROBIC AND ANAEROBIC 10CC EACH   Final   Culture  Setup Time     Final   Value: 09/13/2013 14:03     Performed at [facility]   Culture     Final   Value: NO GROWTH 5 DAYS     Performed at [facility]   Report Status 09/19/2013 FINAL   Final  MRSA PCR SCREENING     Status: Abnormal   Collection Time    09/13/13  8:17 PM      Result Value Ref Range Status   MRSA by PCR INVALID RESULTS, SPECIMEN SENT FOR CULTURE (*) NEGATIVE Corrected   Comment: RESULT CALLED TO, READ BACK BY AND VERIFIED WITH:     SPOKE WITH BANKS,T RN [phone] COVINGTON,N     CORRECTED ON 03/18 AT 1155: PREVIOUSLY REPORTED AS RESULT CALLED TO, READ BACK BY AND VERIFIED WITH: SPOKE WITH BANKS,T RN [phone] COVINGTON,N        The GeneXpert MRSA Assay (FDA approved for NASAL specimens only), is one component of a      comprehensive MRSA colonization surveillance program. It is not intended to diagnose MRSA infection nor to guide or monitor treatment for MRSA infections.  MRSA CULTURE     Status: None   Collection Time    09/13/13  8:17 PM      Result Value Ref Range Status   Specimen Description NASOPHARYNGEAL   Final   Special Requests NONE   Final   Culture     Final   Value: NOMRSA     Performed at [facility]   Report Status 09/16/2013 FINAL   Final  RESPIRATORY VIRUS PANEL     Status: None   Collection Time    09/14/13 12:20 PM      Result Value Ref Range Status   Source - RVPAN NASAL SWAB   Corrected   Comment: CORRECTED ON 03/19 AT 1848: PREVIOUSLY REPORTED AS NASAL SWAB   Respiratory Syncytial Virus A NOT DETECTED   Final   Respiratory Syncytial Virus B NOT DETECTED   Final   Influenza A NOT DETECTED   Final   Influenza B NOT DETECTED   Final   Parainfluenza 1 NOT  DETECTED   Final   Parainfluenza 2 NOT DETECTED   Final   Parainfluenza 3 NOT DETECTED   Final   Metapneumovirus NOT DETECTED   Final   Rhinovirus NOT DETECTED   Final   Adenovirus NOT DETECTED   Final   Influenza A H1 NOT DETECTED   Final   Influenza A H3 NOT DETECTED   Final   Comment: (NOTE)           Normal Reference Range for each Analyte: NOT DETECTED     Testing performed using the Luminex xTAG Respiratory Viral Panel test     kit.     This test was developed and its performance characteristics determined     by [facility]. It has not been cleared or approved by the [place]     Food and Drug  Administration. This test is used for clinical purposes.     It should not be regarded as investigational or for research. This     laboratory is certified under the [place] (CLIA) as qualified to perform high complexity     clinical laboratory testing.     Performed at [place]     Status: None   Collection Time    09/14/13  9:15 PM      Result Value Ref Range Status   CT Probe RNA NEGATIVE  NEGATIVE Final   GC Probe RNA NEGATIVE  NEGATIVE Final   Comment: (NOTE)                                                                                               **Normal Reference Range: Negative**          Assay performed using the Gen-Probe APTIMA COMBO2 (R) Assay.     Acceptable specimen types for this assay include APTIMA Swabs (Unisex,     endocervical, urethral, or vaginal), first void urine, and ThinPrep     liquid based cytology samples.     Performed at [facility]     Status: None   Collection Time    09/14/13 10:15 PM      Result Value Ref Range Status   Specimen Source-PJSRC SPUTUM   Final   Pneumocystis jiroveci Ag NEGATIVE   Final   Comment: Performed at [facility]     Impression/Recommendation  Principal Problem:    Pneumonia Active Problems:   Acute respiratory failure   AIDS   PCP (pneumocystis carinii pneumonia)   CMV (cytomegalovirus)   Sacral decubitus ulcer, stage II   Hyponatremia   William Bender is a 48 y.o. male with  HIV/AIDS, PCP PNA recurrence vs HCAP  #1 PCP +/ HCAP:  --agree with high dose bactrim and steroids  --continue cefepime but can drop IV vancomycin since the TMP/SMX will cover MRSA as well  --if worsens at all go to higher dose IV steroids I spent greater than 25 minutes with the patient including greater than 50% of time in face to face counsel of the patient and in coordination of their care.  #2 HIV/AIDS:  --continue STRIBILD --continue M avium prophylaxis  #3 Decubitus ulcer:   Broadly covered with abx  Would consult wound care  # 4 Insomnia and anxiety: defer to priamry team but would consider benzodiazepene. Not his DAUGHTER does NOt know 042 diagnosis so discretion clearly paramount   10/15/2013, 3:25 PM   Thank you so much for this interesting consult  [place] for [place] [phone] (pager) [phone] (office) 10/18/2013, 3:25 PM  Rhina Brackett [place] 10/06/2013, 3:25 PM

## 2013-10-01 NOTE — Progress Notes (Signed)
eLink Physician-Brief Progress Note Patient Name: William Bender DOB: 11/12/1965 MRN: [id number]  Date of Service  10/06/2013   HPI/Events of Note  Patient with HIV and presumed PCP vs HCAP.  Worsening hypoxia and patient anxious.  Patient now on NRB mask up from 6 liter [place].   eICU Interventions  abg and cxr now   Intervention Category Major Interventions: Hypoxemia - evaluation and management  Henry RusselSMITH, [name], P 10/24/2013, 8:25 PM

## 2013-10-01 NOTE — ED Notes (Signed)
Pt was in hospital for pna.  Was on a 21 day course of bactrim (2 pills, 3 times a day).  Was changed to once a day when he got it refills.  02 levels have been in the mid 80s and HR has been 130-180 at home.

## 2013-10-01 NOTE — ED Notes (Signed)
Report given to Mary, rn

## 2013-10-02 ENCOUNTER — Inpatient Hospital Stay (HOSPITAL_COMMUNITY): Payer: BC Managed Care – PPO

## 2013-10-02 LAB — BLOOD GAS, ARTERIAL
ACID-BASE DEFICIT: 1.2 mmol/L (ref 0.0–2.0)
Bicarbonate: 23.1 mEq/L (ref 20.0–24.0)
Drawn by: 257701
FIO2: 1 %
O2 Saturation: 98.7 %
PEEP: 10 cmH2O
Patient temperature: 100
RATE: 22 resp/min
TCO2: 21.6 mmol/L (ref 0–100)
VT: 440 mL
pCO2 arterial: 40.5 mmHg (ref 35.0–45.0)
pH, Arterial: 7.378 (ref 7.350–7.450)
pO2, Arterial: 187 mmHg — ABNORMAL HIGH (ref 80.0–100.0)

## 2013-10-02 LAB — BASIC METABOLIC PANEL
BUN: 12 mg/dL (ref 6–23)
CO2: 24 meq/L (ref 19–32)
Calcium: 8 mg/dL — ABNORMAL LOW (ref 8.4–10.5)
Chloride: 102 mEq/L (ref 96–112)
Creatinine, Ser: 0.94 mg/dL (ref 0.50–1.35)
GFR calc Af Amer: >90 mL/min (ref >90)
GFR calc non Af Amer: >90 mL/min (ref >90)
Glucose, Bld: 134 mg/dL — ABNORMAL HIGH (ref 70–99)
Potassium: 4.4 mEq/L (ref 3.7–5.3)
SODIUM: 134 meq/L — AB (ref 137–147)

## 2013-10-02 LAB — GLUCOSE, CAPILLARY
GLUCOSE-CAPILLARY: 152 mg/dL — AB (ref 70–99)
Glucose-Capillary: 158 mg/dL — ABNORMAL HIGH (ref 70–99)
Glucose-Capillary: 112 mg/dL — ABNORMAL HIGH (ref 70–99)

## 2013-10-02 LAB — CBC
HCT: 25.9 % — ABNORMAL LOW (ref 39.0–52.0)
Hemoglobin: 8.9 g/dL — ABNORMAL LOW (ref 13.0–17.0)
MCH: 30.1 pg (ref 26.0–34.0)
MCHC: 34.4 g/dL (ref 30.0–36.0)
MCV: 87.5 fL (ref 78.0–100.0)
Platelets: 178 10*3/uL (ref 150–400)
RBC: 2.96 MIL/uL — AB (ref 4.22–5.81)
RDW: 14.9 % (ref 11.5–15.5)
WBC: 4.7 10*3/uL (ref 4.0–10.5)

## 2013-10-02 MED ORDER — FENTANYL CITRATE 0.05 MG/ML IJ SOLN
100.0000 ug | Freq: Once | INTRAMUSCULAR | Status: AC
  Administered 2013-10-02: 50 ug via INTRAVENOUS
  Filled 2013-10-02: qty 2

## 2013-10-02 MED ORDER — ETOMIDATE 2 MG/ML IV SOLN
INTRAVENOUS | Status: AC
  Filled 2013-10-02: qty 20

## 2013-10-02 MED ORDER — LORAZEPAM 2 MG/ML IJ SOLN
1.0000 mg | Freq: Once | INTRAMUSCULAR | Status: AC
  Administered 2013-10-02: 1 mg via INTRAVENOUS

## 2013-10-02 MED ORDER — LORAZEPAM 2 MG/ML IJ SOLN
INTRAMUSCULAR | Status: AC
  Administered 2013-10-02: 1 mg
  Filled 2013-10-02: qty 1

## 2013-10-02 MED ORDER — MIDAZOLAM HCL 2 MG/2ML IJ SOLN
1.0000 mg | INTRAMUSCULAR | Status: DC | PRN
  Administered 2013-10-02 – 2013-10-05 (×5): 2 mg via INTRAVENOUS
  Filled 2013-10-02 (×5): qty 2

## 2013-10-02 MED ORDER — MIDAZOLAM HCL 2 MG/2ML IJ SOLN
4.0000 mg | Freq: Once | INTRAMUSCULAR | Status: AC
  Administered 2013-10-02: 4 mg via INTRAVENOUS

## 2013-10-02 MED ORDER — FENTANYL CITRATE 0.05 MG/ML IJ SOLN
INTRAMUSCULAR | Status: AC
  Administered 2013-10-02: 100 ug
  Filled 2013-10-02: qty 2

## 2013-10-02 MED ORDER — CHLORHEXIDINE GLUCONATE 0.12 % MT SOLN
15.0000 mL | Freq: Two times a day (BID) | OROMUCOSAL | Status: DC
  Administered 2013-10-02 – 2013-10-04 (×5): 15 mL via OROMUCOSAL
  Filled 2013-10-02 (×3): qty 15

## 2013-10-02 MED ORDER — MIDAZOLAM HCL 2 MG/2ML IJ SOLN
INTRAMUSCULAR | Status: AC
  Administered 2013-10-02: 2 mg
  Filled 2013-10-02: qty 2

## 2013-10-02 MED ORDER — ROCURONIUM BROMIDE 50 MG/5ML IV SOLN
INTRAVENOUS | Status: AC
  Filled 2013-10-02: qty 2

## 2013-10-02 MED ORDER — FENTANYL CITRATE 0.05 MG/ML IJ SOLN
50.0000 ug | Freq: Once | INTRAMUSCULAR | Status: AC
Start: 2013-10-02 — End: 2013-10-02
  Administered 2013-10-02: 50 ug via INTRAVENOUS

## 2013-10-02 MED ORDER — VITAL HIGH PROTEIN PO LIQD
1000.0000 mL | ORAL | Status: DC
  Administered 2013-10-02: 1000 mL
  Filled 2013-10-02: qty 1000

## 2013-10-02 MED ORDER — ETOMIDATE 2 MG/ML IV SOLN
20.0000 mg | Freq: Once | INTRAVENOUS | Status: AC
  Administered 2013-10-02: 20 mg via INTRAVENOUS
  Filled 2013-10-02: qty 10

## 2013-10-02 MED ORDER — LIDOCAINE HCL (CARDIAC) 20 MG/ML IV SOLN
INTRAVENOUS | Status: AC
  Administered 2013-10-02: 3 mL
  Filled 2013-10-02: qty 5

## 2013-10-02 MED ORDER — ROCURONIUM BROMIDE 50 MG/5ML IV SOLN
70.0000 mg | Freq: Once | INTRAVENOUS | Status: AC
  Administered 2013-10-02: 70 mg via INTRAVENOUS
  Filled 2013-10-02: qty 7

## 2013-10-02 MED ORDER — DOLUTEGRAVIR SODIUM 50 MG PO TABS
50.0000 mg | ORAL_TABLET | Freq: Every day | ORAL | Status: DC
  Administered 2013-10-03 – 2013-11-05 (×34): 50 mg via ORAL
  Filled 2013-10-02 (×36): qty 1

## 2013-10-02 MED ORDER — BIOTENE DRY MOUTH MT LIQD
15.0000 mL | Freq: Four times a day (QID) | OROMUCOSAL | Status: DC
  Administered 2013-10-02 – 2013-10-04 (×7): 15 mL via OROMUCOSAL

## 2013-10-02 MED ORDER — METHYLPREDNISOLONE SODIUM SUCC 125 MG IJ SOLR
125.0000 mg | Freq: Two times a day (BID) | INTRAMUSCULAR | Status: DC
  Administered 2013-10-02 – 2013-10-04 (×5): 125 mg via INTRAVENOUS
  Filled 2013-10-02 (×6): qty 2

## 2013-10-02 MED ORDER — PANTOPRAZOLE SODIUM 40 MG PO PACK
40.0000 mg | PACK | Freq: Every day | ORAL | Status: DC
  Administered 2013-10-02 – 2013-11-05 (×35): 40 mg
  Filled 2013-10-02 (×41): qty 20

## 2013-10-02 MED ORDER — FENTANYL CITRATE 0.05 MG/ML IJ SOLN
INTRAMUSCULAR | Status: AC
  Filled 2013-10-02: qty 2

## 2013-10-02 MED ORDER — MIDAZOLAM HCL 2 MG/2ML IJ SOLN
INTRAMUSCULAR | Status: AC
  Filled 2013-10-02: qty 4

## 2013-10-02 MED ORDER — VITAL AF 1.2 CAL PO LIQD
1000.0000 mL | ORAL | Status: DC
  Administered 2013-10-02: 1000 mL
  Administered 2013-10-03: 70 mL
  Filled 2013-10-02 (×6): qty 1000

## 2013-10-02 MED ORDER — SUCCINYLCHOLINE CHLORIDE 20 MG/ML IJ SOLN
INTRAMUSCULAR | Status: AC
  Filled 2013-10-02: qty 1

## 2013-10-02 MED ORDER — ASPIRIN 81 MG PO CHEW
81.0000 mg | CHEWABLE_TABLET | Freq: Every day | ORAL | Status: DC
  Administered 2013-10-02 – 2013-10-16 (×15): 81 mg via ORAL
  Filled 2013-10-02 (×15): qty 1

## 2013-10-02 MED ORDER — EMTRICITABINE-TENOFOVIR DF 200-300 MG PO TABS
1.0000 | ORAL_TABLET | Freq: Every day | ORAL | Status: DC
  Administered 2013-10-03 – 2013-10-24 (×22): 1 via ORAL
  Filled 2013-10-02 (×23): qty 1

## 2013-10-02 MED ORDER — SODIUM CHLORIDE 0.9 % IV SOLN
0.0000 ug/h | INTRAVENOUS | Status: DC
  Administered 2013-10-02: 50 ug/h via INTRAVENOUS
  Administered 2013-10-02 – 2013-10-04 (×3): 300 ug/h via INTRAVENOUS
  Administered 2013-10-04: 275 ug/h via INTRAVENOUS
  Administered 2013-10-05 – 2013-10-06 (×3): 300 ug/h via INTRAVENOUS
  Administered 2013-10-06: 400 ug/h via INTRAVENOUS
  Administered 2013-10-06 – 2013-10-09 (×7): 300 ug/h via INTRAVENOUS
  Administered 2013-10-09: 350 ug/h via INTRAVENOUS
  Administered 2013-10-09: 300 ug/h via INTRAVENOUS
  Administered 2013-10-10 (×2): 400 ug/h via INTRAVENOUS
  Administered 2013-10-10: 350 ug/h via INTRAVENOUS
  Administered 2013-10-10 – 2013-10-11 (×2): 400 ug/h via INTRAVENOUS
  Filled 2013-10-02 (×27): qty 50

## 2013-10-02 MED ORDER — FENTANYL BOLUS VIA INFUSION
50.0000 ug | INTRAVENOUS | Status: DC | PRN
  Administered 2013-10-02: 50 ug via INTRAVENOUS
  Filled 2013-10-02: qty 100

## 2013-10-02 NOTE — Progress Notes (Signed)
INITIAL NUTRITION ASSESSMENT  DOCUMENTATION CODES Per approved criteria  -Not Applicable   INTERVENTION: Initiate Vital AF 1.2 @ 20 ml/hr via OG tube and increase by 10 ml every 4 hours to goal rate of 80 ml/hr. At goal rate, tube feeding regimen will provide 2304 kcal, 103 grams of protein, and 1549 ml of H2O. Goal rate meets 100% of estimated calorie and protein needs.  RD to continue to follow for Nutrition Care Plan.  NUTRITION DIAGNOSIS: Inadequate oral intake related to inability to eat as evidenced by NPO.   Goal: TF regimen to meet >/= 90% of their estimated nutrition needs   Monitor:  TF initiation, wt trends, labs, respiratory status  Reason for Assessment: TF consult  48 y.o. male  Admitting Dx: Pneumonia  ASSESSMENT: 48 y/o male recently diagnosed with HIV and treated empirically for PCP [facility] with acute hypoxemic respiratory failure.  Pt intubated 4/5. Per family, pt has lost ~15-20 lbs over the past few weeks. He is using nutritional supplements at home (Boost or Ensure.)   Patient has OG tube in place with tip of tube in stomach. Vital HP is infusing @ 20 ml/hr. Tube feeding regimen currently providing 480 kcal, 25 grams protein, and 401 ml H2O.   Height: Ht Readings from Last 1 Encounters:  2013/09/06 5\' 10"  (1.778 m)    Weight: Wt Readings from Last 1 Encounters:  10/02/13 167 lb 5.3 oz (75.9 kg)    Ideal Body Weight: 73 kg  % Ideal Body Weight: 104%  Wt Readings from Last 10 Encounters:  10/02/13 167 lb 5.3 oz (75.9 kg)  09/27/13 162 lb (73.483 kg)  09/20/13 169 lb 15.6 oz (77.1 kg)  09/13/13 179 lb 3.2 oz (81.285 kg)    Usual Body Weight: 175 lbs  % Usual Body Weight: 95%  BMI:  Body mass index is 24.01 kg/(m^2).  Estimated Nutritional Needs: Kcal: 2311 Protein: 90-105 g Fluid: ~2.3 L/day Based on Tmax:39.1C and Minute Ventilation: 13.5 L  Skin: stage II wound on sacrum  Diet Order: NPO  EDUCATION  NEEDS: -Education not appropriate at this time   Intake/Output Summary (Last 24 hours) at 10/02/13 1256 Last data filed at 10/02/13 1000  Gross per 24 hour  Intake   3330 ml  Output   4900 ml  Net  -1570 ml    Last BM: PTA   Labs:   Recent Labs Lab 09/27/13 1114 2013/09/06 1005 10/02/13 0326  NA 129* 126* 134*  K 4.3 4.1 4.4  CL 97 93* 102  CO2 24 20 24   BUN 16 20 12   CREATININE 0.89 1.04 0.94  CALCIUM 8.3* 8.2* 8.0*  GLUCOSE 96 131* 134*    CBG (last 3)  No results found for this basename: GLUCAP,  in the last 72 hours  Scheduled Meds: . antiseptic oral rinse  15 mL Mouth Rinse QID  . aspirin  81 mg Oral Daily  . [START ON 10/06/2013] azithromycin  1,200 mg Oral Weekly  . ceFEPime (MAXIPIME) IV  1 g Intravenous 3 times per day  . chlorhexidine  15 mL Mouth Rinse BID  . cloNIDine  0.1 mg Oral TID  . elvitegravir-cobicistat-emtricitabine-tenofovir  1 tablet Oral Q breakfast  . enoxaparin (LOVENOX) injection  40 mg Subcutaneous Q24H  . feeding supplement (VITAL HIGH PROTEIN)  1,000 mL Per Tube Q24H  . pantoprazole sodium  40 mg Per Tube Daily  . predniSONE  40 mg Oral BID WC  .  rosuvastatin  10 mg Oral q1800  . sulfamethoxazole-trimethoprim  450 mg Intravenous Q8H    Continuous Infusions: . fentaNYL infusion INTRAVENOUS 200 mcg/hr (10/02/13 1202)    Past Medical History  Diagnosis Date  . Hypertension   . Hyperlipidemia   . Chronic headaches     Past Surgical History  Procedure Laterality Date  . Bilateral shoulder surgery    . Dental surgery      Ebbie Latus RD, LDN

## 2013-10-02 NOTE — Procedures (Signed)
PCCM Bronchoscopy Procedure Note  The patient was informed of the risks (including but not limited to bleeding, infection, respiratory failure, lung injury, tooth/oral injury) and benefits of the procedure and gave consent, see chart.  Indication: Respiratory failure, need BAL specimen for culture  [facility]  Condition pre procedure: Critically ill on vent  Medications for procedure: fentanyl gtt, versed push (4mg ), lidocaine topically 1%, 3cc  Procedure description: The bronchoscope was introduced through the endotracheal tube and passed to the bilateral lungs to the level of the subsegmental bronchi throughout the tracheobronchial tree.  Airway exam revealed thin, cloudy secretions throughout the tracheobronchial tree bilaterally.  The airway mucosa was normal in appearance.  No airway lesions or masses seen.  Procedures performed: BAL lingula  Specimens sent:  BAL: 1) PCP DFA 2) bacterial culture 3) Fungal culture 4) AFB culture 5) CMV culture  Condition post procedure: critically ill, on vent  Complications: none  EBL: < 5cc  Post operative diagnosis: respiratory failure due to pneumonia  Yolonda KidaMCQUAID, Antwone Capozzoli Liborio Negron Torres PCCM Pager: [phone] Cell: [phone] If no response, call [phone]

## 2013-10-02 NOTE — Procedures (Signed)
Central Venous Catheter Insertion Procedure Note Tennis MustBrent Badour [id number] 06/26/1966  Procedure: Insertion of Central Venous Catheter Indications: Assessment of intravascular volume and Drug and/or fluid administration  Procedure Details Consent: Risks of procedure as well as the alternatives and risks of each were explained to the (patient/caregiver).  Consent for procedure obtained. Time Out: Verified patient identification, verified procedure, site/side was marked, verified correct patient position, special equipment/implants available, medications/allergies/relevent history reviewed, required imaging and test results available.  Performed  Maximum sterile technique was used including antiseptics, cap, gloves, gown, hand hygiene, mask and sheet. Skin prep: Chlorhexidine; local anesthetic administered A antimicrobial bonded/coated triple lumen catheter was placed in the right internal jugular vein using the Seldinger technique.  Ultrasound was used to verify the patency of the vein and for real time needle guidance.  Of note: I initially attempted to place the catheter in a very small and patent left IJ viewed under ultrasound.  However, the wire would not pass.    Evaluation Blood flow good Complications: No apparent complications Patient did tolerate procedure well. Chest X-ray ordered to verify placement.  CXR: pending.  Salia Cangemi 10/02/2013, 9:29 AM

## 2013-10-02 NOTE — Progress Notes (Signed)
PULMONARY / CRITICAL CARE MEDICINE   Name: William Bender MRN: [id number] DOB: [birth year]    ADMISSION DATE:  10-11-13 CONSULTATION DATE:  2013-10-11  REFERRING MD :  Rancour PRIMARY SERVICE: PCCM  CHIEF COMPLAINT:  Shortness of breath, fever, cough  BRIEF PATIENT DESCRIPTION: 48 y/o male recently diagnosed with HIV and treated empirically for PCP returned to the [facility] ED on 4/4 with acute hypoxemic respiratory failure.  SIGNIFICANT EVENTS / STUDIES:  4/4 CT angio > no PE, diffuse bilateral GGO and patchy consolidation worse in the bases L > R  LINES / TUBES: 4/5 ETT >> 4/5 R IJ CVL >>  CULTURES: 4/4 blood >> 4/4 urine hist >> 4/4 CMV pcr >> 4/4 PCP DFA induced sputum >> 4/5 PCP DFA BAL LUL >> 4/5 resp culture bal >> 4/5 fungal culture bal >> 4/5 afb culture bal >> 4/5 cmv bal >>  ANTIBIOTICS: 4/4 Bacrim >> 4/4 Vanc >>  4/4 Cefepime >>  SUBJECTIVE:  More short of breath this morning, oxygen requirement going up, febrile, chills, anxious  VITAL SIGNS: Temp:  [97.6 F (36.4 C)-102.8 F (39.3 C)] 102.4 F (39.1 C) (04/05 0800) Pulse Rate:  [60-121] 76 (04/05 0600) Resp:  [19-51] 26 (04/05 0600) BP: (108-164)/(51-81) 145/62 mmHg (04/05 0600) SpO2:  [82 %-99 %] 93 % (04/05 0600) FiO2 (%):  [55 %-100 %] 100 % (04/05 0901) Weight:  [70.308 kg (155 lb)-77.2 kg (170 lb 3.1 oz)] 75.9 kg (167 lb 5.3 oz) (04/05 0400) HEMODYNAMICS:   VENTILATOR SETTINGS: Vent Mode:  [-] PRVC FiO2 (%):  [55 %-100 %] 100 % Set Rate:  [22 bmp] 22 bmp Vt Set:  [440 mL] 440 mL PEEP:  [10 cmH20] 10 cmH20 Plateau Pressure:  [21 cmH20] 21 cmH20 INTAKE / OUTPUT: Intake/Output     04/04 0701 - 04/05 0700 04/05 0701 - 04/06 0700   I.V. (mL/kg) 4280 (56.4)    IV Piggyback 1050    Total Intake(mL/kg) 5330 (70.2)    Urine (mL/kg/hr) 3700    Total Output 3700     Net +1630            PHYSICAL EXAMINATION:  Gen: marked respiratory distress HEENT: NCAT,  EOMi, OP clear PULM: Crackles Left  greater than right, good air movement CV: tachy, regular, no mgr, no JVD AB: BS+, soft, nontender, no hsm Ext: warm, no edema, no clubbing, no cyanosis Derm: diaphoretic, flushed   LABS:  CBC  Recent Labs Lab 09/27/13 1114 11-Oct-2013 1005 10/02/13 0326  WBC 6.8 5.7 4.7  HGB 10.5* 10.1* 8.9*  HCT 29.8* 29.3* 25.9*  PLT 207 219 178   Coag's No results found for this basename: APTT, INR,  in the last 168 hours BMET  Recent Labs Lab 09/27/13 1114 10/11/2013 1005 10/02/13 0326  NA 129* 126* 134*  K 4.3 4.1 4.4  CL 97 93* 102  CO2 24 20 24   BUN 16 20 12   CREATININE 0.89 1.04 0.94  GLUCOSE 96 131* 134*   Electrolytes  Recent Labs Lab 09/27/13 1114 October 11, 2013 1005 10/02/13 0326  CALCIUM 8.3* 8.2* 8.0*   Sepsis Markers  Recent Labs Lab Oct 11, 2013 1019  LATICACIDVEN 1.56   ABG  Recent Labs Lab Oct 11, 2013 1031 10-11-2013 2040  PHART 7.431 7.437  PCO2ART 33.1* 31.1*  PO2ART 92.5 121.0*   Liver Enzymes  Recent Labs Lab 09/27/13 1114 10-11-13 1005  AST 33 43*  ALT 40 47  ALKPHOS 46 53  BILITOT 0.3 0.2*  ALBUMIN 3.1* 2.5*   Cardiac  Enzymes  Recent Labs Lab 10/27/2013 1005  TROPONINI <0.30  PROBNP 460.5*   Glucose No results found for this basename: GLUCAP,  in the last 168 hours  LDH 395  Imaging  4/4 CXR: diffuse hazy pulmonary opacities and consolidation bilaterally  ASSESSMENT / PLAN:  PULMONARY A: Acute hypoxemic respiratory failure : This is most likely an acute infectious process, see pneumonia below  P:   - Intubate now - bronchoscopy post bronch - start with PEEP 10cm, TVol 6cc/kg - abg one hour post vent - may need ARDS protocol  INFECTIOUS A:   Acute 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> ddx broad: PCP, HCAP, fungal, less likely AFB or viral (CMV)  Stage II buttock ulcers P:   - Treat HCAP and PCP with Bactrim and broad-spectrum antibiotics - bronch for bal cultures today - Continue antiviral therapy for HIV - Continue MAC  prophylaxis - wound care - appreciate ID  CARDIOVASCULAR A: Sepsis without shock Baseline hypertension P:  - Continue IV fluids - Continue home clonidine  RENAL A:  Hyponatremia> 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), improving P:   - Monitor sodium  GASTROINTESTINAL A:  No acute issues P:   - Continue home pantoprazole via tube - start tube feedings  HEMATOLOGIC A:  No acute issues P:  - Monitor CBC  ENDOCRINE A:   No acute issues P:   - monitor glucose on prednisone and tube feeds  NEUROLOGIC A:   no acute issues  P:   - Monitor neurologic status  Family : Wife and father updated via phone and at bedside respectively  CODE STATUS: Full   CC time outside of procedures 90 minutes  Yolonda KidaMCQUAID, Abdur [facility] PCCM Pager: [phone] Cell: [phone] If no response, call [phone]  10/02/2013, 9:30 AM

## 2013-10-02 NOTE — Procedures (Signed)
Intubation Procedure Note Tennis MustBrent Godown [id number] 04/13/1966  Procedure: Intubation Indications: Respiratory insufficiency  Procedure Details Consent: Risks of procedure as well as the alternatives and risks of each were explained to the (patient/caregiver).  Consent for procedure obtained. Time Out: Verified patient identification, verified procedure, site/side was marked, verified correct patient position, special equipment/implants available, medications/allergies/relevent history reviewed, required imaging and test results available.  Performed  Maximum sterile technique was used including gloves, hand hygiene and mask.  MAC and 3    Evaluation Hemodynamic Status: BP stable throughout; O2 sats: transiently fell during during procedure Patient's Current Condition: stable Complications: No apparent complications Patient did tolerate procedure well. Chest X-ray ordered to verify placement.  CXR: pending.   Dairl PonderWalters, Jenin Birdsall Nannette 10/02/2013

## 2013-10-02 NOTE — Progress Notes (Addendum)
[facility]    Subjective: No new complaints   Antibiotics:  Anti-infectives   Start     Dose/Rate Route Frequency Ordered Stop   10/06/13 1000  azithromycin (ZITHROMAX) tablet 1,200 mg     1,200 mg Oral Weekly 10/19/2013 1355     10/02/13 1600  dolutegravir (TIVICAY) tablet 50 mg    Comments:  GIVE DOWN THE NG TUBE   50 mg Oral Daily 10/02/13 1554     10/02/13 1600  emtricitabine-tenofovir (TRUVADA) 200-300 MG per tablet 1 tablet     1 tablet Oral Daily 10/02/13 1554     10/02/13 0800  elvitegravir-cobicistat-emtricitabine-tenofovir (STRIBILD) 150-150-200-300 MG tablet 1 tablet  Status:  Discontinued     1 tablet Oral Daily with breakfast 10/09/2013 1355 10/02/13 1554   10/24/2013 2200  ceFEPIme (MAXIPIME) 1 g in dextrose 5 % 50 mL IVPB     1 g 100 mL/hr over 30 Minutes Intravenous 3 times per day 10/26/2013 1141     09/30/2013 2000  vancomycin (VANCOCIN) IVPB 750 mg/150 ml premix  Status:  Discontinued     750 mg 150 mL/hr over 60 Minutes Intravenous Every 8 hours 10/15/2013 1141 10/08/2013 1444   10/24/2013 2000  sulfamethoxazole-trimethoprim (BACTRIM) 450 mg in dextrose 5 % 500 mL IVPB     450 mg 352.1 mL/hr over 90 Minutes Intravenous Every 8 hours 10/18/2013 1141     10/17/2013 1115  ceFEPIme (MAXIPIME) 2 g in dextrose 5 % 50 mL IVPB  Status:  Discontinued     2 g 100 mL/hr over 30 Minutes Intravenous Every 12 hours 10/10/2013 1104 10/06/2013 1141   09/28/2013 1100  sulfamethoxazole-trimethoprim (BACTRIM) 450 mg in dextrose 5 % 500 mL IVPB     450 mg 352.1 mL/hr over 90 Minutes Intravenous  Once 09/29/2013 1054 09/28/2013 1257   10/23/2013 1045  vancomycin (VANCOCIN) 1,500 mg in sodium chloride 0.9 % 500 mL IVPB     1,500 mg 250 mL/hr over 120 Minutes Intravenous  Once 10/16/2013 1038 09/29/2013 1300   10/05/2013 1030  vancomycin (VANCOCIN) IVPB 1000 mg/200 mL premix  Status:  Discontinued     1,000 mg 200 mL/hr over 60 Minutes Intravenous  Once 10/24/2013 1024 10/17/2013 1035        Medications: Scheduled Meds: . antiseptic oral rinse  15 mL Mouth Rinse QID  . aspirin  81 mg Oral Daily  . [START ON 10/06/2013] azithromycin  1,200 mg Oral Weekly  . ceFEPime (MAXIPIME) IV  1 g Intravenous 3 times per day  . chlorhexidine  15 mL Mouth Rinse BID  . cloNIDine  0.1 mg Oral TID  . dolutegravir  50 mg Oral Daily  . emtricitabine-tenofovir  1 tablet Oral Daily  . enoxaparin (LOVENOX) injection  40 mg Subcutaneous Q24H  . methylPREDNISolone (SOLU-MEDROL) injection  125 mg Intravenous BID  . pantoprazole sodium  40 mg Per Tube Daily  . rosuvastatin  10 mg Oral q1800  . sulfamethoxazole-trimethoprim  450 mg Intravenous Q8H   Continuous Infusions: . feeding supplement (VITAL AF 1.2 CAL) 1,000 mL (10/02/13 1411)  . fentaNYL infusion INTRAVENOUS 200 mcg/hr (10/02/13 1202)   PRN Meds:.sodium chloride, acetaminophen, albuterol, dextromethorphan-guaiFENesin, diphenhydrAMINE, fentaNYL, midazolam, senna-docusate, traMADol, zolpidem    Objective: Weight change:   Intake/Output Summary (Last 24 hours) at 10/02/13 1557 Last data filed at 10/02/13 1400  Gross per 24 hour  Intake   5010 ml  Output   4625 ml  Net    385  ml   Blood pressure 115/54, pulse 94, temperature 100.6 F (38.1 C), temperature source Oral, resp. rate 23, height 5\' 10"  (1.778 m), weight 167 lb 5.3 oz (75.9 kg), SpO2 95.00%. Temp:  [97.7 F (36.5 C)-102.4 F (39.1 C)] 100.6 F (38.1 C) (04/05 1400) Pulse Rate:  [60-141] 94 (04/05 1400) Resp:  [19-51] 23 (04/05 1400) BP: (106-195)/(51-103) 115/54 mmHg (04/05 1400) SpO2:  [86 %-99 %] 95 % (04/05 1400) FiO2 (%):  [55 %-100 %] 70 % (04/05 1223) Weight:  [167 lb 5.3 oz (75.9 kg)] 167 lb 5.3 oz (75.9 kg) (04/05 0400)  Physical Exam: General: Alert and awake, oriented on ventilator diaphoretic  HEENT:  EOMI  CVS tachycardic ,  no murmur rubs or gallops Chest:rhonchi anteriorly Abdomen: soft nontender, nondistended, normal bowel sounds, Extremities:  no  clubbing or edema noted bilaterally Neuro: nonfocal  CBC:   Recent Labs  10/13/2013 1005 10/02/13 0326  WBC 5.7 4.7  HGB 10.1* 8.9*  HCT 29.3* 25.9*  NA 126* 134*  K 4.1 4.4  CL 93* 102  CO2 20 24  BUN 20 12  CREATININE 1.04 0.94     BMET  Recent Labs  10/16/2013 1005 10/02/13 0326  NA 126* 134*  K 4.1 4.4  CL 93* 102  CO2 20 24  GLUCOSE 131* 134*  BUN 20 12  CREATININE 1.04 0.94  CALCIUM 8.2* 8.0*     Liver Panel   Recent Labs  10/04/2013 1005  PROT 6.6  ALBUMIN 2.5*  AST 43*  ALT 47  ALKPHOS 53  BILITOT 0.2*       Sedimentation Rate No results found for this basename: ESRSEDRATE,  in the last 72 hours C-Reactive Protein No results found for this basename: CRP,  in the last 72 hours  Micro Results: Recent Results (from the past 240 hour(s))  CULTURE, BLOOD (ROUTINE X 2)     Status: None   Collection Time    10/24/2013 10:20 AM      Result Value Ref Range Status   Specimen Description BLOOD LEFT ANTECUBITAL   Final   Special Requests BOTTLES DRAWN AEROBIC AND ANAEROBIC 5 CC EACH   Final   Culture  Setup Time     Final   Value: 10/04/2013 14:52     Performed at [facility]   Culture     Final   Value:        BLOOD CULTURE RECEIVED NO GROWTH TO DATE CULTURE WILL BE HELD FOR 5 DAYS BEFORE ISSUING A FINAL NEGATIVE REPORT     Performed at [facility]   Report Status PENDING   Incomplete  CULTURE, BLOOD (ROUTINE X 2)     Status: None   Collection Time    10/02/2013 10:35 AM      Result Value Ref Range Status   Specimen Description BLOOD RIGHT ANTECUBITAL   Final   Special Requests BOTTLES DRAWN AEROBIC AND ANAEROBIC 5CC EACH   Final   Culture  Setup Time     Final   Value: 10/02/2013 14:52     Performed at [facility]   Culture     Final   Value:        BLOOD CULTURE RECEIVED NO GROWTH TO DATE CULTURE WILL BE HELD FOR 5 DAYS BEFORE ISSUING A FINAL NEGATIVE REPORT     Performed at [facility]   Report  Status PENDING   Incomplete    Studies/Results: Dg Abd 1 View  10/02/2013  CLINICAL DATA:  NG tube placement  EXAM: ABDOMEN - 1 VIEW  COMPARISON:  None.  FINDINGS: NG tube within the proximal to mid stomach. Mild gaseous distention of the stomach. Lower abdominal small bowel distention noted but there is air in the colon. No acute osseous finding demonstrated.  IMPRESSION: NG tube within the stomach.   Electronically Signed   By: Ruel Favors M.D.   On: 10/02/2013 11:11   Ct Angio Chest Pe W/cm &/or Wo Cm  2013/10/29   CLINICAL DATA:  Tachycardia and fever  EXAM: CT ANGIOGRAPHY CHEST WITH CONTRAST  TECHNIQUE: Multidetector CT imaging of the chest was performed using the standard protocol during bolus administration of intravenous contrast. Multiplanar CT image reconstructions and MIPs were obtained to evaluate the vascular anatomy.  CONTRAST:  OMNIPAQUE IOHEXOL 350 MG/ML SOLN  COMPARISON:  09/02/2013  FINDINGS: The lungs are well aerated bilaterally and demonstrate diffuse infiltrate throughout the left lung as well as patchy infiltrative changes in the right lung worst in the right middle lobe. These changes are similar to that seen on the recent chest x-ray. The thoracic inlet is within normal limits. 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 The thoracic aorta is otherwise within normal limits. The pulmonary artery is well visualized and shows a normal branching pattern. No intraluminal filling defect to suggest pulmonary embolism is identified. No significant hilar adenopathy is seen. A few scattered mediastinal lymph nodes are noted. The largest of these lies in the aortic or pulmonary window and measures 9 mm in short axis. This is increased in size from the prior exam.  Scanning into the upper abdomen reveals no acute abnormality. No gross bony abnormality is seen.  Review of the MIP images confirms the above findings.  IMPRESSION: No evidence of pulmonary embolism.  Diffuse  bilateral infiltrates similar to that seen on recent chest x-ray but new from the prior CT examination. Given the patient's underlying clinical history this may represent a opportunistic infection. Further workup is recommended.  Reactive aorticopulmonary window node on the left.  Stable aberrant right subclavian artery.   Electronically Signed   By: Alcide Clever M.D.   On: 10/29/13 11:43   Dg Chest Port 1 View  10/02/2013   CLINICAL DATA:  central line placement  EXAM: PORTABLE CHEST - 1 VIEW  COMPARISON:  DG CHEST 1V PORT dated 10-29-2013  FINDINGS: Endotracheal tube has been inserted tip 4.4 cm above the carina. A right internal jugular catheter is appreciated tip at the level superior vena cava. Stable bilateral pulmonary opacities are appreciated right lung base, and throughout the left hemithorax. No new focal regions of consolidation no evidence of pneumothorax. Degenerative changes within the right lower shoulders.  IMPRESSION: Support lines and tubes adequately positioned  Bilateral pneumonitis unchanged from prior.   Electronically Signed   By: Salome Holmes M.D.   On: 10/02/2013 09:57   Dg Chest Port 1 View  29-Oct-2013   CLINICAL DATA:  Worsening shortness of breath and hypoxia.  EXAM: PORTABLE CHEST - 1 VIEW  COMPARISON:  Chest radiograph performed at earlier today at 10:34 a.m., and CTA of the chest performed earlier today at 11:15 a.m.  FINDINGS: There has been interval worsening in diffuse left-sided and right basilar airspace opacification, which may reflect opportunistic infection given the patient's history. No pleural effusion or pneumothorax is seen.  The cardiomediastinal silhouette is borderline normal in size. No acute osseous abnormalities are identified.  IMPRESSION: Interval worsening in diffuse left-sided and  right basilar airspace opacification, which may reflect opportunistic infection given the patient's history.   Electronically Signed   By: Roanna Raider M.D.   On: Oct 09, 2013  21:21   Dg Chest Portable 1 View  2013-10-09   CLINICAL DATA:  TACHYCARDIA SHORTNESS OF BREATH TACHYCARDIA SHORTNESS OF BREATH  EXAM: PORTABLE CHEST - 1 VIEW  COMPARISON:  DG CHEST 2 VIEW dated 09/19/2013  FINDINGS: Low lung volumes. Cardiac silhouette is within normal limits. Diffuse pulmonary opacities are appreciated within the left hemi thorax and to a lesser extent right lung base. Osteoarthritic changes are appreciated within the shoulders.  IMPRESSION: Increase conspicuity of the diffuse infiltrates left hemi thorax with interval development of a right lower lobe infiltrate.   Electronically Signed   By: Salome Holmes M.D.   On: 10/09/13 10:56      Assessment/Plan:  Principal Problem:   Pneumonia Active Problems:   Acute respiratory failure   AIDS   PCP (pneumocystis carinii pneumonia)   CMV (cytomegalovirus)   Sacral decubitus ulcer, stage II   Hyponatremia    Sylvio Weatherall is a 48 y.o. male with  HIV/AIDS and likely recurrent PCP PNA +/-IRIS vs HCAP  #1 PCP PNA: likely recurrent in setting of dropping down to prophylactic dose +/- IRIS. Overnight intubated and now with deep cultures from airway with pink secretions that sound like PCP by description (d/w Dr. Kendrick Fries)  --continue TMP/SMX --agre with higher dose IV steroids  (would repeat an entire NEW 21 day course with day #1 being first day of this admission) --continue cefepime --fu on BAL cultures  #2 HIV/AIDS:  I spent greater than 40 minutes with the patient, wife and patients Mother and Father including greater than 50% of time in face to face counsel of the patient and in coordination of their care.  --will change to [place] and Truvada now that pt is on ventilator and not reliable data on crushing STRIBILD --needs labs rechecked in one month from start of therapy  --continue prophhylactic azithromycin   Dr. Drue Second will be covering this week at [facility].    LOS: 1 day   Acey Lav 10/02/2013,  3:57 PM

## 2013-10-02 NOTE — Procedures (Signed)
Intubation Procedure Note William Bender [id number] 10/09/1965  Procedure: Intubation Indications: Respiratory insufficiency  Procedure Details Consent: Risks of procedure as well as the alternatives and risks of each were explained to the (patient/caregiver).  Consent for procedure obtained. Time Out: Verified patient identification, verified procedure, site/side was marked, verified correct patient position, special equipment/implants available, medications/allergies/relevent history reviewed, required imaging and test results available.  Performed  Drugs Versed 2mg , Fentanyl 50mcg, Etomidate 20mg , Rocuronium 70mg  DL x 1 with GS3 blade Grade 1 view 8.0 tube passed through cords under direct visualization Placement confirmed with bilateral breath sounds, positive EtCO2 change and smoke in tube   Evaluation Hemodynamic Status: BP stable throughout; O2 sats: transiently fell during during procedure Patient's Current Condition: stable Complications: No apparent complications Patient did tolerate procedure well. Chest X-ray ordered to verify placement.  CXR: pending.   William Bender 10/02/2013

## 2013-10-03 ENCOUNTER — Encounter (HOSPITAL_COMMUNITY): Payer: Self-pay | Admitting: Emergency Medicine

## 2013-10-03 ENCOUNTER — Inpatient Hospital Stay (HOSPITAL_COMMUNITY): Payer: BC Managed Care – PPO

## 2013-10-03 LAB — GLUCOSE, CAPILLARY
GLUCOSE-CAPILLARY: 150 mg/dL — AB (ref 70–99)
GLUCOSE-CAPILLARY: 155 mg/dL — AB (ref 70–99)
Glucose-Capillary: 164 mg/dL — ABNORMAL HIGH (ref 70–99)
Glucose-Capillary: 171 mg/dL — ABNORMAL HIGH (ref 70–99)

## 2013-10-03 LAB — BLOOD GAS, ARTERIAL
Acid-base deficit: 3.1 mmol/L — ABNORMAL HIGH (ref 0.0–2.0)
Bicarbonate: 22.1 mEq/L (ref 20.0–24.0)
DRAWN BY: 317871
FIO2: 0.7 %
LHR: 22 {breaths}/min
MECHVT: 440 mL
O2 Saturation: 94.1 %
PCO2 ART: 42.9 mmHg (ref 35.0–45.0)
PEEP: 10 cmH2O
PO2 ART: 78.7 mmHg — AB (ref 80.0–100.0)
Patient temperature: 98.3
TCO2: 21 mmol/L (ref 0–100)
pH, Arterial: 7.332 — ABNORMAL LOW (ref 7.350–7.450)

## 2013-10-03 LAB — BASIC METABOLIC PANEL
BUN: 18 mg/dL (ref 6–23)
CO2: 23 meq/L (ref 19–32)
Calcium: 8.2 mg/dL — ABNORMAL LOW (ref 8.4–10.5)
Chloride: 98 mEq/L (ref 96–112)
Creatinine, Ser: 0.75 mg/dL (ref 0.50–1.35)
GFR calc Af Amer: >90 mL/min (ref >90)
GFR calc non Af Amer: >90 mL/min (ref >90)
GLUCOSE: 135 mg/dL — AB (ref 70–99)
POTASSIUM: 4.6 meq/L (ref 3.7–5.3)
SODIUM: 131 meq/L — AB (ref 137–147)

## 2013-10-03 LAB — CBC WITH DIFFERENTIAL/PLATELET
Basophils Absolute: 0 10*3/uL (ref 0.0–0.1)
Basophils Relative: 0 % (ref 0–1)
EOS ABS: 0 10*3/uL (ref 0.0–0.7)
EOS PCT: 0 % (ref 0–5)
HCT: 28.3 % — ABNORMAL LOW (ref 39.0–52.0)
HEMOGLOBIN: 9.5 g/dL — AB (ref 13.0–17.0)
LYMPHS ABS: 0.4 10*3/uL — AB (ref 0.7–4.0)
Lymphocytes Relative: 7 % — ABNORMAL LOW (ref 12–46)
MCH: 29.6 pg (ref 26.0–34.0)
MCHC: 33.6 g/dL (ref 30.0–36.0)
MCV: 88.2 fL (ref 78.0–100.0)
Monocytes Absolute: 0.1 10*3/uL (ref 0.1–1.0)
Monocytes Relative: 2 % — ABNORMAL LOW (ref 3–12)
Neutro Abs: 5.5 10*3/uL (ref 1.7–7.7)
Neutrophils Relative %: 91 % — ABNORMAL HIGH (ref 43–77)
Platelets: 185 10*3/uL (ref 150–400)
RBC: 3.21 MIL/uL — AB (ref 4.22–5.81)
RDW: 15.1 % (ref 11.5–15.5)
WBC: 6 10*3/uL (ref 4.0–10.5)

## 2013-10-03 LAB — PNEUMOCYSTIS JIROVECI SMEAR BY DFA: Pneumocystis jiroveci Ag: NEGATIVE

## 2013-10-03 MED ORDER — LACTULOSE 10 GM/15ML PO SOLN
30.0000 g | Freq: Every day | ORAL | Status: DC | PRN
  Filled 2013-10-03: qty 45

## 2013-10-03 MED ORDER — PROPOFOL 10 MG/ML IV EMUL
5.0000 ug/kg/min | INTRAVENOUS | Status: DC
  Administered 2013-10-03: 20 ug/kg/min via INTRAVENOUS
  Administered 2013-10-03: 10 ug/kg/min via INTRAVENOUS
  Administered 2013-10-04 (×3): 60 ug/kg/min via INTRAVENOUS
  Administered 2013-10-04: 59.913 ug/kg/min via INTRAVENOUS
  Administered 2013-10-05 (×2): 60 ug/kg/min via INTRAVENOUS
  Administered 2013-10-05: 70 ug/kg/min via INTRAVENOUS
  Administered 2013-10-05: 60 ug/kg/min via INTRAVENOUS
  Administered 2013-10-06 (×3): 70 ug/kg/min via INTRAVENOUS
  Administered 2013-10-06: 65 ug/kg/min via INTRAVENOUS
  Administered 2013-10-06 (×2): 70 ug/kg/min via INTRAVENOUS
  Administered 2013-10-07 (×2): 60 ug/kg/min via INTRAVENOUS
  Administered 2013-10-07: 70 ug/kg/min via INTRAVENOUS
  Administered 2013-10-07 – 2013-10-08 (×3): 60 ug/kg/min via INTRAVENOUS
  Administered 2013-10-08: 70 ug/kg/min via INTRAVENOUS
  Administered 2013-10-08 – 2013-10-09 (×5): 60 ug/kg/min via INTRAVENOUS
  Administered 2013-10-09: 65 ug/kg/min via INTRAVENOUS
  Administered 2013-10-09 (×2): 60 ug/kg/min via INTRAVENOUS
  Administered 2013-10-09 – 2013-10-10 (×2): 65 ug/kg/min via INTRAVENOUS
  Administered 2013-10-10: 70 ug/kg/min via INTRAVENOUS
  Administered 2013-10-10: 65 ug/kg/min via INTRAVENOUS
  Administered 2013-10-10 – 2013-10-12 (×14): 70 ug/kg/min via INTRAVENOUS
  Administered 2013-10-12 – 2013-10-13 (×2): 60 ug/kg/min via INTRAVENOUS
  Administered 2013-10-13 (×5): 70 ug/kg/min via INTRAVENOUS
  Administered 2013-10-13: 60 ug/kg/min via INTRAVENOUS
  Administered 2013-10-14: 85 ug/kg/min via INTRAVENOUS
  Administered 2013-10-14 (×2): 70 ug/kg/min via INTRAVENOUS
  Administered 2013-10-14: 100 ug/kg/min via INTRAVENOUS
  Administered 2013-10-14 (×4): 70 ug/kg/min via INTRAVENOUS
  Administered 2013-10-15 – 2013-10-17 (×23): 100 ug/kg/min via INTRAVENOUS
  Administered 2013-10-17: 75 ug/kg/min via INTRAVENOUS
  Administered 2013-10-17 (×2): 100 ug/kg/min via INTRAVENOUS
  Administered 2013-10-17: 75 ug/kg/min via INTRAVENOUS
  Administered 2013-10-17 – 2013-10-18 (×5): 100 ug/kg/min via INTRAVENOUS
  Administered 2013-10-18: 75 ug/kg/min via INTRAVENOUS
  Administered 2013-10-18: 100 ug/kg/min via INTRAVENOUS
  Filled 2013-10-03 (×3): qty 100
  Filled 2013-10-03: qty 200
  Filled 2013-10-03: qty 100
  Filled 2013-10-03: qty 200
  Filled 2013-10-03 (×16): qty 100
  Filled 2013-10-03: qty 200
  Filled 2013-10-03 (×13): qty 100
  Filled 2013-10-03: qty 200
  Filled 2013-10-03 (×17): qty 100
  Filled 2013-10-03: qty 200
  Filled 2013-10-03 (×32): qty 100
  Filled 2013-10-03 (×2): qty 200
  Filled 2013-10-03 (×14): qty 100
  Filled 2013-10-03: qty 200
  Filled 2013-10-03 (×7): qty 100
  Filled 2013-10-03: qty 200
  Filled 2013-10-03 (×2): qty 100

## 2013-10-03 MED ORDER — DOCUSATE SODIUM 50 MG/5ML PO LIQD
100.0000 mg | Freq: Every day | ORAL | Status: DC
  Administered 2013-10-03 – 2013-10-08 (×6): 100 mg via ORAL
  Filled 2013-10-03 (×7): qty 10

## 2013-10-03 MED ORDER — SODIUM CHLORIDE 0.9 % IV SOLN
5.0000 mg/kg | Freq: Two times a day (BID) | INTRAVENOUS | Status: DC
  Administered 2013-10-03 – 2013-10-27 (×48): 385 mg via INTRAVENOUS
  Filled 2013-10-03 (×50): qty 385

## 2013-10-03 NOTE — Progress Notes (Signed)
10/03/13 1700  Clinical Encounter Type  Visited With Patient and family together;Health care provider William Bender(William Bender and wife William Bender; William EdwardsSheila Main, William Bender)  Visit Type Spiritual support;Social support  Referral From Family  Spiritual Encounters  Spiritual Needs Prayer;Emotional  Stress Factors  Patient Stress Factors Major life changes;Loss of control (dearly wants daughter William Bender to focus on exams )  Family Stress Factors Major life changes;Loss of control;Health changes   Family requested chaplain visit.  William BroodBrent worked very hard to write notes about his prayer concerns, which centered around his wish for his daughter William Bender, a Consulting civil engineerstudent at William Bender, to be able to focus on exams.  Per William BroodBrent and his wife William Bender, their children are NOT aware of his primary diagnosis.  Family values pastoral presence, prayer, hugs, emotional support.  Spiritual Care will follow for support, but please also page whenever needed:  [phone].  Thank you.  [address]Chaplain William Bender, [place] [phone]

## 2013-10-03 NOTE — Telephone Encounter (Signed)
Yeah I am sorry I saw him myself this weeekend and forgot to notify you. I think he can just keep his appt in mid April

## 2013-10-03 NOTE — Progress Notes (Signed)
CARE MANAGEMENT NOTE 10/03/2013  Patient:  William Bender,William Bender   Account Number:  [id number]  Date Initiated:  10/03/2013  Documentation initiated by:  DAVIS,RHONDA  Subjective/Objective Assessment:   pt admitted on 10/05/2013/required [id number]/hcap and failed outpt treatment moved to icu on [id number].     Action/Plan:   tbd, will follow and see what needs are present once extubated and awake.   Anticipated DC Date:  10/05/2013   Anticipated DC Plan:  HOME/SELF CARE  In-house referral  NA      DC Planning Services  NA      [facility]   Choice offered to / List presented to:  NA   DME arranged  NA      DME agency  NA     HH arranged  NA      HH agency  NA   Status of service:  In process, will continue to follow Medicare Important Message given?  NA - LOS <3 / Initial given by admissions (If response is "NO", the following Medicare IM given date fields will be blank) Date Medicare IM given:   Date Additional Medicare IM given:    Discharge Disposition:    Per UR Regulation:  Reviewed for med. necessity/level of care/duration of stay  If discussed at Long Length of Stay Meetings, dates discussed:    Comments:  [id number]/Rhonda Stark JockDavis, RN, BSN, [place] [phone] Chart Reviewed for discharge and hospital needs. Discharge needs at time of review: None present will follow for needs. Review of patient progress due on [id number].

## 2013-10-03 NOTE — Telephone Encounter (Signed)
[facility] and Dr. Drue SecondSnider has been notified. William MolaJacqueline Montrae Braithwaite

## 2013-10-03 NOTE — Progress Notes (Addendum)
PULMONARY / CRITICAL CARE MEDICINE   Name: William Bender MRN: [id number] DOB: July 28, 1965    ADMISSION DATE:  10-16-13 CONSULTATION DATE:  10/16/13  REFERRING MD :  Rancour PRIMARY SERVICE: PCCM  CHIEF COMPLAINT:  Shortness of breath, fever, cough  BRIEF PATIENT DESCRIPTION: 48 y/o male recently diagnosed with HIV/AIDS -CD4 =30 on 3/31 and treated empirically for PCP returned to the [facility] ED on 4/4 with acute hypoxemic respiratory failure. Note that sputum PCP was neg 3/18  SIGNIFICANT EVENTS / STUDIES:  4/4 CT angio > no PE, diffuse bilateral GGO and patchy consolidation worse in the bases L > R  LINES / TUBES: 4/5 ETT >> 4/5 R IJ CVL >>  CULTURES: 4/4 blood >> 4/4 urine hist >> 4/4 CMV pcr >> POS 4/4 PCP DFA induced sputum >> 4/5 PCP DFA BAL LUL >> 4/5 resp culture bal >> 4/5 fungal culture bal >> 4/5 afb culture bal >> 4/5 cmv bal >>  ANTIBIOTICS: 4/4 Bacrim >> 4/4 Vanc >>  4/4 Cefepime >>  SUBJECTIVE:  Febrile 4/5 am Sedated Remains on 70%/ +10  VITAL SIGNS: Temp:  [97.6 F (36.4 C)-101.3 F (38.5 C)] 98.4 F (36.9 C) (04/06 0800) Pulse Rate:  [59-141] 73 (04/06 0700) Resp:  [11-32] 11 (04/06 0700) BP: (100-195)/(50-101) 122/63 mmHg (04/06 0700) SpO2:  [89 %-99 %] 92 % (04/06 0700) FiO2 (%):  [60 %-70 %] 60 % (04/06 0852) Weight:  [76.5 kg (168 lb 10.4 oz)] 76.5 kg (168 lb 10.4 oz) (04/06 0400) HEMODYNAMICS:   VENTILATOR SETTINGS: Vent Mode:  [-] PRVC FiO2 (%):  [60 %-70 %] 60 % Set Rate:  [22 bmp] 22 bmp Vt Set:  [440 mL] 440 mL PEEP:  [10 cmH20] 10 cmH20 Plateau Pressure:  [19 cmH20-24 cmH20] 19 cmH20 INTAKE / OUTPUT: Intake/Output     04/05 0701 - 04/06 0700 04/06 0701 - 04/07 0700   I.V. (mL/kg) 1740 (22.7) 60 (0.8)   Other 745    NG/GT 650 60   IV Piggyback 1706.3    Total Intake(mL/kg) 4841.3 (63.3) 120 (1.6)   Urine (mL/kg/hr) 2425 (1.3) 150 (0.9)   Total Output 2425 150   Net +2416.3 -30          PHYSICAL EXAMINATION:  Gen:  sedated on vent, acutely ill HEENT: NCAT,  EOMi, OP clear PULM: Crackles Left greater than right, good air movement CV: tachy, regular, no mgr, no JVD AB: BS+, soft, nontender, no hsm Neuro  - Ext: warm, no edema, no clubbing, no cyanosis Derm: diaphoretic, flushed   LABS:  CBC  Recent Labs Lab Oct 16, 2013 1005 10/02/13 0326 10/03/13 0357  WBC 5.7 4.7 6.0  HGB 10.1* 8.9* 9.5*  HCT 29.3* 25.9* 28.3*  PLT 219 178 185   Coag's No results found for this basename: APTT, INR,  in the last 168 hours BMET  Recent Labs Lab 10/16/13 1005 10/02/13 0326 10/03/13 0357  NA 126* 134* 131*  K 4.1 4.4 4.6  CL 93* 102 98  CO2 20 24 23   BUN 20 12 18   CREATININE 1.04 0.94 0.75  GLUCOSE 131* 134* 135*   Electrolytes  Recent Labs Lab 2013/10/16 1005 10/02/13 0326 10/03/13 0357  CALCIUM 8.2* 8.0* 8.2*   Sepsis Markers  Recent Labs Lab 10/16/2013 1019  LATICACIDVEN 1.56   ABG  Recent Labs Lab 10/16/2013 2040 10/02/13 1152 10/03/13 0600  PHART 7.437 7.378 7.332*  PCO2ART 31.1* 40.5 42.9  PO2ART 121.0* 187.0* 78.7*   Liver Enzymes  Recent Labs Lab  09/27/13 1114 10/18/2013 1005  AST 33 43*  ALT 40 47  ALKPHOS 46 53  BILITOT 0.3 0.2*  ALBUMIN 3.1* 2.5*   Cardiac Enzymes  Recent Labs Lab 10/19/2013 1005  TROPONINI <0.30  PROBNP 460.5*   Glucose  Recent Labs Lab 10/02/13 1306 10/02/13 1602 10/02/13 2016 10/03/13 0735  GLUCAP 158* 112* 152* 150*    LDH 395  Imaging  4/6 CXR: unchanged diffuse hazy pulmonary opacities and consolidation bilaterally  ASSESSMENT / PLAN:  PULMONARY A: Acute hypoxemic respiratory failure : This is most likely an acute infectious process PCP/ Immune reconstitution vs CMV pneumonitis P:   - ct PEEP 10cm, TVol 6cc/kg -  ARDS protocol -Await BAL results  INFECTIOUS A:   Acute 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> ddx broad: PCP, HCAP, fungal, less likely AFB or viral (CMV) Stage II buttock ulcers P:   - Treat HCAP and  PCP with Bactrim and broad-spectrum antibiotics - bronch for bal cultures today - Continue antiviral therapy for HIV - Continue MAC prophylaxis - wound care - appreciate ID - does he need therapy for CMV ?  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   CC time outside of procedures 45 minutes  Latonia Conrow Oretha MilchV. Alysiah Suppa MD. [facility] Inc[facility]CCP. [facility] Pager [phone] If no response call [phone]    10/03/2013, 9:06 AM

## 2013-10-03 NOTE — Plan of Care (Signed)
Problem: Consults Goal: Respiratory Problems Patient Education See Patient Education Module for education specifics.  Outcome: Progressing Education  And updates provided to both patient and family (wife, mother and father only). Goal: Skin Care Protocol Initiated - if Braden Score 18 or less If consults are not indicated, leave blank or document N/A  Outcome: Progressing Allvyn intact to sacral ulcer.  Problem: ICU Phase Progression Outcomes Goal: O2 sats trending toward baseline Outcome: Progressing Vent support with Fi02 currently at 60% and Peep at 10.  0s sats are ranging between 89-92%.

## 2013-10-03 NOTE — Plan of Care (Signed)
Problem: Phase I Progression Outcomes Goal: Progressing towards optiumm acitivities Outcome: Not Progressing Bedrest at current time. Goal: Optimized method of communication. Outcome: Completed/Met Date Met:  10/03/13 Pt able to communicate by writing and hand gestures.

## 2013-10-03 NOTE — Consult Note (Signed)
WOC wound consult note Reason for Consult: Moisture Associated Skin Damage to right anal region.  Wound type: MASD and pressure contributory Pressure Ulcer POA: Yes Measurement: 2 cm x 2 cm x 0.1 cm Wound bed:100% pale pink Drainage (amount, consistency, odor) Minimal clear drainage Periwound: Intact, slightly pink Dressing procedure/placement/frequency: Allevyn silicone border foam to right buttock wound to protect and absorb moisture.  Change every 3 days and PRN soilage.  Will not follow at this time.  Please re-consult if needed.  Maple HudsonKaren Burnie Hank RN BSN CWON Pager [phone]

## 2013-10-03 NOTE — Progress Notes (Signed)
[facility]  ID: 48 yo M newly diagnosed HIV, with likely recurrent PCP PNA +/-IRIS vs HCAP  Subjective: Remains intubated. Afebrile since initiation of steroids. His wife reports that in the last month he has had to change his reading glasses and he reports intermittent blurry vision  Medications: Abtx: #3 cefepime, bactrim  Scheduled Meds: . antiseptic oral rinse  15 mL Mouth Rinse QID  . aspirin  81 mg Oral Daily  . [START ON 10/06/2013] azithromycin  1,200 mg Oral Weekly  . ceFEPime (MAXIPIME) IV  1 g Intravenous 3 times per day  . chlorhexidine  15 mL Mouth Rinse BID  . docusate  100 mg Oral Daily  . dolutegravir  50 mg Oral Daily  . emtricitabine-tenofovir  1 tablet Oral Daily  . enoxaparin (LOVENOX) injection  40 mg Subcutaneous Q24H  . methylPREDNISolone (SOLU-MEDROL) injection  125 mg Intravenous BID  . pantoprazole sodium  40 mg Per Tube Daily  . rosuvastatin  10 mg Oral q1800  . sulfamethoxazole-trimethoprim  450 mg Intravenous Q8H   Continuous Infusions: . feeding supplement (VITAL AF 1.2 CAL) 1,000 mL (10/02/13 1806)  . fentaNYL infusion INTRAVENOUS 300 mcg/hr (10/03/13 1238)  . propofol 10 mcg/kg/min (10/03/13 1006)     Objective: Weight change: 13 lb 10.4 oz (6.192 kg)  Intake/Output Summary (Last 24 hours) at 10/03/13 1251 Last data filed at 10/03/13 1200  Gross per 24 hour  Intake 4011.26 ml  Output   1575 ml  Net 2436.26 ml   Blood pressure 111/52, pulse 65, temperature 98.4 F (36.9 C), temperature source Axillary, resp. rate 20, height 5\' 10"  (1.778 m), weight 168 lb 10.4 oz (76.5 kg), SpO2 91.00%. Temp:  [97.6 F (36.4 C)-100.6 F (38.1 C)] 98.4 F (36.9 C) (04/06 0800) Pulse Rate:  [59-97] 65 (04/06 1200) Resp:  [11-24] 20 (04/06 1200) BP: (100-149)/(50-89) 111/52 mmHg (04/06 1200) SpO2:  [88 %-97 %] 91 % (04/06 1200) FiO2 (%):  [60 %-70 %] 60 % (04/06 1200) Weight:  [168 lb 10.4 oz (76.5 kg)] 168 lb 10.4 oz (76.5  kg) (04/06 0400)  Physical Exam: General: Alert and awake, oriented on ventilator  HEENT:  EOMI  CVS tachycardic ,  no murmur rubs or gallops Chest:rhonchi anteriorly Abdomen: soft nontender, nondistended, normal bowel sounds, Extremities: no  clubbing or edema noted bilaterally Neuro: nonfocal  CBC:   Recent Labs  10/02/13 0326 10/03/13 0357  WBC 4.7 6.0  HGB 8.9* 9.5*  HCT 25.9* 28.3*  NA 134* 131*  K 4.4 4.6  CL 102 98  CO2 24 23  BUN 12 18  CREATININE 0.94 0.75     BMET  Recent Labs  10/02/13 0326 10/03/13 0357  NA 134* 131*  K 4.4 4.6  CL 102 98  CO2 24 23  GLUCOSE 134* 135*  BUN 12 18  CREATININE 0.94 0.75  CALCIUM 8.0* 8.2*     Liver Panel   Recent Labs  10/03/2013 1005  PROT 6.6  ALBUMIN 2.5*  AST 43*  ALT 47  ALKPHOS 53  BILITOT 0.2*      Micro Results: Recent Results (from the past 240 hour(s))  CULTURE, BLOOD (ROUTINE X 2)     Status: None   Collection Time    10/16/2013 10:20 AM      Result Value Ref Range Status   Specimen Description BLOOD LEFT ANTECUBITAL   Final   Special Requests BOTTLES DRAWN AEROBIC AND ANAEROBIC 5 CC EACH  Final   Culture  Setup Time     Final   Value: 10/15/2013 14:52     [facility]   Culture     Final   Value:        BLOOD CULTURE RECEIVED NO GROWTH TO DATE CULTURE WILL BE HELD FOR 5 DAYS BEFORE ISSUING A FINAL NEGATIVE REPORT     Performed at [facility]   Report Status PENDING   Incomplete  CULTURE, BLOOD (ROUTINE X 2)     Status: None   Collection Time    09/30/2013 10:35 AM      Result Value Ref Range Status   Specimen Description BLOOD RIGHT ANTECUBITAL   Final   Special Requests BOTTLES DRAWN AEROBIC AND ANAEROBIC 5CC EACH   Final   Culture  Setup Time     Final   Value: 10/13/2013 14:52     Performed at [facility]   Culture     Final   Value:        BLOOD CULTURE RECEIVED NO GROWTH TO DATE CULTURE WILL BE HELD FOR 5 DAYS BEFORE ISSUING A FINAL  NEGATIVE REPORT     Performed at [facility]   Report Status PENDING   Incomplete  PNEUMOCYSTIS JIROVECI SMEAR BY DFA     Status: None   Collection Time    10/02/13 10:08 AM      Result Value Ref Range Status   Specimen Source-[facility] BRONCHIAL ALVEOLAR LAVAGE   Corrected   Comment: CORRECTED ON 04/05 AT 1024: PREVIOUSLY REPORTED AS BRONCHIAL WASHINGS   Pneumocystis jiroveci Ag NEGATIVE   Final   Comment: [facility]  CULTURE, RESPIRATORY (NON-EXPECTORATED)     Status: None   Collection Time    10/02/13 10:08 AM      Result Value Ref Range Status   Specimen Description BRONCHIAL ALVEOLAR LAVAGE   Final   Special Requests Immunocompromised   Final   Gram Stain     Final   Value: RARE WBC PRESENT, PREDOMINANTLY PMN     RARE SQUAMOUS EPITHELIAL CELLS PRESENT     NO ORGANISMS SEEN     [facility]formed at [facility]   Culture     Final   Value: NO GROWTH 1 DAY     Performed at [facility]   Report Status PENDING   Incomplete    Studies/Results: Dg Abd 1 View  10/02/2013   CLINICAL DATA:  NG tube placement  EXAM: ABDOMEN - 1 VIEW  COMPARISON:  None.  FINDINGS: NG tube within the proximal to mid stomach. Mild gaseous distention of the stomach. Lower abdominal small bowel distention noted but there is air in the colon. No acute osseous finding demonstrated.  IMPRESSION: NG tube within the stomach.   Electronically Signed   By: Ruel Favorsrevor  Shick M.D.   On: 10/02/2013 11:11   Dg Chest Port 1 View  10/03/2013   CLINICAL DATA:  Hypoxia  EXAM: PORTABLE CHEST - 1 VIEW  COMPARISON:  October 02, 2013  FINDINGS: Endotracheal tube tip is 3.5 cm above the carina. Central catheter tip is in the superior vena cava. Nasogastric tube tip and side port are below the diaphragm. No pneumothorax.  There is extensive interstitial and alveolar edema, more on the left on the right. There is consolidation in both lower lobes, stable on the left and slightly increased on the  right. Heart is enlarged. Pulmonary vascularity shows pulmonary  venous hypertension. No adenopathy appreciable.  IMPRESSION: Tube and catheter positions as described without pneumothorax. Underlying congestive heart failure. Increase consolidation in the right base may represent increase in edema or possibly superimposed pneumonia.   Electronically Signed   By: Bretta Bang M.D.   On: 10/03/2013 07:06   Dg Chest Port 1 View  10/02/2013   CLINICAL DATA:  central line placement  EXAM: PORTABLE CHEST - 1 VIEW  COMPARISON:  DG CHEST 1V PORT dated 10/20/2013  FINDINGS: Endotracheal tube has been inserted tip 4.4 cm above the carina. A right internal jugular catheter is appreciated tip at the level superior vena cava. Stable bilateral pulmonary opacities are appreciated right lung base, and throughout the left hemithorax. No new focal regions of consolidation no evidence of pneumothorax. Degenerative changes within the right lower shoulders.  IMPRESSION: Support lines and tubes adequately positioned  Bilateral pneumonitis unchanged from prior.   Electronically Signed   By: Salome Holmes M.D.   On: 10/02/2013 09:57   Dg Chest Port 1 View  10/10/2013   CLINICAL DATA:  Worsening shortness of breath and hypoxia.  EXAM: PORTABLE CHEST - 1 VIEW  COMPARISON:  Chest radiograph performed at earlier today at 10:34 a.m., and CTA of the chest performed earlier today at 11:15 a.m.  FINDINGS: There has been interval worsening in diffuse left-sided and right basilar airspace opacification, which may reflect opportunistic infection given the patient's history. No pleural effusion or pneumothorax is seen.  The cardiomediastinal silhouette is borderline normal in size. No acute osseous abnormalities are identified.  IMPRESSION: Interval worsening in diffuse left-sided and right basilar airspace opacification, which may reflect opportunistic infection given the patient's history.   Electronically Signed   By: Roanna Raider M.D.    On: 10/04/2013 21:21      Assessment/Plan:  Principal Problem:   Pneumonia Active Problems:   Acute respiratory failure   AIDS   PCP (pneumocystis carinii pneumonia)   CMV (cytomegalovirus)   Sacral decubitus ulcer, stage II   Hyponatremia   1)PCP PNA:  Continue with IV bactrim and high dose steroids. Currently on day #3 of 21 day course of therapy. Will follow up on BAL cultures and DFA stain for pcp  2) concurrent CAP: currently on day 3 of 7 with cefepime. Await culture results  3)  HIV/AIDS: currently onTivicay and Truvada to accomodate for intubation and mode of delivery of meds  4) oi proph: continue weekly prophhylactic azithromycin  5) blurry vision: he had low level viremia last month, concern that he may have element of cmv retinitis since he reports worsening vision and intermittent blurry vision. Please have ophtho evaluate tomorrow. We will  start IV ganciclovir empirically     LOS: 2 days   Kendrah Lovern 10/03/2013, 12:51 PM

## 2013-10-04 ENCOUNTER — Inpatient Hospital Stay (HOSPITAL_COMMUNITY): Payer: BC Managed Care – PPO

## 2013-10-04 LAB — GLUCOSE, CAPILLARY
GLUCOSE-CAPILLARY: 130 mg/dL — AB (ref 70–99)
Glucose-Capillary: 177 mg/dL — ABNORMAL HIGH (ref 70–99)
Glucose-Capillary: 169 mg/dL — ABNORMAL HIGH (ref 70–99)
Glucose-Capillary: 170 mg/dL — ABNORMAL HIGH (ref 70–99)
Glucose-Capillary: 150 mg/dL — ABNORMAL HIGH (ref 70–99)
Glucose-Capillary: 120 mg/dL — ABNORMAL HIGH (ref 70–99)

## 2013-10-04 LAB — CULTURE, RESPIRATORY W GRAM STAIN

## 2013-10-04 LAB — CBC
HCT: 24.4 % — ABNORMAL LOW (ref 39.0–52.0)
Hemoglobin: 8.3 g/dL — ABNORMAL LOW (ref 13.0–17.0)
MCH: 29.4 pg (ref 26.0–34.0)
MCHC: 34 g/dL (ref 30.0–36.0)
MCV: 86.5 fL (ref 78.0–100.0)
Platelets: 183 10*3/uL (ref 150–400)
RBC: 2.82 MIL/uL — ABNORMAL LOW (ref 4.22–5.81)
RDW: 15 % (ref 11.5–15.5)
WBC: 7.4 10*3/uL (ref 4.0–10.5)

## 2013-10-04 LAB — BASIC METABOLIC PANEL
BUN: 16 mg/dL (ref 6–23)
CO2: 26 meq/L (ref 19–32)
Calcium: 7.8 mg/dL — ABNORMAL LOW (ref 8.4–10.5)
Chloride: 96 mEq/L (ref 96–112)
Creatinine, Ser: 0.72 mg/dL (ref 0.50–1.35)
GFR calc Af Amer: >90 mL/min (ref >90)
GLUCOSE: 202 mg/dL — AB (ref 70–99)
POTASSIUM: 4.4 meq/L (ref 3.7–5.3)
Sodium: 132 mEq/L — ABNORMAL LOW (ref 137–147)

## 2013-10-04 LAB — CULTURE, RESPIRATORY: CULTURE: NO GROWTH

## 2013-10-04 MED ORDER — METHYLPREDNISOLONE SODIUM SUCC 40 MG IJ SOLR
40.0000 mg | Freq: Three times a day (TID) | INTRAMUSCULAR | Status: DC
  Administered 2013-10-04 – 2013-10-12 (×24): 40 mg via INTRAVENOUS
  Filled 2013-10-04 (×29): qty 1

## 2013-10-04 MED ORDER — CLONAZEPAM 0.5 MG PO TABS
0.5000 mg | ORAL_TABLET | Freq: Two times a day (BID) | ORAL | Status: DC
  Administered 2013-10-04 (×2): 0.5 mg via ORAL
  Filled 2013-10-04 (×2): qty 1

## 2013-10-04 MED ORDER — INSULIN ASPART 100 UNIT/ML ~~LOC~~ SOLN
0.0000 [IU] | SUBCUTANEOUS | Status: DC
  Administered 2013-10-04 – 2013-10-08 (×20): 2 [IU] via SUBCUTANEOUS
  Administered 2013-10-08 – 2013-10-09 (×4): 3 [IU] via SUBCUTANEOUS
  Administered 2013-10-09 (×2): 2 [IU] via SUBCUTANEOUS
  Administered 2013-10-09: 3 [IU] via SUBCUTANEOUS
  Administered 2013-10-09 – 2013-10-10 (×4): 2 [IU] via SUBCUTANEOUS
  Administered 2013-10-10: 3 [IU] via SUBCUTANEOUS
  Administered 2013-10-10: 2 [IU] via SUBCUTANEOUS
  Administered 2013-10-10: 3 [IU] via SUBCUTANEOUS
  Administered 2013-10-11 (×2): 2 [IU] via SUBCUTANEOUS
  Administered 2013-10-11: 3 [IU] via SUBCUTANEOUS
  Administered 2013-10-11 – 2013-10-12 (×4): 2 [IU] via SUBCUTANEOUS
  Administered 2013-10-12 (×2): 3 [IU] via SUBCUTANEOUS
  Administered 2013-10-12 – 2013-10-13 (×4): 2 [IU] via SUBCUTANEOUS
  Administered 2013-10-13: 3 [IU] via SUBCUTANEOUS
  Administered 2013-10-13: 2 [IU] via SUBCUTANEOUS
  Administered 2013-10-14: 3 [IU] via SUBCUTANEOUS
  Administered 2013-10-14 – 2013-10-15 (×4): 2 [IU] via SUBCUTANEOUS
  Administered 2013-10-15: 3 [IU] via SUBCUTANEOUS
  Administered 2013-10-16 – 2013-10-17 (×5): 2 [IU] via SUBCUTANEOUS
  Administered 2013-10-17: 3 [IU] via SUBCUTANEOUS
  Administered 2013-10-18 – 2013-10-19 (×4): 2 [IU] via SUBCUTANEOUS
  Administered 2013-10-19 (×3): 3 [IU] via SUBCUTANEOUS
  Administered 2013-10-19: 2 [IU] via SUBCUTANEOUS
  Administered 2013-10-20: 3 [IU] via SUBCUTANEOUS
  Administered 2013-10-20: 5 [IU] via SUBCUTANEOUS
  Administered 2013-10-20 – 2013-10-21 (×7): 3 [IU] via SUBCUTANEOUS
  Administered 2013-10-21: 5 [IU] via SUBCUTANEOUS
  Administered 2013-10-21: 3 [IU] via SUBCUTANEOUS
  Administered 2013-10-21: 5 [IU] via SUBCUTANEOUS
  Administered 2013-10-22: 3 [IU] via SUBCUTANEOUS
  Administered 2013-10-22 (×2): 5 [IU] via SUBCUTANEOUS
  Administered 2013-10-22 – 2013-10-23 (×5): 3 [IU] via SUBCUTANEOUS
  Administered 2013-10-23: 5 [IU] via SUBCUTANEOUS
  Administered 2013-10-23 – 2013-10-24 (×5): 3 [IU] via SUBCUTANEOUS
  Administered 2013-10-24: 2 [IU] via SUBCUTANEOUS
  Administered 2013-10-24 (×3): 3 [IU] via SUBCUTANEOUS
  Administered 2013-10-24: 2 [IU] via SUBCUTANEOUS
  Administered 2013-10-25 (×4): 3 [IU] via SUBCUTANEOUS
  Administered 2013-10-26 (×2): 2 [IU] via SUBCUTANEOUS
  Administered 2013-10-26: 5 [IU] via SUBCUTANEOUS
  Administered 2013-10-26: 2 [IU] via SUBCUTANEOUS
  Administered 2013-10-26 (×2): 3 [IU] via SUBCUTANEOUS
  Administered 2013-10-27: 2 [IU] via SUBCUTANEOUS
  Administered 2013-10-27 – 2013-10-28 (×5): 3 [IU] via SUBCUTANEOUS
  Administered 2013-10-28 (×4): 2 [IU] via SUBCUTANEOUS
  Administered 2013-10-28 – 2013-10-29 (×2): 3 [IU] via SUBCUTANEOUS
  Administered 2013-10-29 (×3): 2 [IU] via SUBCUTANEOUS
  Administered 2013-10-29 – 2013-10-30 (×2): 3 [IU] via SUBCUTANEOUS
  Administered 2013-10-30 – 2013-10-31 (×8): 2 [IU] via SUBCUTANEOUS
  Administered 2013-10-31: 3 [IU] via SUBCUTANEOUS
  Administered 2013-10-31: 2 [IU] via SUBCUTANEOUS
  Administered 2013-11-01 (×2): 3 [IU] via SUBCUTANEOUS
  Administered 2013-11-01 – 2013-11-02 (×5): 2 [IU] via SUBCUTANEOUS
  Administered 2013-11-02 (×2): 3 [IU] via SUBCUTANEOUS
  Administered 2013-11-02: 2 [IU] via SUBCUTANEOUS
  Administered 2013-11-02: 3 [IU] via SUBCUTANEOUS
  Administered 2013-11-03: 2 [IU] via SUBCUTANEOUS
  Administered 2013-11-03: 3 [IU] via SUBCUTANEOUS
  Administered 2013-11-03: 2 [IU] via SUBCUTANEOUS
  Administered 2013-11-03 (×3): 3 [IU] via SUBCUTANEOUS
  Administered 2013-11-04: 2 [IU] via SUBCUTANEOUS
  Administered 2013-11-04: 3 [IU] via SUBCUTANEOUS

## 2013-10-04 MED ORDER — PRO-STAT SUGAR FREE PO LIQD
60.0000 mL | Freq: Three times a day (TID) | ORAL | Status: DC
  Administered 2013-10-04 – 2013-10-07 (×10): 60 mL
  Filled 2013-10-04 (×12): qty 60

## 2013-10-04 MED ORDER — OXEPA PO LIQD
1000.0000 mL | ORAL | Status: DC
  Administered 2013-10-04 – 2013-10-06 (×3): 1000 mL
  Filled 2013-10-04 (×4): qty 1000

## 2013-10-04 MED ORDER — CHLORHEXIDINE GLUCONATE 0.12 % MT SOLN
15.0000 mL | Freq: Two times a day (BID) | OROMUCOSAL | Status: DC
  Administered 2013-10-04 – 2013-11-06 (×66): 15 mL via OROMUCOSAL
  Filled 2013-10-04 (×66): qty 15

## 2013-10-04 MED ORDER — BIOTENE DRY MOUTH MT LIQD
15.0000 mL | Freq: Four times a day (QID) | OROMUCOSAL | Status: DC
  Administered 2013-10-05 – 2013-11-06 (×129): 15 mL via OROMUCOSAL

## 2013-10-04 NOTE — Progress Notes (Signed)
[facility]  ID: 48 yo M newly diagnosed HIV, with likely recurrent PCP PNA +/-IRIS vs HCAP, also has low level; viremia CMV   Subjective: Remains intubated.vent setting adjustment due to hypoxia. cxr slightly worse today. Afebrile since initiation of steroids.   Medications: Abtx: #4cefepime, #4 bactrim, #2 ganciclovir  Scheduled Meds: . [START ON 10/05/2013] antiseptic oral rinse  15 mL Mouth Rinse QID  . aspirin  81 mg Oral Daily  . [START ON 10/06/2013] azithromycin  1,200 mg Oral Weekly  . ceFEPime (MAXIPIME) IV  1 g Intravenous 3 times per day  . chlorhexidine  15 mL Mouth Rinse BID  . clonazePAM  0.5 mg Oral BID  . docusate  100 mg Oral Daily  . dolutegravir  50 mg Oral Daily  . emtricitabine-tenofovir  1 tablet Oral Daily  . enoxaparin (LOVENOX) injection  40 mg Subcutaneous Q24H  . feeding supplement (OXEPA)  1,000 mL Per Tube Q24H  . feeding supplement (PRO-STAT SUGAR FREE 64)  60 mL Per Tube TID  . ganciclovir (CYTOVENE) IV  5 mg/kg Intravenous Q12H  . insulin aspart  0-15 Units Subcutaneous 6 times per day  . methylPREDNISolone (SOLU-MEDROL) injection  40 mg Intravenous 3 times per day  . pantoprazole sodium  40 mg Per Tube Daily  . sulfamethoxazole-trimethoprim  450 mg Intravenous Q8H   Continuous Infusions: . fentaNYL infusion INTRAVENOUS 300 mcg/hr (10/04/13 1647)  . propofol 60 mcg/kg/min (10/04/13 0953)     Objective: Weight change: 5 lb 8.2 oz (2.5 kg)  Intake/Output Summary (Last 24 hours) at 10/04/13 1656 Last data filed at 10/04/13 1510  Gross per 24 hour  Intake 4296.27 ml  Output   2920 ml  Net 1376.27 ml   Blood pressure 117/60, pulse 78, temperature 98.4 F (36.9 C), temperature source Other (Comment), resp. rate 14, height 5\' 10"  (1.778 m), weight 174 lb 2.6 oz (79 kg), SpO2 90.00%. Temp:  [97.5 F (36.4 C)-98.7 F (37.1 C)] 98.4 F (36.9 C) (04/07 1540) Pulse Rate:  [66-108] 78 (04/07 1500) Resp:  [11-23] 14  (04/07 1500) BP: (106-145)/(50-99) 117/60 mmHg (04/07 1500) SpO2:  [89 %-93 %] 90 % (04/07 1500) FiO2 (%):  [70 %] 70 % (04/07 1230) Weight:  [174 lb 2.6 oz (79 kg)] 174 lb 2.6 oz (79 kg) (04/07 0400)  Physical Exam: General: sleepy but arousable,  on ventilator  HEENT:  EOMI  CVS tachycardic ,  no murmur rubs or gallops Chest:rhonchi anteriorly Abdomen: soft nontender, nondistended, normal bowel sounds, Extremities: no  clubbing or edema noted bilaterally Neuro: nonfocal  CBC:   Recent Labs  10/03/13 0357 10/04/13 0500  WBC 6.0 7.4  HGB 9.5* 8.3*  HCT 28.3* 24.4*  NA 131* 132*  K 4.6 4.4  CL 98 96  CO2 23 26  BUN 18 16  CREATININE 0.75 0.72     BMET  Recent Labs  10/03/13 0357 10/04/13 0500  NA 131* 132*  K 4.6 4.4  CL 98 96  CO2 23 26  GLUCOSE 135* 202*  BUN 18 16  CREATININE 0.75 0.72  CALCIUM 8.2* 7.8*     Micro Results: Recent Results (from the past 240 hour(s))  CULTURE, BLOOD (ROUTINE X 2)     Status: None   Collection Time    10/14/13 10:20 AM      Result Value Ref Range Status   Specimen Description BLOOD LEFT ANTECUBITAL   Final   Special Requests BOTTLES DRAWN AEROBIC AND  ANAEROBIC 5 CC EACH   Final   Culture  Setup Time     Final   Value: 11/17/2013 14:52     Performed at [facility]   Culture     Final   Value:        BLOOD CULTURE RECEIVED NO GROWTH TO DATE CULTURE WILL BE HELD FOR 5 DAYS BEFORE ISSUING A FINAL NEGATIVE REPORT     Performed at [facility]   Report Status PENDING   Incomplete  CULTURE, BLOOD (ROUTINE X 2)     Status: None   Collection Time    05-Jan-2014 10:35 AM      Result Value Ref Range Status   Specimen Description BLOOD RIGHT ANTECUBITAL   Final   Special Requests BOTTLES DRAWN AEROBIC AND ANAEROBIC 5CC EACH   Final   Culture  Setup Time     Final   Value: 11/01/2013 14:52     Performed at [facility]   Culture     Final   Value:        BLOOD CULTURE RECEIVED NO GROWTH TO DATE  CULTURE WILL BE HELD FOR 5 DAYS BEFORE ISSUING A FINAL NEGATIVE REPORT     Performed at [facility]   Report Status PENDING   Incomplete  PNEUMOCYSTIS JIROVECI SMEAR BY DFA     Status: None   Collection Time    10/02/13 10:08 AM      Result Value Ref Range Status   Specimen Source-[facility] BRONCHIAL ALVEOLAR LAVAGE   Corrected   Comment: CORRECTED ON 04/05 AT 1024: PREVIOUSLY REPORTED AS BRONCHIAL WASHINGS   Pneumocystis jiroveci Ag NEGATIVE   Final   Comment: [facility]  CULTURE, RESPIRATORY (NON-EXPECTORATED)     Status: None   Collection Time    10/02/13 10:08 AM      Result Value Ref Range Status   Specimen Description BRONCHIAL ALVEOLAR LAVAGE   Final   Special Requests Immunocompromised   Final   Gram Stain     Final   Value: RARE WBC PRESENT, PREDOMINANTLY PMN     RARE SQUAMOUS EPITHELIAL CELLS PRESENT     NO ORGANISMS SEEN     Performed at [facility]   Culture     Final   Value: NO GROWTH 2 DAYS     Performed at [facility]   Report Status 10/04/2013 FINAL   Final  FUNGUS CULTURE W SMEAR     Status: None   Collection Time    10/02/13 10:08 AM      Result Value Ref Range Status   Specimen Description BRONCHIAL ALVEOLAR LAVAGE   Final   Special Requests Immunocompromised   Final   Fungal Smear     Final   Value: NO YEAST OR FUNGAL ELEMENTS SEEN     Performed at [facility]   Culture     Final   Value: CULTURE IN PROGRESS FOR FOUR WEEKS     Performed at [facility]   Report Status PENDING   Incomplete  AFB CULTURE WITH SMEAR     Status: None   Collection Time    10/02/13 10:08 AM      Result Value Ref Range Status   Specimen Description BRONCHIAL ALVEOLAR LAVAGE   Final   Special Requests Immunocompromised   Final   ACID FAST SMEAR     Final   Value: NO ACID FAST BACILLI SEEN  Performed at [facility]     Final   Value: CULTURE WILL BE EXAMINED FOR 6 WEEKS BEFORE  ISSUING A FINAL REPORT     Performed at [facility]   Report Status PENDING   Incomplete  CMV CULTURE CMVC     Status: None   Collection Time    10/02/13 10:08 AM      Result Value Ref Range Status   Specimen Description BRONCHIAL ALVEOLAR LAVAGE   Final   Special Requests Immunocompromised   Final   Culture     Final   Value: Culture has been initiated.     Performed at [facility]   Report Status PENDING   Incomplete    Studies/Results: Dg Chest Port 1 View  10/04/2013   CLINICAL DATA:  Pneumonia.  EXAM: PORTABLE CHEST - 1 VIEW  COMPARISON:  DG CHEST 1V PORT dated 10/03/2013; DG CHEST 1V PORT dated 18-Oct-2013; DG CHEST 2 VIEW dated 09/13/2013  FINDINGS: The endotracheal tube terminates 3.4 cm above the carina. A right IJ line is stable in position. The NG tube courses off the inferior border the film. The side port is in the stomach. The heart is enlarged. Diffuse interstitial and airspace disease is progressively worsening.  IMPRESSION: 1. Progressive diffuse bilateral interstitial and airspace disease. While this may represent progressive pneumonia, ARDS is also considered. 2. Support apparatus is stable and in satisfactory position.   Electronically Signed   By: Gennette Pac M.D.   On: 10/04/2013 07:21   Dg Chest Port 1 View  10/03/2013   CLINICAL DATA:  Hypoxia  EXAM: PORTABLE CHEST - 1 VIEW  COMPARISON:  October 02, 2013  FINDINGS: Endotracheal tube tip is 3.5 cm above the carina. Central catheter tip is in the superior vena cava. Nasogastric tube tip and side port are below the diaphragm. No pneumothorax.  There is extensive interstitial and alveolar edema, more on the left on the right. There is consolidation in both lower lobes, stable on the left and slightly increased on the right. Heart is enlarged. Pulmonary vascularity shows pulmonary venous hypertension. No adenopathy appreciable.  IMPRESSION: Tube and catheter positions as described without pneumothorax. Underlying  congestive heart failure. Increase consolidation in the right base may represent increase in edema or possibly superimposed pneumonia.   Electronically Signed   By: Bretta Bang M.D.   On: 10/03/2013 07:06      Assessment/Plan:  Principal Problem:   Pneumonia Active Problems:   Acute respiratory failure   AIDS   PCP (pneumocystis carinii pneumonia)   CMV (cytomegalovirus)   Sacral decubitus ulcer, stage II   Hyponatremia   1)PCP PNA:  Continue with IV bactrim and high dose steroids. Currently on day #4 of 21 day course of therapy. DFA stains are negative. Will check fungitell assay. occ do see discordance of isolation of fungitell+ with negative DFA from sampling  2) concurrent CAP: currently on day 4 of 7 with cefepime. Thus far cx remain negative  3)  HIV/AIDS: currently onTivicay and Truvada to accomodate for intubation and mode of delivery of meds  4) oi proph: continue weekly prophhylactic azithromycin  5) cmv viremia = concern that is worsening in setting of IRIS and possibly contributing to blurry vision: discussed with jason sanders that he is on ganciclovir and clinical history. It doesn't appear consistent with cmv will continue to follow. Will consider to do bedside dilated exam. Continue with ganciclovir for now. If clinically worsens, Dr.  Sanders to come to do inpatient eval.  6) ARDS likely due to immune reactivation. Continue with pulm management of vents and steroids for now   Spent with greater than 50% discussion with family and coordination of care   LOS: 3 days   William Bender 10/04/2013, 4:56 PM

## 2013-10-04 NOTE — Progress Notes (Signed)
ANTIBIOTIC CONSULT NOTE - Follow Up  Pharmacy Consult for Cefepime, Septra IV Indication: Pneumonia (PCP), Sepsis  Allergies  Allergen Reactions  . Penicillins Rash    Patient Measurements: Height: 5\' 10"  (177.8 cm) Weight: 174 lb 2.6 oz (79 kg) IBW/kg (Calculated) : 73  Vital Signs: Temp: 98.6 F (37 C) (04/07 0800) Temp src: Oral (04/07 0800) BP: 131/65 mmHg (04/07 0800) Pulse Rate: 88 (04/07 0800)  Labs:  Recent Labs  10/02/13 0326 10/03/13 0357 10/04/13 0500  WBC 4.7 6.0 7.4  HGB 8.9* 9.5* 8.3*  PLT 178 185 183  CREATININE 0.94 0.75 0.72   Estimated Creatinine Clearance: 117.9 ml/min (by C-G formula based on Cr of 0.72).  Medical History: Past Medical History  Diagnosis Date  . Hypertension   . Hyperlipidemia   . Chronic headaches   . HIV (human immunodeficiency virus infection)    Assessment: William Bender admitted 4/4 with tachycardia and SOB, concerning for ongoing/recurrent PCP pneumonia.  PMH includes recent hospitalization (3/17- 09/20/13) with newly diagnosed HIV, started on ART (Stribild), and treatment for PCP pneumonia.  At discharge, he was taking Bactrim, but prematurely decreased his dosage.  Pharmacy is consulted to dose Cefepime and Bactrim.  ID consult recommends repeating an entire new 21 days course for PCP PNA with day #1 being first day of this admission.    4/4 >> Vanc >> 4/4 4/4 >> Cefepime >>  4/4 >> Bactrim IV >> 4/6 >> ganciclovir >>  Tmax: afebrile now WBCs: wnl Renal: Stable. SCr 0.72, CrCl >100 ml/min CD4: 30 (3/31) HIV RNA quant: 13,616 (3/31)  4/4 blood x2: ngtd 4/5 PCP by DFA: negative 4/5 AFB: pending, in progress for 6 weeks 4/5 Resp: no growth 1 day 4/5 Fungal: pending, in progress for 4 weeks 4/5 CMV cx: pending 4/4 Histoplasma ag: in process  Day #4/x Cefepime 1g IV q8h and Septra #4/21 450 mg IV q8h (~17 mg/kg/day) for PCP PNA +/- HCAP.  ID added ganciclovir yesterday for CMV retinitis induction therapy since patient  has been c/o blurry vision.  Continuing Zithromax for MAC prophylaxis.  Pt was initially started on Stribild but changed to dolutegravir / Truvada since Stribild can inhibit fentanyl metabolism (pt on fentanyl infusion).  Meds can be crushed to be administered.  Goal of Therapy:  Appropriate abx dosing, eradication of infection.  Plan:  1.  No dose adjustments for abx. Continue Cefepime 1g IV q8h and Septra 450 mg IV q8h.  2.  Continue to monitor SCr, K+ (while on Septra). 3.  F/u results of ID work-up and recommendations.  Clance BollAmanda Mahalie Kanner, PharmD, BCPS Pager: [phone] 10/04/2013 9:46 AM

## 2013-10-04 NOTE — Progress Notes (Signed)
NUTRITION FOLLOW UP  Intervention:   - Change TF to Oxepa at 59m/hr via OGT with Prosat 689mTID. This will provide 1320 calories, 120g protein, and 37766mree water. TF plus current calories from Propofol will provide a total of 2046 calories and meet 104% estimated calorie needs and 100% estimated protein needs. If IVF d/c, recommend 240m84mter flushes 8 times/day.  - Will continue to monitor   Nutrition Dx:   Inadequate oral intake related to inability to eat as evidenced by NPO - ongoing   Goal:   TF regimen to meet >/= 90% of their estimated nutrition needs - met   Monitor:   Weights, labs, TF tolerance, vent status   Assessment:   47 y86 male recently diagnosed with HIV and treated empirically for PCP [facility] hypoxemic respiratory failure.   4/5 - Pt intubated 4/5. Per family, pt has lost ~15-20 lbs over the past few weeks. He is using nutritional supplements at home (Boost or Ensure.). Received consult for TF management, RD ordered Vital AF 1.2 @ 20 ml/hr via OG tube and increase by 10 ml every 4 hours to goal rate of 80 ml/hr. At goal rate, tube feeding regimen will provide 2304 kcal, 103 grams of protein, and 1549 ml of H2O. Goal rate meets 100% of estimated calorie and protein needs.   4/7 - Pt started on ARDS protocol 4/6. Per RN, pt tolerating Vital AF 1.2 at goal rate of 80ml37mwith 30ml 18mesiduals this morning and 30-40ml r53muals overnight. Remains intubated.   Patient is currently intubated on ventilator support MV: 14.2 L/min Temp (24hrs), Avg:98.5 F (36.9 C), Min:97.5 F (36.4 C), Max:99 F (37.2 C)  Propofol: 27.5 ml/hr provides 726 calories   TF order: Vital AF 1.2 at 80ml/hr79m OGT - provides 2304 kcal, 103 grams of protein, and 1549 ml of H2O  Height: Ht Readings from Last 1 Encounters:  10/25/2013 5' 10"  (1.778 m)    Weight Status:   Wt Readings from Last 1 Encounters:  10/04/13 174 lb 2.6 oz (79 kg)  Admit wt:         155 lb (70.3 kg) Net I/Os: +6.4L  Re-estimated needs:  Kcal: 1964 Protein: 105-125g Fluid: >1.9L/day  Skin: Generalized edema, stage 2 sacral pressure ulcer and on right lateral side   Diet Order: NPO   Intake/Output Summary (Last 24 hours) at 10/04/13 0939 Last data filed at 10/04/13 0804  Gross per 24 hour  Intake 4977.58 ml  Output   2800 ml  Net 2177.58 ml    Last BM: 4/3   Labs:   Recent Labs Lab 10/02/13 0326 10/03/13 0357 10/04/13 0500  NA 134* 131* 132*  K 4.4 4.6 4.4  CL 102 98 96  CO2 24 23 26   BUN 12 18 16   CREATININE 0.94 0.75 0.72  CALCIUM 8.0* 8.2* 7.8*  GLUCOSE 134* 135* 202*    CBG (last 3)   Recent Labs  10/03/13 2320 10/04/13 0322 10/04/13 0748  GLUCAP 177* 169* 170*    Scheduled Meds: . aspirin  81 mg Oral Daily  . [START ON 10/06/2013] azithromycin  1,200 mg Oral Weekly  . ceFEPime (MAXIPIME) IV  1 g Intravenous 3 times per day  . chlorhexidine  15 mL Mouth Rinse BID  . docusate  100 mg Oral Daily  . dolutegravir  50 mg Oral Daily  . emtricitabine-tenofovir  1 tablet Oral Daily  . enoxaparin (LOVENOX) injection  40 mg Subcutaneous Q24H  . ganciclovir (CYTOVENE) IV  5 mg/kg Intravenous Q12H  . insulin aspart  0-15 Units Subcutaneous 6 times per day  . methylPREDNISolone (SOLU-MEDROL) injection  125 mg Intravenous BID  . pantoprazole sodium  40 mg Per Tube Daily  . sulfamethoxazole-trimethoprim  450 mg Intravenous Q8H    Continuous Infusions: . feeding supplement (VITAL AF 1.2 CAL) 70 mL (10/03/13 1413)  . fentaNYL infusion INTRAVENOUS 300 mcg/hr (10/04/13 0800)  . propofol 30 mcg/kg/min (10/04/13 0804)     Mikey College MS, RD, [place] Pager [phone] After Hours Pager

## 2013-10-04 NOTE — Progress Notes (Signed)
PULMONARY / CRITICAL CARE MEDICINE   Name: William Bender MRN: [id number] DOB: 12/21/1965    ADMISSION DATE:  10/21/2013 CONSULTATION DATE:  10/24/2013  REFERRING MD :  Rancour PRIMARY SERVICE: PCCM  CHIEF COMPLAINT:  Shortness of breath, fever, cough  BRIEF PATIENT DESCRIPTION: 48 y/o male recently diagnosed with HIV/AIDS -CD4 =30 on 3/31 and treated empirically for PCP [facility] with acute hypoxemic respiratory failure. Note that sputum PCP was neg 3/18  SIGNIFICANT EVENTS / STUDIES:  4/4 CT angio > no PE, diffuse bilateral GGO and patchy consolidation worse in the bases L > R  LINES / TUBES: 4/5 ETT >> 4/5 R IJ CVL >>  CULTURES: 4/4 blood >> 4/4 urine hist >> 4/4 CMV pcr >> POS 4/4 PCP DFA induced sputum >> 4/5 PCP DFA BAL LUL >>neg  4/5 resp culture bal >>ng 4/5 fungal  bal >>neg 4/5 afb  bal >>neg 4/5 cmv bal >>  ANTIBIOTICS: Per ID 4/4 Bactrim >> 4/4 Vanc >>  4/4 Cefepime >>  SUBJECTIVE:  Sedated afebrile Remains on 70%/ +10  VITAL SIGNS: Temp:  [97.5 F (36.4 C)-99 F (37.2 C)] 98.6 F (37 C) (04/07 0800) Pulse Rate:  [63-104] 88 (04/07 0800) Resp:  [11-24] 21 (04/07 0800) BP: (105-149)/(50-99) 131/65 mmHg (04/07 0800) SpO2:  [88 %-93 %] 92 % (04/07 0800) FiO2 (%):  [60 %-70 %] 70 % (04/07 0800) Weight:  [79 kg (174 lb 2.6 oz)] 79 kg (174 lb 2.6 oz) (04/07 0400) HEMODYNAMICS:   VENTILATOR SETTINGS: Vent Mode:  [-] PRVC FiO2 (%):  [60 %-70 %] 70 % Set Rate:  [22 bmp] 22 bmp Vt Set:  [440 mL] 440 mL PEEP:  [10 cmH20] 10 cmH20 Plateau Pressure:  [18 cmH20-28 cmH20] 22 cmH20 INTAKE / OUTPUT: Intake/Output     04/06 0701 - 04/07 0700 04/07 0701 - 04/08 0700   I.V. (mL/kg) 1295.9 (16.4) 77.3 (1)   Other     NG/GT 1980 80   IV Piggyback 1684.4 100   Total Intake(mL/kg) 4960.3 (62.8) 257.3 (3.3)   Urine (mL/kg/hr) 2850 (1.5) 100 (0.9)   Total Output 2850 100   Net +2110.3 +157.3          PHYSICAL EXAMINATION:  Gen: sedated  on vent, acutely ill, does wake up and follow commands HEENT: NCAT,  EOMi, OP clear PULM: decreased bs bases CV: tachy, regular, no mgr, no JVD AB: BS+, soft, nontender, no hsm Neuro  -sedated but arouses and maex4 Ext: warm, no edema, no clubbing, no cyanosis Derm: diaphoretic, flushed   LABS:  CBC  Recent Labs Lab 10/02/13 0326 10/03/13 0357 10/04/13 0500  WBC 4.7 6.0 7.4  HGB 8.9* 9.5* 8.3*  HCT 25.9* 28.3* 24.4*  PLT 178 185 183   Coag's No results found for this basename: APTT, INR,  in the last 168 hours BMET  Recent Labs Lab 10/02/13 0326 10/03/13 0357 10/04/13 0500  NA 134* 131* 132*  K 4.4 4.6 4.4  CL 102 98 96  CO2 24 23 26   BUN 12 18 16   CREATININE 0.94 0.75 0.72  GLUCOSE 134* 135* 202*   Electrolytes  Recent Labs Lab 10/02/13 0326 10/03/13 0357 10/04/13 0500  CALCIUM 8.0* 8.2* 7.8*   Sepsis Markers  Recent Labs Lab 10/25/2013 1019  LATICACIDVEN 1.56   ABG  Recent Labs Lab 10/23/2013 2040 10/02/13 1152 10/03/13 0600  PHART 7.437 7.378 7.332*  PCO2ART 31.1* 40.5 42.9  PO2ART 121.0* 187.0* 78.7*  Liver Enzymes  Recent Labs Lab 09/27/13 1114 09/30/2013 1005  AST 33 43*  ALT 40 47  ALKPHOS 46 53  BILITOT 0.3 0.2*  ALBUMIN 3.1* 2.5*   Cardiac Enzymes  Recent Labs Lab 10/17/2013 1005  TROPONINI <0.30  PROBNP 460.5*   Glucose  Recent Labs Lab 10/02/13 2016 10/03/13 0735 10/03/13 1157 10/03/13 1609 10/03/13 1949 10/03/13 2320  GLUCAP 152* 150* 164* 171* 155* 177*    LDH 395  Imaging  Dg Abd 1 View  10/02/2013   CLINICAL DATA:  NG tube placement  EXAM: ABDOMEN - 1 VIEW  COMPARISON:  None.  FINDINGS: NG tube within the proximal to mid stomach. Mild gaseous distention of the stomach. Lower abdominal small bowel distention noted but there is air in the colon. No acute osseous finding demonstrated.  IMPRESSION: NG tube within the stomach.   Electronically Signed   By: Ruel Favors M.D.   On: 10/02/2013 11:11   Dg Chest  Port 1 View  10/04/2013   CLINICAL DATA:  Pneumonia.  EXAM: PORTABLE CHEST - 1 VIEW  COMPARISON:  DG CHEST 1V PORT dated 10/03/2013; DG CHEST 1V PORT dated 10/27/2013; DG CHEST 2 VIEW dated 09/13/2013  FINDINGS: The endotracheal tube terminates 3.4 cm above the carina. A right IJ line is stable in position. The NG tube courses off the inferior border the film. The side port is in the stomach. The heart is enlarged. Diffuse interstitial and airspace disease is progressively worsening.  IMPRESSION: 1. Progressive diffuse bilateral interstitial and airspace disease. While this may represent progressive pneumonia, ARDS is also considered. 2. Support apparatus is stable and in satisfactory position.   Electronically Signed   By: Gennette Pac M.D.   On: 10/04/2013 07:21   Dg Chest Port 1 View  10/03/2013   CLINICAL DATA:  Hypoxia  EXAM: PORTABLE CHEST - 1 VIEW  COMPARISON:  October 02, 2013  FINDINGS: Endotracheal tube tip is 3.5 cm above the carina. Central catheter tip is in the superior vena cava. Nasogastric tube tip and side port are below the diaphragm. No pneumothorax.  There is extensive interstitial and alveolar edema, more on the left on the right. There is consolidation in both lower lobes, stable on the left and slightly increased on the right. Heart is enlarged. Pulmonary vascularity shows pulmonary venous hypertension. No adenopathy appreciable.  IMPRESSION: Tube and catheter positions as described without pneumothorax. Underlying congestive heart failure. Increase consolidation in the right base may represent increase in edema or possibly superimposed pneumonia.   Electronically Signed   By: Bretta Bang M.D.   On: 10/03/2013 07:06   Dg Chest Port 1 View  10/02/2013   CLINICAL DATA:  central line placement  EXAM: PORTABLE CHEST - 1 VIEW  COMPARISON:  DG CHEST 1V PORT dated 10/05/2013  FINDINGS: Endotracheal tube has been inserted tip 4.4 cm above the carina. A right internal jugular catheter is appreciated  tip at the level superior vena cava. Stable bilateral pulmonary opacities are appreciated right lung base, and throughout the left hemithorax. No new focal regions of consolidation no evidence of pneumothorax. Degenerative changes within the right lower shoulders.  IMPRESSION: Support lines and tubes adequately positioned  Bilateral pneumonitis unchanged from prior.   Electronically Signed   By: Salome Holmes M.D.   On: 10/02/2013 09:57    ASSESSMENT / PLAN:  PULMONARY A: Acute hypoxemic respiratory failure : This is most likely an acute infectious process PCP/ Immune reconstitution vs CMV pneumonitis P:   -  ct PEEP 10cm, TVol 6cc/kg -  ARDS protocol for PEEP/FIO2   INFECTIOUS A:   Acute 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> ddx broad: PCP, HCAP, fungal, less likely AFB or viral (CMV) Stage II buttock ulcers P:   - Treat HCAP and PCP with Bactrim and broad-spectrum antibiotics --Await BAL results - Continue antiviral therapy for HIV - Continue MAC prophylaxis - wound care - appreciate ID input - on gancyclovir  CARDIOVASCULAR A: Sepsis without shock Baseline hypertension P:  - Continue IV fluids - dc clonidine  RENAL  Recent Labs Lab 10/02/13 0326 10/03/13 0357 10/04/13 0500  NA 134* 131* 132*    A:  Hyponatremia> 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), worse P:   - Monitor sodium  GASTROINTESTINAL A:  No acute issues P:   - Continue home pantoprazole via tube -ct tube feedings -bowel regimen  HEMATOLOGIC A:  No acute issues P:  - Monitor CBC  ENDOCRINE A:   No acute issues P:   - monitor glucose on prednisone and tube feeds  NEUROLOGIC A:   Anxiety/ Pain P:   -versed int/ fent gtt -ct propofol gtt until 4/9 then transition to precedex if still requiring -Add klonopin    CODE STATUS: Full   Summary -  Course with CXR & hypoxia improvement then worsening more indicative of IRIS rather than CMV pneumonitis   Brett Canales Minor ACNP Ad[facility] Pollack PCCM Pager [phone]  till 3 pm If no answer page [phone]  Care during the described time interval was provided by me and/or other providers on the critical care team.  I have reviewed this patient's available data, including medical history, events of note, physical examination and test results as part of my evaluation  CC time x 57m  ALVA,RAKESH V.  10/04/2013, 8:35 AM

## 2013-10-05 ENCOUNTER — Telehealth: Payer: Self-pay | Admitting: *Deleted

## 2013-10-05 ENCOUNTER — Inpatient Hospital Stay (HOSPITAL_COMMUNITY): Payer: BC Managed Care – PPO

## 2013-10-05 DIAGNOSIS — J9589 Other postprocedural complications and disorders of respiratory system, not elsewhere classified: Secondary | ICD-10-CM

## 2013-10-05 LAB — BLOOD GAS, ARTERIAL
ACID-BASE EXCESS: 3 mmol/L — AB (ref 0.0–2.0)
BICARBONATE: 28.2 meq/L — AB (ref 20.0–24.0)
Drawn by: 11249
FIO2: 0.8 %
MECHVT: 440 mL
O2 Saturation: 95.4 %
PEEP: 14 cmH2O
Patient temperature: 99.6
RATE: 22 resp/min
TCO2: 26.9 mmol/L (ref 0–100)
pCO2 arterial: 51.2 mmHg — ABNORMAL HIGH (ref 35.0–45.0)
pH, Arterial: 7.363 (ref 7.350–7.450)
pO2, Arterial: 101 mmHg — ABNORMAL HIGH (ref 80.0–100.0)

## 2013-10-05 LAB — CBC
HCT: 24.9 % — ABNORMAL LOW (ref 39.0–52.0)
Hemoglobin: 8.5 g/dL — ABNORMAL LOW (ref 13.0–17.0)
MCH: 29.8 pg (ref 26.0–34.0)
MCHC: 34.1 g/dL (ref 30.0–36.0)
MCV: 87.4 fL (ref 78.0–100.0)
PLATELETS: 175 10*3/uL (ref 150–400)
RBC: 2.85 MIL/uL — AB (ref 4.22–5.81)
RDW: 15 % (ref 11.5–15.5)
WBC: 6 10*3/uL (ref 4.0–10.5)

## 2013-10-05 LAB — CMV CULTURE CMVC

## 2013-10-05 LAB — GLUCOSE, CAPILLARY
GLUCOSE-CAPILLARY: 135 mg/dL — AB (ref 70–99)
GLUCOSE-CAPILLARY: 149 mg/dL — AB (ref 70–99)
Glucose-Capillary: 123 mg/dL — ABNORMAL HIGH (ref 70–99)
Glucose-Capillary: 130 mg/dL — ABNORMAL HIGH (ref 70–99)
Glucose-Capillary: 116 mg/dL — ABNORMAL HIGH (ref 70–99)
Glucose-Capillary: 142 mg/dL — ABNORMAL HIGH (ref 70–99)
Glucose-Capillary: 138 mg/dL — ABNORMAL HIGH (ref 70–99)
Glucose-Capillary: 116 mg/dL — ABNORMAL HIGH (ref 70–99)

## 2013-10-05 LAB — BASIC METABOLIC PANEL
BUN: 20 mg/dL (ref 6–23)
CO2: 28 meq/L (ref 19–32)
Calcium: 7.8 mg/dL — ABNORMAL LOW (ref 8.4–10.5)
Chloride: 93 mEq/L — ABNORMAL LOW (ref 96–112)
Creatinine, Ser: 0.7 mg/dL (ref 0.50–1.35)
GFR calc Af Amer: >90 mL/min (ref >90)
GLUCOSE: 151 mg/dL — AB (ref 70–99)
POTASSIUM: 4.5 meq/L (ref 3.7–5.3)
SODIUM: 132 meq/L — AB (ref 137–147)

## 2013-10-05 LAB — MAGNESIUM: MAGNESIUM: 2 mg/dL (ref 1.5–2.5)

## 2013-10-05 LAB — HISTOPLASMA ANTIGEN, URINE: Histoplasma Antigen, urine: <0.5 ng/mL

## 2013-10-05 LAB — PHOSPHORUS: Phosphorus: 3.1 mg/dL (ref 2.3–4.6)

## 2013-10-05 MED ORDER — LACTULOSE 10 GM/15ML PO SOLN
30.0000 g | Freq: Once | ORAL | Status: AC
  Administered 2013-10-06: 30 g
  Filled 2013-10-05: qty 45

## 2013-10-05 MED ORDER — CLONAZEPAM 1 MG PO TABS
1.0000 mg | ORAL_TABLET | Freq: Two times a day (BID) | ORAL | Status: DC
  Administered 2013-10-05 – 2013-10-08 (×8): 1 mg via ORAL
  Filled 2013-10-05 (×8): qty 1

## 2013-10-05 NOTE — Progress Notes (Signed)
PULMONARY / CRITICAL CARE MEDICINE   Name: William Bender MRN: [id number] DOB: [birth year]    ADMISSION DATE:  10/03/2013 CONSULTATION DATE:  10/26/2013  REFERRING MD :  [facility]our PRIMARY SERVICE: PCCM  CHIEF COMPLAINT:  Shortness of breath, fever, cough  BRIEF PATIENT DESCRIPTION: 48 y/o male recently diagnosed with HIV/AIDS -CD4 =30 on 3/31 and treated empirically for PCP [facility] 4/4 with acute hypoxemic respiratory failure. Note that sputum PCP was neg 3/18  SIGNIFICANT EVENTS / STUDIES:  4/4 CT angio > no PE, diffuse bilateral GGO and patchy consolidation worse in the bases L > R  LINES / TUBES: 4/5 ETT >> 4/5 R IJ CVL >>  CULTURES: 4/4 blood >> 4/4 urine hist >> 4/4 CMV pcr >> POS 4/4 PCP DFA induced sputum >> 4/5 PCP DFA BAL LUL >>neg  4/5 resp culture bal >>ng 4/5 fungal  bal >>neg 4/5 afb  bal >>neg 4/5 cmv bal >>  ANTIBIOTICS: Per ID 4/4 Bactrim >> 4/4 Vanc >>  4/4 Cefepime >>  SUBJECTIVE:  Sedated Afebrile on steroids Increased to 80%/ +14  VITAL SIGNS: Temp:  [98.2 F (36.8 C)-99.7 F (37.6 C)] 98.6 F (37 C) (04/08 0400) Pulse Rate:  [75-108] 87 (04/08 0800) Resp:  [13-23] 16 (04/08 0800) BP: (115-141)/(55-67) 116/66 mmHg (04/08 0800) SpO2:  [89 %-96 %] 95 % (04/08 0800) FiO2 (%):  [70 %-80 %] 80 % (04/08 0800) Weight:  [81.1 kg (178 lb 12.7 oz)] 81.1 kg (178 lb 12.7 oz) (04/08 0419) HEMODYNAMICS:   VENTILATOR SETTINGS: Vent Mode:  [-] PRVC FiO2 (%):  [70 %-80 %] 80 % Set Rate:  [22 bmp] 22 bmp Vt Set:  [440 mL] 440 mL PEEP:  [10 cmH20-14 cmH20] 14 cmH20 Plateau Pressure:  [20 cmH20-30 cmH20] 30 cmH20 INTAKE / OUTPUT: Intake/Output     04/07 0701 - 04/08 0700 04/08 0701 - 04/09 0700   I.V. (mL/kg) 1435.6 (17.7) 160.4 (2)   Other 20    NG/GT 760 40   IV Piggyback 1410    Total Intake(mL/kg) 3625.6 (44.7) 200.4 (2.5)   Urine (mL/kg/hr) 2820 (1.4) 400 (1.9)   Total Output 2820 400   Net +805.6 -199.6          PHYSICAL  EXAMINATION:  Gen: sedated on vent, acutely ill, does wake up and follow commands HEENT: NCAT,  EOMi, OP clear PULM: decreased bs bases CV: tachy, regular, no mgr, no JVD AB: BS+, soft, nontender, no hsm Neuro  -sedated but arouses and maex4 Ext: warm, no edema, no clubbing, no cyanosis Derm:no rash   LABS:  CBC  Recent Labs Lab 10/03/13 0357 10/04/13 0500 10/05/13 0545  WBC 6.0 7.4 6.0  HGB 9.5* 8.3* 8.5*  HCT 28.3* 24.4* 24.9*  PLT 185 183 175   Coag's No results found for this basename: APTT, INR,  in the last 168 hours BMET  Recent Labs Lab 10/03/13 0357 10/04/13 0500 10/05/13 0545  NA 131* 132* 132*  K 4.6 4.4 4.5  CL 98 96 93*  CO2 23 26 28   BUN 18 16 20   CREATININE 0.75 0.72 0.70  GLUCOSE 135* 202* 151*   Electrolytes  Recent Labs Lab 10/03/13 0357 10/04/13 0500 10/05/13 0545  CALCIUM 8.2* 7.8* 7.8*  MG  --   --  2.0  PHOS  --   --  3.1   Sepsis Markers  Recent Labs Lab 10/17/2013 1019  LATICACIDVEN 1.56   ABG  Recent Labs Lab 10/02/13 1152 10/03/13  0600 10/05/13 0552  PHART 7.378 7.332* 7.363  PCO2ART 40.5 42.9 51.2*  PO2ART 187.0* 78.7* 101.0*   Liver Enzymes  Recent Labs Lab 09/30/2013 1005  AST 43*  ALT 47  ALKPHOS 53  BILITOT 0.2*  ALBUMIN 2.5*   Cardiac Enzymes  Recent Labs Lab 09/30/2013 1005  TROPONINI <0.30  PROBNP 460.5*   Glucose  Recent Labs Lab 10/04/13 1307 10/04/13 1650 10/04/13 1913 10/04/13 2342 10/05/13 0403 10/05/13 0807  GLUCAP 150* 130* 120* 130* 123* 116*    LDH 395  Imaging  Dg Chest Port 1 View  10/05/2013   CLINICAL DATA:  Endotracheal tube position.  EXAM: PORTABLE CHEST - 1 VIEW  COMPARISON:  DG CHEST 1V PORT dated 10/04/2013; CT ANGIO CHEST W/CM &/OR WO/CM dated 10/13/2013; DG CHEST 1V PORT dated 10/03/2013; DG CHEST 1V PORT dated 10/08/2013; DG CHEST 2 VIEW dated 09/13/2013  FINDINGS: Endotracheal tube terminates 3.4 cm above the carina. Nasogastric tube is followed into the stomach with  the side port just beyond the gastroesophageal junction. Right IJ central line tip projects over the SVC, stable. Heart size is stable. There is fairly severe diffuse bilateral airspace disease, similar to the prior exam. No definite pleural fluid. No pneumothorax. Degenerative changes are seen in the shoulders.  IMPRESSION: Fairly severe diffuse bilateral airspace disease, stable and indicative of pneumonia.   Electronically Signed   By: Leanna Battles M.D.   On: 10/05/2013 07:12   Dg Chest Port 1 View  10/04/2013   CLINICAL DATA:  Pneumonia.  EXAM: PORTABLE CHEST - 1 VIEW  COMPARISON:  DG CHEST 1V PORT dated 10/03/2013; DG CHEST 1V PORT dated 09/30/2013; DG CHEST 2 VIEW dated 09/13/2013  FINDINGS: The endotracheal tube terminates 3.4 cm above the carina. A right IJ line is stable in position. The NG tube courses off the inferior border the film. The side port is in the stomach. The heart is enlarged. Diffuse interstitial and airspace disease is progressively worsening.  IMPRESSION: 1. Progressive diffuse bilateral interstitial and airspace disease. While this may represent progressive pneumonia, ARDS is also considered. 2. Support apparatus is stable and in satisfactory position.   Electronically Signed   By: Gennette Pac M.D.   On: 10/04/2013 07:21    ASSESSMENT / PLAN:  PULMONARY A: Acute hypoxemic respiratory failure/ ARDS : This is most likely an acute infectious process PCP/ Immune reconstitution vs CMV pneumonitis P:    -  ARDS protocol for PEEP/FIO2 - ct 6cc/kg   INFECTIOUS A:   Acute 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> ddx broad: PCP, HCAP, fungal, less likely AFB or viral (CMV) Stage II buttock ulcers CMV viremia P:   - Treat HCAP with broad-spectrum antibiotics -consider stopping bactrim since PCP neg --Await BAL results - Continue antiviral therapy for HIV - Continue MAC prophylaxis - wound care - appreciate ID input - ct  gancyclovir-watch counts  CARDIOVASCULAR A: Sepsis  without shock Baseline hypertension P:  - dc IV fluids - dc clonidine  RENAL  Recent Labs Lab 10/03/13 0357 10/04/13 0500 10/05/13 0545  NA 131* 132* 132*    A:  Hyponatremia> 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), worse P:   - Monitor sodium  GASTROINTESTINAL A:  No acute issues P:   - Continue home pantoprazole via tube -ct tube feedings -bowel regimen, lactulose x1  HEMATOLOGIC A:  No acute issues P:  - Monitor CBC  ENDOCRINE A:   No acute issues P:   - monitor glucose on steroids and tube  feeds  NEUROLOGIC A:   Anxiety/ Pain P:   -versed int/ fent gtt -ct propofol gtt until 4/9 then transition to precedex if still requiring -ct klonopin  Updated mom & wife at bedside  CODE STATUS: Full   Summary -  Course with CXR & hypoxia improvement then worsening more indicative of IRIS vs CMV pneumonitis, but non resolution favors latter   Care during the described time interval was provided by me and/or other providers on the critical care team.  I have reviewed this patient's available data, including medical history, events of note, physical examination and test results as part of my evaluation  CC time x 1380m  Oretha MilchRakesh V Joni Colegrove  10/05/2013, 9:39 AM

## 2013-10-05 NOTE — Progress Notes (Signed)
:    [id number]/Rashard Ryle Earlene Plateravis, RN, BSN, CCM [phone] Chart Reviewed for discharge and hospital needs. Discharge needs at time of review: None present will follow for needs. Review of patient progress due on [id number]. vent day4

## 2013-10-05 NOTE — Progress Notes (Signed)
[facility]  ID: 48 yo M newly diagnosed HIV, with presumed PCP PNA +/-IRIS vs HCAP, also has low level cmv viremia   Subjective: Remains intubated. cxr slightly worse today. Afebrile    Medications: Abtx: #5cefepime, #5 bactrim(#21 since prior admit), #3 ganciclovir  Scheduled Meds: . antiseptic oral rinse  15 mL Mouth Rinse QID  . aspirin  81 mg Oral Daily  . [START ON 10/06/2013] azithromycin  1,200 mg Oral Weekly  . ceFEPime (MAXIPIME) IV  1 g Intravenous 3 times per day  . chlorhexidine  15 mL Mouth Rinse BID  . clonazePAM  1 mg Oral BID  . docusate  100 mg Oral Daily  . dolutegravir  50 mg Oral Daily  . emtricitabine-tenofovir  1 tablet Oral Daily  . enoxaparin (LOVENOX) injection  40 mg Subcutaneous Q24H  . feeding supplement (OXEPA)  1,000 mL Per Tube Q24H  . feeding supplement (PRO-STAT SUGAR FREE 64)  60 mL Per Tube TID  . ganciclovir (CYTOVENE) IV  5 mg/kg Intravenous Q12H  . insulin aspart  0-15 Units Subcutaneous 6 times per day  . methylPREDNISolone (SOLU-MEDROL) injection  40 mg Intravenous 3 times per day  . pantoprazole sodium  40 mg Per Tube Daily  . sulfamethoxazole-trimethoprim  450 mg Intravenous Q8H   Continuous Infusions: . fentaNYL infusion INTRAVENOUS 300 mcg/hr (10/05/13 1200)  . propofol 60 mcg/kg/min (10/05/13 1234)     Objective: Weight change: 4 lb 10.1 oz (2.1 kg)  Intake/Output Summary (Last 24 hours) at 10/05/13 1541 Last data filed at 10/05/13 1440  Gross per 24 hour  Intake 4243.92 ml  Output   3700 ml  Net 543.92 ml   Blood pressure 126/63, pulse 87, temperature 99 F (37.2 C), temperature source Axillary, resp. rate 11, height 5\' 10"  (1.778 m), weight 178 lb 12.7 oz (81.1 kg), SpO2 91.00%. Temp:  [98.6 F (37 C)-99.7 F (37.6 C)] 99 F (37.2 C) (04/08 1200) Pulse Rate:  [74-93] 87 (04/08 1400) Resp:  [11-19] 11 (04/08 1400) BP: (115-141)/(55-66) 126/63 mmHg (04/08 1400) SpO2:  [89 %-96 %] 91 %  (04/08 1400) FiO2 (%):  [70 %-80 %] 70 % (04/08 1200) Weight:  [178 lb 12.7 oz (81.1 kg)] 178 lb 12.7 oz (81.1 kg) (04/08 0419)  Physical Exam: General: sleepy but arousable,  on ventilator  HEENT:  EOMI  CVS tachycardic ,  no murmur rubs or gallops Chest:rhonchi anteriorly Abdomen: soft nontender, nondistended, normal bowel sounds, Extremities: no  clubbing or edema noted bilaterally Neuro: nonfocal  CBC:   Recent Labs  10/04/13 0500 10/05/13 0545  WBC 7.4 6.0  HGB 8.3* 8.5*  HCT 24.4* 24.9*  NA 132* 132*  K 4.4 4.5  CL 96 93*  CO2 26 28  BUN 16 20  CREATININE 0.72 0.70     BMET  Recent Labs  10/04/13 0500 10/05/13 0545  NA 132* 132*  K 4.4 4.5  CL 96 93*  CO2 26 28  GLUCOSE 202* 151*  BUN 16 20  CREATININE 0.72 0.70  CALCIUM 7.8* 7.8*     Micro Results: Recent Results (from the past 240 hour(s))  CULTURE, BLOOD (ROUTINE X 2)     Status: None   Collection Time    10/22/2013 10:20 AM      Result Value Ref Range Status   Specimen Description BLOOD LEFT ANTECUBITAL   Final   Special Requests BOTTLES DRAWN AEROBIC AND ANAEROBIC 5 CC EACH   Final  Culture  Setup Time     Final   Value: 10/10/2013 14:52     Performed at [facility]   Culture     Final   Value:        BLOOD CULTURE RECEIVED NO GROWTH TO DATE CULTURE WILL BE HELD FOR 5 DAYS BEFORE ISSUING A FINAL NEGATIVE REPORT     Performed at [facility]   Report Status PENDING   Incomplete  CULTURE, BLOOD (ROUTINE X 2)     Status: None   Collection Time    10/04/2013 10:35 AM      Result Value Ref Range Status   Specimen Description BLOOD RIGHT ANTECUBITAL   Final   Special Requests BOTTLES DRAWN AEROBIC AND ANAEROBIC 5CC EACH   Final   Culture  Setup Time     Final   Value: 10/05/2013 14:52     Performed at [facility]   Culture     Final   Value:        BLOOD CULTURE RECEIVED NO GROWTH TO DATE CULTURE WILL BE HELD FOR 5 DAYS BEFORE ISSUING A FINAL NEGATIVE REPORT      Performed at [facility]   Report Status PENDING   Incomplete  PNEUMOCYSTIS JIROVECI SMEAR BY DFA     Status: None   Collection Time    10/02/13 10:08 AM      Result Value Ref Range Status   Specimen Source-[facility] BRONCHIAL ALVEOLAR LAVAGE   Corrected   Comment: CORRECTED ON 04/05 AT 1024: PREVIOUSLY REPORTED AS BRONCHIAL WASHINGS   Pneumocystis jiroveci Ag NEGATIVE   Final   Comment: Performed at [facility]  CULTURE, RESPIRATORY (NON-EXPECTORATED)     Status: None   Collection Time    10/02/13 10:08 AM      Result Value Ref Range Status   Specimen Description BRONCHIAL ALVEOLAR LAVAGE   Final   Special Requests Immunocompromised   Final   Gram Stain     Final   Value: RARE WBC PRESENT, PREDOMINANTLY PMN     RARE SQUAMOUS EPITHELIAL CELLS PRESENT     NO ORGANISMS SEEN     Performed at [facility]   Culture     Final   Value: NO GROWTH 2 DAYS     Performed at [facility]   Report Status 10/04/2013 FINAL   Final  FUNGUS CULTURE W SMEAR     Status: None   Collection Time    10/02/13 10:08 AM      Result Value Ref Range Status   Specimen Description BRONCHIAL ALVEOLAR LAVAGE   Final   Special Requests Immunocompromised   Final   Fungal Smear     Final   Value: NO YEAST OR FUNGAL ELEMENTS SEEN     Performed at [facility]   Culture     Final   Value: CULTURE IN PROGRESS FOR FOUR WEEKS     Performed at [facility]   Report Status PENDING   Incomplete  AFB CULTURE WITH SMEAR     Status: None   Collection Time    10/02/13 10:08 AM      Result Value Ref Range Status   Specimen Description BRONCHIAL ALVEOLAR LAVAGE   Final   Special Requests Immunocompromised   Final   ACID FAST SMEAR     Final   Value: NO ACID FAST BACILLI SEEN     Performed at [facility]  Culture     Final   Value: CULTURE WILL BE EXAMINED FOR 6 WEEKS BEFORE ISSUING A FINAL REPORT     [facility]   Report Status  PENDING   Incomplete  CMV CULTURE CMVC     Status: None   Collection Time    10/02/13 10:08 AM      Result Value Ref Range Status   Specimen Description BRONCHIAL ALVEOLAR LAVAGE   Final   Special Requests Immunocompromised   Final   Culture     Final   Value: No Cytomegalovirus identified in cell culture                                                                A negative result does not exclude the possibility of CMV infection;inappropriate specimen collection,storage and transport may lead to false      negative results.     [facility]   Report Status 10/05/2013 FINAL   Final    Studies/Results: Dg Chest Port 1 View  10/05/2013   CLINICAL DATA:  Endotracheal tube position.  EXAM: PORTABLE CHEST - 1 VIEW  COMPARISON:  DG CHEST 1V PORT dated 10/04/2013; CT ANGIO CHEST W/CM &/OR WO/CM dated 10/26/2013; DG CHEST 1V PORT dated 10/03/2013; DG CHEST 1V PORT dated 10/17/2013; DG CHEST 2 VIEW dated 09/13/2013  FINDINGS: Endotracheal tube terminates 3.4 cm above the carina. Nasogastric tube is followed into the stomach with the side port just beyond the gastroesophageal junction. Right IJ central line tip projects over the SVC, stable. Heart size is stable. There is fairly severe diffuse bilateral airspace disease, similar to the prior exam. No definite pleural fluid. No pneumothorax. Degenerative changes are seen in the shoulders.  IMPRESSION: Fairly severe diffuse bilateral airspace disease, stable and indicative of pneumonia.   Electronically Signed   By: Leanna BattlesMelinda  Blietz M.D.   On: 10/05/2013 07:12   Dg Chest Port 1 View  10/04/2013   CLINICAL DATA:  Pneumonia.  EXAM: PORTABLE CHEST - 1 VIEW  COMPARISON:  DG CHEST 1V PORT dated 10/03/2013; DG CHEST 1V PORT dated 10/22/2013; DG CHEST 2 VIEW dated 09/13/2013  FINDINGS: The endotracheal tube terminates 3.4 cm above the carina. A right IJ line is stable in position. The NG tube courses off the inferior border the film. The side port is in the  stomach. The heart is enlarged. Diffuse interstitial and airspace disease is progressively worsening.  IMPRESSION: 1. Progressive diffuse bilateral interstitial and airspace disease. While this may represent progressive pneumonia, ARDS is also considered. 2. Support apparatus is stable and in satisfactory position.   Electronically Signed   By: Gennette Pachris  Mattern M.D.   On: 10/04/2013 07:21      Assessment/Plan:  Principal Problem:   Pneumonia Active Problems:   Acute respiratory failure   AIDS   PCP (pneumocystis carinii pneumonia)   CMV (cytomegalovirus)   Sacral decubitus ulcer, stage II   Hyponatremia   1)Presumed PCP PNA:  Continue with IV bactrim and high dose steroids. Currently on day #5 ( 21 day course of therapy). DFA stains are negative, but technically collected after 2 wks of bactrim therapy. Will check fungitell assay. occ do see discordance of isolation of fungitell+ with negative DFA from sampling.  Will decrease to daily dosing tomorrow  2) concurrent CAP: currently on day 5 of 7 with cefepime. Thus far cx remain negative. Will discontinue on completion of day 7  3)  HIV/AIDS: currently onTivicay and Truvada to accomodate for intubation and mode of delivery of meds  4) oi proph: continue weekly prophhylactic azithromycin  5) cmv viremia = concern that is worsening in setting of IRIS and possibly contributing to blurry vision vs. cmv pneumonitis. cmv cx from BAL is negative. Continue with ganciclovir for now. If clinically worsens, Dr. Allyne Gee to come to do inpatient eval. Repeat cmv vl is pending  6) ARDS likely due to immune reactivation. Continue with pulm management of vents and steroids for now   Spent with greater than 50% discussion with family and coordination of care   LOS: 4 days   Judyann Munson 10/05/2013, 3:41 PM

## 2013-10-05 NOTE — Progress Notes (Signed)
10/05/13 1600  Clinical Encounter Type  Visited With Family;Patient not available  Visit Type Follow-up;Spiritual support;Social support  Referral From Family  Spiritual Encounters  Spiritual Needs Emotional;Prayer   Had lengthy private conversation with wife William Bender, providing pastoral support as she shared and processed stories about her family, William Bender's illness, their spiritual growth, their support network, and hopes for the future.  Also provided prayer shawl and heart pillow as tangible signs of support and comfort, as well as prayer at bedside.  William Bender very Adult nurseappreciative.  Will continue to follow for support, but please also page as needs arise;  [phone].  Thank you.  [address] Wood StreetChaplain Dnaiel Voller Son[place] UniversityLundeen, [place] [phone]

## 2013-10-05 NOTE — Telephone Encounter (Signed)
[facility]. Notified their office via fax that patient is currently hospitalized. Wendall MolaJacqueline Dymond Gutt

## 2013-10-06 ENCOUNTER — Ambulatory Visit (HOSPITAL_COMMUNITY): Payer: BC Managed Care – PPO

## 2013-10-06 ENCOUNTER — Inpatient Hospital Stay (HOSPITAL_COMMUNITY): Payer: BC Managed Care – PPO

## 2013-10-06 DIAGNOSIS — J982 Interstitial emphysema: Secondary | ICD-10-CM | POA: Diagnosis not present

## 2013-10-06 LAB — BLOOD GAS, ARTERIAL
ACID-BASE EXCESS: 5.8 mmol/L — AB (ref 0.0–2.0)
ACID-BASE EXCESS: 6.1 mmol/L — AB (ref 0.0–2.0)
ACID-BASE EXCESS: 6.5 mmol/L — AB (ref 0.0–2.0)
BICARBONATE: 34.2 meq/L — AB (ref 20.0–24.0)
Bicarbonate: 32 mEq/L — ABNORMAL HIGH (ref 20.0–24.0)
Bicarbonate: 32.3 mEq/L — ABNORMAL HIGH (ref 20.0–24.0)
DRAWN BY: 308601
DRAWN BY: 308601
DRAWN BY: 308601
FIO2: 0.7 %
FIO2: 0.8 %
FIO2: 0.7 %
MECHVT: 440 mL
MECHVT: 440 mL
O2 Saturation: 95.6 %
O2 Saturation: 95.2 %
O2 Saturation: 93.5 %
PCO2 ART: 57.1 mmHg — AB (ref 35.0–45.0)
PEEP: 14 cmH2O
PEEP: 14 cmH2O
PEEP: 14 cmH2O
PH ART: 7.367 (ref 7.350–7.450)
PH ART: 7.369 (ref 7.350–7.450)
PO2 ART: 93.2 mmHg (ref 80.0–100.0)
PO2 ART: 75.3 mmHg — AB (ref 80.0–100.0)
Patient temperature: 98.6
Patient temperature: 98.6
Patient temperature: 98.6
RATE: 22 resp/min
RATE: 22 resp/min
RATE: 30 resp/min
TCO2: 29.5 mmol/L (ref 0–100)
TCO2: 32.9 mmol/L (ref 0–100)
TCO2: 30.7 mmol/L (ref 0–100)
VT: 440 mL
pCO2 arterial: 77.2 mmHg (ref 35.0–45.0)
pCO2 arterial: 57.3 mmHg (ref 35.0–45.0)
pH, Arterial: 7.269 — ABNORMAL LOW (ref 7.350–7.450)
pO2, Arterial: 89.6 mmHg (ref 80.0–100.0)

## 2013-10-06 LAB — BASIC METABOLIC PANEL
BUN: 22 mg/dL (ref 6–23)
CO2: 32 meq/L (ref 19–32)
Calcium: 7.8 mg/dL — ABNORMAL LOW (ref 8.4–10.5)
Chloride: 94 mEq/L — ABNORMAL LOW (ref 96–112)
Creatinine, Ser: 0.7 mg/dL (ref 0.50–1.35)
GFR calc Af Amer: >90 mL/min (ref >90)
GFR calc non Af Amer: >90 mL/min (ref >90)
GLUCOSE: 135 mg/dL — AB (ref 70–99)
POTASSIUM: 4.5 meq/L (ref 3.7–5.3)
SODIUM: 135 meq/L — AB (ref 137–147)

## 2013-10-06 LAB — GLUCOSE, CAPILLARY
GLUCOSE-CAPILLARY: 110 mg/dL — AB (ref 70–99)
GLUCOSE-CAPILLARY: 129 mg/dL — AB (ref 70–99)
GLUCOSE-CAPILLARY: 135 mg/dL — AB (ref 70–99)
GLUCOSE-CAPILLARY: 138 mg/dL — AB (ref 70–99)
GLUCOSE-CAPILLARY: 145 mg/dL — AB (ref 70–99)
GLUCOSE-CAPILLARY: 146 mg/dL — AB (ref 70–99)
Glucose-Capillary: 155 mg/dL — ABNORMAL HIGH (ref 70–99)

## 2013-10-06 LAB — CBC
HCT: 25.5 % — ABNORMAL LOW (ref 39.0–52.0)
Hemoglobin: 8.3 g/dL — ABNORMAL LOW (ref 13.0–17.0)
MCH: 29.3 pg (ref 26.0–34.0)
MCHC: 32.5 g/dL (ref 30.0–36.0)
MCV: 90.1 fL (ref 78.0–100.0)
Platelets: 168 10*3/uL (ref 150–400)
RBC: 2.83 MIL/uL — AB (ref 4.22–5.81)
RDW: 14.9 % (ref 11.5–15.5)
WBC: 5.1 10*3/uL (ref 4.0–10.5)

## 2013-10-06 LAB — CRYPTOCOCCAL ANTIGEN: CRYPTO AG: NEGATIVE

## 2013-10-06 MED ORDER — ALBUTEROL SULFATE (2.5 MG/3ML) 0.083% IN NEBU: 3.0000 mL | INHALATION_SOLUTION | RESPIRATORY_TRACT | Status: DC | PRN

## 2013-10-06 MED ORDER — MIDAZOLAM HCL 2 MG/2ML IJ SOLN
1.0000 mg | INTRAMUSCULAR | Status: DC | PRN
  Administered 2013-10-10: 2 mg via INTRAVENOUS
  Administered 2013-10-10: 4 mg via INTRAVENOUS
  Administered 2013-10-10 – 2013-10-17 (×32): 2 mg via INTRAVENOUS
  Filled 2013-10-06 (×35): qty 2

## 2013-10-06 MED ORDER — ARTIFICIAL TEARS OP OINT
1.0000 "application " | TOPICAL_OINTMENT | Freq: Three times a day (TID) | OPHTHALMIC | Status: DC
  Administered 2013-10-06 – 2013-10-11 (×15): 1 via OPHTHALMIC
  Filled 2013-10-06: qty 3.5

## 2013-10-06 MED ORDER — CISATRACURIUM BOLUS VIA INFUSION
0.1000 mg/kg | Freq: Once | INTRAVENOUS | Status: AC
  Administered 2013-10-06: 8.1 mg via INTRAVENOUS
  Filled 2013-10-06: qty 9

## 2013-10-06 MED ORDER — IOHEXOL 300 MG/ML  SOLN
80.0000 mL | Freq: Once | INTRAMUSCULAR | Status: AC | PRN
  Administered 2013-10-06: 80 mL via INTRAVENOUS

## 2013-10-06 MED ORDER — CISATRACURIUM BESYLATE 10 MG/ML IV SOLN
3.0000 ug/kg/min | INTRAVENOUS | Status: DC
  Administered 2013-10-06 – 2013-10-07 (×2): 3 ug/kg/min via INTRAVENOUS
  Administered 2013-10-08 (×2): 3.5 ug/kg/min via INTRAVENOUS
  Administered 2013-10-09: 4 ug/kg/min via INTRAVENOUS
  Filled 2013-10-06 (×5): qty 20

## 2013-10-06 MED ORDER — ACETAMINOPHEN 160 MG/5ML PO SOLN
650.0000 mg | Freq: Four times a day (QID) | ORAL | Status: DC | PRN
  Administered 2013-10-07 – 2013-11-06 (×17): 650 mg
  Filled 2013-10-06 (×18): qty 20.3

## 2013-10-06 MED ORDER — ACETAMINOPHEN 160 MG/5ML PO SOLN
650.0000 mg | Freq: Four times a day (QID) | ORAL | Status: DC | PRN
  Filled 2013-10-06: qty 20.3

## 2013-10-06 NOTE — Progress Notes (Signed)
[facility]  ID: 48 yo M newly diagnosed HIV, with presumed PCP PNA +/-IRIS vs HCAP, also has low level cmv viremia   Subjective: Remains intubated. Sedated. Pulled ng last night. CT today that shows extensive pneumomediastinum and subcutaneous emphysema  Medications: Abtx: #6cefepime, #6 bactrim(#22 since prior admit), #4 ganciclovir  Scheduled Meds: . antiseptic oral rinse  15 mL Mouth Rinse QID  . aspirin  81 mg Oral Daily  . azithromycin  1,200 mg Oral Weekly  . ceFEPime (MAXIPIME) IV  1 g Intravenous 3 times per day  . chlorhexidine  15 mL Mouth Rinse BID  . clonazePAM  1 mg Oral BID  . docusate  100 mg Oral Daily  . dolutegravir  50 mg Oral Daily  . emtricitabine-tenofovir  1 tablet Oral Daily  . enoxaparin (LOVENOX) injection  40 mg Subcutaneous Q24H  . feeding supplement (OXEPA)  1,000 mL Per Tube Q24H  . feeding supplement (PRO-STAT SUGAR FREE 64)  60 mL Per Tube TID  . ganciclovir (CYTOVENE) IV  5 mg/kg Intravenous Q12H  . insulin aspart  0-15 Units Subcutaneous 6 times per day  . methylPREDNISolone (SOLU-MEDROL) injection  40 mg Intravenous 3 times per day  . pantoprazole sodium  40 mg Per Tube Daily  . sulfamethoxazole-trimethoprim  450 mg Intravenous Q8H   Continuous Infusions: . fentaNYL infusion INTRAVENOUS 300 mcg/hr (10/06/13 1000)  . propofol 70 mcg/kg/min (10/06/13 1120)     Objective: Weight change: -3.5 oz (-0.1 kg)  Intake/Output Summary (Last 24 hours) at 10/06/13 1331 Last data filed at 10/06/13 1100  Gross per 24 hour  Intake 4024.9 ml  Output   2705 ml  Net 1319.9 ml   Blood pressure 150/78, pulse 90, temperature 98.1 F (36.7 C), temperature source Axillary, resp. rate 19, height 5\' 10"  (1.778 m), weight 178 lb 9.2 oz (81 kg), SpO2 96.00%. Temp:  [98.1 F (36.7 C)-102 F (38.9 C)] 98.1 F (36.7 C) (04/09 1200) Pulse Rate:  [82-115] 90 (04/09 1200) Resp:  [11-25] 19 (04/09 1200) BP: (123-164)/(61-90) 150/78  mmHg (04/09 1100) SpO2:  [85 %-100 %] 96 % (04/09 1200) FiO2 (%):  [60 %-70 %] 60 % (04/09 1211) Weight:  [178 lb 9.2 oz (81 kg)] 178 lb 9.2 oz (81 kg) (04/09 0459)  Physical Exam: General: sedated,  on ventilator  HEENT:  EOMI  CVS tachycardic ,  no murmur rubs or gallops Chest:rhonchi anteriorly Chest wall: crepitus on anterior chest wall bilaterally Abdomen: soft nontender, nondistended, normal bowel sounds, Extremities: no  clubbing or edema noted bilaterally Neuro: nonfocal  CBC:   Recent Labs  10/05/13 0545 10/06/13 0500  WBC 6.0 5.1  HGB 8.5* 8.3*  HCT 24.9* 25.5*  NA 132* 135*  K 4.5 4.5  CL 93* 94*  CO2 28 32  BUN 20 22  CREATININE 0.70 0.70     BMET  Recent Labs  10/05/13 0545 10/06/13 0500  NA 132* 135*  K 4.5 4.5  CL 93* 94*  CO2 28 32  GLUCOSE 151* 135*  BUN 20 22  CREATININE 0.70 0.70  CALCIUM 7.8* 7.8*     Micro Results: Recent Results (from the past 240 hour(s))  CULTURE, BLOOD (ROUTINE X 2)     Status: None   Collection Time    10/04/2013 10:20 AM      Result Value Ref Range Status   Specimen Description BLOOD LEFT ANTECUBITAL   Final   Special Requests BOTTLES DRAWN AEROBIC AND ANAEROBIC  5 CC EACH   Final   Culture  Setup Time     Final   Value: 09/29/2013 14:52     Performed at [facility]   Culture     Final   Value:        BLOOD CULTURE RECEIVED NO GROWTH TO DATE CULTURE WILL BE HELD FOR 5 DAYS BEFORE ISSUING A FINAL NEGATIVE REPORT     Performed at [facility]   Report Status PENDING   Incomplete  CULTURE, BLOOD (ROUTINE X 2)     Status: None   Collection Time    09/29/2013 10:35 AM      Result Value Ref Range Status   Specimen Description BLOOD RIGHT ANTECUBITAL   Final   Special Requests BOTTLES DRAWN AEROBIC AND ANAEROBIC 5CC EACH   Final   Culture  Setup Time     Final   Value: 10/06/2013 14:52     Performed at [facility]   Culture     Final   Value:        BLOOD CULTURE RECEIVED NO GROWTH TO  DATE CULTURE WILL BE HELD FOR 5 DAYS BEFORE ISSUING A FINAL NEGATIVE REPORT     Performed at [facility]   Report Status PENDING   Incomplete  PNEUMOCYSTIS JIROVECI SMEAR BY DFA     Status: None   Collection Time    10/02/13 10:08 AM      Result Value Ref Range Status   Specimen Source-[facility] BRONCHIAL ALVEOLAR LAVAGE   Corrected   Comment: CORRECTED ON 04/05 AT 1024: PREVIOUSLY REPORTED AS BRONCHIAL WASHINGS   Pneumocystis jiroveci Ag NEGATIVE   Final   Comment: Performed at [facility]  CULTURE, RESPIRATORY (NON-EXPECTORATED)     Status: None   Collection Time    10/02/13 10:08 AM      Result Value Ref Range Status   Specimen Description BRONCHIAL ALVEOLAR LAVAGE   Final   Special Requests Immunocompromised   Final   Gram Stain     Final   Value: RARE WBC PRESENT, PREDOMINANTLY PMN     RARE SQUAMOUS EPITHELIAL CELLS PRESENT     NO ORGANISMS SEEN     Performed at [facility]   Culture     Final   Value: NO GROWTH 2 DAYS     Performed at [facility]   Report Status 10/04/2013 FINAL   Final  FUNGUS CULTURE W SMEAR     Status: None   Collection Time    10/02/13 10:08 AM      Result Value Ref Range Status   Specimen Description BRONCHIAL ALVEOLAR LAVAGE   Final   Special Requests Immunocompromised   Final   Fungal Smear     Final   Value: NO YEAST OR FUNGAL ELEMENTS SEEN     Performed at [facility]   Culture     Final   Value: CULTURE IN PROGRESS FOR FOUR WEEKS     Performed at [facility]   Report Status PENDING   Incomplete  AFB CULTURE WITH SMEAR     Status: None   Collection Time    10/02/13 10:08 AM      Result Value Ref Range Status   Specimen Description BRONCHIAL ALVEOLAR LAVAGE   Final   Special Requests Immunocompromised   Final   ACID FAST SMEAR     Final   Value: NO ACID FAST BACILLI SEEN  Performed at [facility]     Final   Value: CULTURE WILL BE EXAMINED FOR 6 WEEKS BEFORE  ISSUING A FINAL REPORT     Performed at [facility]   Report Status PENDING   Incomplete  CMV CULTURE CMVC     Status: None   Collection Time    10/02/13 10:08 AM      Result Value Ref Range Status   Specimen Description BRONCHIAL ALVEOLAR LAVAGE   Final   Special Requests Immunocompromised   Final   Culture     Final   Value: No Cytomegalovirus identified in cell culture                                                                A negative result does not exclude the possibility of CMV infection;inappropriate specimen collection,storage and transport may lead to false      negative results.     Performed at [facility]   Report Status 10/05/2013 FINAL   Final    Studies/Results: Dg Abd 1 View  10/05/2013   CLINICAL DATA:  Gastric tube placement.  EXAM: ABDOMEN - 1 VIEW  COMPARISON:  10/02/2013  FINDINGS: Enteric tube has been advanced slightly with tip and side poor overlying the stomach in the left upper quadrant. Bowel gas pattern is nonobstructive. Remainder the exam is unchanged.  IMPRESSION: Nonobstructive bowel gas pattern.  Enteric tube with tip and side-port over the region of the stomach in the left upper quadrant.   Electronically Signed   By: Elberta Fortis M.D.   On: 10/05/2013 22:12   Ct Chest W Contrast  10/06/2013   CLINICAL DATA:  HIV, pneumonia, pneumomediastinum.  EXAM: CT CHEST WITH CONTRAST  TECHNIQUE: Multidetector CT imaging of the chest was performed during intravenous contrast administration.  CONTRAST:  80mL OMNIPAQUE IOHEXOL 300 MG/ML  SOLN  COMPARISON:  DG CHEST 1V PORT dated 10/06/2013; CT ANGIO CHEST W/CM &/OR WO/CM dated 10/22/2013  FINDINGS: Extensive pneumomediastinum and subcutaneous emphysema, new since prior CT. Gas is most pronounced in the anterior mediastinum and in the anterior chest and lower neck. There is a small associated right pneumothorax, best seen inferiorly on images 48-51. No definite left pneumothorax.  Endotracheal tube tip is  in the lower trachea. NG tube enters the stomach. Right central line tip is in the SVC.  Severe diffuse bilateral ground-glass areas of consolidation throughout both lungs. This is increased since prior study. Slight relative sparing in the apices and bases. No pleural effusions.  Small scattered mediastinal lymph nodes, none pathologically enlarged. No hilar or axillary adenopathy.  Imaging into the upper abdomen shows no acute findings.  IMPRESSION: Extensive pneumomediastinum and subcutaneous emphysema within the chest and visualized lower neck. Very small associated right pneumothorax, best seen at the right lung base. No definite left pneumothorax.  Severe diffuse ground-glass consolidation throughout the lungs bilaterally, worsening since prior study. This could reflect edema, hemorrhage or infection.   Electronically Signed   By: Charlett Nose M.D.   On: 10/06/2013 10:49   Dg Chest Port 1 View  10/06/2013   CLINICAL DATA:  Bilateral pneumonia.  EXAM: PORTABLE CHEST - 1 VIEW  COMPARISON:  DG CHEST 1V PORT dated 10/05/2013; DG ABDOMEN 1V  dated 10/05/2013  FINDINGS: Endotracheal tube ends in the mid to lower thoracic trachea. Orogastric tube enters the stomach. Right IJ catheter, tip at the SVC.  New diffuse subcutaneous emphysema and pneumomediastinum. Possible trace left apical pneumothorax. There is extensive bilateral pneumonia, extent similar to prior. No increasing pleural fluid. Stable heart size.  These results were called by telephone at the time of interpretation on 10/06/2013 at 6:29 AM to RN Denny Peon, who verbally acknowledged these results and is relaying message to on-call physician.  IMPRESSION: 1. New pneumomediastinum and subcutaneous emphysema. Possible, but not definite, trace left apical pneumothorax. Consider short followup chest x-ray. 2. Tubes and lines in unchanged position. 3. Unchanged extensive pneumonia.   Electronically Signed   By: Tiburcio Pea M.D.   On: 10/06/2013 06:34   Dg Chest  Port 1 View  10/05/2013   CLINICAL DATA:  Endotracheal tube position.  EXAM: PORTABLE CHEST - 1 VIEW  COMPARISON:  DG CHEST 1V PORT dated 10/04/2013; CT ANGIO CHEST W/CM &/OR WO/CM dated 10/03/2013; DG CHEST 1V PORT dated 10/03/2013; DG CHEST 1V PORT dated 10/14/2013; DG CHEST 2 VIEW dated 09/13/2013  FINDINGS: Endotracheal tube terminates 3.4 cm above the carina. Nasogastric tube is followed into the stomach with the side port just beyond the gastroesophageal junction. Right IJ central line tip projects over the SVC, stable. Heart size is stable. There is fairly severe diffuse bilateral airspace disease, similar to the prior exam. No definite pleural fluid. No pneumothorax. Degenerative changes are seen in the shoulders.  IMPRESSION: Fairly severe diffuse bilateral airspace disease, stable and indicative of pneumonia.   Electronically Signed   By: Leanna Battles M.D.   On: 10/05/2013 07:12      Assessment/Plan:  Principal Problem:   Pneumonia Active Problems:   Acute respiratory failure   AIDS   PCP (pneumocystis carinii pneumonia)   CMV (cytomegalovirus)   Sacral decubitus ulcer, stage II   Hyponatremia   Pneumomediastinum   1)Presumed PCP PNA:  Continue with IV bactrim and high dose steroids. Currently on day #6 ( 22 day course of therapy). DFA stains are negative, but technically collected after 2 wks of bactrim therapy. Will check fungitell assay. occ do see discordance of isolation of fungitell+ with negative DFA from sampling. Will continue at high dose bactrim. He has developed complications of pneumomediastinum which is occasionally seen with PCP  2) concurrent CAP: currently on day 6 of 7 with cefepime. Thus far cx remain negative. Will complete tomorrow  3)  HIV/AIDS: currently onTivicay and Truvada to accomodate for intubation and mode of delivery of meds  4) oi proph: continue weekly prophhylactic azithromycin  5) cmv viremia =  possibly contributing to blurry vision vs. cmv  pneumonitis. cmv cx from BAL is negative. Continue with ganciclovir for now. No leukopenia SE thus far. If clinically worsens, Dr. Allyne Gee to come to do inpatient eval. Repeat cmv vl is pending  6) ARDS likely due to immune reactivation. Continue with pulm management of vents and steroids for now   Spent with greater than 50% discussion with family and coordination of care   LOS: 5 days   Judyann Munson 10/06/2013, 1:31 PM

## 2013-10-06 NOTE — Progress Notes (Signed)
PULMONARY / CRITICAL CARE MEDICINE   Name: William Bender MRN: [id number] DOB: 11-30-1965    ADMISSION DATE:  10/08/2013 CONSULTATION DATE:  10/04/2013  REFERRING MD :  [facility] PRIMARY SERVICE: PCCM  CHIEF COMPLAINT:  Shortness of breath, fever, cough  BRIEF PATIENT DESCRIPTION: 48 y/o male recently diagnosed with HIV/AIDS -CD4 =30 on 3/31 and treated empirically for PCP returned to the [facility] ED on 4/4 with acute hypoxemic respiratory failure. Note that sputum PCP was neg 3/18  SIGNIFICANT EVENTS: 4/09 Pneumomediastinum  STUDIES:  4/4 CT angio > no PE, diffuse bilateral GGO and patchy consolidation worse in the bases L > R  LINES / TUBES: 4/5 ETT >> 4/5 R IJ CVL >>  CULTURES: 4/4 blood >> 4/4 urine hist >> 4/4 CMV pcr >> POS 4/4 PCP DFA induced sputum >> 4/5 PCP DFA BAL LUL >>neg  4/5 resp culture bal >>ng 4/5 fungal  bal >>neg 4/5 afb  bal >>neg 4/5 cmv bal >>  ANTIBIOTICS: Per ID 4/4 Bactrim >> 4/4 Vanc >>  4/4 Cefepime >>  SUBJECTIVE:  Fever again overnight.  New subcutaneous air noted.  VITAL SIGNS: Temp:  [99 F (37.2 C)-102 F (38.9 C)] 102 F (38.9 C) (04/08 2357) Pulse Rate:  [74-115] 92 (04/09 0900) Resp:  [11-25] 21 (04/09 0900) BP: (117-164)/(56-90) 139/64 mmHg (04/09 0900) SpO2:  [85 %-99 %] 95 % (04/09 0900) FiO2 (%):  [60 %-70 %] 60 % (04/09 0852) Weight:  [178 lb 9.2 oz (81 kg)] 178 lb 9.2 oz (81 kg) (04/09 0459) VENTILATOR SETTINGS: Vent Mode:  [-] PRVC FiO2 (%):  [60 %-70 %] 60 % Set Rate:  [22 bmp] 22 bmp Vt Set:  [440 mL] 440 mL PEEP:  [14 cmH20] 14 cmH20 Plateau Pressure:  [28 cmH20-33 cmH20] 30 cmH20 INTAKE / OUTPUT: Intake/Output     04/08 0701 - 04/09 0700 04/09 0701 - 04/10 0700   I.V. (mL/kg) 2274.4 (28.1) 102.1 (1.3)   Other     NG/GT 750 20   IV Piggyback 1940    Total Intake(mL/kg) 4964.4 (61.3) 122.1 (1.5)   Urine (mL/kg/hr) 3795 (2) 425 (2.4)   Total Output 3795 425   Net +1169.4 -302.9          PHYSICAL  EXAMINATION:  Gen: ill appearing HEENT: ETT in place PULM: coarse breath sounds with scattered wheeze, crepitus over chest CV: regular AB: soft, non tender Neuro: RASS -1 Ext: 1+ edema Derm: no rashes   LABS:  CBC  Recent Labs Lab 10/04/13 0500 10/05/13 0545 10/06/13 0500  WBC 7.4 6.0 5.1  HGB 8.3* 8.5* 8.3*  HCT 24.4* 24.9* 25.5*  PLT 183 175 168   BMET  Recent Labs Lab 10/04/13 0500 10/05/13 0545 10/06/13 0500  NA 132* 132* 135*  K 4.4 4.5 4.5  CL 96 93* 94*  CO2 26 28 32  BUN 16 20 22   CREATININE 0.72 0.70 0.70  GLUCOSE 202* 151* 135*   Electrolytes  Recent Labs Lab 10/04/13 0500 10/05/13 0545 10/06/13 0500  CALCIUM 7.8* 7.8* 7.8*  MG  --  2.0  --   PHOS  --  3.1  --    Sepsis Markers  Recent Labs Lab 10/08/2013 1019  LATICACIDVEN 1.56   ABG  Recent Labs Lab 10/03/13 0600 10/05/13 0552 10/06/13 0457  PHART 7.332* 7.363 7.367  PCO2ART 42.9 51.2* 57.1*  PO2ART 78.7* 101.0* 89.6   Liver Enzymes  Recent Labs Lab 10/08/2013 1005  AST 43*  ALT 47  ALKPHOS 53  BILITOT 0.2*  ALBUMIN 2.5*   Cardiac Enzymes  Recent Labs Lab 10/16/2013 1005  TROPONINI <0.30  PROBNP 460.5*   Glucose  Recent Labs Lab 10/05/13 1158 10/05/13 1621 10/05/13 2033 10/05/13 2337 10/06/13 0422 10/06/13 0814  GLUCAP 142* 138* 116* 110* 129* 138*    Imaging  Dg Abd 1 View  10/05/2013   CLINICAL DATA:  Gastric tube placement.  EXAM: ABDOMEN - 1 VIEW  COMPARISON:  10/02/2013  FINDINGS: Enteric tube has been advanced slightly with tip and side poor overlying the stomach in the left upper quadrant. Bowel gas pattern is nonobstructive. Remainder the exam is unchanged.  IMPRESSION: Nonobstructive bowel gas pattern.  Enteric tube with tip and side-port over the region of the stomach in the left upper quadrant.   Electronically Signed   By: Elberta Fortis M.D.   On: 10/05/2013 22:12   Dg Chest Port 1 View  10/06/2013   CLINICAL DATA:  Bilateral pneumonia.  EXAM:  PORTABLE CHEST - 1 VIEW  COMPARISON:  DG CHEST 1V PORT dated 10/05/2013; DG ABDOMEN 1V dated 10/05/2013  FINDINGS: Endotracheal tube ends in the mid to lower thoracic trachea. Orogastric tube enters the stomach. Right IJ catheter, tip at the SVC.  New diffuse subcutaneous emphysema and pneumomediastinum. Possible trace left apical pneumothorax. There is extensive bilateral pneumonia, extent similar to prior. No increasing pleural fluid. Stable heart size.  These results were called by telephone at the time of interpretation on 10/06/2013 at 6:29 AM to RN Denny Peon, who verbally acknowledged these results and is relaying message to on-call physician.  IMPRESSION: 1. New pneumomediastinum and subcutaneous emphysema. Possible, but not definite, trace left apical pneumothorax. Consider short followup chest x-ray. 2. Tubes and lines in unchanged position. 3. Unchanged extensive pneumonia.   Electronically Signed   By: Tiburcio Pea M.D.   On: 10/06/2013 06:34   Dg Chest Port 1 View  10/05/2013   CLINICAL DATA:  Endotracheal tube position.  EXAM: PORTABLE CHEST - 1 VIEW  COMPARISON:  DG CHEST 1V PORT dated 10/04/2013; CT ANGIO CHEST W/CM &/OR WO/CM dated 10/18/2013; DG CHEST 1V PORT dated 10/03/2013; DG CHEST 1V PORT dated 10/04/2013; DG CHEST 2 VIEW dated 09/13/2013  FINDINGS: Endotracheal tube terminates 3.4 cm above the carina. Nasogastric tube is followed into the stomach with the side port just beyond the gastroesophageal junction. Right IJ central line tip projects over the SVC, stable. Heart size is stable. There is fairly severe diffuse bilateral airspace disease, similar to the prior exam. No definite pleural fluid. No pneumothorax. Degenerative changes are seen in the shoulders.  IMPRESSION: Fairly severe diffuse bilateral airspace disease, stable and indicative of pneumonia.   Electronically Signed   By: Leanna Battles M.D.   On: 10/05/2013 07:12    ASSESSMENT / PLAN:  PULMONARY A:  Acute respiratory failure with ARDS  2nd to PNA in setting of HIV. Pneumomediastinum developed 4/09. P:   Wean PEEP, FiO2 per ARDS protocol Not ready for vent weaning F/u CT chest with IV contrast 4/09 Continue solumedrol 40 mg IV q8h with possible PCP  INFECTIOUS A:   PNA in setting of HIV >> bacterial, viral, PCP. Stage II buttock ulcers. CMV viremia. P:   Abx, anti-viral per ID F/u Cx results  CARDIOVASCULAR A:  Severe sepsis. Hx of HTN. 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 P:   Monitor electrolytes, renal fx, urine outpt  GASTROINTESTINAL A:   Protein calorie malnutrition. P:   Protonix  for SUP Tube feeds while on vent  HEMATOLOGIC A:   Anemia of critical illness and chronic disease. P:  F/u CBC Lovenox for DVT prevention  ENDOCRINE A:   Steroid induced hyperglycemia. P:   SSI  NEUROLOGIC A:    Anxiety, pain control, sedation. P:   Continue klonopin Continue diprivan, fentanyl gtt with prn versed Goal RASS -1  Updated family at bedside.  CC time 35 minutes.  Coralyn HellingVineet Rheya Minogue, MD [facility] 10/06/2013, 9:20 AM Pager:  [phone] After 3pm call: [phone]

## 2013-10-06 NOTE — Progress Notes (Addendum)
eLink Physician-Brief Progress Note Patient Name: William MustBrent Bender DOB: 04/19/1966 MRN: [id number]  Date of Service  10/06/2013   HPI/Events of Note   Asynchrony Increased subcutaneous emphysema Increased PIP   eICU Interventions   PCXR:  No tension pneumothorax Nimbex bolus and gtt Will assess mechanics / ABG after paralyzed     Intervention Category Major Interventions: Respiratory failure - evaluation and management  Skylene Deremer 10/06/2013, 6:14 PM

## 2013-10-06 NOTE — Progress Notes (Signed)
eLink Physician-Brief Progress Note Patient Name: William MustBrent Bender DOB: 09/04/1965 MRN: [id number]  Date of Service  10/06/2013   HPI/Events of Note    Recent Labs Lab 10/02/13 1152 10/03/13 0600 10/05/13 0552 10/06/13 0457 10/06/13 1950  PHART 7.378 7.332* 7.363 7.367 7.269*  PCO2ART 40.5 42.9 51.2* 57.1* 77.2*  PO2ART 187.0* 78.7* 101.0* 89.6 93.2  HCO3 23.1 22.1 28.2* 32.0* 34.2*  TCO2 21.6 21.0 26.9 29.5 32.9  O2SAT 98.7 94.1 95.4 95.6 95.2   Improved synchrony / PIP with paralysis   eICU Interventions   RR increased 22 --> 30 ABG in 1 hour    Intervention Category Major Interventions: Respiratory failure - evaluation and management  Dorri Ozturk 10/06/2013, 8:13 PM

## 2013-10-07 ENCOUNTER — Inpatient Hospital Stay (HOSPITAL_COMMUNITY): Payer: BC Managed Care – PPO

## 2013-10-07 DIAGNOSIS — E871 Hypo-osmolality and hyponatremia: Secondary | ICD-10-CM

## 2013-10-07 LAB — CBC
HCT: 27 % — ABNORMAL LOW (ref 39.0–52.0)
Hemoglobin: 8.8 g/dL — ABNORMAL LOW (ref 13.0–17.0)
MCH: 29.2 pg (ref 26.0–34.0)
MCHC: 32.6 g/dL (ref 30.0–36.0)
MCV: 89.7 fL (ref 78.0–100.0)
PLATELETS: 175 10*3/uL (ref 150–400)
RBC: 3.01 MIL/uL — AB (ref 4.22–5.81)
RDW: 14.7 % (ref 11.5–15.5)
WBC: 6.5 10*3/uL (ref 4.0–10.5)

## 2013-10-07 LAB — BASIC METABOLIC PANEL
BUN: 25 mg/dL — ABNORMAL HIGH (ref 6–23)
CALCIUM: 8 mg/dL — AB (ref 8.4–10.5)
CO2: 33 mEq/L — ABNORMAL HIGH (ref 19–32)
CREATININE: 0.63 mg/dL (ref 0.50–1.35)
Chloride: 92 mEq/L — ABNORMAL LOW (ref 96–112)
Glucose, Bld: 145 mg/dL — ABNORMAL HIGH (ref 70–99)
Potassium: 4.7 mEq/L (ref 3.7–5.3)
SODIUM: 133 meq/L — AB (ref 137–147)

## 2013-10-07 LAB — CULTURE, BLOOD (ROUTINE X 2)
Culture: NO GROWTH
Culture: NO GROWTH

## 2013-10-07 LAB — GLUCOSE, CAPILLARY
GLUCOSE-CAPILLARY: 138 mg/dL — AB (ref 70–99)
GLUCOSE-CAPILLARY: 107 mg/dL — AB (ref 70–99)
Glucose-Capillary: 129 mg/dL — ABNORMAL HIGH (ref 70–99)
Glucose-Capillary: 130 mg/dL — ABNORMAL HIGH (ref 70–99)
Glucose-Capillary: 137 mg/dL — ABNORMAL HIGH (ref 70–99)
Glucose-Capillary: 139 mg/dL — ABNORMAL HIGH (ref 70–99)
Glucose-Capillary: 147 mg/dL — ABNORMAL HIGH (ref 70–99)

## 2013-10-07 MED ORDER — OXEPA PO LIQD
1000.0000 mL | ORAL | Status: DC
  Administered 2013-10-09 – 2013-10-10 (×2): 1000 mL
  Filled 2013-10-07 (×4): qty 1000

## 2013-10-07 MED ORDER — PRO-STAT SUGAR FREE PO LIQD
30.0000 mL | Freq: Four times a day (QID) | ORAL | Status: DC
  Administered 2013-10-07 – 2013-10-14 (×29): 30 mL
  Filled 2013-10-07 (×31): qty 30

## 2013-10-07 MED ORDER — FUROSEMIDE 10 MG/ML IJ SOLN
40.0000 mg | Freq: Four times a day (QID) | INTRAMUSCULAR | Status: DC
  Administered 2013-10-07 – 2013-10-11 (×14): 40 mg via INTRAVENOUS
  Filled 2013-10-07 (×18): qty 4

## 2013-10-07 MED ORDER — FUROSEMIDE 10 MG/ML IJ SOLN
INTRAMUSCULAR | Status: AC
  Administered 2013-10-07: 40 mg
  Filled 2013-10-07: qty 4

## 2013-10-07 NOTE — Progress Notes (Signed)
PULMONARY / CRITICAL CARE MEDICINE   Name: William Bender MRN: [id number] DOB: 05/06/1966    ADMISSION DATE:  10/23/2013 CONSULTATION DATE:  09/28/2013  REFERRING MD :  Rancour PRIMARY SERVICE: PCCM  CHIEF COMPLAINT:  Shortness of breath, fever, cough  BRIEF PATIENT DESCRIPTION: 48 y/o male recently diagnosed with HIV/AIDS -CD4 =30 on 3/31 and treated empirically for PCP [facility] with acute hypoxemic respiratory failure. Note that sputum PCP was neg 3/18  SIGNIFICANT EVENTS: 4/09 Pneumomediastinum, nimbex added  STUDIES:  4/4 CT angio > no PE, diffuse bilateral GGO and patchy consolidation worse in the bases L > R 4/9 CT chest > pneumomediastinum with subcutaneous emphysema, severe diffuse GGO b/l  LINES / TUBES: 4/5 ETT >> 4/5 R IJ CVL >>  CULTURES: 4/4 blood >> negative 4/4 urine hist >> negative 4/4 CMV pcr >> POSITIVE 4/5 PCP DFA BAL LUL >>negative 4/5 resp culture bal >>negative 4/5 fungal  bal >> 4/5 afb  bal >> 4/5 cmv bal >> negative  ANTIBIOTICS: Per ID 4/4 Bactrim >> 4/4 Vanc >> 4/4 4/4 Cefepime >>  SUBJECTIVE:  Nimbex added overnight.  VITAL SIGNS: Temp:  [96.8 F (36 C)-100.4 F (38 C)] 96.8 F (36 C) (04/10 0800) Pulse Rate:  [77-100] 87 (04/10 1000) Resp:  [16-31] 30 (04/10 1000) BP: (122-175)/(59-94) 132/65 mmHg (04/10 1000) SpO2:  [90 %-100 %] 90 % (04/10 1000) FiO2 (%):  [60 %-80 %] 60 % (04/10 0949) Weight:  [179 lb 3.7 oz (81.3 kg)] 179 lb 3.7 oz (81.3 kg) (04/10 0500) VENTILATOR SETTINGS: Vent Mode:  [-] PRVC FiO2 (%):  [60 %-80 %] 60 % Set Rate:  [22 bmp-30 bmp] 30 bmp Vt Set:  [440 mL] 440 mL PEEP:  [14 cmH20] 14 cmH20 Plateau Pressure:  [25 cmH20-31 cmH20] 30 cmH20 INTAKE / OUTPUT: Intake/Output     04/09 0701 - 04/10 0700 04/10 0701 - 04/11 0700   I.V. (mL/kg) 2102.9 (25.9) 216.3 (2.7)   NG/GT 850 60   IV Piggyback 1940 100   Total Intake(mL/kg) 4892.9 (60.2) 376.3 (4.6)   Urine (mL/kg/hr) 2945 (1.5) 425  (1.5)   Total Output 2945 425   Net +1947.9 -48.7        Stool Occurrence 1 x      PHYSICAL EXAMINATION:  Gen: ill appearing HEENT: ETT in place PULM: coarse breath sounds with scattered wheeze, crepitus over chest CV: regular AB: soft, non tender Neuro: RASS -4, paralyzed Ext: 1+ edema Derm: no rashes   LABS:  CBC  Recent Labs Lab 10/05/13 0545 10/06/13 0500 10/07/13 0356  WBC 6.0 5.1 6.5  HGB 8.5* 8.3* 8.8*  HCT 24.9* 25.5* 27.0*  PLT 175 168 175   BMET  Recent Labs Lab 10/05/13 0545 10/06/13 0500 10/07/13 0356  NA 132* 135* 133*  K 4.5 4.5 4.7  CL 93* 94* 92*  CO2 28 32 33*  BUN 20 22 25*  CREATININE 0.70 0.70 0.63  GLUCOSE 151* 135* 145*   Electrolytes  Recent Labs Lab 10/05/13 0545 10/06/13 0500 10/07/13 0356  CALCIUM 7.8* 7.8* 8.0*  MG 2.0  --   --   PHOS 3.1  --   --    Sepsis Markers  Recent Labs Lab 10/05/2013 1019  LATICACIDVEN 1.56   ABG  Recent Labs Lab 10/06/13 0457 10/06/13 1950 10/06/13 2100  PHART 7.367 7.269* 7.369  PCO2ART 57.1* 77.2* 57.3*  PO2ART 89.6 93.2 75.3*   Liver Enzymes  Recent Labs Lab 10/02/2013 1005  AST 43*  ALT 47  ALKPHOS 53  BILITOT 0.2*  ALBUMIN 2.5*   Cardiac Enzymes  Recent Labs Lab 10/13/2013 1005  TROPONINI <0.30  PROBNP 460.5*   Glucose  Recent Labs Lab 10/06/13 1313 10/06/13 1652 10/06/13 2000 10/06/13 2353 10/07/13 0419 10/07/13 0747  GLUCAP 146* 135* 145* 139* 130* 138*    Imaging  Dg Abd 1 View  10/05/2013   CLINICAL DATA:  Gastric tube placement.  EXAM: ABDOMEN - 1 VIEW  COMPARISON:  10/02/2013  FINDINGS: Enteric tube has been advanced slightly with tip and side poor overlying the stomach in the left upper quadrant. Bowel gas pattern is nonobstructive. Remainder the exam is unchanged.  IMPRESSION: Nonobstructive bowel gas pattern.  Enteric tube with tip and side-port over the region of the stomach in the left upper quadrant.   Electronically Signed   By: Elberta Fortis  M.D.   On: 10/05/2013 22:12   Ct Chest W Contrast  10/06/2013   CLINICAL DATA:  HIV, pneumonia, pneumomediastinum.  EXAM: CT CHEST WITH CONTRAST  TECHNIQUE: Multidetector CT imaging of the chest was performed during intravenous contrast administration.  CONTRAST:  80mL OMNIPAQUE IOHEXOL 300 MG/ML  SOLN  COMPARISON:  DG CHEST 1V PORT dated 10/06/2013; CT ANGIO CHEST W/CM &/OR WO/CM dated 10/02/2013  FINDINGS: Extensive pneumomediastinum and subcutaneous emphysema, new since prior CT. Gas is most pronounced in the anterior mediastinum and in the anterior chest and lower neck. There is a small associated right pneumothorax, best seen inferiorly on images 48-51. No definite left pneumothorax.  Endotracheal tube tip is in the lower trachea. NG tube enters the stomach. Right central line tip is in the SVC.  Severe diffuse bilateral ground-glass areas of consolidation throughout both lungs. This is increased since prior study. Slight relative sparing in the apices and bases. No pleural effusions.  Small scattered mediastinal lymph nodes, none pathologically enlarged. No hilar or axillary adenopathy.  Imaging into the upper abdomen shows no acute findings.  IMPRESSION: Extensive pneumomediastinum and subcutaneous emphysema within the chest and visualized lower neck. Very small associated right pneumothorax, best seen at the right lung base. No definite left pneumothorax.  Severe diffuse ground-glass consolidation throughout the lungs bilaterally, worsening since prior study. This could reflect edema, hemorrhage or infection.   Electronically Signed   By: Charlett Nose M.D.   On: 10/06/2013 10:49   Dg Chest Port 1 View  10/07/2013   CLINICAL DATA:  Pneumonia.  EXAM: PORTABLE CHEST - 1 VIEW  COMPARISON:  DG CHEST 1V PORT dated 10/06/2013; CT CHEST W/CM dated 10/06/2013  FINDINGS: Endotracheal tube, NG tube, right IJ line in stable position. Dense bilateral airspace disease is again noted. Right pneumothorax is not well  appreciated as was primarily along the right base on prior CT of 10/06/2013. Extensive subcutaneous emphysema noted throughout the chest and neck. Pneumomediastinum. Stable cardiomegaly.  IMPRESSION: 1. Line and tube positions stable. 2. Dense bilateral pulmonary infiltrates unchanged. 3. Right pneumothorax not well appreciated as it was noted to be primarily along the of right lung base on prior CT of 10/06/2013. Prominent bilateral chest and neck subcutaneous emphysema is unchanged. Pneumomediastinum unchanged.   Electronically Signed   By: Maisie Fus  Register   On: 10/07/2013 07:20   Dg Chest Port 1 View  10/06/2013   CLINICAL DATA:  Check pneumothorax  EXAM: PORTABLE CHEST - 1 VIEW  COMPARISON:  CT from earlier in the same day  FINDINGS: Diffuse subcutaneous emphysema is noted. An endotracheal tube is again  seen 3.4 cm above the carina. A nasogastric catheter is noted within the stomach. The cardiac shadow is stable. Diffuse infiltrative changes are noted throughout both lungs. The previously seen pneumothorax on CT is not well appreciated on this exam as it was primarily basilar in appearance. Continued followup is recommended. A right-sided central venous line is seen.  IMPRESSION: Tubes and lines as described.  Bilateral subcutaneous emphysema as well as diffuse bilateral infiltrates. The known pneumothorax is not well appreciated on this exam as it was primarily basilar in location on the prior CT.   Electronically Signed   By: Alcide Clever M.D.   On: 10/06/2013 17:37   Dg Chest Port 1 View  10/06/2013   CLINICAL DATA:  Bilateral pneumonia.  EXAM: PORTABLE CHEST - 1 VIEW  COMPARISON:  DG CHEST 1V PORT dated 10/05/2013; DG ABDOMEN 1V dated 10/05/2013  FINDINGS: Endotracheal tube ends in the mid to lower thoracic trachea. Orogastric tube enters the stomach. Right IJ catheter, tip at the SVC.  New diffuse subcutaneous emphysema and pneumomediastinum. Possible trace left apical pneumothorax. There is extensive  bilateral pneumonia, extent similar to prior. No increasing pleural fluid. Stable heart size.  These results were called by telephone at the time of interpretation on 10/06/2013 at 6:29 AM to RN Denny Peon, who verbally acknowledged these results and is relaying message to on-call physician.  IMPRESSION: 1. New pneumomediastinum and subcutaneous emphysema. Possible, but not definite, trace left apical pneumothorax. Consider short followup chest x-ray. 2. Tubes and lines in unchanged position. 3. Unchanged extensive pneumonia.   Electronically Signed   By: Tiburcio Pea M.D.   On: 10/06/2013 06:34    ASSESSMENT / PLAN:  PULMONARY A:  Acute respiratory failure with ARDS 2nd to PNA in setting of HIV. Pneumomediastinum developed 4/09 >> doubt pneumothorax. P:   Wean PEEP, FiO2 per ARDS protocol Not ready for vent weaning >> likely will need trach next week Nimbex started 4/09 >> continue through 4/10, and re-assess on 4/11 Continue solumedrol 40 mg IV q8h with possible PCP and immune reconstitution Defer chest tube placements for now  INFECTIOUS A:   PNA in setting of HIV >> ?bacterial, viral, PCP. Stage II buttock ulcers. CMV viremia. P:   Abx, anti-viral per ID F/u Cx results  CARDIOVASCULAR A:  Severe sepsis. Hx of HTN. 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 P:   Monitor electrolytes, renal fx, urine outpt  GASTROINTESTINAL A:   Protein calorie malnutrition. P:   Protonix for SUP Tube feeds while on vent  HEMATOLOGIC A:   Anemia of critical illness and chronic disease. P:  F/u CBC Lovenox for DVT prevention  ENDOCRINE A:   Steroid induced hyperglycemia. P:   SSI  NEUROLOGIC A:    Anxiety, pain control, sedation. P:   Continue klonopin Continue diprivan, fentanyl gtt with prn versed Goal RASS -4 while on paralytic  Updated family at bedside.  Discussed possibility of needing tracheostomy next week, and that he will likey  require long process to recover.  CC time 35 minutes.  Coralyn Helling, MD [facility] Pulmonary/Critical Care 10/07/2013, 10:28 AM Pager:  [phone] After 3pm call: [phone]

## 2013-10-07 NOTE — Progress Notes (Addendum)
NUTRITION FOLLOW UP  Intervention:   - Will increase TF (Oxepa) to 30 ml/hr.  Decrease Prostat to 30 ml qid.  Propofol continues at 27.5 ml/hr.  This provides:  2206 kcal, 105 gm protein, 565 ml free water daily.  (101% estimated kcal needs, 100% estimated protein needs) - Will continue to monitor   Nutrition Dx:   Inadequate oral intake related to inability to eat as evidenced by NPO - ongoing   Goal:   TF regimen to meet >/= 90% of their estimated nutrition needs - met   Monitor:   Weights, labs, TF tolerance, vent status   Assessment:   48 y/o male recently diagnosed with HIV and treated empirically for PCP returned to the [facility] ED on 4/4 with acute hypoxemic respiratory failure.   4/5 - Pt intubated 4/5. Per family, pt has lost ~15-20 lbs over the past few weeks. He is using nutritional supplements at home (Boost or Ensure.). Received consult for TF management, RD ordered Vital AF 1.2 @ 20 ml/hr via OG tube and increase by 10 ml every 4 hours to goal rate of 80 ml/hr. At goal rate, tube feeding regimen will provide 2304 kcal, 103 grams of protein, and 1549 ml of H2O. Goal rate meets 100% of estimated calorie and protein needs.   4/7 - Pt started on ARDS protocol 4/6. Per RN, pt tolerating Vital AF 1.2 at goal rate of 30m/hr with 365mof residuals this morning and 30-4060mesiduals overnight. Remains intubated.   4/10 - Patient remains intubated.  Receiving Oxepa at 20 ml/hr plus prostat and tolerating well. Propofol: 27.5 ml/hr provides 726 calories continues  MV: 13.6 L/min Temp (24hrs), Avg:98.9 F (37.2 C), Min:96.8 F (36 C), Max:100.4 F (38 C)    Height: Ht Readings from Last 1 Encounters:  10/04/13 _0  (1.778 m)    Weight Status:   Wt Readings from Last 1 Encounters:  10/07/13 179 lb 3.7 oz (81.3 kg)  Admit wt:        155 lb (70.3 kg) Net I/Os: +6.4L  Re-estimated needs:  Kcal: 2188 Protein: 105-125g Fluid: >1.9L/day  Skin: Generalized edema, stage 2  sacral pressure ulcer and on right lateral side   Diet Order: NPO   Intake/Output Summary (Last 24 hours) at 10/07/13 1235 Last data filed at 10/07/13 1200  Gross per 24 hour  Intake 4492.93 ml  Output   2755 ml  Net 1737.93 ml    Last BM: 4/3   Labs:   Recent Labs Lab 10/04/13 0500 10/05/13 0545 10/06/13 0500 10/07/13 0356  NA 132* 132* 135* 133*  K 4.4 4.5 4.5 4.7  CL 96 93* 94* 92*  CO2 26 28 32 33*  BUN _1 25*  CREATININE 0.72 0.70 0.70 0.63  CALCIUM 7.8* 7.8* 7.8* 8.0*  MG  --  2.0  --   --   PHOS  --  3.1  --   --   GLUCOSE 202* 151* 135* 145*    CBG (last 3)   Recent Labs  10/06/13 2353 10/07/13 0419 10/07/13 0747  GLUCAP 139* 130* 138*    Scheduled Meds: . antiseptic oral rinse  15 mL Mouth Rinse QID  . artificial tears  1 application Both Eyes 3 times per day  . aspirin  81 mg Oral Daily  . azithromycin  1,200 mg Oral Weekly  . ceFEPime (MAXIPIME) IV  1 g Intravenous 3 times per day  . chlorhexidine  15 mL Mouth Rinse BID  .  clonazePAM  1 mg Oral BID  . docusate  100 mg Oral Daily  . dolutegravir  50 mg Oral Daily  . emtricitabine-tenofovir  1 tablet Oral Daily  . enoxaparin (LOVENOX) injection  40 mg Subcutaneous Q24H  . feeding supplement (OXEPA)  1,000 mL Per Tube Q24H  . feeding supplement (PRO-STAT SUGAR FREE 64)  60 mL Per Tube TID  . ganciclovir (CYTOVENE) IV  5 mg/kg Intravenous Q12H  . insulin aspart  0-15 Units Subcutaneous 6 times per day  . methylPREDNISolone (SOLU-MEDROL) injection  40 mg Intravenous 3 times per day  . pantoprazole sodium  40 mg Per Tube Daily  . sulfamethoxazole-trimethoprim  450 mg Intravenous Q8H    Continuous Infusions: . cisatracurium (NIMBEX) infusion 3 mcg/kg/min (10/07/13 0948)  . fentaNYL infusion INTRAVENOUS 300 mcg/hr (10/07/13 1114)  . propofol 60 mcg/kg/min (10/07/13 1113)     Antonieta Iba, RD, LDN Clinical Inpatient Dietitian Pager:  [phone] Weekend and after hours pager:   [phone]

## 2013-10-07 NOTE — Progress Notes (Signed)
[facility]  ID: 48 yo M newly diagnosed HIV, with presumed PCP PNA +/-IRIS vs HCAP, also has low level cmv viremia   Subjective: Remains intubated. Increased agitation and increased RR , added paralytics last night. Isolated temp to 100.4 yesterday  Medications: Abtx: #7cefepime, #7 bactrim(#23 since prior admit), #5 ganciclovir  Scheduled Meds: . antiseptic oral rinse  15 mL Mouth Rinse QID  . artificial tears  1 application Both Eyes 3 times per day  . aspirin  81 mg Oral Daily  . azithromycin  1,200 mg Oral Weekly  . ceFEPime (MAXIPIME) IV  1 g Intravenous 3 times per day  . chlorhexidine  15 mL Mouth Rinse BID  . clonazePAM  1 mg Oral BID  . docusate  100 mg Oral Daily  . dolutegravir  50 mg Oral Daily  . emtricitabine-tenofovir  1 tablet Oral Daily  . enoxaparin (LOVENOX) injection  40 mg Subcutaneous Q24H  . feeding supplement (OXEPA)  1,000 mL Per Tube Q24H  . feeding supplement (PRO-STAT SUGAR FREE 64)  30 mL Per Tube QID  . ganciclovir (CYTOVENE) IV  5 mg/kg Intravenous Q12H  . insulin aspart  0-15 Units Subcutaneous 6 times per day  . methylPREDNISolone (SOLU-MEDROL) injection  40 mg Intravenous 3 times per day  . pantoprazole sodium  40 mg Per Tube Daily  . sulfamethoxazole-trimethoprim  450 mg Intravenous Q8H   Continuous Infusions: . cisatracurium (NIMBEX) infusion 3 mcg/kg/min (10/07/13 0948)  . fentaNYL infusion INTRAVENOUS 300 mcg/hr (10/07/13 1114)  . propofol 60 mcg/kg/min (10/07/13 1113)     Objective: Weight change: 10.6 oz (0.3 kg)  Intake/Output Summary (Last 24 hours) at 10/07/13 1310 Last data filed at 10/07/13 1200  Gross per 24 hour  Intake 4370.83 ml  Output   2755 ml  Net 1615.83 ml   Blood pressure 131/73, pulse 86, temperature 96.8 F (36 C), temperature source Axillary, resp. rate 27, height 5\' 10"  (1.778 m), weight 179 lb 3.7 oz (81.3 kg), SpO2 89.00%. Temp:  [96.8 F (36 C)-100.4 F (38 C)] 96.8 F (36  C) (04/10 0800) Pulse Rate:  [77-100] 86 (04/10 1200) Resp:  [16-31] 27 (04/10 1200) BP: (122-175)/(59-94) 131/73 mmHg (04/10 1200) SpO2:  [89 %-98 %] 89 % (04/10 1200) FiO2 (%):  [60 %-80 %] 60 % (04/10 1250) Weight:  [179 lb 3.7 oz (81.3 kg)] 179 lb 3.7 oz (81.3 kg) (04/10 0500)  Physical Exam: General: sedated,  on ventilator  HEENT:  EOMI  CVS tachycardic ,  no murmur rubs or gallops Chest:rhonchi anteriorly Chest wall: crepitus on anterior chest wall bilaterally Abdomen: soft nontender, nondistended, normal bowel sounds, Extremities: no  clubbing or edema noted bilaterally Neuro: nonfocal  CBC:   Recent Labs  10/06/13 0500 10/07/13 0356  WBC 5.1 6.5  HGB 8.3* 8.8*  HCT 25.5* 27.0*  NA 135* 133*  K 4.5 4.7  CL 94* 92*  CO2 32 33*  BUN 22 25*  CREATININE 0.70 0.63     BMET  Recent Labs  10/06/13 0500 10/07/13 0356  NA 135* 133*  K 4.5 4.7  CL 94* 92*  CO2 32 33*  GLUCOSE 135* 145*  BUN 22 25*  CREATININE 0.70 0.63  CALCIUM 7.8* 8.0*     Micro Results: Recent Results (from the past 240 hour(s))  CULTURE, BLOOD (ROUTINE X 2)     Status: None   Collection Time    2014-02-11 10:20 AM      Result  Value Ref Range Status   Specimen Description BLOOD LEFT ANTECUBITAL   Final   Special Requests BOTTLES DRAWN AEROBIC AND ANAEROBIC 5 CC EACH   Final   Culture  Setup Time     Final   Value: 10/09/2013 14:52     Performed at [facility]   Culture     Final   Value: NO GROWTH 5 DAYS     Performed at [facility]   Report Status 10/07/2013 FINAL   Final  CULTURE, BLOOD (ROUTINE X 2)     Status: None   Collection Time    10/03/2013 10:35 AM      Result Value Ref Range Status   Specimen Description BLOOD RIGHT ANTECUBITAL   Final   Special Requests BOTTLES DRAWN AEROBIC AND ANAEROBIC [facility] EACH   Final   Culture  Setup Time     Final   Value: 10/19/2013 14:52     Performed at [facility]   Culture     Final   Value: NO GROWTH 5  DAYS     Performed at [facility]   Report Status 10/07/2013 FINAL   Final  PNEUMOCYSTIS JIROVECI SMEAR BY DFA     Status: None   Collection Time    10/02/13 10:08 AM      Result Value Ref Range Status   Specimen Source-[facility] BRONCHIAL ALVEOLAR LAVAGE   Corrected   Comment: CORRECTED ON 04/05 AT 1024: PREVIOUSLY REPORTED AS BRONCHIAL WASHINGS   Pneumocystis jiroveci Ag NEGATIVE   Final   Comment: Performed at [facility]  CULTURE, RESPIRATORY (NON-EXPECTORATED)     Status: None   Collection Time    10/02/13 10:08 AM      Result Value Ref Range Status   Specimen Description BRONCHIAL ALVEOLAR LAVAGE   Final   Special Requests Immunocompromised   Final   Gram Stain     Final   Value: RARE WBC PRESENT, PREDOMINANTLY PMN     RARE SQUAMOUS EPITHELIAL CELLS PRESENT     NO ORGANISMS SEEN     Performed at [facility]   Culture     Final   Value: NO GROWTH 2 DAYS     Performed at [facility]   Report Status 10/04/2013 FINAL   Final  FUNGUS CULTURE W SMEAR     Status: None   Collection Time    10/02/13 10:08 AM      Result Value Ref Range Status   Specimen Description BRONCHIAL ALVEOLAR LAVAGE   Final   Special Requests Immunocompromised   Final   Fungal Smear     Final   Value: NO YEAST OR FUNGAL ELEMENTS SEEN     Performed at [facility]   Culture     Final   Value: CULTURE IN PROGRESS FOR FOUR WEEKS     Performed at [facility]   Report Status PENDING   Incomplete  AFB CULTURE WITH SMEAR     Status: None   Collection Time    10/02/13 10:08 AM      Result Value Ref Range Status   Specimen Description BRONCHIAL ALVEOLAR LAVAGE   Final   Special Requests Immunocompromised   Final   ACID FAST SMEAR     Final   Value: NO ACID FAST BACILLI SEEN     Performed at [facility]   Culture     Final   Value: CULTURE WILL BE  EXAMINED FOR 6 WEEKS BEFORE ISSUING A FINAL REPORT     Performed at [facility]    Report Status PENDING   Incomplete  CMV CULTURE CMVC     Status: None   Collection Time    10/02/13 10:08 AM      Result Value Ref Range Status   Specimen Description BRONCHIAL ALVEOLAR LAVAGE   Final   Special Requests Immunocompromised   Final   Culture     Final   Value: No Cytomegalovirus identified in cell culture                                                                A negative result does not exclude the possibility of CMV infection;inappropriate specimen collection,storage and transport may lead to false      negative results.     Performed at [facility]   Report Status 10/05/2013 FINAL   Final    Studies/Results: Dg Abd 1 View  10/05/2013   CLINICAL DATA:  Gastric tube placement.  EXAM: ABDOMEN - 1 VIEW  COMPARISON:  10/02/2013  FINDINGS: Enteric tube has been advanced slightly with tip and side poor overlying the stomach in the left upper quadrant. Bowel gas pattern is nonobstructive. Remainder the exam is unchanged.  IMPRESSION: Nonobstructive bowel gas pattern.  Enteric tube with tip and side-port over the region of the stomach in the left upper quadrant.   Electronically Signed   By: Elberta Fortis M.D.   On: 10/05/2013 22:12   Ct Chest W Contrast  10/06/2013   CLINICAL DATA:  HIV, pneumonia, pneumomediastinum.  EXAM: CT CHEST WITH CONTRAST  TECHNIQUE: Multidetector CT imaging of the chest was performed during intravenous contrast administration.  CONTRAST:  80mL OMNIPAQUE IOHEXOL 300 MG/ML  SOLN  COMPARISON:  DG CHEST 1V PORT dated 10/06/2013; CT ANGIO CHEST W/CM &/OR WO/CM dated 10/18/2013  FINDINGS: Extensive pneumomediastinum and subcutaneous emphysema, new since prior CT. Gas is most pronounced in the anterior mediastinum and in the anterior chest and lower neck. There is a small associated right pneumothorax, best seen inferiorly on images 48-51. No definite left pneumothorax.  Endotracheal tube tip is in the lower trachea. NG tube enters the stomach. Right central  line tip is in the SVC.  Severe diffuse bilateral ground-glass areas of consolidation throughout both lungs. This is increased since prior study. Slight relative sparing in the apices and bases. No pleural effusions.  Small scattered mediastinal lymph nodes, none pathologically enlarged. No hilar or axillary adenopathy.  Imaging into the upper abdomen shows no acute findings.  IMPRESSION: Extensive pneumomediastinum and subcutaneous emphysema within the chest and visualized lower neck. Very small associated right pneumothorax, best seen at the right lung base. No definite left pneumothorax.  Severe diffuse ground-glass consolidation throughout the lungs bilaterally, worsening since prior study. This could reflect edema, hemorrhage or infection.   Electronically Signed   By: Charlett Nose M.D.   On: 10/06/2013 10:49   Dg Chest Port 1 View  10/07/2013   CLINICAL DATA:  Pneumonia.  EXAM: PORTABLE CHEST - 1 VIEW  COMPARISON:  DG CHEST 1V PORT dated 10/06/2013; CT CHEST W/CM dated 10/06/2013  FINDINGS: Endotracheal tube, NG tube, right IJ line in stable position. Dense bilateral airspace disease is again  noted. Right pneumothorax is not well appreciated as was primarily along the right base on prior CT of 10/06/2013. Extensive subcutaneous emphysema noted throughout the chest and neck. Pneumomediastinum. Stable cardiomegaly.  IMPRESSION: 1. Line and tube positions stable. 2. Dense bilateral pulmonary infiltrates unchanged. 3. Right pneumothorax not well appreciated as it was noted to be primarily along the of right lung base on prior CT of 10/06/2013. Prominent bilateral chest and neck subcutaneous emphysema is unchanged. Pneumomediastinum unchanged.   Electronically Signed   By: Maisie Fus  Register   On: 10/07/2013 07:20   Dg Chest Port 1 View  10/06/2013   CLINICAL DATA:  Check pneumothorax  EXAM: PORTABLE CHEST - 1 VIEW  COMPARISON:  CT from earlier in the same day  FINDINGS: Diffuse subcutaneous emphysema is noted. An  endotracheal tube is again seen 3.4 cm above the carina. A nasogastric catheter is noted within the stomach. The cardiac shadow is stable. Diffuse infiltrative changes are noted throughout both lungs. The previously seen pneumothorax on CT is not well appreciated on this exam as it was primarily basilar in appearance. Continued followup is recommended. A right-sided central venous line is seen.  IMPRESSION: Tubes and lines as described.  Bilateral subcutaneous emphysema as well as diffuse bilateral infiltrates. The known pneumothorax is not well appreciated on this exam as it was primarily basilar in location on the prior CT.   Electronically Signed   By: Alcide Clever M.D.   On: 10/06/2013 17:37   Dg Chest Port 1 View  10/06/2013   CLINICAL DATA:  Bilateral pneumonia.  EXAM: PORTABLE CHEST - 1 VIEW  COMPARISON:  DG CHEST 1V PORT dated 10/05/2013; DG ABDOMEN 1V dated 10/05/2013  FINDINGS: Endotracheal tube ends in the mid to lower thoracic trachea. Orogastric tube enters the stomach. Right IJ catheter, tip at the SVC.  New diffuse subcutaneous emphysema and pneumomediastinum. Possible trace left apical pneumothorax. There is extensive bilateral pneumonia, extent similar to prior. No increasing pleural fluid. Stable heart size.  These results were called by telephone at the time of interpretation on 10/06/2013 at 6:29 AM to RN Denny Peon, who verbally acknowledged these results and is relaying message to on-call physician.  IMPRESSION: 1. New pneumomediastinum and subcutaneous emphysema. Possible, but not definite, trace left apical pneumothorax. Consider short followup chest x-ray. 2. Tubes and lines in unchanged position. 3. Unchanged extensive pneumonia.   Electronically Signed   By: Tiburcio Pea M.D.   On: 10/06/2013 06:34      Assessment/Plan:  Principal Problem:   Pneumonia Active Problems:   Acute respiratory failure   AIDS   PCP (pneumocystis carinii pneumonia)   CMV (cytomegalovirus)   Sacral decubitus  ulcer, stage II   Hyponatremia   Pneumomediastinum   1)Presumed PCP PNA:  Continue with IV bactrim and high dose steroids. Currently on day #7 ( 23 day course of therapy). DFA stains are negative, but technically collected after 2 wks of bactrim therapy. Will check fungitell assay. occ do see discordance of isolation of fungitell+ with negative DFA from sampling. Will continue at high dose bactrim. He has developed complications of pneumomediastinum which is occasionally seen with PCP  2) concurrent CAP: currently on day 7 of 7 with cefepime. Thus far cx remain negative. Today is last day of cefepime  3)  HIV/AIDS: currently onTivicay and Truvada to accomodate for intubation and mode of delivery of meds. Will check viral load and cd 4 count  4) oi proph: continue weekly prophhylactic azithromycin  5) cmv  viremia =   Continue with ganciclovir for now. No leukopenia SE thus far. If clinically worsens, Dr. Allyne Gee to come to do inpatient eval. Repeat cmv vl is pending  6) ARDS 2/2 PCP, possible immune reactivation. Continue with pulm management of vents and steroids for now. Agree with getting trach next week in order to help minimize sedation   Spent with greater than 50% discussion with family and coordination of care   LOS: 6 days   Judyann Munson 10/07/2013, 1:10 PM

## 2013-10-07 NOTE — Plan of Care (Signed)
Problem: Phase I Progression Outcomes Goal: Patient tolerating nututrition at goal Outcome: Progressing Oxepa increased to 4730ml's/hr.  Pt tolerating tubefeeding. Goal: Progressing towards optiumm acitivities Outcome: Not Progressing Pt currently sedated and paralyzed to optimize oxygenation and Ventilation on Ventilator. Goal: Tracheostomy by Vent Day 14 Outcome: Progressing Dr. Craige CottaSood discussed possibility of patient needing Trach placed in near future.  Family asking appropriate questions regarding plan of care and what to expect. Goal: Initial discharge plan identified Outcome: Not Progressing Critical Care management at this time.

## 2013-10-07 NOTE — Progress Notes (Signed)
10/07/13 1215  Clinical Encounter Type  Visited With Family  Visit Type Follow-up;Spiritual support;Social support  Spiritual Encounters  Spiritual Needs Emotional   Visited with wife Caryn in the cafeteria.  She states that "trying to stay positive is getting too exhausting.  So now I am preparing for the worst and hoping for the best.  I just don't know what is going to happen."  She reports sharing this uncertainty with their children Lillia AbedLindsay (almost 17) and Luke (12), both of whom are planning to visit this evening.  Caryn also states that the booklet of interfaith prayers for healing (which I brought this morning per pt's parents' request for written healing prayers) has been very helpful, and especially meaningful because Kipp BroodBrent has been such a Hotel managerspiritual seeker.  She is aware that she may request chaplain visits as needed, and we talked specifically about supporting her children (and supporting her as she supports her children) as their spiritual/emotional needs become more clear.  Please page [phone] as needed.  Thank you.  [address]Chaplain Aurelio Mccamy [place], [place] [phone]

## 2013-10-07 NOTE — Progress Notes (Signed)
10/07/13 1030  Clinical Encounter Type  Visited With Family (parents (in town from [place]  Visit Type Follow-up;Spiritual support;Social support  Spiritual Encounters  Spiritual Needs Emotional;Literature;Prayer   Visited with Rashed's parents twice this morning, providing pastoral presence, prayer at bedside, emotional support and encouragement, and prayer/devotional booklets per their request.  Family very appreciative of ongoing pastoral presence.  Please also page as needs arise:  [phone].  Thank you.  [address]Chaplain Larance Rat[place], [place] [phone]

## 2013-10-07 NOTE — Progress Notes (Signed)
eLink Physician-Brief Progress Note Patient Name: Tennis MustBrent Diggins DOB: 07/05/1965 MRN: [id number]  Date of Service  10/07/2013   HPI/Events of Note   Hypoemia Fluid balance 11 L positive Hypertensive   eICU Interventions   Lasix 40 mg q6h   Intervention Category Major Interventions: Hypoxemia - evaluation and management  Giovana Faciane 10/07/2013, 3:23 PM

## 2013-10-08 ENCOUNTER — Inpatient Hospital Stay (HOSPITAL_COMMUNITY): Payer: BC Managed Care – PPO

## 2013-10-08 DIAGNOSIS — F4323 Adjustment disorder with mixed anxiety and depressed mood: Secondary | ICD-10-CM

## 2013-10-08 LAB — BASIC METABOLIC PANEL
BUN: 30 mg/dL — ABNORMAL HIGH (ref 6–23)
CO2: 34 meq/L — AB (ref 19–32)
CREATININE: 0.71 mg/dL (ref 0.50–1.35)
Calcium: 7.6 mg/dL — ABNORMAL LOW (ref 8.4–10.5)
Chloride: 88 mEq/L — ABNORMAL LOW (ref 96–112)
GFR calc Af Amer: >90 mL/min (ref >90)
GFR calc non Af Amer: >90 mL/min (ref >90)
Glucose, Bld: 156 mg/dL — ABNORMAL HIGH (ref 70–99)
Potassium: 4.5 mEq/L (ref 3.7–5.3)
Sodium: 131 mEq/L — ABNORMAL LOW (ref 137–147)

## 2013-10-08 LAB — BLOOD GAS, ARTERIAL
Acid-Base Excess: 9.6 mmol/L — ABNORMAL HIGH (ref 0.0–2.0)
Acid-Base Excess: 11.4 mmol/L — ABNORMAL HIGH (ref 0.0–2.0)
BICARBONATE: 34.2 meq/L — AB (ref 20.0–24.0)
Bicarbonate: 37.9 mEq/L — ABNORMAL HIGH (ref 20.0–24.0)
Drawn by: 317871
FIO2: 0.7 %
FIO2: 0.7 %
LHR: 26 {breaths}/min
MECHVT: 440 mL
O2 Saturation: 97.3 %
O2 Saturation: 89.3 %
PATIENT TEMPERATURE: 98.6
PCO2 ART: 49.3 mmHg — AB (ref 35.0–45.0)
PCO2 ART: 60.2 mmHg — AB (ref 35.0–45.0)
PEEP/CPAP: 14 cmH2O
PEEP/CPAP: 8 cmH2O
PH ART: 7.458 — AB (ref 7.350–7.450)
PH ART: 7.415 (ref 7.350–7.450)
PO2 ART: 175 mmHg — AB (ref 80.0–100.0)
Patient temperature: 99.3
RATE: 30 resp/min
TCO2: 31.8 mmol/L (ref 0–100)
TCO2: 34.3 mmol/L (ref 0–100)
VT: 440 mL
pO2, Arterial: 67 mmHg — ABNORMAL LOW (ref 80.0–100.0)

## 2013-10-08 LAB — CBC
HEMATOCRIT: 27 % — AB (ref 39.0–52.0)
Hemoglobin: 8.8 g/dL — ABNORMAL LOW (ref 13.0–17.0)
MCH: 29.7 pg (ref 26.0–34.0)
MCHC: 32.6 g/dL (ref 30.0–36.0)
MCV: 91.2 fL (ref 78.0–100.0)
Platelets: 192 10*3/uL (ref 150–400)
RBC: 2.96 MIL/uL — ABNORMAL LOW (ref 4.22–5.81)
RDW: 14.9 % (ref 11.5–15.5)
WBC: 7 10*3/uL (ref 4.0–10.5)

## 2013-10-08 LAB — GLUCOSE, CAPILLARY
GLUCOSE-CAPILLARY: 176 mg/dL — AB (ref 70–99)
GLUCOSE-CAPILLARY: 161 mg/dL — AB (ref 70–99)
GLUCOSE-CAPILLARY: 123 mg/dL — AB (ref 70–99)
Glucose-Capillary: 137 mg/dL — ABNORMAL HIGH (ref 70–99)
Glucose-Capillary: 154 mg/dL — ABNORMAL HIGH (ref 70–99)

## 2013-10-08 LAB — CMV (CYTOMEGALOVIRUS) DNA ULTRAQUANT, PCR: CMV DNA QUANT: 78603 {copies}/mL — AB

## 2013-10-08 MED ORDER — DEXTROSE 5 % IV SOLN
1.0000 g | Freq: Three times a day (TID) | INTRAVENOUS | Status: DC
  Administered 2013-10-08 – 2013-10-11 (×10): 1 g via INTRAVENOUS
  Filled 2013-10-08 (×12): qty 1

## 2013-10-08 NOTE — Progress Notes (Signed)
PULMONARY / CRITICAL CARE MEDICINE   Name: William Bender MRN: [id number] DOB: May 31, 1966    ADMISSION DATE:  10/19/2013 CONSULTATION DATE:  10/20/2013  REFERRING MD :  Rancour PRIMARY SERVICE: PCCM  CHIEF COMPLAINT:  Shortness of breath, fever, cough  BRIEF PATIENT DESCRIPTION: 48 y/o male recently diagnosed with HIV/AIDS -CD4 =30 on 3/31 and treated empirically for PCP returned to the [facility] ED on 4/4 with acute hypoxemic respiratory failure. Note that sputum PCP was neg 3/18 but was on bactrim prior  SIGNIFICANT EVENTS: 4/09 Pneumomediastinum, nimbex added 4/10- desat, lasix started, peep improved to 10  STUDIES:  4/4 CT angio > no PE, diffuse bilateral GGO and patchy consolidation worse in the bases L > R 4/9 CT chest > pneumomediastinum with subcutaneous emphysema, severe diffuse GGO b/l  LINES / TUBES: 4/5 ETT >> 4/5 R IJ CVL >>  CULTURES: 4/4 blood >> negative 4/4 urine hist >> negative 4/4 CMV pcr >> POSITIVE 4/5 PCP DFA BAL LUL >>negative 4/5 resp culture bal >>negative 4/5 fungal  bal >> 4/5 afb  bal >> 4/5 cmv bal >> negative  ANTIBIOTICS: Per ID 4/4 Bactrim >> 4/4 Vanc >> 4/4 4/4 Cefepime >> Gancyclovir>>>  SUBJECTIVE:  Peep better  VITAL SIGNS: Temp:  [97.3 F (36.3 C)-101.5 F (38.6 C)] 97.3 F (36.3 C) (04/11 0800) Pulse Rate:  [81-122] 94 (04/11 0500) Resp:  [15-30] 30 (04/11 0500) BP: (95-173)/(54-91) 108/65 mmHg (04/11 0500) SpO2:  [86 %-98 %] 94 % (04/11 0500) FiO2 (%):  [60 %-80 %] 60 % (04/11 0902) Weight:  [82.9 kg (182 lb 12.2 oz)] 82.9 kg (182 lb 12.2 oz) (04/11 0427) VENTILATOR SETTINGS: Vent Mode:  [-] PRVC FiO2 (%):  [60 %-80 %] 60 % Set Rate:  [30 bmp] 30 bmp Vt Set:  [440 mL] 440 mL PEEP:  [10 cmH20-14 cmH20] 10 cmH20 Plateau Pressure:  [30 cmH20-34 cmH20] 30 cmH20 INTAKE / OUTPUT: Intake/Output     04/10 0701 - 04/11 0700 04/11 0701 - 04/12 0700   I.V. (mL/kg) 1734.7 (20.9)    NG/GT 590    IV Piggyback 1358.1    Total  Intake(mL/kg) 3682.8 (44.4)    Urine (mL/kg/hr) 6195 (3.1)    Total Output 6195     Net -2512.2            PHYSICAL EXAMINATION:  Gen: ill appearing, paralyzed HEENT: ETT in place PULM: coarse breath sounds, crepitus anterior neck CV: s1 s 2 regular, r AB: soft, non tender Neuro: RASS -4, paralyzed Ext: 1+ edema Derm: no rashes   LABS:  CBC  Recent Labs Lab 10/06/13 0500 10/07/13 0356 10/08/13 0400  WBC 5.1 6.5 7.0  HGB 8.3* 8.8* 8.8*  HCT 25.5* 27.0* 27.0*  PLT 168 175 192   BMET  Recent Labs Lab 10/06/13 0500 10/07/13 0356 10/08/13 0400  NA 135* 133* 131*  K 4.5 4.7 4.5  CL 94* 92* 88*  CO2 32 33* 34*  BUN 22 25* 30*  CREATININE 0.70 0.63 0.71  GLUCOSE 135* 145* 156*   Electrolytes  Recent Labs Lab 10/05/13 0545 10/06/13 0500 10/07/13 0356 10/08/13 0400  CALCIUM 7.8* 7.8* 8.0* 7.6*  MG 2.0  --   --   --   PHOS 3.1  --   --   --    Sepsis Markers  Recent Labs Lab 10/15/2013 1019  LATICACIDVEN 1.56   ABG  Recent Labs Lab 10/06/13 1950 10/06/13 2100 10/08/13 0355  PHART 7.269* 7.369 7.458*  PCO2ART 77.2* 57.3*  49.3*  PO2ART 93.2 75.3* 175.0*   Liver Enzymes  Recent Labs Lab 10/16/2013 1005  AST 43*  ALT 47  ALKPHOS 53  BILITOT 0.2*  ALBUMIN 2.5*   Cardiac Enzymes  Recent Labs Lab 10/15/2013 1005  TROPONINI <0.30  PROBNP 460.5*   Glucose  Recent Labs Lab 10/07/13 1526 10/07/13 1926 10/07/13 2022 10/08/13 0016 10/08/13 0442 10/08/13 0811  GLUCAP 129* 147* 137* 154* 176* 161*    Imaging  Ct Chest W Contrast  10/06/2013   CLINICAL DATA:  HIV, pneumonia, pneumomediastinum.  EXAM: CT CHEST WITH CONTRAST  TECHNIQUE: Multidetector CT imaging of the chest was performed during intravenous contrast administration.  CONTRAST:  67m OMNIPAQUE IOHEXOL 300 MG/ML  SOLN  COMPARISON:  DG CHEST 1V PORT dated 10/06/2013; CT ANGIO CHEST W/CM &/OR WO/CM dated 10/16/2013  FINDINGS: Extensive pneumomediastinum and subcutaneous emphysema,  new since prior CT. Gas is most pronounced in the anterior mediastinum and in the anterior chest and lower neck. There is a small associated right pneumothorax, best seen inferiorly on images 48-51. No definite left pneumothorax.  Endotracheal tube tip is in the lower trachea. NG tube enters the stomach. Right central line tip is in the SVC.  Severe diffuse bilateral ground-glass areas of consolidation throughout both lungs. This is increased since prior study. Slight relative sparing in the apices and bases. No pleural effusions.  Small scattered mediastinal lymph nodes, none pathologically enlarged. No hilar or axillary adenopathy.  Imaging into the upper abdomen shows no acute findings.  IMPRESSION: Extensive pneumomediastinum and subcutaneous emphysema within the chest and visualized lower neck. Very small associated right pneumothorax, best seen at the right lung base. No definite left pneumothorax.  Severe diffuse ground-glass consolidation throughout the lungs bilaterally, worsening since prior study. This could reflect edema, hemorrhage or infection.   Electronically Signed   By: KRolm BaptiseM.D.   On: 10/06/2013 10:49   Dg Chest Port 1 View  10/08/2013   CLINICAL DATA:  Respiratory distress syndrome.  EXAM: PORTABLE CHEST - 1 VIEW  COMPARISON:  October 07, 2013.  FINDINGS: Endotracheal and nasogastric tubes are unchanged in position. Right internal jugular calf line is unchanged in position. Subcutaneous emphysema is seen bilaterally and is not significantly changed. Diffuse airspace opacities are again noted concerning for pneumonia or edema. No definite pneumothorax or pleural effusion is noted.  IMPRESSION: Stable bilateral lung opacities concerning for pneumonia or edema. Stable support apparatus. Bilateral subcutaneous emphysema is not significantly changed.   Electronically Signed   By: JSabino DickM.D.   On: 10/08/2013 08:19   Dg Chest Port 1 View  10/07/2013   CLINICAL DATA:  Pneumonia.  EXAM:  PORTABLE CHEST - 1 VIEW  COMPARISON:  DG CHEST 1V PORT dated 10/06/2013; CT CHEST W/CM dated 10/06/2013  FINDINGS: Endotracheal tube, NG tube, right IJ line in stable position. Dense bilateral airspace disease is again noted. Right pneumothorax is not well appreciated as was primarily along the right base on prior CT of 10/06/2013. Extensive subcutaneous emphysema noted throughout the chest and neck. Pneumomediastinum. Stable cardiomegaly.  IMPRESSION: 1. Line and tube positions stable. 2. Dense bilateral pulmonary infiltrates unchanged. 3. Right pneumothorax not well appreciated as it was noted to be primarily along the of right lung base on prior CT of 10/06/2013. Prominent bilateral chest and neck subcutaneous emphysema is unchanged. Pneumomediastinum unchanged.   Electronically Signed   By: TMarcello Moores Register   On: 10/07/2013 07:20   Dg Chest Port 1 View  10/06/2013  CLINICAL DATA:  Check pneumothorax  EXAM: PORTABLE CHEST - 1 VIEW  COMPARISON:  CT from earlier in the same day  FINDINGS: Diffuse subcutaneous emphysema is noted. An endotracheal tube is again seen 3.4 cm above the carina. A nasogastric catheter is noted within the stomach. The cardiac shadow is stable. Diffuse infiltrative changes are noted throughout both lungs. The previously seen pneumothorax on CT is not well appreciated on this exam as it was primarily basilar in appearance. Continued followup is recommended. A right-sided central venous line is seen.  IMPRESSION: Tubes and lines as described.  Bilateral subcutaneous emphysema as well as diffuse bilateral infiltrates. The known pneumothorax is not well appreciated on this exam as it was primarily basilar in location on the prior CT.   Electronically Signed   By: Inez Catalina M.D.   On: 10/06/2013 17:37    ASSESSMENT / PLAN:  PULMONARY A:  Acute respiratory failure with ARDS 2nd to PNA in setting of HIV. Pneumomediastinum developed 4/09 P:   Nimbex will maintain as desats overnight  noted Continue solumedrol 40 mg IV q8h with possible PCP and immune reconstitution May require CT placement Would alter protocol to have 70% if able to get to peep 8 to reduce med air increase and PTX pcxr in am If decline = prone position consideration abg reviewed, reduce rate, repeat abg Plan trach Monday of peep 8 60% goals met abg in am  Neg balance goals, FACTT  INFECTIOUS A:   PNA in setting of HIV >> ?bacterial, viral, PCP. Stage II buttock ulcers. CMV viremia. P:   Abx, anti-viral per ID  CARDIOVASCULAR A:  Severe sepsis. Hx of HTN. 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 overloaded P:   Monitor electrolytes, renal fx, urine outpt Lasix remain  GASTROINTESTINAL A:   Protein calorie malnutrition. P:   Protonix for SUP Tube feeds to goal  HEMATOLOGIC A:   Anemia of critical illness and chronic disease. P:  F/u CBC Lovenox for DVT prevention, crt in am   ENDOCRINE A:   Steroid induced hyperglycemia. P:   SSI  NEUROLOGIC A:    Anxiety, pain control, sedation. P:   Continue klonopin Continue diprivan, fentanyl gtt with prn versed Goal RASS -4 while on paralytic May be able to dc paralysis in am   Updated family at bedside.   CC time 35 minutes. Lavon Paganini. Titus Mould, MD, [place] Pgr: [facility]

## 2013-10-08 NOTE — Progress Notes (Signed)
[facility]  ID: 48 yo M newly diagnosed HIV, with presumed PCP PNA +/-IRIS vs HCAP, also has low level cmv viremia   Subjective: Remains intubated. Increased agitation and increased RR , added paralytics last night. Febrile to 101.5 yesterday  Medications: Abtx:  #8 bactrim(#24 since prior admit), #6 ganciclovir,#8cefepime  Scheduled Meds: . antiseptic oral rinse  15 mL Mouth Rinse QID  . artificial tears  1 application Both Eyes 3 times per day  . aspirin  81 mg Oral Daily  . azithromycin  1,200 mg Oral Weekly  . chlorhexidine  15 mL Mouth Rinse BID  . clonazePAM  1 mg Oral BID  . docusate  100 mg Oral Daily  . dolutegravir  50 mg Oral Daily  . emtricitabine-tenofovir  1 tablet Oral Daily  . enoxaparin (LOVENOX) injection  40 mg Subcutaneous Q24H  . feeding supplement (OXEPA)  1,000 mL Per Tube Q24H  . feeding supplement (PRO-STAT SUGAR FREE 64)  30 mL Per Tube QID  . furosemide  40 mg Intravenous Q6H  . ganciclovir (CYTOVENE) IV  5 mg/kg Intravenous Q12H  . insulin aspart  0-15 Units Subcutaneous 6 times per day  . methylPREDNISolone (SOLU-MEDROL) injection  40 mg Intravenous 3 times per day  . pantoprazole sodium  40 mg Per Tube Daily  . sulfamethoxazole-trimethoprim  450 mg Intravenous Q8H   Continuous Infusions: . cisatracurium (NIMBEX) infusion 3.5 mcg/kg/min (10/08/13 0305)  . fentaNYL infusion INTRAVENOUS 300 mcg/hr (10/08/13 0330)  . propofol 60 mcg/kg/min (10/08/13 0919)     Objective: Weight change: 3 lb 8.4 oz (1.6 kg)  Intake/Output Summary (Last 24 hours) at 10/08/13 0935 Last data filed at 10/08/13 0531  Gross per 24 hour  Intake 3398.63 ml  Output   5770 ml  Net -2371.37 ml   Blood pressure 108/65, pulse 94, temperature 97.3 F (36.3 C), temperature source Axillary, resp. rate 30, height 5\' 10"  (1.778 m), weight 182 lb 12.2 oz (82.9 kg), SpO2 94.00%. Temp:  [97.3 F (36.3 C)-101.5 F (38.6 C)] 97.3 F (36.3 C) (04/11  0800) Pulse Rate:  [81-122] 94 (04/11 0500) Resp:  [15-30] 30 (04/11 0500) BP: (95-173)/(54-91) 108/65 mmHg (04/11 0500) SpO2:  [86 %-98 %] 94 % (04/11 0500) FiO2 (%):  [60 %-80 %] 60 % (04/11 0902) Weight:  [182 lb 12.2 oz (82.9 kg)] 182 lb 12.2 oz (82.9 kg) (04/11 0427)  Physical Exam: General: paralized,  on ventilator  HEENT: anterior neck crepitus CVS tachycardic ,  no murmur rubs or gallops Chest:rhonchi anteriorly Abdomen: soft nontender, nondistended, normal bowel sounds, Extremities: no  clubbing or edema noted bilaterally Neuro: nonfocal  CBC:   Recent Labs  10/07/13 0356 10/08/13 0400  WBC 6.5 7.0  HGB 8.8* 8.8*  HCT 27.0* 27.0*  NA 133* 131*  K 4.7 4.5  CL 92* 88*  CO2 33* 34*  BUN 25* 30*  CREATININE 0.63 0.71     BMET  Recent Labs  10/07/13 0356 10/08/13 0400  NA 133* 131*  K 4.7 4.5  CL 92* 88*  CO2 33* 34*  GLUCOSE 145* 156*  BUN 25* 30*  CREATININE 0.63 0.71  CALCIUM 8.0* 7.6*     Micro Results: Recent Results (from the past 240 hour(s))  CULTURE, BLOOD (ROUTINE X 2)     Status: None   Collection Time    08-07-13 10:20 AM      Result Value Ref Range Status   Specimen Description BLOOD LEFT ANTECUBITAL  Final   Special Requests BOTTLES DRAWN AEROBIC AND ANAEROBIC 5 CC EACH   Final   Culture  Setup Time     Final   Value: 14-Oct-2013 14:52     Performed at [facility]   Culture     Final   Value: NO GROWTH 5 DAYS     Performed at [facility]   Report Status 10/07/2013 FINAL   Final  CULTURE, BLOOD (ROUTINE X 2)     Status: None   Collection Time    10/14/13 10:35 AM      Result Value Ref Range Status   Specimen Description BLOOD RIGHT ANTECUBITAL   Final   Special Requests BOTTLES DRAWN AEROBIC AND ANAEROBIC [facility] EACH   Final   Culture  Setup Time     Final   Value: 2013/10/14 14:52     Performed at [facility]   Culture     Final   Value: NO GROWTH 5 DAYS     Performed at [facility]    Report Status 10/07/2013 FINAL   Final  PNEUMOCYSTIS JIROVECI SMEAR BY DFA     Status: None   Collection Time    10/02/13 10:08 AM      Result Value Ref Range Status   Specimen Source-[facility] BRONCHIAL ALVEOLAR LAVAGE   Corrected   Comment: CORRECTED ON 04/05 AT 1024: PREVIOUSLY REPORTED AS BRONCHIAL WASHINGS   Pneumocystis jiroveci Ag NEGATIVE   Final   Comment: Performed at [facility]  CULTURE, RESPIRATORY (NON-EXPECTORATED)     Status: None   Collection Time    10/02/13 10:08 AM      Result Value Ref Range Status   Specimen Description BRONCHIAL ALVEOLAR LAVAGE   Final   Special Requests Immunocompromised   Final   Gram Stain     Final   Value: RARE WBC PRESENT, PREDOMINANTLY PMN     RARE SQUAMOUS EPITHELIAL CELLS PRESENT     NO ORGANISMS SEEN     Performed at [facility]   Culture     Final   Value: NO GROWTH 2 DAYS     Performed at [facility]   Report Status 10/04/2013 FINAL   Final  FUNGUS CULTURE W SMEAR     Status: None   Collection Time    10/02/13 10:08 AM      Result Value Ref Range Status   Specimen Description BRONCHIAL ALVEOLAR LAVAGE   Final   Special Requests Immunocompromised   Final   Fungal Smear     Final   Value: NO YEAST OR FUNGAL ELEMENTS SEEN     Performed at [facility]   Culture     Final   Value: CULTURE IN PROGRESS FOR FOUR WEEKS     Performed at [facility]   Report Status PENDING   Incomplete  AFB CULTURE WITH SMEAR     Status: None   Collection Time    10/02/13 10:08 AM      Result Value Ref Range Status   Specimen Description BRONCHIAL ALVEOLAR LAVAGE   Final   Special Requests Immunocompromised   Final   ACID FAST SMEAR     Final   Value: NO ACID FAST BACILLI SEEN     Performed at [facility]   Culture     Final   Value: CULTURE WILL BE EXAMINED FOR 6 WEEKS BEFORE ISSUING A FINAL REPORT  Performed at [facility]   Report Status PENDING   Incomplete  CMV CULTURE  CMVC     Status: None   Collection Time    10/02/13 10:08 AM      Result Value Ref Range Status   Specimen Description BRONCHIAL ALVEOLAR LAVAGE   Final   Special Requests Immunocompromised   Final   Culture     Final   Value: No Cytomegalovirus identified in cell culture                                                                A negative result does not exclude the possibility of CMV infection;inappropriate specimen collection,storage and transport may lead to false      negative results.     Performed at [facility]   Report Status 10/05/2013 FINAL   Final    Studies/Results: Ct Chest W Contrast  10/06/2013   CLINICAL DATA:  HIV, pneumonia, pneumomediastinum.  EXAM: CT CHEST WITH CONTRAST  TECHNIQUE: Multidetector CT imaging of the chest was performed during intravenous contrast administration.  CONTRAST:  80mL OMNIPAQUE IOHEXOL 300 MG/ML  SOLN  COMPARISON:  DG CHEST 1V PORT dated 10/06/2013; CT ANGIO CHEST W/CM &/OR WO/CM dated 09/29/2013  FINDINGS: Extensive pneumomediastinum and subcutaneous emphysema, new since prior CT. Gas is most pronounced in the anterior mediastinum and in the anterior chest and lower neck. There is a small associated right pneumothorax, best seen inferiorly on images 48-51. No definite left pneumothorax.  Endotracheal tube tip is in the lower trachea. NG tube enters the stomach. Right central line tip is in the SVC.  Severe diffuse bilateral ground-glass areas of consolidation throughout both lungs. This is increased since prior study. Slight relative sparing in the apices and bases. No pleural effusions.  Small scattered mediastinal lymph nodes, none pathologically enlarged. No hilar or axillary adenopathy.  Imaging into the upper abdomen shows no acute findings.  IMPRESSION: Extensive pneumomediastinum and subcutaneous emphysema within the chest and visualized lower neck. Very small associated right pneumothorax, best seen at the right lung base. No definite  left pneumothorax.  Severe diffuse ground-glass consolidation throughout the lungs bilaterally, worsening since prior study. This could reflect edema, hemorrhage or infection.   Electronically Signed   By: Charlett Nose M.D.   On: 10/06/2013 10:49   Dg Chest Port 1 View  10/08/2013   CLINICAL DATA:  Respiratory distress syndrome.  EXAM: PORTABLE CHEST - 1 VIEW  COMPARISON:  October 07, 2013.  FINDINGS: Endotracheal and nasogastric tubes are unchanged in position. Right internal jugular calf line is unchanged in position. Subcutaneous emphysema is seen bilaterally and is not significantly changed. Diffuse airspace opacities are again noted concerning for pneumonia or edema. No definite pneumothorax or pleural effusion is noted.  IMPRESSION: Stable bilateral lung opacities concerning for pneumonia or edema. Stable support apparatus. Bilateral subcutaneous emphysema is not significantly changed.   Electronically Signed   By: Roque Lias M.D.   On: 10/08/2013 08:19   Dg Chest Port 1 View  10/07/2013   CLINICAL DATA:  Pneumonia.  EXAM: PORTABLE CHEST - 1 VIEW  COMPARISON:  DG CHEST 1V PORT dated 10/06/2013; CT CHEST W/CM dated 10/06/2013  FINDINGS: Endotracheal tube, NG tube, right IJ line in stable position. Dense  bilateral airspace disease is again noted. Right pneumothorax is not well appreciated as was primarily along the right base on prior CT of 10/06/2013. Extensive subcutaneous emphysema noted throughout the chest and neck. Pneumomediastinum. Stable cardiomegaly.  IMPRESSION: 1. Line and tube positions stable. 2. Dense bilateral pulmonary infiltrates unchanged. 3. Right pneumothorax not well appreciated as it was noted to be primarily along the of right lung base on prior CT of 10/06/2013. Prominent bilateral chest and neck subcutaneous emphysema is unchanged. Pneumomediastinum unchanged.   Electronically Signed   By: Maisie Fus  Register   On: 10/07/2013 07:20   Dg Chest Port 1 View  10/06/2013   CLINICAL DATA:   Check pneumothorax  EXAM: PORTABLE CHEST - 1 VIEW  COMPARISON:  CT from earlier in the same day  FINDINGS: Diffuse subcutaneous emphysema is noted. An endotracheal tube is again seen 3.4 cm above the carina. A nasogastric catheter is noted within the stomach. The cardiac shadow is stable. Diffuse infiltrative changes are noted throughout both lungs. The previously seen pneumothorax on CT is not well appreciated on this exam as it was primarily basilar in appearance. Continued followup is recommended. A right-sided central venous line is seen.  IMPRESSION: Tubes and lines as described.  Bilateral subcutaneous emphysema as well as diffuse bilateral infiltrates. The known pneumothorax is not well appreciated on this exam as it was primarily basilar in location on the prior CT.   Electronically Signed   By: Alcide Clever M.D.   On: 10/06/2013 17:37      Assessment/Plan:  Principal Problem:   Pneumonia Active Problems:   Acute respiratory failure   AIDS   PCP (pneumocystis carinii pneumonia)   CMV (cytomegalovirus)   Sacral decubitus ulcer, stage II   Hyponatremia   Pneumomediastinum   1)Presumed PCP PNA:  Continue with IV bactrim and high dose steroids. Currently on day #7 ( 23 day course of therapy). DFA stains are negative, but technically collected after 2 wks of bactrim therapy. Will check fungitell assay. occ do see discordance of isolation of fungitell+ with negative DFA from sampling. Will continue at high dose bactrim. He has developed complications of pneumomediastinum which is occasionally seen with PCP  2) concurrent CAP: currently on day 7 cefepime. Thus far cx remain negative. Will extend course ot 10 day since had fever yesterday but vent settings are better today.  3)  HIV/AIDS: currently onTivicay and Truvada to accomodate for intubation and mode of delivery of meds. Will check viral load and cd 4 count  4) oi proph: continue weekly prophhylactic azithromycin  5) cmv viremia =    Continue with ganciclovir for now. No leukopenia SE thus far. If clinically worsens, Dr. Allyne Gee to come to do inpatient eval. Repeat cmv vl is pending  6) respiratory distress/ ARDS 2/2 PCP, possible immune reactivation. Continue with pulm management of vents and steroids for now. Agree with getting trach next week in order to help minimize sedation. Would like to have repeat BAL sent for aerobic, fungal and viral cultures. May need to do mtb pcr, and cmv pcr  And fungitell on BAL fluid  7) recurrent fever = will repeat blood cx as well as afb cx (looking for MAC)  8) mental status = await to when can d/c paralytics to decide if need to do NCHCT as well as assess any vision changes   Spent with greater than 50% discussion with family and coordination of care   LOS: 7 days   Judyann Munson 10/08/2013,  9:35 AM

## 2013-10-08 NOTE — Progress Notes (Signed)
ANTIBIOTIC CONSULT NOTE - Follow-up  Pharmacy Consult for Cefepime/Bactrim Indication: PCP PNA/CAP  Allergies  Allergen Reactions  . Penicillins Rash    Patient Measurements: Height: 5' 10"  (177.8 cm) Weight: 182 lb 12.2 oz (82.9 kg) IBW/kg (Calculated) : 73   Vital Signs: Temp: 97.5 F (36.4 C) (04/11 1200) Temp src: Axillary (04/11 1200) BP: 113/63 mmHg (04/11 1000) Pulse Rate: 77 (04/11 1200) Intake/Output from previous day: 04/10 0701 - 04/11 0700 In: 3712.8 [I.V.:1734.7; NG/GT:620; IV Piggyback:1358.1] Out: 6195 [Urine:6195] Intake/Output from this shift: Total I/O In: 180 [NG/GT:180] Out: 700 [Urine:700]  Labs:  Recent Labs  10/06/13 0500 10/07/13 0356 10/08/13 0400  WBC 5.1 6.5 7.0  HGB 8.3* 8.8* 8.8*  PLT 168 175 192  CREATININE 0.70 0.63 0.71   Estimated Creatinine Clearance: 117.9 ml/min (by C-G formula based on Cr of 0.71). No results found for this basename: VANCOTROUGH, VANCOPEAK, VANCORANDOM, GENTTROUGH, GENTPEAK, GENTRANDOM, TOBRATROUGH, TOBRAPEAK, TOBRARND, AMIKACINPEAK, AMIKACINTROU, AMIKACIN,  in the last 72 hours   Microbiology: Recent Results (from the past 720 hour(s))  CULTURE, BLOOD (ROUTINE X 2)     Status: None   Collection Time    09/13/13 11:05 AM      Result Value Ref Range Status   Specimen Description BLOOD LEFT ARM   Final   Special Requests BOTTLES DRAWN AEROBIC AND ANAEROBIC [facility] EACH   Final   Culture  Setup Time     Final   Value: 09/13/2013 14:03     Performed at [facility]   Culture     Final   Value: NO GROWTH 5 DAYS     Performed at [facility]   Report Status 09/19/2013 FINAL   Final  CULTURE, BLOOD (ROUTINE X 2)     Status: None   Collection Time    09/13/13 11:15 AM      Result Value Ref Range Status   Specimen Description BLOOD RIGHT ARM   Final   Special Requests BOTTLES DRAWN AEROBIC AND ANAEROBIC 10CC EACH   Final   Culture  Setup Time     Final   Value: 09/13/2013 14:03     Performed  at [facility]   Culture     Final   Value: NO GROWTH 5 DAYS     Performed at [facility]   Report Status 09/19/2013 FINAL   Final  MRSA PCR SCREENING     Status: Abnormal   Collection Time    09/13/13  8:17 PM      Result Value Ref Range Status   MRSA by PCR INVALID RESULTS, SPECIMEN SENT FOR CULTURE (*) NEGATIVE Corrected   Comment: RESULT CALLED TO, READ BACK BY AND VERIFIED WITH:     SPOKE WITH BANKS,T RN [phone] COVINGTON,N     CORRECTED ON 03/18 AT 1155: PREVIOUSLY REPORTED AS RESULT CALLED TO, READ BACK BY AND VERIFIED WITH: SPOKE WITH BANKS,T RN [phone] COVINGTON,N        The GeneXpert MRSA Assay (FDA approved for NASAL specimens only), is one component of a      comprehensive MRSA colonization surveillance program. It is not intended to diagnose MRSA infection nor to guide or monitor treatment for MRSA infections.  MRSA CULTURE     Status: None   Collection Time    09/13/13  8:17 PM      Result Value Ref Range Status   Specimen Description NASOPHARYNGEAL   Final   Special Requests NONE   Final  Culture     Final   Value: NOMRSA     Performed at [facility]   Report Status 09/16/2013 FINAL   Final  RESPIRATORY VIRUS PANEL     Status: None   Collection Time    09/14/13 12:20 PM      Result Value Ref Range Status   Source - RVPAN NASAL SWAB   Corrected   Comment: CORRECTED ON 03/19 AT 1848: PREVIOUSLY REPORTED AS NASAL SWAB   Respiratory Syncytial Virus A NOT DETECTED   Final   Respiratory Syncytial Virus B NOT DETECTED   Final   Influenza A NOT DETECTED   Final   Influenza B NOT DETECTED   Final   Parainfluenza 1 NOT DETECTED   Final   Parainfluenza 2 NOT DETECTED   Final   Parainfluenza 3 NOT DETECTED   Final   Metapneumovirus NOT DETECTED   Final   Rhinovirus NOT DETECTED   Final   Adenovirus NOT DETECTED   Final   Influenza A H1 NOT DETECTED   Final   Influenza A H3 NOT DETECTED   Final   Comment: (NOTE)           Normal  Reference Range for each Analyte: NOT DETECTED     Testing performed using the Luminex xTAG Respiratory Viral Panel test     kit.     This test was developed and its performance characteristics determined     by [facility]. It has not been cleared or approved by the [place]     Food and Drug Administration. This test is used for clinical purposes.     It should not be regarded as investigational or for research. This     laboratory is certified under the [employer] (CLIA) as qualified to perform high complexity     clinical laboratory testing.     Performed at [place]     Status: None   Collection Time    09/14/13  9:15 PM      Result Value Ref Range Status   CT Probe RNA NEGATIVE  NEGATIVE Final   GC Probe RNA NEGATIVE  NEGATIVE Final   Comment: (NOTE)                                                                                               **Normal Reference Range: Negative**          Assay performed using the Gen-Probe APTIMA COMBO2 (R) Assay.     Acceptable specimen types for this assay include APTIMA Swabs (Unisex,     endocervical, urethral, or vaginal), first void urine, and ThinPrep     liquid based cytology samples.     Performed at [place] [facility]     Status: None   Collection Time    09/14/13 10:15 PM      Result Value Ref Range Status   Specimen Source-PJSRC SPUTUM   Final   Pneumocystis jiroveci Ag NEGATIVE   Final  Comment: Performed at [place], BLOOD (ROUTINE X 2)     Status: None   Collection Time    10/10/2013 10:20 AM      Result Value Ref Range Status   Specimen Description BLOOD LEFT ANTECUBITAL   Final   Special Requests BOTTLES DRAWN AEROBIC AND ANAEROBIC 5 CC EACH   Final   Culture  Setup Time     Final   Value: 10/14/2013 14:52     Performed at [facility]   Culture     Final   Value:  NO GROWTH 5 DAYS     Performed at [facility]   Report Status 10/07/2013 FINAL   Final  CULTURE, BLOOD (ROUTINE X 2)     Status: None   Collection Time    10/04/2013 10:35 AM      Result Value Ref Range Status   Specimen Description BLOOD RIGHT ANTECUBITAL   Final   Special Requests BOTTLES DRAWN AEROBIC AND ANAEROBIC [facility] EACH   Final   Culture  Setup Time     Final   Value: 10/12/2013 14:52     Performed at [facility]   Culture     Final   Value: NO GROWTH 5 DAYS     Performed at [facility]   Report Status 10/07/2013 FINAL   Final  PNEUMOCYSTIS JIROVECI SMEAR BY [facility]     Status: None   Collection Time    10/02/13 10:08 AM      Result Value Ref Range Status   Specimen Source-PJSRC BRONCHIAL ALVEOLAR LAVAGE   Corrected   Comment: CORRECTED ON 04/05 AT 1024: PREVIOUSLY REPORTED AS BRONCHIAL WASHINGS   Pneumocystis jiroveci Ag NEGATIVE   Final   Comment: Performed at [place], RESPIRATORY (NON-EXPECTORATED)     Status: None   Collection Time    10/02/13 10:08 AM      Result Value Ref Range Status   Specimen Description BRONCHIAL ALVEOLAR LAVAGE   Final   Special Requests Immunocompromised   Final   Gram Stain     Final   Value: RARE WBC PRESENT, PREDOMINANTLY PMN     RARE SQUAMOUS EPITHELIAL CELLS PRESENT     NO ORGANISMS SEEN     Performed at [facility]   Culture     Final   Value: NO GROWTH 2 DAYS     Performed at [facility]   Report Status 10/04/2013 FINAL   Final  FUNGUS CULTURE W SMEAR     Status: None   Collection Time    10/02/13 10:08 AM      Result Value Ref Range Status   Specimen Description BRONCHIAL ALVEOLAR LAVAGE   Final   Special Requests Immunocompromised   Final   Fungal Smear     Final   Value: NO YEAST OR FUNGAL ELEMENTS SEEN     Performed at [facility]   Culture     Final   Value: CULTURE IN PROGRESS FOR FOUR WEEKS     Performed at [facility]   Report  Status PENDING   Incomplete  AFB CULTURE WITH SMEAR     Status: None   Collection Time    10/02/13 10:08 AM      Result Value Ref Range Status   Specimen Description BRONCHIAL ALVEOLAR LAVAGE   Final   Special Requests Immunocompromised   Final   ACID  FAST SMEAR     Final   Value: NO ACID FAST BACILLI SEEN     Performed at [facility]   Culture     Final   Value: CULTURE WILL BE EXAMINED FOR 6 WEEKS BEFORE ISSUING A FINAL REPORT     Performed at [facility]   Report Status PENDING   Incomplete  CMV CULTURE CMVC     Status: None   Collection Time    10/02/13 10:08 AM      Result Value Ref Range Status   Specimen Description BRONCHIAL ALVEOLAR LAVAGE   Final   Special Requests Immunocompromised   Final   Culture     Final   Value: No Cytomegalovirus identified in cell culture                                                                A negative result does not exclude the possibility of CMV infection;inappropriate specimen collection,storage and transport may lead to false      negative results.     Performed at [facility]   Report Status 10/05/2013 FINAL   Final    Medical History: Past Medical History  Diagnosis Date  . Hypertension   . Hyperlipidemia   . Chronic headaches   . HIV (human immunodeficiency virus infection)     Medications:  Scheduled:  . antiseptic oral rinse  15 mL Mouth Rinse QID  . artificial tears  1 application Both Eyes 3 times per day  . aspirin  81 mg Oral Daily  . azithromycin  1,200 mg Oral Weekly  . ceFEPime (MAXIPIME) IV  1 g Intravenous 3 times per day  . chlorhexidine  15 mL Mouth Rinse BID  . clonazePAM  1 mg Oral BID  . docusate  100 mg Oral Daily  . dolutegravir  50 mg Oral Daily  . emtricitabine-tenofovir  1 tablet Oral Daily  . enoxaparin (LOVENOX) injection  40 mg Subcutaneous Q24H  . feeding supplement (OXEPA)  1,000 mL Per Tube Q24H  . feeding supplement (PRO-STAT SUGAR FREE 64)  30 mL Per Tube QID   . furosemide  40 mg Intravenous Q6H  . ganciclovir (CYTOVENE) IV  5 mg/kg Intravenous Q12H  . insulin aspart  0-15 Units Subcutaneous 6 times per day  . methylPREDNISolone (SOLU-MEDROL) injection  40 mg Intravenous 3 times per day  . pantoprazole sodium  40 mg Per Tube Daily  . sulfamethoxazole-trimethoprim  450 mg Intravenous Q8H   Infusions:  . cisatracurium (NIMBEX) infusion 3.5 mcg/kg/min (10/08/13 0305)  . fentaNYL infusion INTRAVENOUS 300 mcg/hr (10/08/13 1200)  . propofol 60 mcg/kg/min (10/08/13 1300)   PRN: sodium chloride, acetaminophen (TYLENOL) oral liquid 160 mg/5 mL, albuterol, fentaNYL, lactulose, midazolam, senna-docusate Assessment: 48 yo M with HIV/AIDS with presumed PCP PNA, Concurrent CAP, and CMV viremia  D8/10 Cefepime 1g IV q8h, D8/22 Septra 464m IV q8h (~ 17 mg/kg/day, goal 15-20, weight increasing) for PCP PNA +/- HCAP.  D6 ganciclovir 564mkg IV q12h for CMV retinitis induction therapy  Zithromax 1200 mg weekly continued for MAC ppx  WBC WNL Renal function WNL, CrCl > 100 ml/min  Febrile to 101.5 yesterday, remains AF today  4/4 >> Vanc >> 4/4 4/4 >> Cefepime >>  4/4 >> Bactrim  IV >> 4/6 >> ganciclovir >>  Micro Data:  4/4 blood x2:  NG Final 4/4 Histoplasma ag: negative 4/5 PCP by [facility]: negative 4/5 AFB: pending, in progress for 6 weeks 4/5 Resp: no growth, final 4/5 Fungal: pending, in progress for 4 weeks 4/5 CMV cx: negative 4/11 Bloood x 2: REPEAT culture SENT 4/11 AFB Blood: SENT  Goal of Therapy:  Cefepime/septra per renal function   Plan:  1.) Continue Cefepime 1 gram IV q8h 2.) Continue Ganciclovir 5 mg/kg q12h 3.) Continue Bactrim 450 IV q8h -- Monitor renal function and potassium closely while on bactrim 4.) f/u cultures   Gaye Alken Kyley Laurel PharmD Pager #: [phone] 2:07 PM 10/08/2013

## 2013-10-09 ENCOUNTER — Inpatient Hospital Stay (HOSPITAL_COMMUNITY): Payer: BC Managed Care – PPO

## 2013-10-09 LAB — COMPREHENSIVE METABOLIC PANEL
ALT: 40 U/L (ref 0–53)
AST: 37 U/L (ref 0–37)
Albumin: 1.6 g/dL — ABNORMAL LOW (ref 3.5–5.2)
Alkaline Phosphatase: 59 U/L (ref 39–117)
BUN: 31 mg/dL — ABNORMAL HIGH (ref 6–23)
CHLORIDE: 86 meq/L — AB (ref 96–112)
CO2: 41 meq/L — AB (ref 19–32)
CREATININE: 0.71 mg/dL (ref 0.50–1.35)
Calcium: 7.8 mg/dL — ABNORMAL LOW (ref 8.4–10.5)
GFR calc Af Amer: >90 mL/min (ref >90)
Glucose, Bld: 160 mg/dL — ABNORMAL HIGH (ref 70–99)
Potassium: 4.2 mEq/L (ref 3.7–5.3)
Sodium: 132 mEq/L — ABNORMAL LOW (ref 137–147)
Total Protein: 5.2 g/dL — ABNORMAL LOW (ref 6.0–8.3)

## 2013-10-09 LAB — GLUCOSE, CAPILLARY
Glucose-Capillary: 140 mg/dL — ABNORMAL HIGH (ref 70–99)
Glucose-Capillary: 142 mg/dL — ABNORMAL HIGH (ref 70–99)
Glucose-Capillary: 144 mg/dL — ABNORMAL HIGH (ref 70–99)
Glucose-Capillary: 147 mg/dL — ABNORMAL HIGH (ref 70–99)
Glucose-Capillary: 150 mg/dL — ABNORMAL HIGH (ref 70–99)
Glucose-Capillary: 151 mg/dL — ABNORMAL HIGH (ref 70–99)
Glucose-Capillary: 179 mg/dL — ABNORMAL HIGH (ref 70–99)

## 2013-10-09 LAB — CBC WITH DIFFERENTIAL/PLATELET
Basophils Absolute: 0 10*3/uL (ref 0.0–0.1)
Basophils Relative: 0 % (ref 0–1)
Eosinophils Absolute: 0 10*3/uL (ref 0.0–0.7)
Eosinophils Relative: 0 % (ref 0–5)
HCT: 28.1 % — ABNORMAL LOW (ref 39.0–52.0)
Hemoglobin: 8.9 g/dL — ABNORMAL LOW (ref 13.0–17.0)
Lymphocytes Relative: 2 % — ABNORMAL LOW (ref 12–46)
Lymphs Abs: 0.2 10*3/uL — ABNORMAL LOW (ref 0.7–4.0)
MCH: 28.9 pg (ref 26.0–34.0)
MCHC: 31.7 g/dL (ref 30.0–36.0)
MCV: 91.2 fL (ref 78.0–100.0)
MONOS PCT: 3 % (ref 3–12)
Monocytes Absolute: 0.3 10*3/uL (ref 0.1–1.0)
NEUTROS ABS: 8.2 10*3/uL — AB (ref 1.7–7.7)
NEUTROS PCT: 95 % — AB (ref 43–77)
PLATELETS: 216 10*3/uL (ref 150–400)
RBC: 3.08 MIL/uL — AB (ref 4.22–5.81)
RDW: 15.1 % (ref 11.5–15.5)
WBC: 8.7 10*3/uL (ref 4.0–10.5)

## 2013-10-09 LAB — BLOOD GAS, ARTERIAL
ACID-BASE EXCESS: 13.7 mmol/L — AB (ref 0.0–2.0)
Bicarbonate: 40.7 mEq/L — ABNORMAL HIGH (ref 20.0–24.0)
DRAWN BY: 11249
FIO2: 0.9 %
LHR: 24 {breaths}/min
O2 SAT: 94.2 %
PATIENT TEMPERATURE: 98.6
PEEP: 8 cmH2O
PO2 ART: 89.5 mmHg (ref 80.0–100.0)
TCO2: 38.4 mmol/L (ref 0–100)
VT: 440 mL
pCO2 arterial: 70.6 mmHg (ref 35.0–45.0)
pH, Arterial: 7.379 (ref 7.350–7.450)

## 2013-10-09 LAB — PROTIME-INR
INR: 0.98 (ref 0.00–1.49)
PROTHROMBIN TIME: 12.8 s (ref 11.6–15.2)

## 2013-10-09 LAB — APTT: aPTT: 25 seconds (ref 24–37)

## 2013-10-09 MED ORDER — VECURONIUM BROMIDE 10 MG IV SOLR
10.0000 mg | INTRAVENOUS | Status: DC | PRN
  Administered 2013-10-10 – 2013-10-11 (×5): 10 mg via INTRAVENOUS
  Filled 2013-10-09 (×6): qty 10

## 2013-10-09 MED ORDER — DOCUSATE SODIUM 50 MG/5ML PO LIQD
100.0000 mg | Freq: Two times a day (BID) | ORAL | Status: DC
  Administered 2013-10-09 – 2013-10-24 (×23): 100 mg via ORAL
  Filled 2013-10-09 (×32): qty 10

## 2013-10-09 MED ORDER — BISACODYL 10 MG RE SUPP: 10.0000 mg | Freq: Every day | RECTAL | Status: DC | PRN

## 2013-10-09 NOTE — Progress Notes (Addendum)
[facility]  ID: 48 yo M newly diagnosed HIV, cd 4 count of 60/VL [phone] with presumed PCP PNA +/-IRIS vs HCAP, c/b CMV viremia  Subjective: Remains intubated. Paralytics now changed to intermittent use. Fever curve trending down  Medications: Abtx:  #9 bactrim(#25 since prior admit), #7 ganciclovir,#9cefepime  Scheduled Meds: . antiseptic oral rinse  15 mL Mouth Rinse QID  . artificial tears  1 application Both Eyes 3 times per day  . aspirin  81 mg Oral Daily  . azithromycin  1,200 mg Oral Weekly  . ceFEPime (MAXIPIME) IV  1 g Intravenous 3 times per day  . chlorhexidine  15 mL Mouth Rinse BID  . docusate  100 mg Oral BID  . dolutegravir  50 mg Oral Daily  . emtricitabine-tenofovir  1 tablet Oral Daily  . enoxaparin (LOVENOX) injection  40 mg Subcutaneous Q24H  . feeding supplement (OXEPA)  1,000 mL Per Tube Q24H  . feeding supplement (PRO-STAT SUGAR FREE 64)  30 mL Per Tube QID  . furosemide  40 mg Intravenous Q6H  . ganciclovir (CYTOVENE) IV  5 mg/kg Intravenous Q12H  . insulin aspart  0-15 Units Subcutaneous 6 times per day  . methylPREDNISolone (SOLU-MEDROL) injection  40 mg Intravenous 3 times per day  . pantoprazole sodium  40 mg Per Tube Daily  . sulfamethoxazole-trimethoprim  450 mg Intravenous Q8H   Continuous Infusions: . fentaNYL infusion INTRAVENOUS 300 mcg/hr (10/09/13 1200)  . propofol 60 mcg/kg/min (10/09/13 1200)     Objective: Weight change: -9 lb 4.2 oz (-4.2 kg)  Intake/Output Summary (Last 24 hours) at 10/09/13 1221 Last data filed at 10/09/13 1200  Gross per 24 hour  Intake   4195 ml  Output   6275 ml  Net  -2080 ml   Blood pressure 130/69, pulse 96, temperature 97.4 F (36.3 C), temperature source Axillary, resp. rate 24, height 5\' 10"  (1.778 m), weight 173 lb 8 oz (78.7 kg), SpO2 92.00%. Temp:  [97.2 F (36.2 C)-99.8 F (37.7 C)] 97.4 F (36.3 C) (04/12 0800) Pulse Rate:  [80-112] 96 (04/12 1200) Resp:   [19-24] 24 (04/12 1200) BP: (121-167)/(59-84) 130/69 mmHg (04/12 1200) SpO2:  [86 %-96 %] 92 % (04/12 1200) FiO2 (%):  [70 %-90 %] 70 % (04/12 1200) Weight:  [173 lb 8 oz (78.7 kg)] 173 lb 8 oz (78.7 kg) (04/12 0500)  Physical Exam: General: paralized,  on ventilator  HEENT: anterior neck crepitus CVS tachycardic ,  no murmur rubs or gallops Chest:rhonchi anteriorly Abdomen: soft nontender, nondistended, normal bowel sounds, Extremities: no  clubbing or edema noted bilaterally Neuro: nonfocal  CBC:   Recent Labs  10/08/13 0400 10/09/13 0428  WBC 7.0 8.7  HGB 8.8* 8.9*  HCT 27.0* 28.1*  NA 131* 132*  K 4.5 4.2  CL 88* 86*  CO2 34* 41*  BUN 30* 31*  CREATININE 0.71 0.71     BMET  Recent Labs  10/08/13 0400 10/09/13 0428  NA 131* 132*  K 4.5 4.2  CL 88* 86*  CO2 34* 41*  GLUCOSE 156* 160*  BUN 30* 31*  CREATININE 0.71 0.71  CALCIUM 7.6* 7.8*     Micro Results: Recent Results (from the past 240 hour(s))  CULTURE, BLOOD (ROUTINE X 2)     Status: None   Collection Time    10/08/2013 10:20 AM      Result Value Ref Range Status   Specimen Description BLOOD LEFT ANTECUBITAL   Final  Special Requests BOTTLES DRAWN AEROBIC AND ANAEROBIC 5 CC EACH   Final   Culture  Setup Time     Final   Value: 10/10/2013 14:52     Performed at [facility]   Culture     Final   Value: NO GROWTH 5 DAYS     Performed at [facility]   Report Status 10/07/2013 FINAL   Final  CULTURE, BLOOD (ROUTINE X 2)     Status: None   Collection Time    10/22/2013 10:35 AM      Result Value Ref Range Status   Specimen Description BLOOD RIGHT ANTECUBITAL   Final   Special Requests BOTTLES DRAWN AEROBIC AND ANAEROBIC [facility] EACH   Final   Culture  Setup Time     Final   Value: 10/06/2013 14:52     Performed at [facility]   Culture     Final   Value: NO GROWTH 5 DAYS     Performed at [facility]   Report Status 10/07/2013 FINAL   Final  PNEUMOCYSTIS  JIROVECI SMEAR BY [facility]     Status: None   Collection Time    10/02/13 10:08 AM      Result Value Ref Range Status   Specimen Source-PJSRC BRONCHIAL ALVEOLAR LAVAGE   Corrected   Comment: CORRECTED ON 04/05 AT 1024: PREVIOUSLY REPORTED AS BRONCHIAL WASHINGS   Pneumocystis jiroveci Ag NEGATIVE   Final   Comment: Performed at [facility]  CULTURE, RESPIRATORY (NON-EXPECTORATED)     Status: None   Collection Time    10/02/13 10:08 AM      Result Value Ref Range Status   Specimen Description BRONCHIAL ALVEOLAR LAVAGE   Final   Special Requests Immunocompromised   Final   Gram Stain     Final   Value: RARE WBC PRESENT, PREDOMINANTLY PMN     RARE SQUAMOUS EPITHELIAL CELLS PRESENT     NO ORGANISMS SEEN     Performed at [facility]   Culture     Final   Value: NO GROWTH 2 DAYS     Performed at [facility]   Report Status 10/04/2013 FINAL   Final  FUNGUS CULTURE W SMEAR     Status: None   Collection Time    10/02/13 10:08 AM      Result Value Ref Range Status   Specimen Description BRONCHIAL ALVEOLAR LAVAGE   Final   Special Requests Immunocompromised   Final   Fungal Smear     Final   Value: NO YEAST OR FUNGAL ELEMENTS SEEN     Performed at [facility]   Culture     Final   Value: CULTURE IN PROGRESS FOR FOUR WEEKS     Performed at [facility]   Report Status PENDING   Incomplete  AFB CULTURE WITH SMEAR     Status: None   Collection Time    10/02/13 10:08 AM      Result Value Ref Range Status   Specimen Description BRONCHIAL ALVEOLAR LAVAGE   Final   Special Requests Immunocompromised   Final   ACID FAST SMEAR     Final   Value: NO ACID FAST BACILLI SEEN     Performed at [facility]   Culture     Final   Value: CULTURE WILL BE EXAMINED FOR 6 WEEKS BEFORE ISSUING A FINAL REPORT     Performed at [facility]   Report Status PENDING   Incomplete  CMV CULTURE CMVC     Status: None   Collection Time    10/02/13  10:08 AM      Result Value Ref Range Status   Specimen Description BRONCHIAL ALVEOLAR LAVAGE   Final   Special Requests Immunocompromised   Final   Culture     Final   Value: No Cytomegalovirus identified in cell culture                                                                A negative result does not exclude the possibility of CMV infection;inappropriate specimen collection,storage and transport may lead to false      negative results.     Performed at [facility]   Report Status 10/05/2013 FINAL   Final  CULTURE, BLOOD (ROUTINE X 2)     Status: None   Collection Time    10/08/13 12:29 PM      Result Value Ref Range Status   Specimen Description BLOOD RIGHT ARM  2.5 ML IN [facility] BOTTLE   Final   Special Requests NONE   Final   Culture  Setup Time     Final   Value: 10/08/2013 17:24     Performed at [facility]   Culture     Final   Value:        BLOOD CULTURE RECEIVED NO GROWTH TO DATE CULTURE WILL BE HELD FOR 5 DAYS BEFORE ISSUING A FINAL NEGATIVE REPORT     Performed at [facility]   Report Status PENDING   Incomplete  AFB CULTURE, BLOOD     Status: None   Collection Time    10/08/13 12:39 PM      Result Value Ref Range Status   Specimen Description BLOOD LEFT ARM  5 ML IN [facility] BOTTLE   Final   Special Requests NONE   Final   Culture     Final   Value: CULTURE WILL BE EXAMINED FOR 6 WEEKS BEFORE ISSUING A FINAL REPORT     Performed at [facility]   Report Status PENDING   Incomplete  CULTURE, BLOOD (ROUTINE X 2)     Status: None   Collection Time    10/08/13 12:39 PM      Result Value Ref Range Status   Specimen Description BLOOD LEFT ARM  5 ML IN [facility] BOTTLE   Final   Special Requests NONE   Final   Culture  Setup Time     Final   Value: 10/08/2013 17:24     Performed at [facility]   Culture     Final   Value:        BLOOD CULTURE RECEIVED NO GROWTH TO DATE CULTURE WILL BE HELD FOR 5 DAYS BEFORE ISSUING A FINAL NEGATIVE  REPORT     Performed at [facility]   Report Status PENDING   Incomplete    Studies/Results: Dg Chest Port 1 View  10/09/2013   CLINICAL DATA:  Evaluate endotracheal tube.  EXAM: PORTABLE CHEST - 1 VIEW  COMPARISON:  Chest radiograph 10/08/2013 and chest CT 10/06/2013  FINDINGS: Endotracheal tube is in satisfactory position, approximately 3.5 cm above carina. Right  IJ central venous catheter projects over the mid superior vena cava, stable. Nasogastric tube me followed to the proximal stomach and continues below the edge of the image.  Diffuse bilateral airspace disease is without significant interval change. Horizontally oriented bands of lucency projecting over the chest are consistent with subcutaneous emphysema of the anterior chest wall. Subcutaneous emphysema is also seen in the supraclavicular regions bilaterally. Thin lucency along the left mediastinal margin may reflect some residual pneumomediastinum, as seen on recent chest CT. Evaluation or pneumomediastinum is limited by overlying subcutaneous gas. No visible pneumothorax. No visible pleural effusion. Left costophrenic angle excluded from the image. Degenerative changes of the right humeral head noted.  IMPRESSION: No significant change in aeration of the lungs. Diffuse bilateral airspace disease present. Satisfactory position of support devices.  Diffuse subcutaneous emphysema.  Likely small volume of pneumomediastinum. Evaluation for pneumomediastinum somewhat limited by overlying subcutaneous emphysema.   Electronically Signed   By: Britta MccreedySusan  Turner M.D.   On: 10/09/2013 07:44   Dg Chest Port 1 View  10/08/2013   CLINICAL DATA:  Respiratory distress syndrome.  EXAM: PORTABLE CHEST - 1 VIEW  COMPARISON:  October 07, 2013.  FINDINGS: Endotracheal and nasogastric tubes are unchanged in position. Right internal jugular calf line is unchanged in position. Subcutaneous emphysema is seen bilaterally and is not significantly changed. Diffuse  airspace opacities are again noted concerning for pneumonia or edema. No definite pneumothorax or pleural effusion is noted.  IMPRESSION: Stable bilateral lung opacities concerning for pneumonia or edema. Stable support apparatus. Bilateral subcutaneous emphysema is not significantly changed.   Electronically Signed   By: Roque LiasJames  Green M.D.   On: 10/08/2013 08:19      Assessment/Plan:  Principal Problem:   Pneumonia Active Problems:   Acute respiratory failure   AIDS   PCP (pneumocystis carinii pneumonia)   CMV (cytomegalovirus)   Sacral decubitus ulcer, stage II   Hyponatremia   Pneumomediastinum   1)Presumed PCP PNA:  Continue with IV bactrim and high dose steroids. Currently on day #7 ( 23 day course of therapy). DFA stains are negative, but technically collected after 2 wks of bactrim therapy. Will check fungitell assay. occ do see discordance of isolation of fungitell+ with negative DFA from sampling. Will continue at high dose bactrim. He has developed complications of pneumomediastinum which is occasionally seen with PCP  2) concurrent CAP: currently on day 9 cefepime. Thus far cx remain negative. Will extend course ot 10 day since had fever yesterday but vent settings are better today.  3)  CMV disease (viremia +/- pna/retinitis) = CMV VL returned at 78,600. Continue with ganciclovir. Once patient is off paralytics, will ask if he is having difficulty with vision. May have Dr Allyne GeeSanders do inpatient consultation early week if worsening vision. Will ask pulm to resend BAL for viral culture, CMV pcr. He is tolerating ganciclovir thus far.  4) HIV/AIDS: currently onTivicay and Truvada to accomodate for intubation and mode of delivery of meds.  viral load and cd 4 count pending  5) oi proph: continue weekly prophhylactic azithromycin  6) respiratory distress/ ARDS 2/2 PCP, possible immune reactivation. Continue with pulm management of vents and steroids for now. Agree with getting trach  next week in order to help minimize sedation. Would like to have repeat BAL sent for aerobic, fungal and viral cultures. May need to do mtb pcr, and cmv pcr  And fungitell on BAL fluid  7) recurrent fever =  repeat blood cx as well as  afb cx NGTD(looking for MAC), could be due to CMV viremia  8) mental status = await to when can d/c paralytics to decide if need to do NCHCT as well as assess any vision changes  9) other = family asked for me to discuss patient with cousin's friend who is an ID doc. They have signed release consenting for this discussion   Spent with greater than 50% discussion with family and coordination of care   LOS: 8 days   Judyann Munson 10/09/2013, 12:21 PM

## 2013-10-09 NOTE — Progress Notes (Signed)
PULMONARY / CRITICAL CARE MEDICINE   Name: Tennis MustBrent Shutter MRN: [id number] DOB: 06/11/1966    ADMISSION DATE:  10/02/2013 CONSULTATION DATE:  10/11/2013  REFERRING MD :  Rancour PRIMARY SERVICE: PCCM  CHIEF COMPLAINT:  Shortness of breath, fever, cough  BRIEF PATIENT DESCRIPTION: 48 y/o male recently diagnosed with HIV/AIDS -CD4 =30 on 3/31 and treated empirically for PCP [facility] with acute hypoxemic respiratory failure. Note that sputum PCP was neg 3/18 but was on bactrim prior  SIGNIFICANT EVENTS: 4/09 Pneumomediastinum, nimbex added 4/10- desat, lasix started, peep improved to 10 4/12- peep to 8    STUDIES:  4/4 CT angio > no PE, diffuse bilateral GGO and patchy consolidation worse in the bases L > R 4/9 CT chest > pneumomediastinum with subcutaneous emphysema, severe diffuse GGO b/l  LINES / TUBES: 4/5 ETT >> 4/5 R IJ CVL >>  CULTURES: 4/4 blood >> negative 4/4 urine hist >> negative 4/4 CMV pcr >> POSITIVE 4/5 PCP DFA BAL LUL >>negative 4/5 resp culture bal >>negative 4/5 fungal  bal >> 4/5 afb  bal >> 4/5 cmv bal >> negative  ANTIBIOTICS: Per ID 4/4 Bactrim >> 4/4 Vanc >> 4/4 4/4 Cefepime >> Gancyclovir>>>  SUBJECTIVE:  O2 needs better  VITAL SIGNS: Temp:  [97.2 F (36.2 C)-99.8 F (37.7 C)] 97.4 F (36.3 C) (04/12 0800) Pulse Rate:  [77-112] 80 (04/12 0800) Resp:  [24-26] 24 (04/12 0800) BP: (113-167)/(58-84) 130/65 mmHg (04/12 0800) SpO2:  [86 %-96 %] 94 % (04/12 0800) FiO2 (%):  [70 %-90 %] 80 % (04/12 0809) Weight:  [78.7 kg (173 lb 8 oz)] 78.7 kg (173 lb 8 oz) (04/12 0500) VENTILATOR SETTINGS: Vent Mode:  [-] PRVC FiO2 (%):  [70 %-90 %] 80 % Set Rate:  [24 bmp] 24 bmp Vt Set:  [440 mL] 440 mL PEEP:  [8 cmH20] 8 cmH20 Plateau Pressure:  [25 cmH20-27 cmH20] 26 cmH20 INTAKE / OUTPUT: Intake/Output     04/11 0701 - 04/12 0700 04/12 0701 - 04/13 0700   I.V. (mL/kg) 2147 (27.3) 116.9 (1.5)   NG/GT 690 30   IV Piggyback 1850     Total Intake(mL/kg) 4687 (59.6) 146.9 (1.9)   Urine (mL/kg/hr) 5450 (2.9) 500 (3.1)   Total Output 5450 500   Net -763 -353.1          PHYSICAL EXAMINATION:  Gen: ill appearing, paralyzed HEENT: ETT in place PULM: coarse breath sounds, crepitus anterior neck unchanged CV: s1 s 2 regular, r AB: soft, non tender Neuro: RASS -4, paralyzed Ext: 1+ edema Derm: no rashes   LABS:  CBC  Recent Labs Lab 10/07/13 0356 10/08/13 0400 10/09/13 0428  WBC 6.5 7.0 8.7  HGB 8.8* 8.8* 8.9*  HCT 27.0* 27.0* 28.1*  PLT 175 192 216   BMET  Recent Labs Lab 10/07/13 0356 10/08/13 0400 10/09/13 0428  NA 133* 131* 132*  K 4.7 4.5 4.2  CL 92* 88* 86*  CO2 33* 34* 41*  BUN 25* 30* 31*  CREATININE 0.63 0.71 0.71  GLUCOSE 145* 156* 160*   Electrolytes  Recent Labs Lab 10/05/13 0545  10/07/13 0356 10/08/13 0400 10/09/13 0428  CALCIUM 7.8*  < > 8.0* 7.6* 7.8*  MG 2.0  --   --   --   --   PHOS 3.1  --   --   --   --   < > = values in this interval not displayed. Sepsis Markers No results found for this  basename: LATICACIDVEN, PROCALCITON, O2SATVEN,  in the last 168 hours ABG  Recent Labs Lab 10/08/13 0355 10/08/13 1050 10/09/13 0355  PHART 7.458* 7.415 7.379  PCO2ART 49.3* 60.2* 70.6*  PO2ART 175.0* 67.0* 89.5   Liver Enzymes  Recent Labs Lab 10/09/13 0428  AST 37  ALT 40  ALKPHOS 59  BILITOT <0.2*  ALBUMIN 1.6*   Cardiac Enzymes No results found for this basename: TROPONINI, PROBNP,  in the last 168 hours Glucose  Recent Labs Lab 10/08/13 1126 10/08/13 1602 10/08/13 2011 10/09/13 0052 10/09/13 0332 10/09/13 0740  GLUCAP 123* 137* 144* 147* 150* 179*    Imaging  Dg Chest Port 1 View  10/09/2013   CLINICAL DATA:  Evaluate endotracheal tube.  EXAM: PORTABLE CHEST - 1 VIEW  COMPARISON:  Chest radiograph 10/08/2013 and chest CT 10/06/2013  FINDINGS: Endotracheal tube is in satisfactory position, approximately 3.5 cm above carina. Right IJ central  venous catheter projects over the mid superior vena cava, stable. Nasogastric tube me followed to the proximal stomach and continues below the edge of the image.  Diffuse bilateral airspace disease is without significant interval change. Horizontally oriented bands of lucency projecting over the chest are consistent with subcutaneous emphysema of the anterior chest wall. Subcutaneous emphysema is also seen in the supraclavicular regions bilaterally. Thin lucency along the left mediastinal margin may reflect some residual pneumomediastinum, as seen on recent chest CT. Evaluation or pneumomediastinum is limited by overlying subcutaneous gas. No visible pneumothorax. No visible pleural effusion. Left costophrenic angle excluded from the image. Degenerative changes of the right humeral head noted.  IMPRESSION: No significant change in aeration of the lungs. Diffuse bilateral airspace disease present. Satisfactory position of support devices.  Diffuse subcutaneous emphysema.  Likely small volume of pneumomediastinum. Evaluation for pneumomediastinum somewhat limited by overlying subcutaneous emphysema.   Electronically Signed   By: Britta Mccreedy M.D.   On: 10/09/2013 07:44   Dg Chest Port 1 View  10/08/2013   CLINICAL DATA:  Respiratory distress syndrome.  EXAM: PORTABLE CHEST - 1 VIEW  COMPARISON:  October 07, 2013.  FINDINGS: Endotracheal and nasogastric tubes are unchanged in position. Right internal jugular calf line is unchanged in position. Subcutaneous emphysema is seen bilaterally and is not significantly changed. Diffuse airspace opacities are again noted concerning for pneumonia or edema. No definite pneumothorax or pleural effusion is noted.  IMPRESSION: Stable bilateral lung opacities concerning for pneumonia or edema. Stable support apparatus. Bilateral subcutaneous emphysema is not significantly changed.   Electronically Signed   By: Roque Lias M.D.   On: 10/08/2013 08:19    ASSESSMENT /  PLAN:  PULMONARY A:  Acute respiratory failure with ARDS 2nd to PNA in setting of HIV. Pneumomediastinum developed 4/09 P:   Nimbex goal to dc to intermittent Continue solumedrol 40 mg IV q8h with possible PCP and immune reconstitution Would alter protocol to have 70% if able to get to peep 8, however, now on 80%, re increase peep to 10 If decline = prone position consideration abg reviewed,keep same MV, which is lower and improved over time abg in am  Neg balance goals, FACTT Trach when peep O2 needs reduced Lasix to remain same dose  INFECTIOUS A:   PNA in setting of HIV >> ?bacterial, viral, PCP. Stage II buttock ulcers. CMV viremia. P:   Abx, anti-viral per ID Bronch BAL wished from ID when trach done  CARDIOVASCULAR A:  Severe sepsis. Hx of HTN. P:  tele  RENAL A:   Hyponatremia>  likely SIADH >> improved. overloaded P:   Lasix remain kvo  GASTROINTESTINAL A:   Protein calorie malnutrition. P:   Protonix for SUP Tube feeds to goal Need BM dulc colace  HEMATOLOGIC A:   Anemia of critical illness and chronic disease. P:  F/u CBC with further hemoconcetration Lovenox for DVT prevention, crt in am   ENDOCRINE A:   Steroid induced hyperglycemia. P:   SSI  NEUROLOGIC A:    Anxiety, pain control, sedation, severe vent dyschrony P:   Dc klonopin Continue diprivan, fentanyl gtt with prn versed Goal RASS -4 while on paralytic Dc nimbex, add int vec  Updated family at bedside.   CC time 35 minutes.  Mcarthur Rossetti. Tyson Alias, MD, FACP Pgr: [phone] [facility]

## 2013-10-10 ENCOUNTER — Inpatient Hospital Stay (HOSPITAL_COMMUNITY): Payer: BC Managed Care – PPO

## 2013-10-10 LAB — GLUCOSE, CAPILLARY
GLUCOSE-CAPILLARY: 118 mg/dL — AB (ref 70–99)
GLUCOSE-CAPILLARY: 127 mg/dL — AB (ref 70–99)
GLUCOSE-CAPILLARY: 138 mg/dL — AB (ref 70–99)
GLUCOSE-CAPILLARY: 151 mg/dL — AB (ref 70–99)
Glucose-Capillary: 146 mg/dL — ABNORMAL HIGH (ref 70–99)
Glucose-Capillary: 186 mg/dL — ABNORMAL HIGH (ref 70–99)
Glucose-Capillary: 138 mg/dL — ABNORMAL HIGH (ref 70–99)

## 2013-10-10 LAB — CBC WITH DIFFERENTIAL/PLATELET
BASOS ABS: 0 10*3/uL (ref 0.0–0.1)
BASOS PCT: 0 % (ref 0–1)
EOS ABS: 0.1 10*3/uL (ref 0.0–0.7)
Eosinophils Relative: 1 % (ref 0–5)
HCT: 26.3 % — ABNORMAL LOW (ref 39.0–52.0)
HEMOGLOBIN: 8.5 g/dL — AB (ref 13.0–17.0)
Lymphocytes Relative: 2 % — ABNORMAL LOW (ref 12–46)
Lymphs Abs: 0.2 10*3/uL — ABNORMAL LOW (ref 0.7–4.0)
MCH: 29.5 pg (ref 26.0–34.0)
MCHC: 32.3 g/dL (ref 30.0–36.0)
MCV: 91.3 fL (ref 78.0–100.0)
MONOS PCT: 1 % — AB (ref 3–12)
Monocytes Absolute: 0.1 10*3/uL (ref 0.1–1.0)
NEUTROS ABS: 6.5 10*3/uL (ref 1.7–7.7)
NEUTROS PCT: 96 % — AB (ref 43–77)
PLATELETS: 199 10*3/uL (ref 150–400)
RBC: 2.88 MIL/uL — ABNORMAL LOW (ref 4.22–5.81)
RDW: 15.1 % (ref 11.5–15.5)
WBC: 6.8 10*3/uL (ref 4.0–10.5)

## 2013-10-10 LAB — COMPREHENSIVE METABOLIC PANEL
ALT: 40 U/L (ref 0–53)
AST: 40 U/L — AB (ref 0–37)
Albumin: 1.6 g/dL — ABNORMAL LOW (ref 3.5–5.2)
Alkaline Phosphatase: 57 U/L (ref 39–117)
BUN: 26 mg/dL — ABNORMAL HIGH (ref 6–23)
CHLORIDE: 84 meq/L — AB (ref 96–112)
CO2: 43 meq/L — AB (ref 19–32)
Calcium: 7.6 mg/dL — ABNORMAL LOW (ref 8.4–10.5)
Creatinine, Ser: 0.63 mg/dL (ref 0.50–1.35)
Glucose, Bld: 172 mg/dL — ABNORMAL HIGH (ref 70–99)
Potassium: 3.8 mEq/L (ref 3.7–5.3)
SODIUM: 133 meq/L — AB (ref 137–147)
Total Protein: 4.9 g/dL — ABNORMAL LOW (ref 6.0–8.3)

## 2013-10-10 LAB — BLOOD GAS, ARTERIAL
Acid-Base Excess: 17.5 mmol/L — ABNORMAL HIGH (ref 0.0–2.0)
BICARBONATE: 44.3 meq/L — AB (ref 20.0–24.0)
Drawn by: 11249
FIO2: 0.8 %
MECHVT: 440 mL
O2 SAT: 95.9 %
PATIENT TEMPERATURE: 37
PEEP: 10 cmH2O
RATE: 24 resp/min
TCO2: 41 mmol/L (ref 0–100)
pCO2 arterial: 66.1 mmHg (ref 35.0–45.0)
pH, Arterial: 7.441 (ref 7.350–7.450)
pO2, Arterial: 88.2 mmHg (ref 80.0–100.0)

## 2013-10-10 LAB — T-HELPER CELLS (CD4) COUNT (NOT AT ARMC)
CD4 T CELL ABS: 30 /uL — AB (ref 400–2700)
CD4 T CELL HELPER: 12 % — AB (ref 33–55)

## 2013-10-10 LAB — QUANTIFERON TB GOLD ASSAY (BLOOD)
MITOGEN VALUE: 0.48 [IU]/mL
QUANTIFERON NIL VALUE: 0.2 [IU]/mL
TB Ag value: 0.21 IU/mL
TB Antigen Minus Nil Value: 0.01 IU/mL

## 2013-10-10 LAB — RESPIRATORY VIRUS PANEL
ADENOVIRUS: NOT DETECTED
INFLUENZA A H1: NOT DETECTED
Influenza A H3: NOT DETECTED
Influenza A: NOT DETECTED
Influenza B: NOT DETECTED
METAPNEUMOVIRUS: NOT DETECTED
Parainfluenza 1: NOT DETECTED
Parainfluenza 2: DETECTED — AB
Parainfluenza 3: NOT DETECTED
Respiratory Syncytial Virus A: NOT DETECTED
Respiratory Syncytial Virus B: NOT DETECTED
Rhinovirus: NOT DETECTED

## 2013-10-10 LAB — HIV-1 RNA QUANT-NO REFLEX-BLD
HIV 1 RNA QUANT: 756 {copies}/mL — AB (ref <20)
HIV-1 RNA QUANT, LOG: 2.88 {Log} — AB (ref <1.30)

## 2013-10-10 LAB — MISCELLANEOUS TEST

## 2013-10-10 LAB — HIV-1 GENOTYPR PLUS

## 2013-10-10 MED ORDER — STERILE WATER FOR INJECTION IJ SOLN
INTRAMUSCULAR | Status: AC
  Administered 2013-10-10: 10 mL
  Filled 2013-10-10: qty 10

## 2013-10-10 MED ORDER — ROCURONIUM BROMIDE 50 MG/5ML IV SOLN
1.0000 mg/kg | Freq: Once | INTRAVENOUS | Status: AC
  Administered 2013-10-10: 50 mg via INTRAVENOUS

## 2013-10-10 MED ORDER — LIDOCAINE HCL (PF) 1 % IJ SOLN
INTRAMUSCULAR | Status: AC
  Administered 2013-10-10: 5 mL
  Filled 2013-10-10: qty 5

## 2013-10-10 MED ORDER — OXEPA PO LIQD
1000.0000 mL | ORAL | Status: DC
  Administered 2013-10-11 – 2013-10-16 (×6): 1000 mL
  Filled 2013-10-10 (×8): qty 1000

## 2013-10-10 MED ORDER — MIDAZOLAM HCL 2 MG/2ML IJ SOLN
INTRAMUSCULAR | Status: AC
  Administered 2013-10-10: 4 mg via INTRAVENOUS
  Filled 2013-10-10: qty 4

## 2013-10-10 NOTE — Progress Notes (Signed)
NUTRITION FOLLOW UP  Intervention:   - Reduce TF of Oxepa to 20 ml/hr as pt getting high rate of Propfol. Oxepa at 69m/hr with Prostat 362mQID with calories from current Propfol rate will provide 1967 calories, 105g protein, and 37739mree water and meet 105% estimated calorie needs and 100% estimated protein needs. If IVF d/c, recommend 240m7mter flushes 6 times/day.  - Will continue to monitor   Nutrition Dx:   Inadequate oral intake related to inability to eat as evidenced by NPO - ongoing   Goal:   TF regimen to meet >/= 90% of their estimated nutrition needs - met   Monitor:   Weights, labs, TF tolerance, vent status   Assessment:   47 y59 male recently diagnosed with HIV and treated empirically for PCP [facility].   4/5 - Pt intubated 4/5. Per family, pt has lost ~15-20 lbs over the past few weeks. He is using nutritional supplements at home (Boost or Ensure.). Received consult for TF management, RD ordered Vital AF 1.2 @ 20 ml/hr via OG tube and increase by 10 ml every 4 hours to goal rate of 80 ml/hr. At goal rate, tube feeding regimen will provide 2304 kcal, 103 grams of protein, and 1549 ml of H2O. Goal rate meets 100% of estimated calorie and protein needs.   4/7 - Pt started on ARDS protocol 4/6. Per RN, pt tolerating Vital AF 1.2 at goal rate of 80ml45mwith 30ml 57mesiduals this morning and 30-40ml r45muals overnight. Remains intubated.   4/10 - Patient remains intubated.  Receiving Oxepa at 20 ml/hr plus prostat and tolerating well. Propofol: 27.5 ml/hr provides 726 calories continues  4/13 - Remains intubated. Receiving Oxepa at 30ml/hr28mh Prosat 30ml QID76mrovides 1480 calories, 105g protein. TF plus current calories from Propofol provide 2327 calories meeting 124% estimated calorie needs and 100% estimated protein needs. Paralytics added 4/10. Needed ET tube changed today which was performed after  emergency bronchoscopy. Per RN, pt tolerating TF well without any residuals.   Patient is currently intubated on ventilator support MV: 11.8 L/min Temp (24hrs), Avg:98.3 F (36.8 C), Min:97.6 F (36.4 C), Max:98.7 F (37.1 C)  Propofol: 32.1 ml/hr provides 847 calories     Height: Ht Readings from Last 1 Encounters:  10/04/13 5' 10"  (1.778 m)    Weight Status:   Wt Readings from Last 1 Encounters:  10/10/13 177 lb 0.5 oz (80.3 kg)  Admit wt:        155 lb (70.3 kg) Net I/Os: +5.8L  Re-estimated needs:  Kcal: 1873 Protein: 105-125g Fluid: >1.8L/day  Skin: +1 Generalized edema, +1 RUE, LUE, LLE, RLE edema, stage 2 sacral pressure ulcer and on right lateral side   Diet Order: NPO   Intake/Output Summary (Last 24 hours) at 10/10/13 1537 Last data filed at 10/10/13 1415  Gross per 24 hour  Intake 4659.94 ml  Output   6000 ml  Net -1340.06 ml    Last BM: 4/13   Labs:   Recent Labs Lab 10/04/13 0500 10/05/13 0545  10/08/13 0400 10/09/13 0428 10/10/13 0605  NA 132* 132*  < > 131* 132* 133*  K 4.4 4.5  < > 4.5 4.2 3.8  CL 96 93*  < > 88* 86* 84*  CO2 26 28  < > 34* 41* 43*  BUN 16 20  < > 30* 31* 26*  CREATININE 0.72 0.70  < > 0.71  0.71 0.63  CALCIUM 7.8* 7.8*  < > 7.6* 7.8* 7.6*  MG  --  2.0  --   --   --   --   PHOS  --  3.1  --   --   --   --   GLUCOSE 202* 151*  < > 156* 160* 172*  < > = values in this interval not displayed.  CBG (last 3)   Recent Labs  10/10/13 0254 10/10/13 0822 10/10/13 1319  GLUCAP 146* 118* 186*    Scheduled Meds: . antiseptic oral rinse  15 mL Mouth Rinse QID  . artificial tears  1 application Both Eyes 3 times per day  . aspirin  81 mg Oral Daily  . azithromycin  1,200 mg Oral Weekly  . ceFEPime (MAXIPIME) IV  1 g Intravenous 3 times per day  . chlorhexidine  15 mL Mouth Rinse BID  . docusate  100 mg Oral BID  . dolutegravir  50 mg Oral Daily  . emtricitabine-tenofovir  1 tablet Oral Daily  . feeding supplement  (OXEPA)  1,000 mL Per Tube Q24H  . feeding supplement (PRO-STAT SUGAR FREE 64)  30 mL Per Tube QID  . furosemide  40 mg Intravenous Q6H  . ganciclovir (CYTOVENE) IV  5 mg/kg Intravenous Q12H  . insulin aspart  0-15 Units Subcutaneous 6 times per day  . methylPREDNISolone (SOLU-MEDROL) injection  40 mg Intravenous 3 times per day  . pantoprazole sodium  40 mg Per Tube Daily  . sulfamethoxazole-trimethoprim  450 mg Intravenous Q8H    Continuous Infusions: . fentaNYL infusion INTRAVENOUS 400 mcg/hr (10/10/13 1226)  . propofol 70 mcg/kg/min (10/10/13 1449)     [place], RD, [place] Pager [phone] After Hours Pager

## 2013-10-10 NOTE — Progress Notes (Signed)
Patient ID: William Bender, male   DOB: Dec 26, 1965, 48 y.o.   MRN: [id number]         [facility]    Date of Admission:  10/19/2013    Day 10 full dose trimethoprim sulfamethoxazole and steroids        Day 10 cefepime        Day 8 ganciclovir Principal Problem:   Pneumonia Active Problems:   Acute respiratory failure   AIDS   PCP (pneumocystis carinii pneumonia)   CMV (cytomegalovirus)   Sacral decubitus ulcer, stage II   Hyponatremia   Pneumomediastinum   . antiseptic oral rinse  15 mL Mouth Rinse QID  . artificial tears  1 application Both Eyes 3 times per day  . aspirin  81 mg Oral Daily  . azithromycin  1,200 mg Oral Weekly  . ceFEPime (MAXIPIME) IV  1 g Intravenous 3 times per day  . chlorhexidine  15 mL Mouth Rinse BID  . docusate  100 mg Oral BID  . dolutegravir  50 mg Oral Daily  . emtricitabine-tenofovir  1 tablet Oral Daily  . [START ON 10/11/2013] feeding supplement (OXEPA)  1,000 mL Per Tube Q24H  . feeding supplement (PRO-STAT SUGAR FREE 64)  30 mL Per Tube QID  . furosemide  40 mg Intravenous Q6H  . ganciclovir (CYTOVENE) IV  5 mg/kg Intravenous Q12H  . insulin aspart  0-15 Units Subcutaneous 6 times per day  . methylPREDNISolone (SOLU-MEDROL) injection  40 mg Intravenous 3 times per day  . pantoprazole sodium  40 mg Per Tube Daily  . sulfamethoxazole-trimethoprim  450 mg Intravenous Q8H     Objective: Temp:  [98 F (36.7 C)-98.7 F (37.1 C)] 98.5 F (36.9 C) (04/13 0800) Pulse Rate:  [77-116] 110 (04/13 1600) Resp:  [11-30] 30 (04/13 1600) BP: (119-186)/(55-114) 165/81 mmHg (04/13 1600) SpO2:  [23 %-97 %] 90 % (04/13 1600) FiO2 (%):  [70 %-90 %] 80 % (04/13 1222) Weight:  [80.3 kg (177 lb 0.5 oz)] 80.3 kg (177 lb 0.5 oz) (04/13 0300) up from 155 pounds on admission  General: He remains on the ventilator Skin: His nurse reports perianal skin lesions which I was not able to examine today Lungs: Clear anteriorly Cor: Regular  S1-S2 no murmurs  Abdomen: Soft  Lab Results Lab Results  Component Value Date   WBC 6.8 10/10/2013   HGB 8.5* 10/10/2013   HCT 26.3* 10/10/2013   MCV 91.3 10/10/2013   PLT 199 10/10/2013    Lab Results  Component Value Date   CREATININE 0.63 10/10/2013   BUN 26* 10/10/2013   NA 133* 10/10/2013   K 3.8 10/10/2013   CL 84* 10/10/2013   CO2 43* 10/10/2013    Lab Results  Component Value Date   ALT 40 10/10/2013   AST 40* 10/10/2013   ALKPHOS 57 10/10/2013   BILITOT <0.2* 10/10/2013    HIV 1 RNA Quant (copies/mL)  Date Value  10/08/2013 756*  09/27/2013 13616*  09/13/2013 409811*     CD4 T Cell Abs (/uL)  Date Value  10/08/2013 30*  09/27/2013 30*  09/13/2013 60*    Microbiology: Recent Results (from the past 240 hour(s))  CULTURE, BLOOD (ROUTINE X 2)     Status: None   Collection Time    10/04/2013 10:20 AM      Result Value Ref Range Status   Specimen Description BLOOD LEFT ANTECUBITAL   Final   Special Requests BOTTLES DRAWN AEROBIC AND ANAEROBIC  5 CC EACH   Final   Culture  Setup Time     Final   Value: 10/16/2013 14:52     Performed at [facility]   Culture     Final   Value: NO GROWTH 5 DAYS     Performed at [facility]   Report Status 10/07/2013 FINAL   Final  CULTURE, BLOOD (ROUTINE X 2)     Status: None   Collection Time    10/10/2013 10:35 AM      Result Value Ref Range Status   Specimen Description BLOOD RIGHT ANTECUBITAL   Final   Special Requests BOTTLES DRAWN AEROBIC AND ANAEROBIC [facility] EACH   Final   Culture  Setup Time     Final   Value: 10/24/2013 14:52     Performed at [facility]   Culture     Final   Value: NO GROWTH 5 DAYS     Performed at [facility]   Report Status 10/07/2013 FINAL   Final  PNEUMOCYSTIS JIROVECI SMEAR BY DFA     Status: None   Collection Time    10/02/13 10:08 AM      Result Value Ref Range Status   Specimen Source-[facility] BRONCHIAL ALVEOLAR LAVAGE   Corrected   Comment: CORRECTED ON 04/05 AT 1024:  PREVIOUSLY REPORTED AS BRONCHIAL WASHINGS   Pneumocystis jiroveci Ag NEGATIVE   Final   Comment: Performed at [facility]  CULTURE, RESPIRATORY (NON-EXPECTORATED)     Status: None   Collection Time    10/02/13 10:08 AM      Result Value Ref Range Status   Specimen Description BRONCHIAL ALVEOLAR LAVAGE   Final   Special Requests Immunocompromised   Final   Gram Stain     Final   Value: RARE WBC PRESENT, PREDOMINANTLY PMN     RARE SQUAMOUS EPITHELIAL CELLS PRESENT     NO ORGANISMS SEEN     Performed at [facility]   Culture     Final   Value: NO GROWTH 2 DAYS     Performed at [facility]   Report Status 10/04/2013 FINAL   Final  FUNGUS CULTURE W SMEAR     Status: None   Collection Time    10/02/13 10:08 AM      Result Value Ref Range Status   Specimen Description BRONCHIAL ALVEOLAR LAVAGE   Final   Special Requests Immunocompromised   Final   Fungal Smear     Final   Value: NO YEAST OR FUNGAL ELEMENTS SEEN     Performed at [facility]   Culture     Final   Value: CULTURE IN PROGRESS FOR FOUR WEEKS     Performed at [facility]   Report Status PENDING   Incomplete  AFB CULTURE WITH SMEAR     Status: None   Collection Time    10/02/13 10:08 AM      Result Value Ref Range Status   Specimen Description BRONCHIAL ALVEOLAR LAVAGE   Final   Special Requests Immunocompromised   Final   ACID FAST SMEAR     Final   Value: NO ACID FAST BACILLI SEEN     Performed at [facility]   Culture     Final   Value: CULTURE WILL BE EXAMINED FOR 6 WEEKS BEFORE ISSUING A FINAL REPORT     Performed at [facility]   Report Status PENDING  Incomplete  CMV CULTURE CMVC     Status: None   Collection Time    10/02/13 10:08 AM      Result Value Ref Range Status   Specimen Description BRONCHIAL ALVEOLAR LAVAGE   Final   Special Requests Immunocompromised   Final   Culture     Final   Value: No Cytomegalovirus identified in cell  culture                                                                A negative result does not exclude the possibility of CMV infection;inappropriate specimen collection,storage and transport may lead to false      negative results.     Performed at [facility]   Report Status 10/05/2013 FINAL   Final  CULTURE, BLOOD (ROUTINE X 2)     Status: None   Collection Time    10/08/13 12:29 PM      Result Value Ref Range Status   Specimen Description BLOOD RIGHT ARM  2.5 ML IN [facility] BOTTLE   Final   Special Requests NONE   Final   Culture  Setup Time     Final   Value: 10/08/2013 17:24     Performed at [facility]   Culture     Final   Value:        BLOOD CULTURE RECEIVED NO GROWTH TO DATE CULTURE WILL BE HELD FOR 5 DAYS BEFORE ISSUING A FINAL NEGATIVE REPORT     Performed at [facility]   Report Status PENDING   Incomplete  AFB CULTURE, BLOOD     Status: None   Collection Time    10/08/13 12:39 PM      Result Value Ref Range Status   Specimen Description BLOOD LEFT ARM  5 ML IN [facility] BOTTLE   Final   Special Requests NONE   Final   Culture     Final   Value: CULTURE WILL BE EXAMINED FOR 6 WEEKS BEFORE ISSUING A FINAL REPORT     Performed at [facility]   Report Status PENDING   Incomplete  CULTURE, BLOOD (ROUTINE X 2)     Status: None   Collection Time    10/08/13 12:39 PM      Result Value Ref Range Status   Specimen Description BLOOD LEFT ARM  5 ML IN [facility] BOTTLE   Final   Special Requests NONE   Final   Culture  Setup Time     Final   Value: 10/08/2013 17:24     Performed at [facility]   Culture     Final   Value:        BLOOD CULTURE RECEIVED NO GROWTH TO DATE CULTURE WILL BE HELD FOR 5 DAYS BEFORE ISSUING A FINAL NEGATIVE REPORT     Performed at [facility]   Report Status PENDING   Incomplete    Studies/Results: Dg Chest Port 1 View  10/10/2013   CLINICAL DATA:  48 year old male for evaluation of line adjustments. PCP  pneumonia, respiratory failure. AIDS. Initial encounter.  EXAM: PORTABLE CHEST - 1 VIEW  COMPARISON:  10/10/2013 and earlier.  FINDINGS: Portable AP semi upright view at 1038 hrs. Endotracheal tube tip in good position  between the level the clavicles and carina (about 28 mm above the carina. Enteric tube courses to the abdomen and on the second view at 1039 hr projects over the gas distended stomach, but the side hole is not visible (maybe in the distal stomach or duodenum).  Continued widespread Patchy and confluent pulmonary opacity partially obscuring the mediastinal contours. Opacity in the left mid lung appears increased. No pneumothorax. Stable right IJ central line. No large pleural effusion. Stable visualized osseous structures.  IMPRESSION: 1. ET tube in good position. Enteric tube courses to the stomach but the side hole is not identified. Note that there is gaseous distension of the stomach for which NG tube suction would be helpful. 2. Mildly increased confluence of opacity in the left lung. Otherwise stable diffuse pneumonia.   Electronically Signed   By: Augusto GambleLee  Hall M.D.   On: 10/10/2013 11:00   Dg Chest Port 1 View  10/10/2013   CLINICAL DATA:  Respiratory distress.  EXAM: PORTABLE CHEST - 1 VIEW  COMPARISON:  10/09/2013  FINDINGS: The support apparatus is stable. The cardiac silhouette, mediastinal and hilar contours are within normal limits and stable. Persistent severe diffuse airspace process. No definite pleural effusions or pneumothorax. Stable subcutaneous emphysema.  IMPRESSION: Stable support apparatus.  Persistent diffuse airspace process.  Stable subcutaneous emphysema without obvious pneumothorax.   Electronically Signed   By: Loralie ChampagneMark  Gallerani M.D.   On: 10/10/2013 07:25   Dg Chest Port 1 View  10/09/2013   CLINICAL DATA:  Evaluate endotracheal tube.  EXAM: PORTABLE CHEST - 1 VIEW  COMPARISON:  Chest radiograph 10/08/2013 and chest CT 10/06/2013  FINDINGS: Endotracheal tube is in  satisfactory position, approximately 3.5 cm above carina. Right IJ central venous catheter projects over the mid superior vena cava, stable. Nasogastric tube me followed to the proximal stomach and continues below the edge of the image.  Diffuse bilateral airspace disease is without significant interval change. Horizontally oriented bands of lucency projecting over the chest are consistent with subcutaneous emphysema of the anterior chest wall. Subcutaneous emphysema is also seen in the supraclavicular regions bilaterally. Thin lucency along the left mediastinal margin may reflect some residual pneumomediastinum, as seen on recent chest CT. Evaluation or pneumomediastinum is limited by overlying subcutaneous gas. No visible pneumothorax. No visible pleural effusion. Left costophrenic angle excluded from the image. Degenerative changes of the right humeral head noted.  IMPRESSION: No significant change in aeration of the lungs. Diffuse bilateral airspace disease present. Satisfactory position of support devices.  Diffuse subcutaneous emphysema.  Likely small volume of pneumomediastinum. Evaluation for pneumomediastinum somewhat limited by overlying subcutaneous emphysema.   Electronically Signed   By: Britta MccreedySusan  Turner M.D.   On: 10/09/2013 07:44    Assessment: He continues to have diffuse bilateral pulmonary infiltrates, hypoxia and ventilator dependence in the setting of recently diagnosed with advanced HIV infection. He is having rapid viral suppression but has not had enough time to have had significant CD4 reconstitution making this unlikely to be immune reconstitution inflammatory syndrome. This could be a flare of partially treated pneumocystis pneumonia, CMV pneumonitis, HCAP, and/or volume overload. I will return tomorrow afternoon to inspect his new perianal lesions and after I reviewed preliminary bronchoscopy results. So far he does not have a definite indication for contact or droplet precautions. If no  new medications are found we'll consider stopping isolation precautions soon. We had planned on stopping cefepime today but I will continue attending BAL Gram stain and initial culture results.  Plan:  1. Continue current antimicrobial therapy and steroids 2. Continue Truvada and Tivicay 3. Await bronchoscopy results  Cliffton AstersJohn Jorah Hua, MD [facility] [phone] pager   [phone] cell 10/10/2013, 4:59 PM

## 2013-10-10 NOTE — Progress Notes (Addendum)
Afer Emergency Bronch and reposition of ET tube Patient still experiencing low expiratory volumes. Per CCM reintubation of patient.

## 2013-10-10 NOTE — Progress Notes (Addendum)
PULMONARY / CRITICAL CARE MEDICINE   Name: William Bender MRN: [id number] DOB: 11/16/1965    ADMISSION DATE:  10/11/2013 CONSULTATION DATE:  10/25/2013  REFERRING MD :  Rancour PRIMARY SERVICE: PCCM  CHIEF COMPLAINT:  Shortness of breath, fever, cough  BRIEF PATIENT DESCRIPTION: 48 y/o male recently diagnosed with HIV/AIDS -CD4 =30 on 3/31 and treated empirically for PCP [facility] with acute hypoxemic respiratory failure. Note that sputum PCP was neg 3/18 but was on bactrim prior  SIGNIFICANT EVENTS: 4/09 Pneumomediastinum, nimbex added 4/10- desat, lasix started, peep improved to 10 4/12- peep to 8   4/13: ETT had migrated, re-advanced and verified w/ FOB. Obtained BAL at this time   STUDIES:  4/4 CT angio > no PE, diffuse bilateral GGO and patchy consolidation worse in the bases L > R 4/9 CT chest > pneumomediastinum with subcutaneous emphysema, severe diffuse GGO b/l  LINES / TUBES: 4/5 ETT >>4/13 (exchanged)>>> 4/5 R IJ CVL >>  CULTURES: 4/4 blood >> negative 4/4 urine hist >> negative 4/4 CMV pcr >> POSITIVE 4/5 PCP DFA BAL LUL >>negative 4/5 resp culture bal >>negative 4/5 fungal  bal >> 4/5 afb  bal >> 4/5 cmv bal >> negative 4/13 aerobic culture >>> 4/13 fungal >>> 4/13 MTB PCR>>> CMV PCR 4/13>>> fungitell 4/13>>>  ANTIBIOTICS: Per ID 4/4 Bactrim >> 4/4 Vanc >> 4/4 4/4 Cefepime >> Gancyclovir>>>  SUBJECTIVE:  O2 needs better  VITAL SIGNS: Temp:  [97.6 F (36.4 C)-98.7 F (37.1 C)] 98.5 F (36.9 C) (04/13 0800) Pulse Rate:  [77-105] 99 (04/13 0845) Resp:  [15-27] 26 (04/13 0845) BP: (119-151)/(53-84) 134/74 mmHg (04/13 0800) SpO2:  [89 %-97 %] 92 % (04/13 0845) FiO2 (%):  [70 %-90 %] 80 % (04/13 0900) Weight:  [80.3 kg (177 lb 0.5 oz)] 80.3 kg (177 lb 0.5 oz) (04/13 0300) VENTILATOR SETTINGS: Vent Mode:  [-] PRVC FiO2 (%):  [70 %-90 %] 80 % Set Rate:  [24 bmp] 24 bmp Vt Set:  [440 mL] 440 mL PEEP:  [8 cmH20-10 cmH20] 10  cmH20 Plateau Pressure:  [19 cmH20-28 cmH20] 19 cmH20 INTAKE / OUTPUT: Intake/Output     04/12 0701 - 04/13 0700 04/13 0701 - 04/14 0700   I.V. (mL/kg) 2374.9 (29.6)    NG/GT 690    IV Piggyback 1906.3    Total Intake(mL/kg) 4971.2 (61.9)    Urine (mL/kg/hr) 6125 (3.2)    Total Output 6125     Net -1153.8            PHYSICAL EXAMINATION:  Gen: ill appearing, asynchronous.  HEENT: ETT in place PULM: coarse breath sounds, crepitus anterior neck unchanged, + airleak improved after ETT repositioned  CV: s1 s 2 regular, r AB: soft, non tender Neuro: RASS -4, paralyzed Ext: 1+ edema Derm: no rashes   LABS: PULMONARY  Recent Labs Lab 10/06/13 2100 10/08/13 0355 10/08/13 1050 10/09/13 0355 10/10/13 0419  PHART 7.369 7.458* 7.415 7.379 7.441  PCO2ART 57.3* 49.3* 60.2* 70.6* 66.1*  PO2ART 75.3* 175.0* 67.0* 89.5 88.2  HCO3 32.3* 34.2* 37.9* 40.7* 44.3*  TCO2 30.7 31.8 34.3 38.4 41.0  O2SAT 93.5 97.3 89.3 94.2 95.9    CBC  Recent Labs Lab 10/08/13 0400 10/09/13 0428 10/10/13 0605  HGB 8.8* 8.9* 8.5*  HCT 27.0* 28.1* 26.3*  WBC 7.0 8.7 6.8  PLT 192 216 199    COAGULATION  Recent Labs Lab 10/09/13 1045  INR 0.98    CARDIAC  No results found for  this basename: TROPONINI,  in the last 168 hours No results found for this basename: PROBNP,  in the last 168 hours   CHEMISTRY  Recent Labs Lab 10/04/13 0500 10/05/13 0545 10/06/13 0500 10/07/13 0356 10/08/13 0400 10/09/13 0428 10/10/13 0605  NA 132* 132* 135* 133* 131* 132* 133*  K 4.4 4.5 4.5 4.7 4.5 4.2 3.8  CL 96 93* 94* 92* 88* 86* 84*  CO2 26 28 32 33* 34* 41* 43*  GLUCOSE 202* 151* 135* 145* 156* 160* 172*  BUN 16 20 22  25* 30* 31* 26*  CREATININE 0.72 0.70 0.70 0.63 0.71 0.71 0.63  CALCIUM 7.8* 7.8* 7.8* 8.0* 7.6* 7.8* 7.6*  MG  --  2.0  --   --   --   --   --   PHOS  --  3.1  --   --   --   --   --    Estimated Creatinine Clearance: 117.9 ml/min (by C-G formula based on Cr of  0.63).   LIVER  Recent Labs Lab 10/09/13 0428 10/09/13 1045 10/10/13 0605  AST 37  --  40*  ALT 40  --  40  ALKPHOS 59  --  57  BILITOT <0.2*  --  <0.2*  PROT 5.2*  --  4.9*  ALBUMIN 1.6*  --  1.6*  INR  --  0.98  --      INFECTIOUS No results found for this basename: LATICACIDVEN, PROCALCITON,  in the last 168 hours   ENDOCRINE CBG (last 3)   Recent Labs  10/10/13 0025 10/10/13 0254 10/10/13 0822  GLUCAP 151* 146* 118*         IMAGING x48h  Dg Chest Port 1 View  10/10/2013   CLINICAL DATA:  48 year old male for evaluation of line adjustments. PCP pneumonia, respiratory failure. AIDS. Initial encounter.  EXAM: PORTABLE CHEST - 1 VIEW  COMPARISON:  10/10/2013 and earlier.  FINDINGS: Portable AP semi upright view at 1038 hrs. Endotracheal tube tip in good position between the level the clavicles and carina (about 28 mm above the carina. Enteric tube courses to the abdomen and on the second view at 1039 hr projects over the gas distended stomach, but the side hole is not visible (maybe in the distal stomach or duodenum).  Continued widespread Patchy and confluent pulmonary opacity partially obscuring the mediastinal contours. Opacity in the left mid lung appears increased. No pneumothorax. Stable right IJ central line. No large pleural effusion. Stable visualized osseous structures.  IMPRESSION: 1. ET tube in good position. Enteric tube courses to the stomach but the side hole is not identified. Note that there is gaseous distension of the stomach for which NG tube suction would be helpful. 2. Mildly increased confluence of opacity in the left lung. Otherwise stable diffuse pneumonia.   Electronically Signed   By: Augusto Gamble M.D.   On: 10/10/2013 11:00   Dg Chest Port 1 View  10/10/2013   CLINICAL DATA:  Respiratory distress.  EXAM: PORTABLE CHEST - 1 VIEW  COMPARISON:  10/09/2013  FINDINGS: The support apparatus is stable. The cardiac silhouette, mediastinal and hilar  contours are within normal limits and stable. Persistent severe diffuse airspace process. No definite pleural effusions or pneumothorax. Stable subcutaneous emphysema.  IMPRESSION: Stable support apparatus.  Persistent diffuse airspace process.  Stable subcutaneous emphysema without obvious pneumothorax.   Electronically Signed   By: Loralie Champagne M.D.   On: 10/10/2013 07:25   Dg Chest Port 1 View  10/09/2013  CLINICAL DATA:  Evaluate endotracheal tube.  EXAM: PORTABLE CHEST - 1 VIEW  COMPARISON:  Chest radiograph 10/08/2013 and chest CT 10/06/2013  FINDINGS: Endotracheal tube is in satisfactory position, approximately 3.5 cm above carina. Right IJ central venous catheter projects over the mid superior vena cava, stable. Nasogastric tube me followed to the proximal stomach and continues below the edge of the image.  Diffuse bilateral airspace disease is without significant interval change. Horizontally oriented bands of lucency projecting over the chest are consistent with subcutaneous emphysema of the anterior chest wall. Subcutaneous emphysema is also seen in the supraclavicular regions bilaterally. Thin lucency along the left mediastinal margin may reflect some residual pneumomediastinum, as seen on recent chest CT. Evaluation or pneumomediastinum is limited by overlying subcutaneous gas. No visible pneumothorax. No visible pleural effusion. Left costophrenic angle excluded from the image. Degenerative changes of the right humeral head noted.  IMPRESSION: No significant change in aeration of the lungs. Diffuse bilateral airspace disease present. Satisfactory position of support devices.  Diffuse subcutaneous emphysema.  Likely small volume of pneumomediastinum. Evaluation for pneumomediastinum somewhat limited by overlying subcutaneous emphysema.   Electronically Signed   By: Britta MccreedySusan  Turner M.D.   On: 10/09/2013 07:44      agree, persistent diffuse bilateral airspace disease. No  improvement  ASSESSMENT / PLAN:  PULMONARY A:  Acute respiratory failure with ARDS 2nd to PNA in setting of HIV. Pneumomediastinum developed 4/09 Still requiring high FIO2 ETT exchanged 4/13 d/t leak   P:   Cont RAS neg -4 Continue solumedrol 40 mg IV q8h with possible PCP and immune reconstitution Neg balance goals, FACTT; Lasix to remain same dose Trach when peep O2 needs reduced   INFECTIOUS A:   PNA in setting of HIV >> ?bacterial, viral, PCP. Stage II buttock ulcers. CMV viremia. P:   Abx, anti-viral per ID Repeat BAL per ID sent   CARDIOVASCULAR A:  Severe sepsis. Hx of HTN. 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 overloaded P:   Lasix remain kvo  GASTROINTESTINAL A:   Protein calorie malnutrition. P:   Protonix for SUP Tube feeds to goal Need BM  HEMATOLOGIC A:   Anemia of critical illness and chronic disease. P:  F/u CBC with further hemoconcetration Lovenox for DVT prevention (hold 10/10/13 due to post intubation mild bleed)  ENDOCRINE A:   Steroid induced hyperglycemia. P:   SSI  NEUROLOGIC A:    Anxiety, pain control, sedation, severe vent dyschrony P:   Continue diprivan, fentanyl gtt with prn versed Goal RASS -4 while on paralytic Cont PRN Vec   No sig improvement. Cuff blown so will change this out. Difficult to assess O2 needs given current situation. Will cont current plan of care. Hope to get FIO2 requirement down, trach likely late this week. Father updated at bedside.    STAFF NOTE: I, Dr Lavinia SharpsM Shontel Santee have personally reviewed patient's available data, including medical history, events of note, physical examination and test results as part of my evaluation. I have discussed with resident/NP and other care providers such as pharmacist, RN and RRT.  In addition,  I personally evaluated patient and elicited key findings of ARDS physiology, AIDS. Today needed ET tube change. Some post et tube bleeding; will dc  lovenox x 24-48h. Mom updated at bedside and explained obstructed et tube and cuff leak issues  Rest per NP/medical resident whose note is outlined above and that I agree with  The patient is critically  ill with multiple organ systems failure and requires high complexity decision making for assessment and support, frequent evaluation and titration of therapies, application of advanced monitoring technologies and extensive interpretation of multiple databases.   Critical Care Time devoted to patient care services described in this note is  35  Minutes that excludes NP and procedure time.  Dr. Kalman Shan, M.D., [facility].C.P Pulmonary and Critical Care Medicine Staff Physician [facility] [place] [facility] Pager: [phone], If no answer or between  15:00h - 7:00h: call [phone]  10/10/2013 12:28 PM

## 2013-10-10 NOTE — Procedures (Signed)
Bronchoscopy Procedure Note William Bender [id number] 10/20/1965  Procedure: Bronchoscopy Indications: Diagnostic evaluation of the airways and Obtain specimens for culture and/or other diagnostic studies  Procedure Details Consent: consent for bronch from dad verbally and emergent need Time Out: Verified patient identification, verified procedure, site/side was marked, verified correct patient position, special equipment/implants available, medications/allergies/relevent history reviewed, required imaging and test results available.  Performed  In preparation for procedure, patient was given 100% FiO2 and bronchoscope lubricated. Sedation: already on fentanyl and diprivan. Versed 4mg  iv x 1 given  Airway entered and the following bronchi were examined: RML.   Procedures performed: Brushings performed Bronchoscope removed.  , Patient placed back on 100% FiO2 at conclusion of procedure.    Evaluation Hemodynamic Status: BP stable throughout; O2 sats: stable throughout Patient's Current Condition: stable Specimens:  Clear BAL Complications: No apparent complications Patient did tolerate procedure well.   Kalman ShanMurali Wesleigh Markovic 10/10/2013  COMMENTS  High Pip and poor returns,. Bronch done and showed thick CRUD blocking ET tube to account for physilogy. This was jammed repeatedly with bronch and some saline and pulverized. ET tube position noticed at 5cm above carina and repositioned to 3cm above carina. After this at ID consult request, RML BAL 30cc x 2 lavage done with good clear return. Bronchoscope then removed. After this it was noted, he was  There was likely cuff leak of the ET tube and he would lose volume on vent. Plan made to change ET tube  Dr. Kalman ShanMurali Zailah Zagami, M.D., [facility]C.P Pulmonary and Critical Care Medicine Staff Physician [facility] [place] [facility] and Critical [facility] Pager: [phone], If no answer or between  15:00h - 7:00h: call [phone]  10/10/2013 10:10  AM

## 2013-10-10 NOTE — Procedures (Signed)
Intubation Procedure Note Tennis MustBrent Callies [id number] 03/20/1966  Procedure: Intubation Indications: Respiratory insufficiency  Procedure Details Consent: Unable to obtain consent because of emergent medical necessity. Time Out: Verified patient identification, verified procedure, site/side was marked, verified correct patient position, special equipment/implants available, medications/allergies/relevent history reviewed, required imaging and test results available.  Performed  Maximum sterile technique was used including cap, gloves, gown and mask.  MAC    Evaluation Hemodynamic Status: BP stable throughout; O2 sats: transiently fell during during procedure Patient's Current Condition: stable Complications: No apparent complications Patient did not tolerate procedure well. Chest X-ray ordered to verify placement.  CXR: pending.   Kalman ShanMurali Lenette Rau 10/10/2013    Comments  Old Et tube removed using tube exchanger but new et tube would not guide through. Patient then quickly desaturated to 25%. AT this time tube exchanger removed and patient bagged. This was despite glidescope confirming that exhcnager was in airway.Some upper airway bleeding noticed. Patient then bagged for next 5 minutes and pulse ox improved to 93%. At this point, DL done and patient intubated with new 8 size tube   CXR ordered   Dr. Kalman ShanMurali Averianna Brugger, M.D., [facility].C.P Pulmonary and Critical Care Medicine Staff Physician [facility] [place] [facility] and [facility] Pager: [phone], If no answer or between  15:00h - 7:00h: call [phone]  10/10/2013 10:32 AM

## 2013-10-10 NOTE — Progress Notes (Signed)
CARE MANAGEMENT NOTE 10/10/2013  Patient:  William Bender,William Bender   Account Number:  [id number]  Date Initiated:  10/03/2013  Documentation initiated by:  DAVIS,RHONDA  Subjective/Objective Assessment:   pt admitted on 09/30/2013/required [id number]/hcap and failed outpt treatment moved to icu on [id number].     Action/Plan:   tbd, will follow and see what needs are present once extubated and awake.   Anticipated DC Date:  10/13/2013   Anticipated DC Plan:  SKILLED NURSING FACILITY  In-house referral  NA      DC Planning Services  NA      [facility]   Choice offered to / List presented to:  NA   DME arranged  NA      DME agency  NA     HH arranged  NA      HH agency  NA   Status of service:  In process, will continue to follow Medicare Important Message given?  NA - LOS <3 / Initial given by admissions (If response is "NO", the following Medicare IM given date fields will be blank) Date Medicare IM given:   Date Additional Medicare IM given:    Discharge Disposition:    Per UR Regulation:  Reviewed for med. necessity/level of care/duration of stay  If discussed at Long Length of Stay Meetings, dates discussed:   10/06/2013    Comments:  [id number]/Rhonda Earlene Plateravis, RN, BSN, [place] [phone] Chart Reviewed for discharge and hospital needs. Discharge needs at time of review: None present will follow for needs. Review of patient progress due on [id number].   [id number]/Rhonda Earlene PlaterDavis, RN, BSN, [place] [phone] Chart Reviewed for discharge and hospital needs. Discharge needs at time of review: None present will follow for needs. Review of patient progress due on [id number]. vent day4   [id number]/Rhonda Earlene PlaterDavis, RN, BSN, [place] [phone] Chart Reviewed for discharge and hospital needs. Discharge needs at time of review: None present will follow for needs. Review of patient progress due on [id number].

## 2013-10-10 NOTE — Progress Notes (Addendum)
Anders SimmondsPete Babcock CCM NP notified of low patient expiratory volumes, desating and the sound of air moving around cuff. PER CMM Emergency Bronch done.

## 2013-10-11 ENCOUNTER — Inpatient Hospital Stay (HOSPITAL_COMMUNITY): Payer: BC Managed Care – PPO

## 2013-10-11 LAB — COMPREHENSIVE METABOLIC PANEL
ALBUMIN: 1.7 g/dL — AB (ref 3.5–5.2)
ALT: 46 U/L (ref 0–53)
AST: 43 U/L — AB (ref 0–37)
Alkaline Phosphatase: 65 U/L (ref 39–117)
BUN: 23 mg/dL (ref 6–23)
CALCIUM: 7.9 mg/dL — AB (ref 8.4–10.5)
CO2: 43 mEq/L (ref 19–32)
Chloride: 81 mEq/L — ABNORMAL LOW (ref 96–112)
Creatinine, Ser: 0.61 mg/dL (ref 0.50–1.35)
GFR calc Af Amer: >90 mL/min (ref >90)
GFR calc non Af Amer: >90 mL/min (ref >90)
Glucose, Bld: 147 mg/dL — ABNORMAL HIGH (ref 70–99)
Potassium: 3.8 mEq/L (ref 3.7–5.3)
SODIUM: 131 meq/L — AB (ref 137–147)
TOTAL PROTEIN: 5.1 g/dL — AB (ref 6.0–8.3)
Total Bilirubin: <0.2 mg/dL — ABNORMAL LOW (ref 0.3–1.2)

## 2013-10-11 LAB — FUNGAL STAIN: Fungal Smear: NONE SEEN

## 2013-10-11 LAB — BLOOD GAS, ARTERIAL
Acid-Base Excess: 16.1 mmol/L — ABNORMAL HIGH (ref 0.0–2.0)
Bicarbonate: 42 mEq/L — ABNORMAL HIGH (ref 20.0–24.0)
Drawn by: 317871
FIO2: 0.8 %
MECHVT: 440 mL
O2 Saturation: 94.8 %
PCO2 ART: 59.6 mmHg — AB (ref 35.0–45.0)
PEEP: 12 cmH2O
PO2 ART: 87 mmHg (ref 80.0–100.0)
Patient temperature: 97.7
RATE: 26 resp/min
TCO2: 39.5 mmol/L (ref 0–100)
pH, Arterial: 7.46 — ABNORMAL HIGH (ref 7.350–7.450)

## 2013-10-11 LAB — CBC
HCT: 28.5 % — ABNORMAL LOW (ref 39.0–52.0)
HEMOGLOBIN: 8.9 g/dL — AB (ref 13.0–17.0)
MCH: 28.4 pg (ref 26.0–34.0)
MCHC: 31.2 g/dL (ref 30.0–36.0)
MCV: 91.1 fL (ref 78.0–100.0)
Platelets: 222 10*3/uL (ref 150–400)
RBC: 3.13 MIL/uL — ABNORMAL LOW (ref 4.22–5.81)
RDW: 15.3 % (ref 11.5–15.5)
WBC: 9.6 10*3/uL (ref 4.0–10.5)

## 2013-10-11 LAB — GLUCOSE, CAPILLARY
GLUCOSE-CAPILLARY: 123 mg/dL — AB (ref 70–99)
GLUCOSE-CAPILLARY: 148 mg/dL — AB (ref 70–99)
Glucose-Capillary: 175 mg/dL — ABNORMAL HIGH (ref 70–99)
Glucose-Capillary: 119 mg/dL — ABNORMAL HIGH (ref 70–99)
Glucose-Capillary: 146 mg/dL — ABNORMAL HIGH (ref 70–99)

## 2013-10-11 LAB — LACTIC ACID, PLASMA: Lactic Acid, Venous: 2.4 mmol/L — ABNORMAL HIGH (ref 0.5–2.2)

## 2013-10-11 LAB — CK TOTAL AND CKMB (NOT AT ARMC)
CK, MB: 1 ng/mL (ref 0.3–4.0)
RELATIVE INDEX: INVALID (ref 0.0–2.5)
Total CK: 81 U/L (ref 7–232)

## 2013-10-11 LAB — MAGNESIUM: Magnesium: 2 mg/dL (ref 1.5–2.5)

## 2013-10-11 LAB — LIPASE, BLOOD: Lipase: 30 U/L (ref 11–59)

## 2013-10-11 LAB — PHOSPHORUS: PHOSPHORUS: 3.4 mg/dL (ref 2.3–4.6)

## 2013-10-11 MED ORDER — FLUCONAZOLE 100 MG PO TABS
100.0000 mg | ORAL_TABLET | ORAL | Status: DC
  Administered 2013-10-11: 100 mg via ORAL
  Filled 2013-10-11: qty 1

## 2013-10-11 MED ORDER — FUROSEMIDE 10 MG/ML IJ SOLN
40.0000 mg | Freq: Two times a day (BID) | INTRAMUSCULAR | Status: DC
  Administered 2013-10-11 – 2013-10-15 (×8): 40 mg via INTRAVENOUS
  Filled 2013-10-11 (×8): qty 4

## 2013-10-11 MED ORDER — SODIUM CHLORIDE 0.9 % IV SOLN
0.5000 mg/h | INTRAVENOUS | Status: DC
  Administered 2013-10-11: 0.5 mg/h via INTRAVENOUS
  Administered 2013-10-12 – 2013-10-17 (×4): 1 mg/h via INTRAVENOUS
  Administered 2013-10-18 – 2013-10-22 (×9): 6 mg/h via INTRAVENOUS
  Administered 2013-10-23: 3 mg/h via INTRAVENOUS
  Administered 2013-10-24: 1 mg/h via INTRAVENOUS
  Administered 2013-10-24: 3 mg/h via INTRAVENOUS
  Administered 2013-10-25: 4 mg/h via INTRAVENOUS
  Filled 2013-10-11 (×19): qty 5

## 2013-10-11 NOTE — Progress Notes (Signed)
Called by bedside RN, patient is not moving right arm due to pain but non-focal.  Would like to get a head CT but patient has severe ARDS and I am concerned for de-recruitment.  Will notify round MD to evaluate.  Alyson ReedyWesam G. Yacoub, M.D. [facility] Pulmonary/Critical Care Medicine. Pager: [phone]. After hours pager: [phone].

## 2013-10-11 NOTE — Progress Notes (Addendum)
Patient ID: William Bender, male   DOB: 12-Jun-1966, 48 y.o.   MRN: [id number]         [facility]    Date of Admission:  10/21/2013           Day 11 trimethoprim-sulfamethoxazole steroids        Day 11 cefepime        Day 9 ganciclovir Principal Problem:   Pneumonia Active Problems:   Acute respiratory failure   AIDS   PCP (pneumocystis carinii pneumonia)   CMV (cytomegalovirus)   Sacral decubitus ulcer, stage II   Hyponatremia   Pneumomediastinum   . antiseptic oral rinse  15 mL Mouth Rinse QID  . artificial tears  1 application Both Eyes 3 times per day  . aspirin  81 mg Oral Daily  . azithromycin  1,200 mg Oral Weekly  . ceFEPime (MAXIPIME) IV  1 g Intravenous 3 times per day  . chlorhexidine  15 mL Mouth Rinse BID  . docusate  100 mg Oral BID  . dolutegravir  50 mg Oral Daily  . emtricitabine-tenofovir  1 tablet Oral Daily  . feeding supplement (OXEPA)  1,000 mL Per Tube Q24H  . feeding supplement (PRO-STAT SUGAR FREE 64)  30 mL Per Tube QID  . furosemide  40 mg Intravenous Q12H  . ganciclovir (CYTOVENE) IV  5 mg/kg Intravenous Q12H  . insulin aspart  0-15 Units Subcutaneous 6 times per day  . methylPREDNISolone (SOLU-MEDROL) injection  40 mg Intravenous 3 times per day  . pantoprazole sodium  40 mg Per Tube Daily  . sulfamethoxazole-trimethoprim  450 mg Intravenous Q8H   Objective: Temp:  [97.7 F (36.5 C)-98.8 F (37.1 C)] 98 F (36.7 C) (04/14 1200) Pulse Rate:  [80-116] 105 (04/14 1500) Resp:  [0-33] 24 (04/14 1500) BP: (109-167)/(57-81) 133/70 mmHg (04/14 1400) SpO2:  [85 %-96 %] 89 % (04/14 1500) FiO2 (%):  [70 %-80 %] 75 % (04/14 1500) Weight:  [80.3 kg (177 lb 0.5 oz)] 80.3 kg (177 lb 0.5 oz) (04/14 0200)  General: He remains sedated on the ventilator although he does alert and intermittently reaching for his endotracheal tube Skin: He has 3 circular, superficial perianal ulcers Lungs: Clear anteriorly. Moderate amount of  secretions being suctioned Cor: Regular S1 and S2 no murmurs  Lab Results Lab Results  Component Value Date   WBC 9.6 10/11/2013   HGB 8.9* 10/11/2013   HCT 28.5* 10/11/2013   MCV 91.1 10/11/2013   PLT 222 10/11/2013    Lab Results  Component Value Date   CREATININE 0.61 10/11/2013   BUN 23 10/11/2013   NA 131* 10/11/2013   K 3.8 10/11/2013   CL 81* 10/11/2013   CO2 43* 10/11/2013    Lab Results  Component Value Date   ALT 46 10/11/2013   AST 43* 10/11/2013   ALKPHOS 65 10/11/2013   BILITOT <0.2* 10/11/2013      Microbiology: Recent Results (from the past 240 hour(s))  PNEUMOCYSTIS JIROVECI SMEAR BY DFA     Status: None   Collection Time    10/02/13 10:08 AM      Result Value Ref Range Status   Specimen Source-[facility] BRONCHIAL ALVEOLAR LAVAGE   Corrected   Comment: CORRECTED ON 04/05 AT 1024: PREVIOUSLY REPORTED AS BRONCHIAL WASHINGS   Pneumocystis jiroveci Ag NEGATIVE   Final   Comment: Performed at [facility], RESPIRATORY (NON-EXPECTORATED)     Status: None   Collection  Time    10/02/13 10:08 AM      Result Value Ref Range Status   Specimen Description BRONCHIAL ALVEOLAR LAVAGE   Final   Special Requests Immunocompromised   Final   Gram Stain     Final   Value: RARE WBC PRESENT, PREDOMINANTLY PMN     RARE SQUAMOUS EPITHELIAL CELLS PRESENT     NO ORGANISMS SEEN     Performed at [facility]   Culture     Final   Value: NO GROWTH 2 DAYS     Performed at [facility]   Report Status 10/04/2013 FINAL   Final  FUNGUS CULTURE W SMEAR     Status: None   Collection Time    10/02/13 10:08 AM      Result Value Ref Range Status   Specimen Description BRONCHIAL ALVEOLAR LAVAGE   Final   Special Requests Immunocompromised   Final   Fungal Smear     Final   Value: NO YEAST OR FUNGAL ELEMENTS SEEN     Performed at [facility]   Culture     Final   Value: CULTURE IN PROGRESS FOR FOUR WEEKS     Performed at [facility]    Report Status PENDING   Incomplete  AFB CULTURE WITH SMEAR     Status: None   Collection Time    10/02/13 10:08 AM      Result Value Ref Range Status   Specimen Description BRONCHIAL ALVEOLAR LAVAGE   Final   Special Requests Immunocompromised   Final   ACID FAST SMEAR     Final   Value: NO ACID FAST BACILLI SEEN     Performed at [facility]   Culture     Final   Value: CULTURE WILL BE EXAMINED FOR 6 WEEKS BEFORE ISSUING A FINAL REPORT     Performed at [facility]   Report Status PENDING   Incomplete  CMV CULTURE CMVC     Status: None   Collection Time    10/02/13 10:08 AM      Result Value Ref Range Status   Specimen Description BRONCHIAL ALVEOLAR LAVAGE   Final   Special Requests Immunocompromised   Final   Culture     Final   Value: No Cytomegalovirus identified in cell culture                                                                A negative result does not exclude the possibility of CMV infection;inappropriate specimen collection,storage and transport may lead to false      negative results.     Performed at [facility]   Report Status 10/05/2013 FINAL   Final  CULTURE, BLOOD (ROUTINE X 2)     Status: None   Collection Time    10/08/13 12:29 PM      Result Value Ref Range Status   Specimen Description BLOOD RIGHT ARM  2.5 ML IN [facility] BOTTLE   Final   Special Requests NONE   Final   Culture  Setup Time     Final   Value: 10/08/2013 17:24     Performed at [facility]     Final  Value:        BLOOD CULTURE RECEIVED NO GROWTH TO DATE CULTURE WILL BE HELD FOR 5 DAYS BEFORE ISSUING A FINAL NEGATIVE REPORT     Performed at [facility]   Report Status PENDING   Incomplete  AFB CULTURE, BLOOD     Status: None   Collection Time    10/08/13 12:39 PM      Result Value Ref Range Status   Specimen Description BLOOD LEFT ARM  5 ML IN AF[facility] BOTTLE   Final   Special Requests NONE   Final   Culture     Final   Value: CULTURE  WILL BE EXAMINED FOR 6 WEEKS BEFORE ISSUING A FINAL REPORT     Performed at [facility]   Report Status PENDING   Incomplete  CULTURE, BLOOD (ROUTINE X 2)     Status: None   Collection Time    10/08/13 12:39 PM      Result Value Ref Range Status   Specimen Description BLOOD LEFT ARM  5 ML IN [facility] BOTTLE   Final   Special Requests NONE   Final   Culture  Setup Time     Final   Value: 10/08/2013 17:24     Performed at [facility]   Culture     Final   Value:        BLOOD CULTURE RECEIVED NO GROWTH TO DATE CULTURE WILL BE HELD FOR 5 DAYS BEFORE ISSUING A FINAL NEGATIVE REPORT     Performed at [facility]   Report Status PENDING   Incomplete  RESPIRATORY VIRUS PANEL     Status: Abnormal   Collection Time    10/08/13  4:41 PM      Result Value Ref Range Status   Source - [facility] NASAL SWAB   Corrected   Comment: CORRECTED ON 04/13 AT 2130: PREVIOUSLY REPORTED AS NASAL SWAB   Respiratory Syncytial Virus A NOT DETECTED   Final   Respiratory Syncytial Virus B NOT DETECTED   Final   Influenza A NOT DETECTED   Final   Influenza B NOT DETECTED   Final   Parainfluenza 1 NOT DETECTED   Final   Parainfluenza 2 DETECTED (*)  Final   Parainfluenza 3 NOT DETECTED   Final   Metapneumovirus NOT DETECTED   Final   Rhinovirus NOT DETECTED   Final   Adenovirus NOT DETECTED   Final   Influenza A H1 NOT DETECTED   Final   Influenza A H3 NOT DETECTED   Final   Comment: (NOTE)           Normal Reference Range for each Analyte: NOT DETECTED     Testing performed using the Luminex xTAG Respiratory Viral Panel test     kit.     This test was developed and its performance characteristics determined     by [facility]. It has not been cleared or approved by the [place]     Food and Drug Administration. This test is used for clinical purposes.     It should not be regarded as investigational or for research. This     laboratory is certified under the [employer] (CLIA) as qualified to perform high complexity     clinical laboratory testing.     Performed at [facility]     Status: None   Collection Time  10/10/13 10:45 AM      Result Value Ref Range Status   Specimen Description BRONCHIAL ALVEOLAR LAVAGE   Final   Special Requests Immunocompromised   Final   Fungal Smear     Final   Value: NO YEAST OR FUNGAL ELEMENTS SEEN     Performed at [facility]   Report Status 10/11/2013 FINAL   Final  FUNGUS CULTURE W SMEAR     Status: None   Collection Time    10/10/13 10:52 AM      Result Value Ref Range Status   Specimen Description BRONCHIAL ALVEOLAR LAVAGE   Final   Special Requests Immunocompromised   Final   Fungal Smear     Final   Value: NO YEAST OR FUNGAL ELEMENTS SEEN     Performed at [facility]   Culture     Final   Value: CULTURE IN PROGRESS FOR FOUR WEEKS     Performed at [facility]   Report Status PENDING   Incomplete  CULTURE, BAL-QUANTITATIVE     Status: None   Collection Time    10/10/13 10:54 AM      Result Value Ref Range Status   Specimen Description BRONCHIAL ALVEOLAR LAVAGE   Final   Special Requests NONE   Final   Gram Stain     Final   Value: RARE WBC PRESENT,BOTH PMN AND MONONUCLEAR     NO SQUAMOUS EPITHELIAL CELLS SEEN     NO ORGANISMS SEEN     Performed at [facility] Count PENDING   Incomplete   Culture     Final   Value: Non-Pathogenic Oropharyngeal-type Flora Isolated.     Performed at [facility]   Report Status PENDING   Incomplete    Studies/Results: Dg Chest Port 1 View  10/11/2013   CLINICAL DATA:  Check ETT position.  EXAM: PORTABLE CHEST - 1 VIEW  COMPARISON:  10/10/2013  FINDINGS: Endotracheal tube is 3 cm above the carina. Right central line and NG tube are unchanged.  Continued bilateral patchy airspace opacities throughout the lungs. Left mid lung airspace opacity slightly improved.  Otherwise no real change. No visible effusions or pneumothorax. No acute bony abnormality.  IMPRESSION: Patchy bilateral airspace disease continues with slight improved aeration in the left mid lung.   Electronically Signed   By: Rolm Baptise M.D.   On: 10/11/2013 07:37   Dg Chest Port 1 View  10/10/2013   CLINICAL DATA:  48 year old male for evaluation of line adjustments. PCP pneumonia, respiratory failure. AIDS. Initial encounter.  EXAM: PORTABLE CHEST - 1 VIEW  COMPARISON:  10/10/2013 and earlier.  FINDINGS: Portable AP semi upright view at 1038 hrs. Endotracheal tube tip in good position between the level the clavicles and carina (about 28 mm above the carina. Enteric tube courses to the abdomen and on the second view at 1039 hr projects over the gas distended stomach, but the side hole is not visible (maybe in the distal stomach or duodenum).  Continued widespread Patchy and confluent pulmonary opacity partially obscuring the mediastinal contours. Opacity in the left mid lung appears increased. No pneumothorax. Stable right IJ central line. No large pleural effusion. Stable visualized osseous structures.  IMPRESSION: 1. ET tube in good position. Enteric tube courses to the stomach but the side hole is not identified. Note that there is gaseous distension of the stomach for which NG tube suction would be helpful. 2. Mildly increased confluence of  opacity in the left lung. Otherwise stable diffuse pneumonia.   Electronically Signed   By: Lars Pinks M.D.   On: 10/10/2013 11:00   Dg Chest Port 1 View  10/10/2013   CLINICAL DATA:  Respiratory distress.  EXAM: PORTABLE CHEST - 1 VIEW  COMPARISON:  10/09/2013  FINDINGS: The support apparatus is stable. The cardiac silhouette, mediastinal and hilar contours are within normal limits and stable. Persistent severe diffuse airspace process. No definite pleural effusions or pneumothorax. Stable subcutaneous emphysema.  IMPRESSION: Stable support apparatus.  Persistent  diffuse airspace process.  Stable subcutaneous emphysema without obvious pneumothorax.   Electronically Signed   By: Kalman Jewels M.D.   On: 10/10/2013 07:25    Assessment: He continues to require significant supplemental oxygen and support from the ventilator. Otherwise he is stable over the past 24 hours. Latest micro results revealed that he probably has acute parainfluenza pneumonia superimposed upon possible pneumocystis pneumonia and CMV pneumonia. I recommend continuing the trimethoprim-sulfamethoxazole ganciclovir but discontinuing cefepime. It would probably be best to consider beginning to taper his steroids. Reducing immunosuppression is one of the few things that we can do to help him recover from parainfluenza.  Plan: 1. Continue trimethoprim-sulfamethoxazole ganciclovir 2. Discontinue cefepime 3. Continue contact precautions for parainfluenza but discontinue droplet precautions 4. Head prophylactic fluconazole 5. Recommend beginning a taper of steroids  Michel Bickers, MD [facility] [phone] pager   [phone] cell 10/11/2013, 3:48 PM

## 2013-10-11 NOTE — Progress Notes (Signed)
ANTIBIOTIC CONSULT NOTE - Follow-up  Pharmacy Consult for Cefepime/Bactrim Indication: PCP PNA/CAP  Allergies  Allergen Reactions  . Penicillins Rash    Patient Measurements: Height: 5\' 10"  (177.8 cm) Weight: 177 lb 0.5 oz (80.3 kg) IBW/kg (Calculated) : 73  Assessment: 48 yo M with HIV/AIDS with presumed PCP PNA, Concurrent CAP, and CMV viremia  D11 Cefepime 1g IV q8h- to continue until BAL culture drawn 4/13 is resulted  D11/22 Septra 450mg  IV q8h (~ 17 mg/kg/day) for PCP PNA +/- HCAP.  D9 ganciclovir 5mg /kg IV q12h for CMV retinitis induction therapy  Zithromax 1200 mg weekly continued for MAC ppx  Antiinfectives 4/4 >> Vanc >> 4/4 4/4 >> Cefepime >>  4/4 >> Bactrim IV >> 4/6 >> ganciclovir >> PTA >> Qweek azithro (MAC ppx) >>  Vitals / labs Tmax: AF now WBCs: wnl (96% neutrophils) Renal: Stable, CrCl >100 ml/min CD4: 30 (4/11), 30 (3/31) HIV RNA quant: 756 (4/11), 13K (3/31)  Microbiology 4/4 blood x2: NGF 4/4 Histoplasma Ag: negative 4/5 PCP by DFA: negative (on Bactrim x2 weeks prior to collection) 4/5 AFB: ngtd, pending, in progress for 6 weeks 4/5 Resp: NGF 4/5 Fungal: ngtd, pending, in progress for 4 weeks 4/5 CMV cx: negative 4/7 fungitell (B glucan test) 4/8 CMV DNA plasma: 78k copies 4/9 cryptococcal Ag (-) 4/11 blood x2: ngtd 4/11 AFB: ngtd, pending, in progress for 6 weeks 4/11 resp virus panel: +parainfluenza 2 4/11 Quantiferon TB: indeterminate 4/13: CMV DNA: sent 4/13 Fungus w smear: sent 4/13 fungal Ab: sent 4/13 BAL: non-path oro flora, pending 4/13 TB PCR: sent  Goal of Therapy:  Cefepime/septra per renal function   Plan:  1.) Continue Cefepime 1 gram IV q8h 2.) Continue Ganciclovir 5 mg/kg q12h 3.) Continue Bactrim 450 IV q8h -- Monitor renal function and potassium closely while on bactrim 4.) F/u cultures, antibiotic de-escalation, LOT  Thank you for the consult.  Tomi BambergerJesse Krisi Azua, PharmD, BCPS Pager: [phone] Pharmacy:  [phone] 10/11/2013 10:27 AM

## 2013-10-11 NOTE — Progress Notes (Signed)
PULMONARY / CRITICAL CARE MEDICINE   Name: William Bender MRN: [id number] DOB: [birth year]    ADMISSION DATE:  10/11/2013 CONSULTATION DATE:  10/26/2013  REFERRING MD :  Rancour PRIMARY SERVICE: PCCM  CHIEF COMPLAINT:  Shortness of breath, fever, cough  BRIEF PATIENT DESCRIPTION: 48 y/o male recently diagnosed with HIV/AIDS -CD4 =30 on 3/31 and treated empirically for PCP returned to the [facility] ED on 4/4 with acute hypoxemic respiratory failure. Note that sputum PCP was neg 3/18 but was on bactrim prior  SIGNIFICANT EVENTS: 4/09 Pneumomediastinum, nimbex added 4/10- desat, lasix started, peep improved to 10 4/12- peep to 8   4/13: ETT had migrated, re-advanced and verified w/ FOB. Obtained BAL at this time   STUDIES:  4/4 CT angio > no PE, diffuse bilateral GGO and patchy consolidation worse in the bases L > R 4/9 CT chest > pneumomediastinum with subcutaneous emphysema, severe diffuse GGO b/l  LINES / TUBES: 4/5 ETT >>4/13 (exchanged)>>> 4/5 R IJ CVL >>  CULTURES: 4/4 blood >> negative 4/4 urine hist >> negative 4/4 CMV pcr >> POSITIVE 4/5 PCP DFA BAL LUL >>negative 4/5 resp culture bal >>negative 4/5 fungal  bal >> 4/5 afb  bal >> 4/5 cmv bal >> negative 4/11 resp virus panel: + parainfluenza 2 4/13 aerobic culture >>> 4/13 fungal >>> 4/13 MTB PCR>>> CMV PCR 4/13>>> fungitell 4/13>>> BAL 4/13>>>  ANTIBIOTICS: Per ID 4/4 Bactrim >> 4/4 Vanc >> 4/4 4/4 Cefepime >> Gancyclovir>>>  SUBJECTIVE:  No sig improvement   VITAL SIGNS: Temp:  [97.7 F (36.5 C)-98.9 F (37.2 C)] 97.7 F (36.5 C) (04/14 0000) Pulse Rate:  [80-116] 116 (04/14 0800) Resp:  [0-33] 31 (04/14 0800) BP: (109-186)/(57-114) 158/64 mmHg (04/14 0800) SpO2:  [23 %-96 %] 85 % (04/14 0800) FiO2 (%):  [70 %-80 %] 70 % (04/14 0700) Weight:  [80.3 kg (177 lb 0.5 oz)] 80.3 kg (177 lb 0.5 oz) (04/14 0200) VENTILATOR SETTINGS: Vent Mode:  [-] PRVC FiO2 (%):  [70 %-80 %] 70 % Set Rate:  [24 bmp-26 bmp] 26  bmp Vt Set:  [440 mL] 440 mL PEEP:  [10 cmH20-12 cmH20] 12 cmH20 Plateau Pressure:  [19 cmH20-31 cmH20] 29 cmH20 INTAKE / OUTPUT: Intake/Output     04/13 0701 - 04/14 0700 04/14 0701 - 04/15 0700   I.V. (mL/kg) 1719.5 (21.4) 72.1 (0.9)   NG/GT 540 30   IV Piggyback 1934.4    Total Intake(mL/kg) 4193.8 (52.2) 102.1 (1.3)   Urine (mL/kg/hr) 5785 (3) 250 (2.1)   Total Output 5785 250   Net -1591.2 -147.9          PHYSICAL EXAMINATION:  Gen: ill appearing, awake, + accessory muscle use  HEENT: ETT in place PULM: coarse breath sounds, crepitus anterior neck unchanged CV: s1 s 2 regular, r AB: soft, non tender Neuro: RASS -4,  Ext: 1+ edema Derm: no rashes, peri-anal ulcerations noted    LABS: PULMONARY  Recent Labs Lab 10/08/13 0355 10/08/13 1050 10/09/13 0355 10/10/13 0419 10/11/13 0400  PHART 7.458* 7.415 7.379 7.441 7.460*  PCO2ART 49.3* 60.2* 70.6* 66.1* 59.6*  PO2ART 175.0* 67.0* 89.5 88.2 87.0  HCO3 34.2* 37.9* 40.7* 44.3* 42.0*  TCO2 31.8 34.3 38.4 41.0 39.5  O2SAT 97.3 89.3 94.2 95.9 94.8    CBC  Recent Labs Lab 10/09/13 0428 10/10/13 0605 10/11/13 0414  HGB 8.9* 8.5* 8.9*  HCT 28.1* 26.3* 28.5*  WBC 8.7 6.8 9.6  PLT 216 199 222    COAGULATION  Recent Labs Lab 10/09/13  1045  INR 0.98    CARDIAC  No results found for this basename: TROPONINI,  in the last 168 hours No results found for this basename: PROBNP,  in the last 168 hours   CHEMISTRY  Recent Labs Lab 10/05/13 0545  10/07/13 0356 10/08/13 0400 10/09/13 0428 10/10/13 0605 10/11/13 0414  NA 132*  < > 133* 131* 132* 133* 131*  K 4.5  < > 4.7 4.5 4.2 3.8 3.8  CL 93*  < > 92* 88* 86* 84* 81*  CO2 28  < > 33* 34* 41* 43* 43*  GLUCOSE 151*  < > 145* 156* 160* 172* 147*  BUN 20  < > 25* 30* 31* 26* 23  CREATININE 0.70  < > 0.63 0.71 0.71 0.63 0.61  CALCIUM 7.8*  < > 8.0* 7.6* 7.8* 7.6* 7.9*  MG 2.0  --   --   --   --   --  2.0  PHOS 3.1  --   --   --   --   --  3.4  < > =  values in this interval not displayed. Estimated Creatinine Clearance: 117.9 ml/min (by C-G formula based on Cr of 0.61).   LIVER  Recent Labs Lab 10/09/13 0428 10/09/13 1045 10/10/13 0605 10/11/13 0414  AST 37  --  40* 43*  ALT 40  --  40 46  ALKPHOS 59  --  57 65  BILITOT <0.2*  --  <0.2* <0.2*  PROT 5.2*  --  4.9* 5.1*  ALBUMIN 1.6*  --  1.6* 1.7*  INR  --  0.98  --   --      INFECTIOUS No results found for this basename: LATICACIDVEN, PROCALCITON,  in the last 168 hours   ENDOCRINE CBG (last 3)   Recent Labs  10/10/13 1930 10/10/13 2342 10/11/13 0757  GLUCAP 138* 127* 119*         IMAGING x48h  Dg Chest Port 1 View  10/11/2013   CLINICAL DATA:  Check ETT position.  EXAM: PORTABLE CHEST - 1 VIEW  COMPARISON:  10/10/2013  FINDINGS: Endotracheal tube is 3 cm above the carina. Right central line and NG tube are unchanged.  Continued bilateral patchy airspace opacities throughout the lungs. Left mid lung airspace opacity slightly improved. Otherwise no real change. No visible effusions or pneumothorax. No acute bony abnormality.  IMPRESSION: Patchy bilateral airspace disease continues with slight improved aeration in the left mid lung.   Electronically Signed   By: Rolm Baptise M.D.   On: 10/11/2013 07:37   Dg Chest Port 1 View  10/10/2013   CLINICAL DATA:  48 year old male for evaluation of line adjustments. PCP pneumonia, respiratory failure. AIDS. Initial encounter.  EXAM: PORTABLE CHEST - 1 VIEW  COMPARISON:  10/10/2013 and earlier.  FINDINGS: Portable AP semi upright view at 1038 hrs. Endotracheal tube tip in good position between the level the clavicles and carina (about 28 mm above the carina. Enteric tube courses to the abdomen and on the second view at 1039 hr projects over the gas distended stomach, but the side hole is not visible (maybe in the distal stomach or duodenum).  Continued widespread Patchy and confluent pulmonary opacity partially obscuring the  mediastinal contours. Opacity in the left mid lung appears increased. No pneumothorax. Stable right IJ central line. No large pleural effusion. Stable visualized osseous structures.  IMPRESSION: 1. ET tube in good position. Enteric tube courses to the stomach but the side hole is not identified. Note  that there is gaseous distension of the stomach for which NG tube suction would be helpful. 2. Mildly increased confluence of opacity in the left lung. Otherwise stable diffuse pneumonia.   Electronically Signed   By: Lars Pinks M.D.   On: 10/10/2013 11:00   Dg Chest Port 1 View  10/10/2013   CLINICAL DATA:  Respiratory distress.  EXAM: PORTABLE CHEST - 1 VIEW  COMPARISON:  10/09/2013  FINDINGS: The support apparatus is stable. The cardiac silhouette, mediastinal and hilar contours are within normal limits and stable. Persistent severe diffuse airspace process. No definite pleural effusions or pneumothorax. Stable subcutaneous emphysema.  IMPRESSION: Stable support apparatus.  Persistent diffuse airspace process.  Stable subcutaneous emphysema without obvious pneumothorax.   Electronically Signed   By: Kalman Jewels M.D.   On: 10/10/2013 07:25      agree, persistent diffuse bilateral airspace disease. Maybe a little improvement on left   ASSESSMENT / PLAN:  PULMONARY A:  Acute respiratory failure with ARDS 2nd to Parainfluenza PNA and presumed PCP  in setting of HIV. Pneumomediastinum developed 4/09 Still requiring high FIO2 ETT exchanged 4/13 d/t leak CXR a little better, but no sig change clinically. Still on high fio2/peep   P:   Cont RAS neg -2 Continue solumedrol 40 mg IV q8h with possible PCP and immune reconstitution Cont steroids  Neg balance goals, FACTT;  Trach when peep O2 needs reduced Will consider further CT imaging if no improvement   INFECTIOUS A:   PNA in setting of HIV >> viral pneumonitis (+ parainfluenza 2) +/-bacterial, PCP. Stage II buttock ulcers. CMV viremia. His  parainfluenza 2 was positive. Clinically Significant with ARDS  P:   Abx, anti-viral per ID F/u BAL from 4/13  CARDIOVASCULAR A:  Severe sepsis. Hx of HTN. 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 Overloaded Mild contraction alkalosis  P:   Back down on lasix kvo Repeat BMP in am   GASTROINTESTINAL A:   Protein calorie malnutrition. P:   Protonix for SUP Tube feeds to goal   HEMATOLOGIC A:   Anemia of critical illness and chronic disease. P:  F/u CBC with further hemoconcetration Resume LMWH (no further bleeding)  ENDOCRINE A:   Steroid induced hyperglycemia. P:   SSI  NEUROLOGIC A:    Anxiety, pain control, sedation >wake up exam on 4/14 appropriate  P:   Continue diprivan,with prn versed Will rotate narcotics to dilaudid  Ck ck/lactic acid and lipase to be certain no evidence of diprivan infusion syndrome or related pancreatitis  Goal RASS -2 D/c vec   NP summary  No sig improvement. Respiratory viral panel showed: parainfluenza 2 virus. His CMV was positive earlier, and PCP not sure of what significance this finding is... Could be reason for his ARDS. Has evolving contraction alk. Will need to back down on diuresis. Little to add today. Think this is just going to take time. Hope we can slowly decrease his FIO2/peep support by end of week to allow for trach.  Marni Griffon ACNP-BC [place] Pager # [phone] OR # [phone] if no answer    10/11/2013 8:32 AM   STAFF NOTE: I, Dr Ann Lions have personally reviewed patient's available data, including medical history, events of note, physical examination and test results as part of my evaluation. I have discussed with resident/NP and other care providers such as pharmacist, RN and RRT.  In addition,  I personally evaluated patient and elicited key findings  of ARDS, no improvement. Status quo but not worse. Continue current course outlined by NP who also updated  mom at bedside. I too updated mom at bedside.  Rest per NP/medical resident whose note is outlined above and that I agree with  The patient is critically ill with multiple organ systems failure and requires high complexity decision making for assessment and support, frequent evaluation and titration of therapies, application of advanced monitoring technologies and extensive interpretation of multiple databases.   Critical Care Time devoted to patient care services described in this note is  35  Minutes.  Dr. Brand Males, M.D., [facility].C.P Pulmonary and Critical Care Medicine Staff Physician [place] [facility] Pager: [phone], If no answer or between  15:00h - 7:00h: call [phone]  10/11/2013 11:50 AM

## 2013-10-11 NOTE — Progress Notes (Signed)
Mr. William Bender with RUE edema this am at start of shift. Pulses are present and equal and he has moved his right arm intermittently.  Contacted Dr. Molli KnockYacoub to make aware. Advised with good pulses and moving extremity to make aware on rounds tomorrow.

## 2013-10-12 ENCOUNTER — Inpatient Hospital Stay (HOSPITAL_COMMUNITY): Payer: BC Managed Care – PPO

## 2013-10-12 DIAGNOSIS — R609 Edema, unspecified: Secondary | ICD-10-CM

## 2013-10-12 LAB — CBC
HCT: 24.8 % — ABNORMAL LOW (ref 39.0–52.0)
Hemoglobin: 8.3 g/dL — ABNORMAL LOW (ref 13.0–17.0)
MCH: 29.7 pg (ref 26.0–34.0)
MCHC: 33.5 g/dL (ref 30.0–36.0)
MCV: 88.9 fL (ref 78.0–100.0)
PLATELETS: 257 10*3/uL (ref 150–400)
RBC: 2.79 MIL/uL — AB (ref 4.22–5.81)
RDW: 15.3 % (ref 11.5–15.5)
WBC: 13.2 10*3/uL — ABNORMAL HIGH (ref 4.0–10.5)

## 2013-10-12 LAB — COMPREHENSIVE METABOLIC PANEL
ALT: 53 U/L (ref 0–53)
AST: 52 U/L — AB (ref 0–37)
Albumin: 1.7 g/dL — ABNORMAL LOW (ref 3.5–5.2)
Alkaline Phosphatase: 64 U/L (ref 39–117)
BUN: 25 mg/dL — ABNORMAL HIGH (ref 6–23)
CALCIUM: 8.2 mg/dL — AB (ref 8.4–10.5)
CO2: 40 meq/L — AB (ref 19–32)
Chloride: 82 mEq/L — ABNORMAL LOW (ref 96–112)
Creatinine, Ser: 0.52 mg/dL (ref 0.50–1.35)
GFR calc Af Amer: >90 mL/min (ref >90)
Glucose, Bld: 186 mg/dL — ABNORMAL HIGH (ref 70–99)
Potassium: 3.9 mEq/L (ref 3.7–5.3)
Sodium: 128 mEq/L — ABNORMAL LOW (ref 137–147)
TOTAL PROTEIN: 5 g/dL — AB (ref 6.0–8.3)
Total Bilirubin: <0.2 mg/dL — ABNORMAL LOW (ref 0.3–1.2)

## 2013-10-12 LAB — GLUCOSE, CAPILLARY
GLUCOSE-CAPILLARY: 121 mg/dL — AB (ref 70–99)
Glucose-Capillary: 115 mg/dL — ABNORMAL HIGH (ref 70–99)
Glucose-Capillary: 146 mg/dL — ABNORMAL HIGH (ref 70–99)
Glucose-Capillary: 149 mg/dL — ABNORMAL HIGH (ref 70–99)
Glucose-Capillary: 150 mg/dL — ABNORMAL HIGH (ref 70–99)
Glucose-Capillary: 173 mg/dL — ABNORMAL HIGH (ref 70–99)

## 2013-10-12 LAB — CULTURE, BAL-QUANTITATIVE W GRAM STAIN: Colony Count: 100

## 2013-10-12 LAB — TRIGLYCERIDES: Triglycerides: 241 mg/dL — ABNORMAL HIGH (ref <150)

## 2013-10-12 LAB — CULTURE, BAL-QUANTITATIVE

## 2013-10-12 LAB — MAGNESIUM: MAGNESIUM: 2 mg/dL (ref 1.5–2.5)

## 2013-10-12 LAB — PHOSPHORUS: Phosphorus: 3.3 mg/dL (ref 2.3–4.6)

## 2013-10-12 MED ORDER — METHYLPREDNISOLONE SODIUM SUCC 40 MG IJ SOLR
40.0000 mg | Freq: Two times a day (BID) | INTRAMUSCULAR | Status: DC
  Administered 2013-10-12 – 2013-10-15 (×6): 40 mg via INTRAVENOUS
  Filled 2013-10-12 (×6): qty 1

## 2013-10-12 MED ORDER — ENOXAPARIN SODIUM 40 MG/0.4ML ~~LOC~~ SOLN
40.0000 mg | Freq: Every day | SUBCUTANEOUS | Status: DC
  Administered 2013-10-12 – 2013-11-05 (×25): 40 mg via SUBCUTANEOUS
  Filled 2013-10-12 (×26): qty 0.4

## 2013-10-12 MED ORDER — QUETIAPINE FUMARATE 50 MG PO TABS
50.0000 mg | ORAL_TABLET | Freq: Two times a day (BID) | ORAL | Status: DC
  Administered 2013-10-12 – 2013-10-25 (×27): 50 mg
  Filled 2013-10-12 (×30): qty 1

## 2013-10-12 NOTE — Progress Notes (Signed)
PULMONARY / CRITICAL CARE MEDICINE   Name: William Bender MRN: [id number] DOB: 1965/08/07    ADMISSION DATE:  10/18/2013 CONSULTATION DATE:  10/24/2013  REFERRING MD :  Rancour PRIMARY SERVICE: PCCM  CHIEF COMPLAINT:  Shortness of breath, fever, cough  BRIEF PATIENT DESCRIPTION: 48 y/o male recently diagnosed with HIV/AIDS -CD4 =30 on 3/31 and treated empirically for PCP returned to the [facility] ED on 4/4 with acute hypoxemic respiratory failure. Note that sputum PCP was neg 3/18 but was on bactrim prior  SIGNIFICANT EVENTS: 4/09 Pneumomediastinum, nimbex added 4/10- desat, lasix started, peep improved to 10 4/12- peep to 8   4/13: ETT had migrated, re-advanced and verified w/ FOB. Obtained BAL at this time   STUDIES:  4/4 CT angio > no PE, diffuse bilateral GGO and patchy consolidation worse in the bases L > R 4/9 CT chest > pneumomediastinum with subcutaneous emphysema, severe diffuse GGO b/l 4/15 RUE US>>> NEGATIVE (staff MD note)  LINES / TUBES: 4/5 ETT >>4/13 (exchanged)>>> 4/5 R IJ CVL >>  CULTURES: 4/4 blood >> negative 4/4 urine hist >> negative 4/4 CMV pcr >> POSITIVE 4/5 PCP DFA BAL LUL >>negative 4/5 resp culture bal >>negative 4/5 fungal  bal >> 4/5 afb  bal >> 4/5 cmv bal >> negative 4/11 resp virus panel: + parainfluenza 2 4/13 aerobic culture >>> 4/13 fungal >>> 4/13 MTB PCR>>> CMV PCR 4/13>>> fungitell 4/13>>> BAL 4/13>>>NOF  ANTIBIOTICS: Per ID 4/4 Bactrim >> 4/4 Vanc >> 4/4 4/4 Cefepime >>4/14 Gancyclovir>>>  SUBJECTIVE:  Looks a little better   Staff MD note: down to 50% fio2, moving swollen RUE that is negattive for DVT  VITAL SIGNS: Temp:  [98 F (36.7 C)-98.8 F (37.1 C)] 98.2 F (36.8 C) (04/15 0800) Pulse Rate:  [81-114] 97 (04/15 0800) Resp:  [17-59] 24 (04/15 0800) BP: (128-167)/(59-91) 128/91 mmHg (04/15 0800) SpO2:  [84 %-98 %] 91 % (04/15 0800) FiO2 (%):  [55 %-75 %] 55 % (04/15 0800) Weight:  [77.2 kg (170 lb 3.1 oz)] 77.2 kg  (170 lb 3.1 oz) (04/15 0500) VENTILATOR SETTINGS: Vent Mode:  [-] PRVC FiO2 (%):  [55 %-75 %] 55 % Set Rate:  [26 bmp] 26 bmp Vt Set:  [440 mL] 440 mL PEEP:  [12 cmH20] 12 cmH20 Plateau Pressure:  [17 cmH20-29 cmH20] 25 cmH20 INTAKE / OUTPUT: Intake/Output     04/14 0701 - 04/15 0700 04/15 0701 - 04/16 0700   I.V. (mL/kg) 952 (12.3) 111.2 (1.4)   NG/GT 290 40   IV Piggyback 752 100   Total Intake(mL/kg) 1994 (25.8) 251.2 (3.3)   Urine (mL/kg/hr) 2575 (1.4) 1500 (9.1)   Total Output 2575 1500   Net -581 -1248.8        Stool Occurrence 1 x      PHYSICAL EXAMINATION:  Gen: ill appearing, awake, no distress  HEENT: ETT in place PULM: coarse breath sounds some accessory use  CV: s1 s 2 regular, r AB: soft, non tender Neuro: RASS -4, Able to move the swollen RUE (staff MD note) Ext: 1+ edema, RUE swelling  Derm: no rashes, peri-anal ulcerations noted    LABS: PULMONARY  Recent Labs Lab 10/08/13 0355 10/08/13 1050 10/09/13 0355 10/10/13 0419 10/11/13 0400  PHART 7.458* 7.415 7.379 7.441 7.460*  PCO2ART 49.3* 60.2* 70.6* 66.1* 59.6*  PO2ART 175.0* 67.0* 89.5 88.2 87.0  HCO3 34.2* 37.9* 40.7* 44.3* 42.0*  TCO2 31.8 34.3 38.4 41.0 39.5  O2SAT 97.3 89.3 94.2 95.9 94.8    CBC  Recent Labs Lab 10/10/13 0605 10/11/13 0414 10/12/13 0457  HGB 8.5* 8.9* 8.3*  HCT 26.3* 28.5* 24.8*  WBC 6.8 9.6 13.2*  PLT 199 222 257    COAGULATION  Recent Labs Lab 10/09/13 1045  INR 0.98    CARDIAC  No results found for this basename: TROPONINI,  in the last 168 hours No results found for this basename: PROBNP,  in the last 168 hours   CHEMISTRY  Recent Labs Lab 10/08/13 0400 10/09/13 0428 10/10/13 0605 10/11/13 0414 10/12/13 0457  NA 131* 132* 133* 131* 128*  K 4.5 4.2 3.8 3.8 3.9  CL 88* 86* 84* 81* 82*  CO2 34* 41* 43* 43* 40*  GLUCOSE 156* 160* 172* 147* 186*  BUN 30* 31* 26* 23 25*  CREATININE 0.71 0.71 0.63 0.61 0.52  CALCIUM 7.6* 7.8* 7.6* 7.9* 8.2*   MG  --   --   --  2.0 2.0  PHOS  --   --   --  3.4 3.3   Estimated Creatinine Clearance: 117.9 ml/min (by C-G formula based on Cr of 0.52).   LIVER  Recent Labs Lab 10/09/13 0428 10/09/13 1045 10/10/13 0605 10/11/13 0414 10/12/13 0457  AST 37  --  40* 43* 52*  ALT 40  --  40 46 53  ALKPHOS 59  --  57 65 64  BILITOT <0.2*  --  <0.2* <0.2* <0.2*  PROT 5.2*  --  4.9* 5.1* 5.0*  ALBUMIN 1.6*  --  1.6* 1.7* 1.7*  INR  --  0.98  --   --   --     INFECTIOUS  Recent Labs Lab 10/11/13 1100  LATICACIDVEN 2.4*    ENDOCRINE CBG (last 3)   Recent Labs  10/11/13 2356 10/12/13 0351 10/12/13 0748  GLUCAP 115* 149* 146*     IMAGING x48h  Dg Chest Port 1 View  10/12/2013   CLINICAL DATA:  Hypoxia  EXAM: PORTABLE CHEST - 1 VIEW  COMPARISON:  October 11, 2013  FINDINGS: Endotracheal tube tip is 1.6 cm above the carina. Central catheter tip is in the superior vena cava. Nasogastric tube tip and side port are below the diaphragm. No pneumothorax. Patchy airspace disease throughout both lungs is again noted without appreciable change. No new opacity is seen. Heart size and pulmonary vascularity are normal. No adenopathy.  IMPRESSION: Tube and catheter positions as described without pneumothorax. Suggest withdrawing endotracheal tube approximately 2.5 cm. Patchy airspace disease bilaterally is stable compared to 1 day prior. No new opacity.   Electronically Signed   By: Bretta Bang M.D.   On: 10/12/2013 07:03   Dg Chest Port 1 View  10/11/2013   CLINICAL DATA:  Check ETT position.  EXAM: PORTABLE CHEST - 1 VIEW  COMPARISON:  10/10/2013  FINDINGS: Endotracheal tube is 3 cm above the carina. Right central line and NG tube are unchanged.  Continued bilateral patchy airspace opacities throughout the lungs. Left mid lung airspace opacity slightly improved. Otherwise no real change. No visible effusions or pneumothorax. No acute bony abnormality.  IMPRESSION: Patchy bilateral airspace  disease continues with slight improved aeration in the left mid lung.   Electronically Signed   By: Charlett Nose M.D.   On: 10/11/2013 07:37   Dg Chest Port 1 View  10/10/2013   CLINICAL DATA:  48 year old male for evaluation of line adjustments. PCP pneumonia, respiratory failure. AIDS. Initial encounter.  EXAM: PORTABLE CHEST - 1 VIEW  COMPARISON:  10/10/2013 and earlier.  FINDINGS: Portable  AP semi upright view at 1038 hrs. Endotracheal tube tip in good position between the level the clavicles and carina (about 28 mm above the carina. Enteric tube courses to the abdomen and on the second view at 1039 hr projects over the gas distended stomach, but the side hole is not visible (maybe in the distal stomach or duodenum).  Continued widespread Patchy and confluent pulmonary opacity partially obscuring the mediastinal contours. Opacity in the left mid lung appears increased. No pneumothorax. Stable right IJ central line. No large pleural effusion. Stable visualized osseous structures.  IMPRESSION: 1. ET tube in good position. Enteric tube courses to the stomach but the side hole is not identified. Note that there is gaseous distension of the stomach for which NG tube suction would be helpful. 2. Mildly increased confluence of opacity in the left lung. Otherwise stable diffuse pneumonia.   Electronically Signed   By: Augusto GambleLee  Hall M.D.   On: 10/10/2013 11:00   agree, persistent diffuse bilateral airspace disease. Bilateral improvement in aeration   ASSESSMENT / PLAN:  PULMONARY A:  Acute respiratory failure with ARDS 2nd to Parainfluenza PNA and presumed PCP  in setting of HIV. Pneumomediastinum developed 4/09 Still requiring high FIO2 ETT exchanged 4/13 d/t leak CXR a little better, also clinically improving w/ decreased PEEP/FIO2 needs P:   Cont RAS neg -2 Continue solumedrol 40 mg IV q12 h (per recs of ID to begin taper)with possible PCP and immune reconstitution Neg balance goals, FACTT;  Trach when  peep O2 needs reduced   INFECTIOUS A:   PNA in setting of HIV >> viral pneumonitis (+ parainfluenza 2) +/-bacterial, PCP. Stage II buttock ulcers. CMV viremia. His parainfluenza 2 was positive. Clinically Significant with ARDS  P:   Abx, anti-viral per ID  CARDIOVASCULAR A:  Severe sepsis. Hx of HTN. 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 P:   Protonix for SUP Tube feeds to goal   HEMATOLOGIC A:   Anemia of critical illness and chronic disease. RUE swelling  P:  F/u CBC with further hemoconcentration Get  Resume LMWH (no further bleeding)  ENDOCRINE A:   Steroid induced hyperglycemia. P:   SSI  NEUROLOGIC A:    Anxiety, pain control, sedation >wake up exam on 4/14 appropriate  Changed to dilaudid on 4/14, still a little anxious at times. His triglycerides are elevated 248. Not sure what his baseline is as last one was 3 years ago. At this point his lipase is nml, but concerning with diprivan.  P:   Continue diprivan,with prn versed. Cont dilaudid gtt, increase to 1mg /hr  Add Seroquel Goal RASS -2   NP summary  ARDS in setting of parainfluenza 2 virus w/ h/o PCP. Finally seems to be making some improvement. Changed fentanyl to dilaudid on 4/14, looks like he needs a little higher dosing. Hope with higher dilaudid and addition of seroquel we can decrease his diprivan as his triglycerides are elevated. Will need to watch this closely as if they cont to rise will need to stop diprivan. He has RUE swelling. He is high risk for DVT given recent NMB and critical illness, so will get US. Goal today is keep FIO2 ~50%, if he holds here will decrease  peep to 8 tomorrow w/ tentative trach for Friday. Family updated.   Anders SimmondsPete  Babcock ACNP-BC [facility] Pager # [phone] OR # [phone] if no answer    10/12/2013 9:09 AM     STAFF NOTE: I, Dr Lavinia SharpsM Gen Clagg have personally reviewed patient's available data, including medical history, events of note, physical examination and test results as part of my evaluation. I have discussed with resident/NP and other care providers such as pharmacist, RN and RRT.  In addition,  I personally evaluated patient and elicited key findings of ARDS, RUE DVT negative on doppler. Significant improvement with reduced fio2/peep needs. Would wean fio2 ahead of peep. Will ask Dr Tyson AliasFeinstein to evaluate for tracheostomy.  Rest per NP/medical resident whose note is outlined above and that I agree with  The patient is critically ill with multiple organ systems failure and requires high complexity decision making for assessment and support, frequent evaluation and titration of therapies, application of advanced monitoring technologies and extensive interpretation of multiple databases.   Critical Care Time devoted to patient care services described in this note is  35  Minutes.  Dr. Kalman ShanMurali Hunt Zajicek, M.D., [facility].C.P Pulmonary and Critical Care Medicine Staff Physician [facility] [place] [facility] Critical [facility] Pager: [phone], If no answer or between  15:00h - 7:00h: call [phone]  10/12/2013 11:39 AM

## 2013-10-12 NOTE — Progress Notes (Signed)
10/12/13 1400  Clinical Encounter Type  Visited With Family (mom Cordelia PenSherry, wife Clydie BraunKaren, [facility])  Visit Type Follow-up;Spiritual support;Social support  Spiritual Encounters  Spiritual Needs Emotional   Caryn and Cordelia PenSherry were visibly relieved and in good spirits today, reporting positive changes in providers' understanding of Matheau's pneumonia and seeing improvement from treatment.  Visited briefly to offer continued support and encouragement, joining family and local clergy in prayer at bedside.  [place] will continue to follow, but please also page as needs arise.  Thank you.  [address]Chaplain Raine Elsass Brus[place] CreekLundeen, [place] [phone]

## 2013-10-12 NOTE — Progress Notes (Signed)
Patient ID: William Bender, male   DOB: [birth year], 48 y.o.   MRN: [id number]        [facility]    Date of Admission:  10/15/2013           Day 12 trimethoprim-sulfamethoxazole steroids        Day 10 ganciclovir Principal Problem:   Pneumonia Active Problems:   Acute respiratory failure   AIDS   PCP (pneumocystis carinii pneumonia)   CMV (cytomegalovirus)   Sacral decubitus ulcer, stage II   Hyponatremia   Pneumomediastinum   . antiseptic oral rinse  15 mL Mouth Rinse QID  . aspirin  81 mg Oral Daily  . azithromycin  1,200 mg Oral Weekly  . chlorhexidine  15 mL Mouth Rinse BID  . docusate  100 mg Oral BID  . dolutegravir  50 mg Oral Daily  . emtricitabine-tenofovir  1 tablet Oral Daily  . enoxaparin (LOVENOX) injection  40 mg Subcutaneous Daily  . feeding supplement (OXEPA)  1,000 mL Per Tube Q24H  . feeding supplement (PRO-STAT SUGAR FREE 64)  30 mL Per Tube QID  . fluconazole  100 mg Oral Weekly  . furosemide  40 mg Intravenous Q12H  . ganciclovir (CYTOVENE) IV  5 mg/kg Intravenous Q12H  . insulin aspart  0-15 Units Subcutaneous 6 times per day  . methylPREDNISolone (SOLU-MEDROL) injection  40 mg Intravenous Q12H  . pantoprazole sodium  40 mg Per Tube Daily  . QUEtiapine  50 mg Per Tube BID  . sulfamethoxazole-trimethoprim  450 mg Intravenous Q8H   Objective: Temp:  [98.2 F (36.8 C)-98.6 F (37 C)] 98.6 F (37 C) (04/15 1200) Pulse Rate:  [81-114] 94 (04/15 1556) Resp:  [17-59] 29 (04/15 1556) BP: (95-166)/(52-91) 122/66 mmHg (04/15 1556) SpO2:  [84 %-98 %] 90 % (04/15 1556) FiO2 (%):  [40 %-65 %] 50 % (04/15 1556) Weight:  [77.2 kg (170 lb 3.1 oz)] 77.2 kg (170 lb 3.1 oz) (04/15 0500)  General: He remains sedated on the ventilator Lungs: Clear anteriorly. Moderate amount of secretions being suctioned Cor: Regular S1 and S2 no murmurs  Lab Results Lab Results  Component Value Date   WBC 13.2* 10/12/2013   HGB 8.3* 10/12/2013   HCT  24.8* 10/12/2013   MCV 88.9 10/12/2013   PLT 257 10/12/2013    Lab Results  Component Value Date   CREATININE 0.52 10/12/2013   BUN 25* 10/12/2013   NA 128* 10/12/2013   K 3.9 10/12/2013   CL 82* 10/12/2013   CO2 40* 10/12/2013    Lab Results  Component Value Date   ALT 53 10/12/2013   AST 52* 10/12/2013   ALKPHOS 64 10/12/2013   BILITOT <0.2* 10/12/2013      Microbiology: Recent Results (from the past 240 hour(s))  CULTURE, BLOOD (ROUTINE X 2)     Status: None   Collection Time    10/08/13 12:29 PM      Result Value Ref Range Status   Specimen Description BLOOD RIGHT ARM  2.5 ML IN [facility] BOTTLE   Final   Special Requests NONE   Final   Culture  Setup Time     Final   Value: 10/08/2013 17:24     Performed at [facility]   Culture     Final   Value:        BLOOD CULTURE RECEIVED NO GROWTH TO DATE CULTURE WILL BE HELD FOR 5 DAYS BEFORE ISSUING A FINAL NEGATIVE REPORT  Performed at [facility]   Report Status PENDING   Incomplete  AFB CULTURE, BLOOD     Status: None   Collection Time    10/08/13 12:39 PM      Result Value Ref Range Status   Specimen Description BLOOD LEFT ARM  5 ML IN [facility] BOTTLE   Final   Special Requests NONE   Final   Culture     Final   Value: CULTURE WILL BE EXAMINED FOR 6 WEEKS BEFORE ISSUING A FINAL REPORT     Performed at [facility]   Report Status PENDING   Incomplete  CULTURE, BLOOD (ROUTINE X 2)     Status: None   Collection Time    10/08/13 12:39 PM      Result Value Ref Range Status   Specimen Description BLOOD LEFT ARM  5 ML IN [facility] BOTTLE   Final   Special Requests NONE   Final   Culture  Setup Time     Final   Value: 10/08/2013 17:24     Performed at [facility]   Culture     Final   Value:        BLOOD CULTURE RECEIVED NO GROWTH TO DATE CULTURE WILL BE HELD FOR 5 DAYS BEFORE ISSUING A FINAL NEGATIVE REPORT     Performed at [facility]   Report Status PENDING   Incomplete  RESPIRATORY VIRUS  PANEL     Status: Abnormal   Collection Time    10/08/13  4:41 PM      Result Value Ref Range Status   Source - RVPAN NASAL SWAB   Corrected   Comment: CORRECTED ON 04/13 AT 2130: PREVIOUSLY REPORTED AS NASAL SWAB   Respiratory Syncytial Virus A NOT DETECTED   Final   Respiratory Syncytial Virus B NOT DETECTED   Final   Influenza A NOT DETECTED   Final   Influenza B NOT DETECTED   Final   Parainfluenza 1 NOT DETECTED   Final   Parainfluenza 2 DETECTED (*)  Final   Parainfluenza 3 NOT DETECTED   Final   Metapneumovirus NOT DETECTED   Final   Rhinovirus NOT DETECTED   Final   Adenovirus NOT DETECTED   Final   Influenza A H1 NOT DETECTED   Final   Influenza A H3 NOT DETECTED   Final   Comment: (NOTE)           Normal Reference Range for each Analyte: NOT DETECTED     Testing performed using the Luminex xTAG Respiratory Viral Panel test     kit.     This test was developed and its performance characteristics determined     by [facility]. It has not been cleared or approved by the [place]     Food and Drug Administration. This test is used for clinical purposes.     It should not be regarded as investigational or for research. This     laboratory is certified under the [place] (CLIA) as qualified to perform high complexity     clinical laboratory testing.     Performed at [facility]     Status: None   Collection Time    10/10/13 10:45 AM      Result Value Ref Range Status   Specimen Description BRONCHIAL ALVEOLAR LAVAGE   Final   Special Requests Immunocompromised   Final  Fungal Smear     Final   Value: NO YEAST OR FUNGAL ELEMENTS SEEN     Performed at [facility]   Report Status 10/11/2013 FINAL   Final  FUNGUS CULTURE W SMEAR     Status: None   Collection Time    10/10/13 10:52 AM      Result Value Ref Range Status   Specimen Description BRONCHIAL ALVEOLAR LAVAGE   Final   Special Requests  Immunocompromised   Final   Fungal Smear     Final   Value: NO YEAST OR FUNGAL ELEMENTS SEEN     Performed at [facility]   Culture     Final   Value: CULTURE IN PROGRESS FOR FOUR WEEKS     Performed at [facility]   Report Status PENDING   Incomplete  CULTURE, BAL-QUANTITATIVE     Status: None   Collection Time    10/10/13 10:54 AM      Result Value Ref Range Status   Specimen Description BRONCHIAL ALVEOLAR LAVAGE   Final   Special Requests NONE   Final   Gram Stain     Final   Value: RARE WBC PRESENT,BOTH PMN AND MONONUCLEAR     NO SQUAMOUS EPITHELIAL CELLS SEEN     NO ORGANISMS SEEN     Performed at [facility]   Colony Count     Final   Value: 100 COLONIES/ML     Performed at [facility]   Culture     Final   Value: Non-Pathogenic Oropharyngeal-type Flora Isolated.     Performed at [facility]   Report Status 10/12/2013 FINAL   Final    Studies/Results: Dg Chest Port 1 View  10/12/2013   CLINICAL DATA:  Hypoxia  EXAM: PORTABLE CHEST - 1 VIEW  COMPARISON:  October 11, 2013  FINDINGS: Endotracheal tube tip is 1.6 cm above the carina. Central catheter tip is in the superior vena cava. Nasogastric tube tip and side port are below the diaphragm. No pneumothorax. Patchy airspace disease throughout both lungs is again noted without appreciable change. No new opacity is seen. Heart size and pulmonary vascularity are normal. No adenopathy.  IMPRESSION: Tube and catheter positions as described without pneumothorax. Suggest withdrawing endotracheal tube approximately 2.5 cm. Patchy airspace disease bilaterally is stable compared to 1 day prior. No new opacity.   Electronically Signed   By: William  Woodruff M.D.   On: 10/12/2013 07:03   Dg Chest Port 1 View  10/11/2013   CLINICAL DATA:  Check ETT position.  EXAM: PORTABLE CHEST - 1 VIEW  COMPARISON:  10/10/2013  FINDINGS: Endotracheal tube is 3 cm above the carina. Right central line and NG tube  are unchanged.  Continued bilateral patchy airspace opacities throughout the lungs. Left mid lung airspace opacity slightly improved. Otherwise no real change. No visible effusions or pneumothorax. No acute bony abnormality.  IMPRESSION: Patchy bilateral airspace disease continues with slight improved aeration in the left mid lung.   Electronically Signed   By: Kevin  Dover M.D.   On: 10/11/2013 07:37    Assessment: He is requiring less ventilatory support today and his FiO2 has come down to 50%.  Plan: 1. Continue trimethoprim-sulfamethoxazole and ganciclovir 2. His methylprednisolone dose has been reduced 3. Continue antiretroviral therapy   , MD [facility] [phone] pager   [phone] cell 10/12/2013, 4:03 PM      

## 2013-10-12 NOTE — Progress Notes (Signed)
*  PRELIMINARY RESULTS* Vascular Ultrasound Right upper extremity venous duplex has been completed.  Preliminary findings:  No evidence of DVT or superficial thrombosis.     [place] ChardJill I Eunice RVT 10/12/2013, 11:04 AM

## 2013-10-13 ENCOUNTER — Inpatient Hospital Stay (HOSPITAL_COMMUNITY): Payer: BC Managed Care – PPO

## 2013-10-13 LAB — GLUCOSE, CAPILLARY
GLUCOSE-CAPILLARY: 160 mg/dL — AB (ref 70–99)
Glucose-Capillary: 117 mg/dL — ABNORMAL HIGH (ref 70–99)
Glucose-Capillary: 111 mg/dL — ABNORMAL HIGH (ref 70–99)
Glucose-Capillary: 147 mg/dL — ABNORMAL HIGH (ref 70–99)
Glucose-Capillary: 127 mg/dL — ABNORMAL HIGH (ref 70–99)
Glucose-Capillary: 131 mg/dL — ABNORMAL HIGH (ref 70–99)

## 2013-10-13 LAB — COMPREHENSIVE METABOLIC PANEL
ALBUMIN: 1.7 g/dL — AB (ref 3.5–5.2)
ALT: 62 U/L — ABNORMAL HIGH (ref 0–53)
AST: 42 U/L — AB (ref 0–37)
Alkaline Phosphatase: 64 U/L (ref 39–117)
BUN: 26 mg/dL — ABNORMAL HIGH (ref 6–23)
CO2: 39 mEq/L — ABNORMAL HIGH (ref 19–32)
Calcium: 8.3 mg/dL — ABNORMAL LOW (ref 8.4–10.5)
Chloride: 87 mEq/L — ABNORMAL LOW (ref 96–112)
Creatinine, Ser: 0.64 mg/dL (ref 0.50–1.35)
GFR calc Af Amer: >90 mL/min (ref >90)
GFR calc non Af Amer: >90 mL/min (ref >90)
Glucose, Bld: 111 mg/dL — ABNORMAL HIGH (ref 70–99)
POTASSIUM: 3.6 meq/L — AB (ref 3.7–5.3)
Sodium: 132 mEq/L — ABNORMAL LOW (ref 137–147)
TOTAL PROTEIN: 4.9 g/dL — AB (ref 6.0–8.3)
Total Bilirubin: <0.2 mg/dL — ABNORMAL LOW (ref 0.3–1.2)

## 2013-10-13 LAB — MAGNESIUM: Magnesium: 2 mg/dL (ref 1.5–2.5)

## 2013-10-13 LAB — CBC
HCT: 23.1 % — ABNORMAL LOW (ref 39.0–52.0)
Hemoglobin: 7.6 g/dL — ABNORMAL LOW (ref 13.0–17.0)
MCH: 29.9 pg (ref 26.0–34.0)
MCHC: 32.9 g/dL (ref 30.0–36.0)
MCV: 90.9 fL (ref 78.0–100.0)
PLATELETS: 230 10*3/uL (ref 150–400)
RBC: 2.54 MIL/uL — ABNORMAL LOW (ref 4.22–5.81)
RDW: 16 % — AB (ref 11.5–15.5)
WBC: 9.8 10*3/uL (ref 4.0–10.5)

## 2013-10-13 LAB — PHOSPHORUS: Phosphorus: 4.1 mg/dL (ref 2.3–4.6)

## 2013-10-13 LAB — CMV (CYTOMEGALOVIRUS) DNA ULTRAQUANT, PCR: CMV DNA QUANT: 101862 {copies}/mL — AB

## 2013-10-13 MED ORDER — DEXTROSE 5 % IV SOLN
2.0000 g | Freq: Once | INTRAVENOUS | Status: AC
  Administered 2013-10-13: 2 g via INTRAVENOUS
  Filled 2013-10-13: qty 2

## 2013-10-13 MED ORDER — VANCOMYCIN HCL IN DEXTROSE 1-5 GM/200ML-% IV SOLN
1000.0000 mg | Freq: Three times a day (TID) | INTRAVENOUS | Status: DC
  Administered 2013-10-13 – 2013-10-15 (×5): 1000 mg via INTRAVENOUS
  Filled 2013-10-13 (×6): qty 200

## 2013-10-13 MED ORDER — DEXTROSE 5 % IV SOLN
2.0000 g | Freq: Three times a day (TID) | INTRAVENOUS | Status: DC
  Administered 2013-10-13 – 2013-10-15 (×5): 2 g via INTRAVENOUS
  Filled 2013-10-13 (×6): qty 2

## 2013-10-13 MED ORDER — MIDAZOLAM HCL 2 MG/2ML IJ SOLN
4.0000 mg | Freq: Once | INTRAMUSCULAR | Status: AC
  Administered 2013-10-13: 4 mg via INTRAVENOUS

## 2013-10-13 MED ORDER — VANCOMYCIN HCL 10 G IV SOLR
1750.0000 mg | Freq: Once | INTRAVENOUS | Status: AC
  Administered 2013-10-13: 1750 mg via INTRAVENOUS
  Filled 2013-10-13: qty 1750

## 2013-10-13 MED ORDER — POTASSIUM CHLORIDE 20 MEQ/15ML (10%) PO LIQD
20.0000 meq | ORAL | Status: AC
  Administered 2013-10-13 (×2): 20 meq
  Filled 2013-10-13 (×2): qty 30

## 2013-10-13 NOTE — Progress Notes (Signed)
:    [id number]/Rhonda Earlene Plateravis, RN, BSN, CCM [phone] Chart Reviewed for discharge and hospital needs. remains vent dependant/ards,multiple critical medical problems. Discharge needs at time of review: None present will follow for needs. Review of patient progress due on [id number].

## 2013-10-13 NOTE — Progress Notes (Signed)
Patient ID: William Bender, male   DOB: Sep 19, 1965, 48 y.o.   MRN: [id number]        [place] [facility]    Date of Admission:  10/22/2013           Day 13 trimethoprim-sulfamethoxazole steroids        Day 11 ganciclovir Principal Problem:   Pneumonia Active Problems:   Acute respiratory failure   AIDS   PCP (pneumocystis carinii pneumonia)   CMV (cytomegalovirus)   Sacral decubitus ulcer, stage II   Hyponatremia   Pneumomediastinum   . antiseptic oral rinse  15 mL Mouth Rinse QID  . aspirin  81 mg Oral Daily  . azithromycin  1,200 mg Oral Weekly  . ceFEPime (MAXIPIME) IV  2 g Intravenous Q8H  . chlorhexidine  15 mL Mouth Rinse BID  . docusate  100 mg Oral BID  . dolutegravir  50 mg Oral Daily  . emtricitabine-tenofovir  1 tablet Oral Daily  . enoxaparin (LOVENOX) injection  40 mg Subcutaneous Daily  . feeding supplement (OXEPA)  1,000 mL Per Tube Q24H  . feeding supplement (PRO-STAT SUGAR FREE 64)  30 mL Per Tube QID  . fluconazole  100 mg Oral Weekly  . furosemide  40 mg Intravenous Q12H  . ganciclovir (CYTOVENE) IV  5 mg/kg Intravenous Q12H  . insulin aspart  0-15 Units Subcutaneous 6 times per day  . methylPREDNISolone (SOLU-MEDROL) injection  40 mg Intravenous Q12H  . pantoprazole sodium  40 mg Per Tube Daily  . QUEtiapine  50 mg Per Tube BID  . sulfamethoxazole-trimethoprim  450 mg Intravenous Q8H  . vancomycin  1,000 mg Intravenous Q8H   Objective: Temp:  [97.9 F (36.6 C)-101.1 F (38.4 C)] 98 F (36.7 C) (04/16 1200) Pulse Rate:  [86-135] 96 (04/16 1600) Resp:  [13-35] 19 (04/16 1600) BP: (97-173)/(44-86) 127/63 mmHg (04/16 1600) SpO2:  [88 %-99 %] 98 % (04/16 1600) FiO2 (%):  [50 %-100 %] 60 % (04/16 1606) Weight:  [74.2 kg (163 lb 9.3 oz)] 74.2 kg (163 lb 9.3 oz) (04/16 0600)  General: He had a transient episode of fever this morning accompanied by tachycardia and tachypnea. His FiO2 was bumped up to 100 but he is now back down to 60 and  his PEEP has been reduced to 8. His nurse reports that he has not required after suctioning. He remains sedated on the ventilator. Right IJ site appears normal Lungs: Clear anteriorly. Moderate amount of secretions being suctioned Cor: Regular S1 and S2 no murmurs Abdomen: No diarrhea No change in perirectal ulcers  Lab Results Lab Results  Component Value Date   WBC 9.8 10/13/2013   HGB 7.6* 10/13/2013   HCT 23.1* 10/13/2013   MCV 90.9 10/13/2013   PLT 230 10/13/2013    Lab Results  Component Value Date   CREATININE 0.64 10/13/2013   BUN 26* 10/13/2013   NA 132* 10/13/2013   K 3.6* 10/13/2013   CL 87* 10/13/2013   CO2 39* 10/13/2013    Lab Results  Component Value Date   ALT 62* 10/13/2013   AST 42* 10/13/2013   ALKPHOS 64 10/13/2013   BILITOT <0.2* 10/13/2013      Microbiology: Recent Results (from the past 240 hour(s))  CULTURE, BLOOD (ROUTINE X 2)     Status: None   Collection Time    10/08/13 12:29 PM      Result Value Ref Range Status   Specimen Description BLOOD RIGHT ARM  2.5 ML  IN [facility] BOTTLE   Final   Special Requests NONE   Final   Culture  Setup Time     Final   Value: 10/08/2013 17:24     Performed at [facility]   Culture     Final   Value:        BLOOD CULTURE RECEIVED NO GROWTH TO DATE CULTURE WILL BE HELD FOR 5 DAYS BEFORE ISSUING A FINAL NEGATIVE REPORT     Performed at [facility]   Report Status PENDING   Incomplete  AFB CULTURE, BLOOD     Status: None   Collection Time    10/08/13 12:39 PM      Result Value Ref Range Status   Specimen Description BLOOD LEFT ARM  5 ML IN [facility] BOTTLE   Final   Special Requests NONE   Final   Culture     Final   Value: CULTURE WILL BE EXAMINED FOR 6 WEEKS BEFORE ISSUING A FINAL REPORT     Performed at [facility]   Report Status PENDING   Incomplete  CULTURE, BLOOD (ROUTINE X 2)     Status: None   Collection Time    10/08/13 12:39 PM      Result Value Ref Range Status   Specimen Description  BLOOD LEFT ARM  5 ML IN [facility] BOTTLE   Final   Special Requests NONE   Final   Culture  Setup Time     Final   Value: 10/08/2013 17:24     Performed at [facility]   Culture     Final   Value:        BLOOD CULTURE RECEIVED NO GROWTH TO DATE CULTURE WILL BE HELD FOR 5 DAYS BEFORE ISSUING A FINAL NEGATIVE REPORT     Performed at [facility]   Report Status PENDING   Incomplete  RESPIRATORY VIRUS PANEL     Status: Abnormal   Collection Time    10/08/13  4:41 PM      Result Value Ref Range Status   Source - RVPAN NASAL SWAB   Corrected   Comment: CORRECTED ON 04/13 AT 2130: PREVIOUSLY REPORTED AS NASAL SWAB   Respiratory Syncytial Virus A NOT DETECTED   Final   Respiratory Syncytial Virus B NOT DETECTED   Final   Influenza A NOT DETECTED   Final   Influenza B NOT DETECTED   Final   Parainfluenza 1 NOT DETECTED   Final   Parainfluenza 2 DETECTED (*)  Final   Parainfluenza 3 NOT DETECTED   Final   Metapneumovirus NOT DETECTED   Final   Rhinovirus NOT DETECTED   Final   Adenovirus NOT DETECTED   Final   Influenza A H1 NOT DETECTED   Final   Influenza A H3 NOT DETECTED   Final   Comment: (NOTE)           Normal Reference Range for each Analyte: NOT DETECTED     Testing performed using the Luminex xTAG Respiratory Viral Panel test     kit.     This test was developed and its performance characteristics determined     by [facility]. It has not been cleared or approved by the [place]     Food and Drug Administration. This test is used for clinical purposes.     It should not be regarded as investigational or for research. This     laboratory is certified under the  Clinical Laboratory Improvement     Amendments of 1988 (CLIA) as qualified to perform high complexity     clinical laboratory testing.     Performed at [facility]     Status: None   Collection Time    10/10/13 10:45 AM      Result Value Ref Range Status   Specimen Description  BRONCHIAL ALVEOLAR LAVAGE   Final   Special Requests Immunocompromised   Final   Fungal Smear     Final   Value: NO YEAST OR FUNGAL ELEMENTS SEEN     Performed at [facility]   Report Status 10/11/2013 FINAL   Final  FUNGUS CULTURE W SMEAR     Status: None   Collection Time    10/10/13 10:52 AM      Result Value Ref Range Status   Specimen Description BRONCHIAL ALVEOLAR LAVAGE   Final   Special Requests Immunocompromised   Final   Fungal Smear     Final   Value: NO YEAST OR FUNGAL ELEMENTS SEEN     Performed at [facility]   Culture     Final   Value: CULTURE IN PROGRESS FOR FOUR WEEKS     Performed at [facility]   Report Status PENDING   Incomplete  CULTURE, BAL-QUANTITATIVE     Status: None   Collection Time    10/10/13 10:54 AM      Result Value Ref Range Status   Specimen Description BRONCHIAL ALVEOLAR LAVAGE   Final   Special Requests NONE   Final   Gram Stain     Final   Value: RARE WBC PRESENT,BOTH PMN AND MONONUCLEAR     NO SQUAMOUS EPITHELIAL CELLS SEEN     NO ORGANISMS SEEN     Performed at [facility]     Final   Value: 100 COLONIES/ML     Performed at [facility]   Culture     Final   Value: Non-Pathogenic Oropharyngeal-type Flora Isolated.     Performed at [facility]   Report Status 10/12/2013 FINAL   Final    Studies/Results: Dg Chest Port 1 View  10/13/2013   CLINICAL DATA:  Evaluate endotracheal tube placement.  EXAM: PORTABLE CHEST - 1 VIEW  COMPARISON:  DG CHEST 1V PORT dated 10/12/2013  FINDINGS: Endotracheal tube is 2.5 cm above the carina. There are persistent airspace densities throughout both lungs with mild sparing in the right upper lung. Heart size is within normal limits and stable. A nasogastric tube extends into the abdomen. Right jugular central line tip in the upper SVC region. Negative for a pneumothorax.  IMPRESSION: Support apparatuses as described.  No change in the  bilateral airspace disease.   Electronically Signed   By: Markus Daft M.D.   On: 10/13/2013 07:51   Dg Chest Port 1 View  10/12/2013   CLINICAL DATA:  Hypoxia  EXAM: PORTABLE CHEST - 1 VIEW  COMPARISON:  October 11, 2013  FINDINGS: Endotracheal tube tip is 1.6 cm above the carina. Central catheter tip is in the superior vena cava. Nasogastric tube tip and side port are below the diaphragm. No pneumothorax. Patchy airspace disease throughout both lungs is again noted without appreciable change. No new opacity is seen. Heart size and pulmonary vascularity are normal. No adenopathy.  IMPRESSION: Tube and catheter positions as described without pneumothorax. Suggest withdrawing endotracheal tube approximately 2.5 cm. Patchy airspace  disease bilaterally is stable compared to 1 day prior. No new opacity.   Electronically Signed   By: Lowella Grip M.D.   On: 10/12/2013 07:03    Assessment: He's developed some ICU fever but no clear evidence of worsening pneumonia. He has been recultured. It is possible that he is having breakthrough fevers after reduction of the steroids yesterday.  Plan: 1. Continue trimethoprim-sulfamethoxazole and ganciclovir ganciclovir 2. His methylprednisolone dose has been reduced 3. Cefepime was restarted this morning 4. Await culture results 5. Continue antiretroviral therapy 6. I discussed these recommendations with his father today  Michel Bickers, MD [facility] [phone] pager   [phone] cell 10/13/2013, 5:17 PM

## 2013-10-13 NOTE — Progress Notes (Signed)
PULMONARY / CRITICAL CARE MEDICINE   Name: William Bender MRN: [id number] DOB: 06/20/1966    ADMISSION DATE:  10/16/2013 CONSULTATION DATE:  10/04/2013  REFERRING MD :  Rancour  CHIEF COMPLAINT:  Shortness of breath, fever, cough  BRIEF PATIENT DESCRIPTION: 48 y/o male recently diagnosed with HIV/AIDS -CD4 =30 on 3/31 and treated empirically for PCP [facility] with acute hypoxemic respiratory failure. Note that sputum PCP was neg 3/18 but was on bactrim prior  SIGNIFICANT EVENTS: 4/09 Pneumomediastinum, nimbex added 4/10 desat, lasix started, peep improved to 10   4/13 ETT had migrated, re-advanced and verified w/ FOB. Obtained BAL at this time  4/16 Tm 101F  STUDIES:  4/4 CT angio > no PE, diffuse bilateral GGO and patchy consolidation worse in the bases L > R 4/9 CT chest > pneumomediastinum with subcutaneous emphysema, severe diffuse GGO b/l 4/15 RUE US>>> NEGATIVE  LINES / TUBES: 4/5 ETT >>4/13 (exchanged)>>> 4/5 R IJ CVL >>  CULTURES: 4/4 blood >> negative 4/4 urine hist >> negative 4/4 CMV pcr >> POSITIVE 4/5 PCP DFA BAL LUL >>negative 4/5 resp culture bal >>negative 4/5 fungal  bal >> 4/5 afb  bal >> 4/5 cmv bal >> negative 4/11 resp virus panel: + parainfluenza 2 4/13 aerobic culture >>> 4/13 fungal >>> 4/13 MTB PCR>>> CMV PCR 4/13>>> fungitell 4/13>>> BAL 4/13>>>NOF  ANTIBIOTICS: Per ID 4/4 Bactrim >> 4/4 Vanc >> 4/4 4/4 Cefepime >>4/14 Gancyclovir>>> 4/16 Vanc >>> 4/16 cefepime >>>   SUBJECTIVE:  Respiratory distress this AM with increased WOB, hypoxia >> increased FiO2, PEEP, sedation.  Fever this AM.  VITAL SIGNS: Temp:  [97.9 F (36.6 C)-98.6 F (37 C)] 98.4 F (36.9 C) (04/16 0400) Pulse Rate:  [87-124] 120 (04/16 0700) Resp:  [17-35] 29 (04/16 0700) BP: (95-173)/(50-91) 173/71 mmHg (04/16 0700) SpO2:  [88 %-97 %] 88 % (04/16 0700) FiO2 (%):  [40 %-60 %] 50 % (04/16 0428) Weight:  [163 lb 9.3 oz (74.2 kg)] 163 lb 9.3 oz  (74.2 kg) (04/16 0600) VENTILATOR SETTINGS: Vent Mode:  [-] PRVC FiO2 (%):  [40 %-60 %] 50 % Set Rate:  [26 bmp] 26 bmp Vt Set:  [440 mL] 440 mL PEEP:  [8 cmH20-10 cmH20] 8 cmH20 Plateau Pressure:  [18 cmH20-28 cmH20] 23 cmH20 INTAKE / OUTPUT: Intake/Output     04/15 0701 - 04/16 0700 04/16 0701 - 04/17 0700   I.V. (mL/kg) 1594.1 (21.5)    NG/GT 1020    IV Piggyback 1720    Total Intake(mL/kg) 4334.1 (58.4)    Urine (mL/kg/hr) 5275 (3)    Total Output 5275     Net -940.9            PHYSICAL EXAMINATION: Gen: increased WOB HEENT: ETT in place PULM: using accessory muscle, ETT in place CV: regular, tachycardic AB: soft, non tender Neuro: RASS -2 Ext: 1+ edema Derm: no rashes    LABS: PULMONARY  Recent Labs Lab 10/08/13 0355 10/08/13 1050 10/09/13 0355 10/10/13 0419 10/11/13 0400  PHART 7.458* 7.415 7.379 7.441 7.460*  PCO2ART 49.3* 60.2* 70.6* 66.1* 59.6*  PO2ART 175.0* 67.0* 89.5 88.2 87.0  HCO3 34.2* 37.9* 40.7* 44.3* 42.0*  TCO2 31.8 34.3 38.4 41.0 39.5  O2SAT 97.3 89.3 94.2 95.9 94.8    CBC  Recent Labs Lab 10/11/13 0414 10/12/13 0457 10/13/13 0400  HGB 8.9* 8.3* 7.6*  HCT 28.5* 24.8* 23.1*  WBC 9.6 13.2* 9.8  PLT 222 257 230    COAGULATION  Recent  Labs Lab 10/09/13 1045  INR 0.98    CHEMISTRY  Recent Labs Lab 10/09/13 0428 10/10/13 0605 10/11/13 0414 10/12/13 0457 10/13/13 0400  NA 132* 133* 131* 128* 132*  K 4.2 3.8 3.8 3.9 3.6*  CL 86* 84* 81* 82* 87*  CO2 41* 43* 43* 40* 39*  GLUCOSE 160* 172* 147* 186* 111*  BUN 31* 26* 23 25* 26*  CREATININE 0.71 0.63 0.61 0.52 0.64  CALCIUM 7.8* 7.6* 7.9* 8.2* 8.3*  MG  --   --  2.0 2.0 2.0  PHOS  --   --  3.4 3.3 4.1   Estimated Creatinine Clearance: 117.9 ml/min (by C-G formula based on Cr of 0.64).   LIVER  Recent Labs Lab 10/09/13 0428 10/09/13 1045 10/10/13 0605 10/11/13 0414 10/12/13 0457 10/13/13 0400  AST 37  --  40* 43* 52* 42*  ALT 40  --  40 46 53 62*   ALKPHOS 59  --  57 65 64 64  BILITOT <0.2*  --  <0.2* <0.2* <0.2* <0.2*  PROT 5.2*  --  4.9* 5.1* 5.0* 4.9*  ALBUMIN 1.6*  --  1.6* 1.7* 1.7* 1.7*  INR  --  0.98  --   --   --   --     INFECTIOUS  Recent Labs Lab 10/11/13 1100  LATICACIDVEN 2.4*    ENDOCRINE CBG (last 3)   Recent Labs  10/12/13 1634 10/12/13 2016 10/12/13 2313  GLUCAP 121* 150* 160*     IMAGING x48h  Dg Chest Port 1 View  10/12/2013   CLINICAL DATA:  Hypoxia  EXAM: PORTABLE CHEST - 1 VIEW  COMPARISON:  October 11, 2013  FINDINGS: Endotracheal tube tip is 1.6 cm above the carina. Central catheter tip is in the superior vena cava. Nasogastric tube tip and side port are below the diaphragm. No pneumothorax. Patchy airspace disease throughout both lungs is again noted without appreciable change. No new opacity is seen. Heart size and pulmonary vascularity are normal. No adenopathy.  IMPRESSION: Tube and catheter positions as described without pneumothorax. Suggest withdrawing endotracheal tube approximately 2.5 cm. Patchy airspace disease bilaterally is stable compared to 1 day prior. No new opacity.   Electronically Signed   By: Bretta Bang M.D.   On: 10/12/2013 07:03    ASSESSMENT / PLAN:  PULMONARY A:  Acute respiratory failure with ARDS 2nd to Parainfluenza PNA and presumed PCP  in setting of HIV. Pneumomediastinum developed 4/09. ETT exchanged 4/13 d/t leak. Increased Oxygen needs 4/16. P:   F/u CXR Adjust PEEP/FiO2 to keep SpO2 > 88% Continue solumedrol 40 mg IV q12 h (per recs of ID to begin taper)with possible PCP and immune reconstitution Will need trach at some point  INFECTIOUS A:   PNA in setting of HIV >> viral pneumonitis (+ parainfluenza 2) +/-bacterial, PCP. Stage II buttock ulcers. CMV viremia. His parainfluenza 2 was positive >> clinically Significant with ARDS. Fever 101F on 4/16. P:   Add cefepime, vancomycin 4/16 Repeat blood, urine, sputum cx  CARDIOVASCULAR A:   Severe sepsis. Hx of HTN. 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 Hypervolemia. Mild contraction alkalosis. P:   Continue diuresis as tolerate with goal negative fluid balance Monitor renal fx, urine outpt, electrolytes  GASTROINTESTINAL A:   Protein calorie malnutrition. P:   Protonix for SUP Tube feeds to goal  HEMATOLOGIC A:   Anemia of critical illness and chronic disease. RUE swelling >>  doppler negative. P:  F/u CBC  Continue LMWH for DVT prevention  ENDOCRINE A:   Steroid induced hyperglycemia. P:   SSI  NEUROLOGIC A:    Anxiety, pain control, sedation >> changed to dilaudid 4/14. P:   Goal RASS -2  CC time 40 minutes.  Coralyn HellingVineet Philo Kurtz, MD [facility] 10/13/2013, 7:58 AM Pager:  [phone] After 3pm call: [phone]

## 2013-10-13 NOTE — Progress Notes (Signed)
.  [facility] FOR AM LAB REPLACEMENT ONLY The patient does apply for the [facility] Adult ICU Electrolyte Replacment Protocol based on the criteria listed below:   1. Is GFR >/= 40 ml/min? yes  Patient's GFR today is >90 2. Is urine output >/= 0.5 ml/kg/hr for the last 6 hours? yes Patient's UOP is 1.6 ml/kg/hr 3. Is BUN < 60 mg/dL? yes  Patient's BUN today is 26 4. Abnormal electrolyte(s): K 3.6 5. Ordered repletion with: per protocol 6. If a panic level lab has been reported, has the CCM MD in charge been notified? no.   Physician:    Darrick GrinderGretchin A Denelle Capurro 10/13/2013 6:56 AM'

## 2013-10-13 NOTE — Progress Notes (Signed)
ANTIBIOTIC CONSULT NOTE - INITIAL  Pharmacy Consult for Vancomycin, Cefepime, Bactrim Indication: Sepsis, CMV, PCP, and parainfluenza PNA  Allergies  Allergen Reactions  . Penicillins Rash    Patient Measurements: Height: _0  (177.8 cm) Weight: 163 lb 9.3 oz (74.2 kg) IBW/kg (Calculated) : 73 Adjusted Body Weight:   Vital Signs: Temp: 98.4 F (36.9 C) (04/16 0400) Temp src: Oral (04/16 0400) BP: 173/71 mmHg (04/16 0700) Pulse Rate: 120 (04/16 0700) Intake/Output from previous day: 04/15 0701 - 04/16 0700 In: 4334.1 [I.V.:1594.1; NG/GT:1020; IV Piggyback:1720] Out: 5275 [Urine:5275] Intake/Output from this shift:    Labs:  Recent Labs  10/11/13 0414 10/12/13 0457 10/13/13 0400  WBC 9.6 13.2* 9.8  HGB 8.9* 8.3* 7.6*  PLT 222 257 230  CREATININE 0.61 0.52 0.64   Estimated Creatinine Clearance: 117.9 ml/min (by C-G formula based on Cr of 0.64). No results found for this basename: VANCOTROUGH, VANCOPEAK, VANCORANDOM, GENTTROUGH, GENTPEAK, GENTRANDOM, TOBRATROUGH, TOBRAPEAK, TOBRARND, AMIKACINPEAK, AMIKACINTROU, AMIKACIN,  in the last 72 hours   Microbiology: Recent Results (from the past 720 hour(s))  CULTURE, BLOOD (ROUTINE X 2)     Status: None   Collection Time    09/13/13 11:05 AM      Result Value Ref Range Status   Specimen Description BLOOD LEFT ARM   Final   Special Requests BOTTLES DRAWN AEROBIC AND ANAEROBIC [facility] EACH   Final   Culture  Setup Time     Final   Value: 09/13/2013 14:03     Performed at [facility]   Culture     Final   Value: NO GROWTH 5 DAYS     Performed at [facility]   Report Status 09/19/2013 FINAL   Final  CULTURE, BLOOD (ROUTINE X 2)     Status: None   Collection Time    09/13/13 11:15 AM      Result Value Ref Range Status   Specimen Description BLOOD RIGHT ARM   Final   Special Requests BOTTLES DRAWN AEROBIC AND ANAEROBIC 10CC EACH   Final   Culture  Setup Time     Final   Value: 09/13/2013 14:03   Performed at [facility]   Culture     Final   Value: NO GROWTH 5 DAYS     Performed at [facility]   Report Status 09/19/2013 FINAL   Final  MRSA PCR SCREENING     Status: Abnormal   Collection Time    09/13/13  8:17 PM      Result Value Ref Range Status   MRSA by PCR INVALID RESULTS, SPECIMEN SENT FOR CULTURE (*) NEGATIVE Corrected   Comment: RESULT CALLED TO, READ BACK BY AND VERIFIED WITH:     SPOKE WITH BANKS,T RN [phone] COVINGTON,N     CORRECTED ON 03/18 AT 1155: PREVIOUSLY REPORTED AS RESULT CALLED TO, READ BACK BY AND VERIFIED WITH: SPOKE WITH BANKS,T RN [phone] COVINGTON,N        The GeneXpert MRSA Assay (FDA approved for NASAL specimens only), is one component of a      comprehensive MRSA colonization surveillance program. It is not intended to diagnose MRSA infection nor to guide or monitor treatment for MRSA infections.  MRSA CULTURE     Status: None   Collection Time    09/13/13  8:17 PM      Result Value Ref Range Status   Specimen Description NASOPHARYNGEAL   Final   Special Requests NONE   Final  Culture     Final   Value: NOMRSA     Performed at [facility]   Report Status 09/16/2013 FINAL   Final  RESPIRATORY VIRUS PANEL     Status: None   Collection Time    09/14/13 12:20 PM      Result Value Ref Range Status   Source - RVPAN NASAL SWAB   Corrected   Comment: CORRECTED ON 03/19 AT 1848: PREVIOUSLY REPORTED AS NASAL SWAB   Respiratory Syncytial Virus A NOT DETECTED   Final   Respiratory Syncytial Virus B NOT DETECTED   Final   Influenza A NOT DETECTED   Final   Influenza B NOT DETECTED   Final   Parainfluenza 1 NOT DETECTED   Final   Parainfluenza 2 NOT DETECTED   Final   Parainfluenza 3 NOT DETECTED   Final   Metapneumovirus NOT DETECTED   Final   Rhinovirus NOT DETECTED   Final   Adenovirus NOT DETECTED   Final   Influenza A H1 NOT DETECTED   Final   Influenza A H3 NOT DETECTED   Final   Comment: (NOTE)            Normal Reference Range for each Analyte: NOT DETECTED     Testing performed using the Luminex xTAG Respiratory Viral Panel test     kit.     This test was developed and its performance characteristics determined     by [facility]. It has not been cleared or approved by the [place]     Food and Drug Administration. This test is used for clinical purposes.     It should not be regarded as investigational or for research. This     laboratory is certified under the [place] (CLIA) as qualified to perform high complexity     clinical laboratory testing.     Performed at [place]     Status: None   Collection Time    09/14/13  9:15 PM      Result Value Ref Range Status   CT Probe RNA NEGATIVE  NEGATIVE Final   GC Probe RNA NEGATIVE  NEGATIVE Final   Comment: (NOTE)                                                                                               **Normal Reference Range: Negative**          Assay performed using the Gen-Probe APTIMA COMBO2 (R) Assay.     Acceptable specimen types for this assay include APTIMA Swabs (Unisex,     endocervical, urethral, or vaginal), first void urine, and ThinPrep     liquid based cytology samples.     Performed at [place] [facility]     Status: None   Collection Time    09/14/13 10:15 PM      Result Value Ref Range Status   Specimen Source-PJSRC SPUTUM   Final   Pneumocystis jiroveci Ag NEGATIVE   Final  Comment: Performed at [place], BLOOD (ROUTINE X 2)     Status: None   Collection Time    09/29/2013 10:20 AM      Result Value Ref Range Status   Specimen Description BLOOD LEFT ANTECUBITAL   Final   Special Requests BOTTLES DRAWN AEROBIC AND ANAEROBIC 5 CC EACH   Final   Culture  Setup Time     Final   Value: 10/23/2013 14:52     Performed at [facility]   Culture     Final    Value: NO GROWTH 5 DAYS     Performed at [facility]   Report Status 10/07/2013 FINAL   Final  CULTURE, BLOOD (ROUTINE X 2)     Status: None   Collection Time    10/12/2013 10:35 AM      Result Value Ref Range Status   Specimen Description BLOOD RIGHT ANTECUBITAL   Final   Special Requests BOTTLES DRAWN AEROBIC AND ANAEROBIC [facility] EACH   Final   Culture  Setup Time     Final   Value: 09/29/2013 14:52     Performed at [facility]   Culture     Final   Value: NO GROWTH 5 DAYS     Performed at [facility]   Report Status 10/07/2013 FINAL   Final  PNEUMOCYSTIS JIROVECI SMEAR BY [facility]     Status: None   Collection Time    10/02/13 10:08 AM      Result Value Ref Range Status   Specimen Source-PJSRC BRONCHIAL ALVEOLAR LAVAGE   Corrected   Comment: CORRECTED ON 04/05 AT 1024: PREVIOUSLY REPORTED AS BRONCHIAL WASHINGS   Pneumocystis jiroveci Ag NEGATIVE   Final   Comment: Performed at [place], RESPIRATORY (NON-EXPECTORATED)     Status: None   Collection Time    10/02/13 10:08 AM      Result Value Ref Range Status   Specimen Description BRONCHIAL ALVEOLAR LAVAGE   Final   Special Requests Immunocompromised   Final   Gram Stain     Final   Value: RARE WBC PRESENT, PREDOMINANTLY PMN     RARE SQUAMOUS EPITHELIAL CELLS PRESENT     NO ORGANISMS SEEN     Performed at [facility]   Culture     Final   Value: NO GROWTH 2 DAYS     Performed at [facility]   Report Status 10/04/2013 FINAL   Final  FUNGUS CULTURE W SMEAR     Status: None   Collection Time    10/02/13 10:08 AM      Result Value Ref Range Status   Specimen Description BRONCHIAL ALVEOLAR LAVAGE   Final   Special Requests Immunocompromised   Final   Fungal Smear     Final   Value: NO YEAST OR FUNGAL ELEMENTS SEEN     Performed at [facility]   Culture     Final   Value: CULTURE IN PROGRESS FOR FOUR WEEKS     Performed at [facility]    Report Status PENDING   Incomplete  AFB CULTURE WITH SMEAR     Status: None   Collection Time    10/02/13 10:08 AM      Result Value Ref Range Status   Specimen Description BRONCHIAL ALVEOLAR LAVAGE   Final   Special Requests Immunocompromised   Final   ACID  FAST SMEAR     Final   Value: NO ACID FAST BACILLI SEEN     Performed at [facility]   Culture     Final   Value: CULTURE WILL BE EXAMINED FOR 6 WEEKS BEFORE ISSUING A FINAL REPORT     Performed at [facility]   Report Status PENDING   Incomplete  CMV CULTURE CMVC     Status: None   Collection Time    10/02/13 10:08 AM      Result Value Ref Range Status   Specimen Description BRONCHIAL ALVEOLAR LAVAGE   Final   Special Requests Immunocompromised   Final   Culture     Final   Value: No Cytomegalovirus identified in cell culture                                                                A negative result does not exclude the possibility of CMV infection;inappropriate specimen collection,storage and transport may lead to false      negative results.     Performed at [facility]   Report Status 10/05/2013 FINAL   Final  CULTURE, BLOOD (ROUTINE X 2)     Status: None   Collection Time    10/08/13 12:29 PM      Result Value Ref Range Status   Specimen Description BLOOD RIGHT ARM  2.5 ML IN [facility] BOTTLE   Final   Special Requests NONE   Final   Culture  Setup Time     Final   Value: 10/08/2013 17:24     Performed at [facility]   Culture     Final   Value:        BLOOD CULTURE RECEIVED NO GROWTH TO DATE CULTURE WILL BE HELD FOR 5 DAYS BEFORE ISSUING A FINAL NEGATIVE REPORT     Performed at [facility]   Report Status PENDING   Incomplete  AFB CULTURE, BLOOD     Status: None   Collection Time    10/08/13 12:39 PM      Result Value Ref Range Status   Specimen Description BLOOD LEFT ARM  5 ML IN [facility] BOTTLE   Final   Special Requests NONE   Final   Culture     Final   Value: CULTURE  WILL BE EXAMINED FOR 6 WEEKS BEFORE ISSUING A FINAL REPORT     Performed at [facility]   Report Status PENDING   Incomplete  CULTURE, BLOOD (ROUTINE X 2)     Status: None   Collection Time    10/08/13 12:39 PM      Result Value Ref Range Status   Specimen Description BLOOD LEFT ARM  5 ML IN [facility] BOTTLE   Final   Special Requests NONE   Final   Culture  Setup Time     Final   Value: 10/08/2013 17:24     Performed at [facility]   Culture     Final   Value:        BLOOD CULTURE RECEIVED NO GROWTH TO DATE CULTURE WILL BE HELD FOR 5 DAYS BEFORE ISSUING A FINAL NEGATIVE REPORT     Performed at [facility]   Report Status PENDING  Incomplete  RESPIRATORY VIRUS PANEL     Status: Abnormal   Collection Time    10/08/13  4:41 PM      Result Value Ref Range Status   Source - RVPAN NASAL SWAB   Corrected   Comment: CORRECTED ON 04/13 AT 2130: PREVIOUSLY REPORTED AS NASAL SWAB   Respiratory Syncytial Virus A NOT DETECTED   Final   Respiratory Syncytial Virus B NOT DETECTED   Final   Influenza A NOT DETECTED   Final   Influenza B NOT DETECTED   Final   Parainfluenza 1 NOT DETECTED   Final   Parainfluenza 2 DETECTED (*)  Final   Parainfluenza 3 NOT DETECTED   Final   Metapneumovirus NOT DETECTED   Final   Rhinovirus NOT DETECTED   Final   Adenovirus NOT DETECTED   Final   Influenza A H1 NOT DETECTED   Final   Influenza A H3 NOT DETECTED   Final   Comment: (NOTE)           Normal Reference Range for each Analyte: NOT DETECTED     Testing performed using the Luminex xTAG Respiratory Viral Panel test     kit.     This test was developed and its performance characteristics determined     by [facility]. It has not been cleared or approved by the [place]     Food and Drug Administration. This test is used for clinical purposes.     It should not be regarded as investigational or for research. This     laboratory is certified under the [place] (CLIA) as qualified to perform high complexity     clinical laboratory testing.     Performed at [place]     Status: None   Collection Time    10/10/13 10:45 AM      Result Value Ref Range Status   Specimen Description BRONCHIAL ALVEOLAR LAVAGE   Final   Special Requests Immunocompromised   Final   Fungal Smear     Final   Value: NO YEAST OR FUNGAL ELEMENTS SEEN     Performed at [facility]   Report Status 10/11/2013 FINAL   Final  FUNGUS CULTURE W SMEAR     Status: None   Collection Time    10/10/13 10:52 AM      Result Value Ref Range Status   Specimen Description BRONCHIAL ALVEOLAR LAVAGE   Final   Special Requests Immunocompromised   Final   Fungal Smear     Final   Value: NO YEAST OR FUNGAL ELEMENTS SEEN     Performed at [facility]   Culture     Final   Value: CULTURE IN PROGRESS FOR FOUR WEEKS     Performed at [facility]   Report Status PENDING   Incomplete  CULTURE, BAL-QUANTITATIVE     Status: None   Collection Time    10/10/13 10:54 AM      Result Value Ref Range Status   Specimen Description BRONCHIAL ALVEOLAR LAVAGE   Final   Special Requests NONE   Final   Gram Stain     Final   Value: RARE WBC PRESENT,BOTH PMN AND MONONUCLEAR     NO SQUAMOUS EPITHELIAL CELLS SEEN     NO ORGANISMS SEEN     Performed at [place]  Final   Value: 100 COLONIES/ML     Performed at [facility]   Culture     Final   Value: Non-Pathogenic Oropharyngeal-type [place] Isolated.     Performed at [facility]   Report Status 10/12/2013 FINAL   Final    Medical History: Past Medical History  Diagnosis Date  . Hypertension   . Hyperlipidemia   . Chronic headaches   . HIV (human immunodeficiency virus infection)    Assessment: 38 yoM admitted 4/4 with tachycardia and SOB, concerning for ongoing/recurrent PCP pneumonia. PMH includes recent  hospitalization (3/17- 09/20/13) with newly diagnosed HIV, started on ART (Stribild), and treatment for PCP pneumonia. At discharge, he was taking Bactrim, but prematurely decreased his dosage. Pharmacy initially consulted to dose Vancomycin, Cefepime, and Bactrim. ID consult recommends repeating an entire new 22 day course for PCP PNA.  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  Since then, antiinfectives narrowed to bactrim and ganciclovir.  This morning, pt with respiratory distress and fever and pharmacy re-consulted to dose vancomycin and cefepime.    4/4 >> Vanc >> 4/4 4/4 >> Cefepime >> 4/14 4/4 >> Bactrim IV >> 4/6 >> ganciclovir >> 4/14 >> Qweek fluconazole 145m >> PTA >> Qweek azithro (MAC ppx) >> 4/15 >> Vanc >> 4/15 >> Cefepime >>  Tmax: 101 (verbal) WBCs: Improved back to 9.8K Renal: Stable, CrCl >100 ml/min  Goal of Therapy:  Vancomycin trough level 15-20 mcg/ml Eradication of infection  Plan:  Vancomycin 17576mIV x1, then 1g IV q8 hours Cefepime 2g IV q8h Continue bactrim and ganciclovir at current doses F/u VT at [facility], renal fxn, clinical course  CoRalene BathePharmD, BCPS 10/13/2013, 8:19 AM  Pager: [phone]

## 2013-10-14 ENCOUNTER — Inpatient Hospital Stay (HOSPITAL_COMMUNITY): Payer: BC Managed Care – PPO

## 2013-10-14 LAB — BASIC METABOLIC PANEL
BUN: 21 mg/dL (ref 6–23)
CHLORIDE: 89 meq/L — AB (ref 96–112)
CO2: 36 meq/L — AB (ref 19–32)
CREATININE: 0.56 mg/dL (ref 0.50–1.35)
Calcium: 7.6 mg/dL — ABNORMAL LOW (ref 8.4–10.5)
GFR calc Af Amer: >90 mL/min (ref >90)
GFR calc non Af Amer: >90 mL/min (ref >90)
GLUCOSE: 132 mg/dL — AB (ref 70–99)
Potassium: 3.6 mEq/L — ABNORMAL LOW (ref 3.7–5.3)
Sodium: 132 mEq/L — ABNORMAL LOW (ref 137–147)

## 2013-10-14 LAB — CBC
HCT: 21 % — ABNORMAL LOW (ref 39.0–52.0)
Hemoglobin: 6.8 g/dL — CL (ref 13.0–17.0)
MCH: 29.2 pg (ref 26.0–34.0)
MCHC: 31.4 g/dL (ref 30.0–36.0)
MCV: 92.9 fL (ref 78.0–100.0)
PLATELETS: 207 10*3/uL (ref 150–400)
RBC: 2.26 MIL/uL — ABNORMAL LOW (ref 4.22–5.81)
RDW: 16.3 % — ABNORMAL HIGH (ref 11.5–15.5)
WBC: 8 10*3/uL (ref 4.0–10.5)

## 2013-10-14 LAB — CULTURE, BLOOD (ROUTINE X 2)
CULTURE: NO GROWTH
Culture: NO GROWTH

## 2013-10-14 LAB — URINE CULTURE: Colony Count: 5000

## 2013-10-14 LAB — FUNGAL ANTIBODIES PANEL, ID-BLOOD
ASPERGILLUS FLAVUS ANTIBODIES: NEGATIVE
Aspergillus Niger Antibodies: NEGATIVE
Aspergillus fumigatus: NEGATIVE
Blastomyces Abs, Qn, DID: NEGATIVE
Coccidioides Antibody ID: NEGATIVE
Histoplasma Ab, Immunodiffusion: NEGATIVE

## 2013-10-14 LAB — ABO/RH: ABO/RH(D): A POS

## 2013-10-14 LAB — M. TUBERCULOSIS COMPLEX BY PCR: M TUBERCULOSISDIRECT: NOT DETECTED

## 2013-10-14 LAB — GLUCOSE, CAPILLARY
GLUCOSE-CAPILLARY: 136 mg/dL — AB (ref 70–99)
GLUCOSE-CAPILLARY: 143 mg/dL — AB (ref 70–99)
GLUCOSE-CAPILLARY: 161 mg/dL — AB (ref 70–99)
Glucose-Capillary: 163 mg/dL — ABNORMAL HIGH (ref 70–99)
Glucose-Capillary: 118 mg/dL — ABNORMAL HIGH (ref 70–99)
Glucose-Capillary: 167 mg/dL — ABNORMAL HIGH (ref 70–99)

## 2013-10-14 LAB — PREPARE RBC (CROSSMATCH)

## 2013-10-14 LAB — PHOSPHORUS: PHOSPHORUS: 4.1 mg/dL (ref 2.3–4.6)

## 2013-10-14 LAB — MAGNESIUM: MAGNESIUM: 2 mg/dL (ref 1.5–2.5)

## 2013-10-14 MED ORDER — PRO-STAT SUGAR FREE PO LIQD
30.0000 mL | Freq: Every day | ORAL | Status: DC
  Administered 2013-10-14 – 2013-10-17 (×12): 30 mL
  Filled 2013-10-14 (×18): qty 30

## 2013-10-14 MED ORDER — POTASSIUM CHLORIDE 20 MEQ/15ML (10%) PO LIQD
20.0000 meq | ORAL | Status: AC
  Administered 2013-10-14 (×2): 20 meq
  Filled 2013-10-14 (×2): qty 15

## 2013-10-14 MED ORDER — SODIUM CHLORIDE 0.9 % IJ SOLN: 10.0000 mL | INTRAMUSCULAR | Status: DC | PRN

## 2013-10-14 MED ORDER — ADULT MULTIVITAMIN LIQUID CH
5.0000 mL | Freq: Every day | ORAL | Status: DC
  Administered 2013-10-14 – 2013-11-05 (×23): 5 mL
  Filled 2013-10-14 (×24): qty 5

## 2013-10-14 MED ORDER — SODIUM CHLORIDE 0.9 % IJ SOLN
10.0000 mL | Freq: Two times a day (BID) | INTRAMUSCULAR | Status: DC
  Administered 2013-10-14: 10 mL
  Administered 2013-10-14: 30 mL
  Administered 2013-10-15 – 2013-10-29 (×19): 10 mL
  Administered 2013-10-30 – 2013-11-01 (×5): 20 mL
  Administered 2013-11-01: 30 mL
  Administered 2013-11-02: 10 mL
  Administered 2013-11-03: 20 mL
  Administered 2013-11-04: 10 mL
  Administered 2013-11-05 (×2): 20 mL

## 2013-10-14 NOTE — Progress Notes (Signed)
eLink Physician-Brief Progress Note Patient Name: William Bender DOB: 02/19/1966 MRN: [id number]  Date of Service  10/14/2013   HPI/Events of Note   hgb 6.8  eICU Interventions  1 unit prbc   Intervention Category Intermediate Interventions: Hypervolemia - evaluation and management  Nelda BucksDaniel J Ruble Pumphrey 10/14/2013, 4:29 AM

## 2013-10-14 NOTE — Progress Notes (Signed)
PULMONARY / CRITICAL CARE MEDICINE   Name: William Bender MRN: [id number] DOB: 11/13/1965    ADMISSION DATE:  10/13/2013 CONSULTATION DATE:  10/07/2013  REFERRING MD :  Rancour  CHIEF COMPLAINT:  Shortness of breath, fever, cough  BRIEF PATIENT DESCRIPTION: 48 y/o male recently diagnosed with HIV/AIDS -CD4 =30 on 3/31 and treated empirically for PCP [facility] with acute hypoxemic respiratory failure. Note that sputum PCP was neg 3/18 but was on bactrim prior  SIGNIFICANT EVENTS: 4/09 Pneumomediastinum, nimbex added 4/10 desat, lasix started, peep improved to 10   4/13 ETT had migrated, re-advanced and verified w/ FOB. Obtained BAL at this time  4/16 Tm 101F 4/17 1 unit PRBC for Hb 6.8  STUDIES:  4/4 CT angio > no PE, diffuse bilateral GGO and patchy consolidation worse in the bases L > R 4/9 CT chest > pneumomediastinum with subcutaneous emphysema, severe diffuse GGO b/l 4/15 RUE US>>> NEGATIVE  LINES / TUBES: 4/5 ETT >>4/13 (exchanged)>>> 4/5 R IJ CVL >>  CULTURES: 4/4 blood >> negative 4/4 urine hist >> negative 4/4 CMV pcr >> POSITIVE 4/5 PCP DFA BAL LUL >>negative 4/5 resp culture bal >>negative 4/5 fungal  bal >> 4/5 afb  bal >> 4/5 cmv bal >> negative 4/11 resp virus panel: + parainfluenza 2 4/13 aerobic culture >>> 4/13 fungal >>> 4/13 MTB PCR>>> CMV PCR 4/13>>> fungitell 4/13>>> BAL 4/13>>>NOF  ANTIBIOTICS: Per ID 4/4 Bactrim >> 4/4 Vanc >> 4/4 4/4 Cefepime >>4/14 Gancyclovir>>> 4/16 Vanc >>> 4/16 cefepime >>>   SUBJECTIVE:  WOB better.  Down to 50% FiO2, but remains on PEEP 10.  No recurrence of fever.  VITAL SIGNS: Temp:  [97.6 F (36.4 C)-101.1 F (38.4 C)] 98.6 F (37 C) (04/17 0400) Pulse Rate:  [84-135] 123 (04/17 0700) Resp:  [12-30] 21 (04/17 0700) BP: (97-152)/(44-76) 143/65 mmHg (04/17 0700) SpO2:  [89 %-99 %] 89 % (04/17 0700) FiO2 (%):  [50 %-100 %] 50 % (04/17 0415) Weight:  [163 lb 5.8 oz (74.1 kg)] 163 lb 5.8 oz  (74.1 kg) (04/17 0023) VENTILATOR SETTINGS: Vent Mode:  [-] PRVC FiO2 (%):  [50 %-100 %] 50 % Set Rate:  [26 bmp] 26 bmp Vt Set:  [440 mL] 440 mL PEEP:  [10 cmH20] 10 cmH20 Plateau Pressure:  [17 cmH20-26 cmH20] 24 cmH20 INTAKE / OUTPUT: Intake/Output     04/16 0701 - 04/17 0700 04/17 0701 - 04/18 0700   I.V. (mL/kg) 1344.3 (18.1)    NG/GT 830    IV Piggyback 2750    Total Intake(mL/kg) 4924.3 (66.5)    Urine (mL/kg/hr) 5730 (3.2)    Total Output 5730     Net -805.7            PHYSICAL EXAMINATION: Gen: no distress HEENT: ETT in place PULM: scattered rhonchi, no wheeze CV: regular, tachycardic AB: soft, non tender Neuro: RASS -2 Ext: 1+ edema Derm: sacral decub noted   LABS: PULMONARY  Recent Labs Lab 10/08/13 0355 10/08/13 1050 10/09/13 0355 10/10/13 0419 10/11/13 0400  PHART 7.458* 7.415 7.379 7.441 7.460*  PCO2ART 49.3* 60.2* 70.6* 66.1* 59.6*  PO2ART 175.0* 67.0* 89.5 88.2 87.0  HCO3 34.2* 37.9* 40.7* 44.3* 42.0*  TCO2 31.8 34.3 38.4 41.0 39.5  O2SAT 97.3 89.3 94.2 95.9 94.8    CBC  Recent Labs Lab 10/12/13 0457 10/13/13 0400 10/14/13 0345  HGB 8.3* 7.6* 6.8*  HCT 24.8* 23.1* 21.0*  WBC 13.2* 9.8 8.0  PLT 257 230 207  COAGULATION  Recent Labs Lab 10/09/13 1045  INR 0.98    CHEMISTRY  Recent Labs Lab 10/10/13 0605 10/11/13 0414 10/12/13 0457 10/13/13 0400 10/14/13 0345  NA 133* 131* 128* 132* 132*  K 3.8 3.8 3.9 3.6* 3.6*  CL 84* 81* 82* 87* 89*  CO2 43* 43* 40* 39* 36*  GLUCOSE 172* 147* 186* 111* 132*  BUN 26* 23 25* 26* 21  CREATININE 0.63 0.61 0.52 0.64 0.56  CALCIUM 7.6* 7.9* 8.2* 8.3* 7.6*  MG  --  2.0 2.0 2.0 2.0  PHOS  --  3.4 3.3 4.1 4.1   Estimated Creatinine Clearance: 117.9 ml/min (by C-G formula based on Cr of 0.56).   LIVER  Recent Labs Lab 10/09/13 0428 10/09/13 1045 10/10/13 0605 10/11/13 0414 10/12/13 0457 10/13/13 0400  AST 37  --  40* 43* 52* 42*  ALT 40  --  40 46 53 62*  ALKPHOS 59  --   57 65 64 64  BILITOT <0.2*  --  <0.2* <0.2* <0.2* <0.2*  PROT 5.2*  --  4.9* 5.1* 5.0* 4.9*  ALBUMIN 1.6*  --  1.6* 1.7* 1.7* 1.7*  INR  --  0.98  --   --   --   --     INFECTIOUS  Recent Labs Lab 10/11/13 1100  LATICACIDVEN 2.4*    ENDOCRINE CBG (last 3)   Recent Labs  10/13/13 1942 10/13/13 2335 10/14/13 0311  GLUCAP 131* 161* 136*     IMAGING x48h  Dg Chest Port 1 View  10/14/2013   CLINICAL DATA:  Pneumonia  EXAM: PORTABLE CHEST - 1 VIEW  COMPARISON:  DG CHEST 1V PORT dated 10/13/2013  FINDINGS: Endotracheal tube is appropriately positioned. Nasogastric tube tip terminates below the level of the hemidiaphragms but is not included in the field of view. Confluent left greater than right diffuse airspace opacity pattern with air bronchogram formation again noted. No pneumothorax or pleural effusion. Right IJ central line tip terminates over the mid SVC. No acute osseous finding. Left glenohumeral joint degenerative change reidentified.  IMPRESSION: No significant change in diffuse bilateral airspace opacity pattern that could represent pneumonia or other alveolar filling process, although ARDS could have a similar appearance.   Electronically Signed   By: Christiana PellantGretchen  Green M.D.   On: 10/14/2013 07:14   Dg Chest Port 1 View  10/13/2013   CLINICAL DATA:  Evaluate endotracheal tube placement.  EXAM: PORTABLE CHEST - 1 VIEW  COMPARISON:  DG CHEST 1V PORT dated 10/12/2013  FINDINGS: Endotracheal tube is 2.5 cm above the carina. There are persistent airspace densities throughout both lungs with mild sparing in the right upper lung. Heart size is within normal limits and stable. A nasogastric tube extends into the abdomen. Right jugular central line tip in the upper SVC region. Negative for a pneumothorax.  IMPRESSION: Support apparatuses as described.  No change in the bilateral airspace disease.   Electronically Signed   By: Richarda OverlieAdam  Henn M.D.   On: 10/13/2013 07:51    ASSESSMENT /  PLAN:  PULMONARY A:  Acute respiratory failure with ARDS 2nd to Parainfluenza PNA and presumed PCP  in setting of HIV. Pneumomediastinum developed 4/09. ETT exchanged 4/13 d/t leak. Increased Oxygen needs 4/16 >> better 4/17. P:   F/u CXR Adjust PEEP/FiO2 to keep SpO2 > 88% Continue solumedrol 40 mg IV q12 h (per recs of ID to begin taper)with possible PCP and immune reconstitution Will need trach at some point  INFECTIOUS A:  PNA in setting of HIV >> viral pneumonitis (+ parainfluenza 2) +/-bacterial, PCP. Stage II buttock ulcers. CMV viremia. His parainfluenza 2 was positive >> clinically Significant with ARDS. Fever 101F on 4/16. P:   Added cefepime, vancomycin 4/16 Repeat blood, urine, sputum cx Will have IV team place PICC and then d/c Rt IJ CVL Wound care consult to assess sacral decub  CARDIOVASCULAR A:  Severe sepsis. Hx of HTN. 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 Hypervolemia. Mild contraction alkalosis. P:   Continue diuresis as tolerate with goal negative fluid balance Monitor renal fx, urine outpt, electrolytes  GASTROINTESTINAL A:   Protein calorie malnutrition. P:   Protonix for SUP Tube feeds to goal  HEMATOLOGIC A:   Anemia of critical illness and chronic disease >> no evidence for bleeding. RUE swelling >> doppler negative from 4/15. P:  F/u CBC after transfusion 4/17 Continue LMWH for DVT prevention  ENDOCRINE A:   Steroid induced hyperglycemia. P:   SSI  NEUROLOGIC A:    Anxiety, pain control, sedation >> changed to dilaudid 4/14. P:   Goal RASS -2  CC time 35 minutes.  Coralyn Helling, MD [facility] Pulmonary/Critical Care 10/14/2013, 7:39 AM Pager:  [phone] After 3pm call: [phone]

## 2013-10-14 NOTE — Progress Notes (Signed)
10/14/13 1000  Clinical Encounter Type  Visited With Patient and family together (mom William Bender)  Visit Type Follow-up;Spiritual support;Social support  Spiritual Encounters  Spiritual Needs Prayer;Emotional   Continuing to follow William Bender and his family for pastoral support and prayer, which they value deeply.  Pt's mom William Bender was grateful to share that her other son is flying in from [place] this weekend to spend time with William Bender and family.  We talked about the emotional/spiritual significance and healing value of such a gift of presence and participation, even when connection may be limited to nonverbal communication.  Offered encouragement, witness, and prayer at bedside.  Please also page as needs arise.  Thank you.  [address]Chaplain Dalayla Aldredge [place], [place] [phone]

## 2013-10-14 NOTE — Progress Notes (Signed)
[facility]OC[facility] FOR AM LAB REPLACEMENT ONLY  The patient does [facility] based on the criteria listed below:   1. Is GFR >/= 40 ml/min? yes  Patient's GFR today is >90 2. Is urine output >/= 0.5 ml/kg/hr for the last 6 hours? yes Patient's UOP is 3.5 ml/kg/hr 3. Is BUN < 60 mg/dL? yes  Patient's BUN today is 21 4. Abnormal electrolyte(s): K 3.6 5. Ordered repletion with: per protocol 6. If a panic level lab has been reported, has the CCM MD in charge been notified? no.   Physician:    Darrick GrinderGretchin A Nayana Lenig 10/14/2013 5:46 AM

## 2013-10-14 NOTE — Progress Notes (Signed)
Peripherally Inserted Central Catheter/Midline Placement  The IV Nurse has discussed with the patient and/or persons authorized to consent for the patient, the purpose of this procedure and the potential benefits and risks involved with this procedure.  The benefits include less needle sticks, lab draws from the catheter and patient may be discharged home with the catheter.  Risks include, but not limited to, infection, bleeding, blood clot (thrombus formation), and puncture of an artery; nerve damage and irregular heat beat.  Alternatives to this procedure were also discussed.  PICC/Midline Placement Documentation        William Bender 10/14/2013, 9:50 AM Phone consent obtained from wife Blythe StanfordCaryn Statzer.

## 2013-10-14 NOTE — Progress Notes (Signed)
NUTRITION FOLLOW UP  Intervention:   - Add multivitamin 47m daily via OGT - Add Prostat 329mdaily via OGT for a total of 5 Prostats per day. Oxepa at 208mr with Prostat 74m7mtimes/day plus calories from Propofol will provide 2067 calories, 105g protein, and 377ml64me water and meet 103% estimated calorie needs, 100% estimated protein needs. If IVF d/c, recommend 240ml 33mr flushes 7 times/day.  - Will continue to monitor   Nutrition Dx:   Inadequate oral intake related to inability to eat as evidenced by NPO - ongoing   Goal:   TF regimen to meet >/= 90% of their estimated nutrition needs - not met with protein but will meet with additional Prostat  Monitor:   Weights, labs, TF tolerance, vent status   Assessment:   47 y/o7ale recently diagnosed with HIV and treated empirically for PCP [facility] with [facility] hypoxemic respiratory failure.   4/5 - Pt intubated 4/5. Per family, pt has lost ~15-20 lbs over the past few weeks. He is using nutritional supplements at home (Boost or Ensure.). Received consult for TF management, RD ordered Vital AF 1.2 @ 20 ml/hr via OG tube and increase by 10 ml every 4 hours to goal rate of 80 ml/hr. At goal rate, tube feeding regimen will provide 2304 kcal, 103 grams of protein, and 1549 ml of H2O. Goal rate meets 100% of estimated calorie and protein needs.   4/7 - Pt started on ARDS protocol 4/6. Per RN, pt tolerating Vital AF 1.2 at goal rate of 80ml/h51mth 74ml of72miduals this morning and 30-40ml res91mls overnight. Remains intubated.   4/10 - Patient remains intubated.  Receiving Oxepa at 20 ml/hr plus prostat and tolerating well. Propofol: 27.5 ml/hr provides 726 calories continues  4/13 - Remains intubated. Receiving Oxepa at 74ml/hr w49mProsat 74ml QID -29mvides 1480 calories, 105g protein. TF plus current calories from Propofol provide 2327 calories meeting 124% estimated calorie needs and 100% estimated protein needs.  Paralytics added 4/10. Needed ET tube changed today which was performed after emergency bronchoscopy. Per RN, pt tolerating TF well without any residuals.   4/17 - Remains intubated. Receiving Oxepa to 20 ml/hr as pt getting high rate of Propfol. Oxepa at 20ml/hr wit34mostat 74ml QID wit35mlories from current Propfol rate will provide 1967 calories, 90g protein, and 377ml free wat67mnd meet 105% estimated calorie needs and 86% estimated protein needs. Per RN, pt tolerating TF with no residuals.   Patient is currently intubated on ventilator support MV: 13.6 L/min Temp (24hrs), Avg:98.6 F (37 C), Min:97.6 F (36.4 C), Max:99.5 F (37.5 C)  Propofol: 32.1 ml/hr - provides 847 calories   Potassium low, getting liquid replacement per OGT AST/ALT elevated  Triglycerides elevated     Height: Ht Readings from Last 1 Encounters:  10/04/13 5' 10"  (1.778 m)    Weight Status:   Wt Readings from Last 1 Encounters:  10/14/13 163 lb 5.8 oz (74.1 kg)  Admit wt:        155 lb (70.3 kg) Net I/Os: +2.4L  Re-estimated needs:  Kcal: 1996 Protein: 105-125g Fluid: >1.8L/day  Skin: +1 Generalized edema, +1 RUE, LUE, LLE, RLE edema, stage 2 sacral pressure ulcer on buttocks   Diet Order: NPO   Intake/Output Summary (Last 24 hours) at 10/14/13 1533 Last data filed at 10/14/13 1500  Gross per 24 hour  Intake 4706.9 ml  Output   5605 ml  Net -  898.1 ml    Last BM: 4/15   Labs:   Recent Labs Lab 10/12/13 0457 10/13/13 0400 10/14/13 0345  NA 128* 132* 132*  K 3.9 3.6* 3.6*  CL 82* 87* 89*  CO2 40* 39* 36*  BUN 25* 26* 21  CREATININE 0.52 0.64 0.56  CALCIUM 8.2* 8.3* 7.6*  MG 2.0 2.0 2.0  PHOS 3.3 4.1 4.1  GLUCOSE 186* 111* 132*    CBG (last 3)   Recent Labs  10/13/13 2335 10/14/13 0311 10/14/13 1237  GLUCAP 161* 136* 163*    Scheduled Meds: . antiseptic oral rinse  15 mL Mouth Rinse QID  . aspirin  81 mg Oral Daily  . azithromycin  1,200 mg Oral Weekly  .  ceFEPime (MAXIPIME) IV  2 g Intravenous Q8H  . chlorhexidine  15 mL Mouth Rinse BID  . docusate  100 mg Oral BID  . dolutegravir  50 mg Oral Daily  . emtricitabine-tenofovir  1 tablet Oral Daily  . enoxaparin (LOVENOX) injection  40 mg Subcutaneous Daily  . feeding supplement (OXEPA)  1,000 mL Per Tube Q24H  . feeding supplement (PRO-STAT SUGAR FREE 64)  30 mL Per Tube QID  . fluconazole  100 mg Oral Weekly  . furosemide  40 mg Intravenous Q12H  . ganciclovir (CYTOVENE) IV  5 mg/kg Intravenous Q12H  . insulin aspart  0-15 Units Subcutaneous 6 times per day  . methylPREDNISolone (SOLU-MEDROL) injection  40 mg Intravenous Q12H  . pantoprazole sodium  40 mg Per Tube Daily  . QUEtiapine  50 mg Per Tube BID  . sodium chloride  10-40 mL Intracatheter Q12H  . sulfamethoxazole-trimethoprim  450 mg Intravenous Q8H  . vancomycin  1,000 mg Intravenous Q8H    Continuous Infusions: . HYDROmorphone 1 mg/hr (10/14/13 1500)  . propofol 70 mcg/kg/min (10/14/13 1500)     Mikey College MS, RD, [place] Pager [phone] After Hours Pager

## 2013-10-14 NOTE — Progress Notes (Signed)
Patient ID: William Bender, male   DOB: Jan 06, 1966, 48 y.o.   MRN: [id number]        [facility]    Date of Admission:  10/06/2013           Day 14 trimethoprim-sulfamethoxazole steroids        Day 12 ganciclovir        Day cefepime (second course) Principal Problem:   Pneumonia Active Problems:   Acute respiratory failure   AIDS   PCP (pneumocystis carinii pneumonia)   CMV (cytomegalovirus)   Sacral decubitus ulcer, stage II   Hyponatremia   Pneumomediastinum   . antiseptic oral rinse  15 mL Mouth Rinse QID  . aspirin  81 mg Oral Daily  . azithromycin  1,200 mg Oral Weekly  . ceFEPime (MAXIPIME) IV  2 g Intravenous Q8H  . chlorhexidine  15 mL Mouth Rinse BID  . docusate  100 mg Oral BID  . dolutegravir  50 mg Oral Daily  . emtricitabine-tenofovir  1 tablet Oral Daily  . enoxaparin (LOVENOX) injection  40 mg Subcutaneous Daily  . feeding supplement (OXEPA)  1,000 mL Per Tube Q24H  . feeding supplement (PRO-STAT SUGAR FREE 64)  30 mL Per Tube QID  . fluconazole  100 mg Oral Weekly  . furosemide  40 mg Intravenous Q12H  . ganciclovir (CYTOVENE) IV  5 mg/kg Intravenous Q12H  . insulin aspart  0-15 Units Subcutaneous 6 times per day  . methylPREDNISolone (SOLU-MEDROL) injection  40 mg Intravenous Q12H  . pantoprazole sodium  40 mg Per Tube Daily  . QUEtiapine  50 mg Per Tube BID  . sodium chloride  10-40 mL Intracatheter Q12H  . sulfamethoxazole-trimethoprim  450 mg Intravenous Q8H  . vancomycin  1,000 mg Intravenous Q8H   Objective: Temp:  [97.6 F (36.4 C)-99.5 F (37.5 C)] 99.5 F (37.5 C) (04/17 1500) Pulse Rate:  [84-124] 111 (04/17 1500) Resp:  [12-32] 26 (04/17 1500) BP: (103-152)/(49-76) 148/72 mmHg (04/17 1500) SpO2:  [89 %-99 %] 92 % (04/17 1500) FiO2 (%):  [40 %-70 %] 40 % (04/17 1210) Weight:  [74.1 kg (163 lb 5.8 oz)] 74.1 kg (163 lb 5.8 oz) (04/17 0023)  General: Although on large doses of sedatives he is more alert. His nurse  reports that he was appropriately interactive this morning and able to answer yes no questions by nodding appropriately He has tolerated some weaning of the ventilator. His FiO2 is down to .40 with PEEP at 8.  Lab Results Lab Results  Component Value Date   WBC 8.0 10/14/2013   HGB 6.8* 10/14/2013   HCT 21.0* 10/14/2013   MCV 92.9 10/14/2013   PLT 207 10/14/2013    Lab Results  Component Value Date   CREATININE 0.56 10/14/2013   BUN 21 10/14/2013   NA 132* 10/14/2013   K 3.6* 10/14/2013   CL 89* 10/14/2013   CO2 36* 10/14/2013    Lab Results  Component Value Date   ALT 62* 10/13/2013   AST 42* 10/13/2013   ALKPHOS 64 10/13/2013   BILITOT <0.2* 10/13/2013      Microbiology: Recent Results (from the past 240 hour(s))  CULTURE, BLOOD (ROUTINE X 2)     Status: None   Collection Time    10/08/13 12:29 PM      Result Value Ref Range Status   Specimen Description BLOOD RIGHT ARM  2.5 ML IN [facility] BOTTLE   Final   Special Requests NONE   Final  Culture  Setup Time     Final   Value: 10/08/2013 17:24     Performed at [facility]   Culture     Final   Value: NO GROWTH 5 DAYS     Performed at [facility]   Report Status 10/14/2013 FINAL   Final  AFB CULTURE, BLOOD     Status: None   Collection Time    10/08/13 12:39 PM      Result Value Ref Range Status   Specimen Description BLOOD LEFT ARM  5 ML IN [facility] BOTTLE   Final   Special Requests NONE   Final   Culture     Final   Value: CULTURE WILL BE EXAMINED FOR 6 WEEKS BEFORE ISSUING A FINAL REPORT     Performed at [facility]   Report Status PENDING   Incomplete  CULTURE, BLOOD (ROUTINE X 2)     Status: None   Collection Time    10/08/13 12:39 PM      Result Value Ref Range Status   Specimen Description BLOOD LEFT ARM  5 ML IN [facility] BOTTLE   Final   Special Requests NONE   Final   Culture  Setup Time     Final   Value: 10/08/2013 17:24     Performed at [facility]   Culture     Final   Value: NO  GROWTH 5 DAYS     Performed at [facility]   Report Status 10/14/2013 FINAL   Final  RESPIRATORY VIRUS PANEL     Status: Abnormal   Collection Time    10/08/13  4:41 PM      Result Value Ref Range Status   Source - [facility] NASAL SWAB   Corrected   Comment: CORRECTED ON 04/13 AT 2130: PREVIOUSLY REPORTED AS NASAL SWAB   Respiratory Syncytial Virus A NOT DETECTED   Final   Respiratory Syncytial Virus B NOT DETECTED   Final   Influenza A NOT DETECTED   Final   Influenza B NOT DETECTED   Final   Parainfluenza 1 NOT DETECTED   Final   Parainfluenza 2 DETECTED (*)  Final   Parainfluenza 3 NOT DETECTED   Final   Metapneumovirus NOT DETECTED   Final   Rhinovirus NOT DETECTED   Final   Adenovirus NOT DETECTED   Final   Influenza A H1 NOT DETECTED   Final   Influenza A H3 NOT DETECTED   Final   Comment: (NOTE)           Normal Reference Range for each Analyte: NOT DETECTED     Testing performed using the Luminex xTAG Respiratory Viral Panel test     kit.     This test was developed and its performance characteristics determined     by [facility]. It has not been cleared or approved by the [place]     Food and Drug Administration. This test is used for clinical purposes.     It should not be regarded as investigational or for research. This     laboratory is certified under the [place] (CLIA) as qualified to perform high complexity     clinical laboratory testing.     Performed at [facility]     Status: None   Collection Time    10/10/13 10:45 AM      Result Value Ref  Range Status   Specimen Description BRONCHIAL ALVEOLAR LAVAGE   Final   Special Requests Immunocompromised   Final   Fungal Smear     Final   Value: NO YEAST OR FUNGAL ELEMENTS SEEN     Performed at [facility]   Report Status 10/11/2013 FINAL   Final  M. TUBERCULOSIS COMPLEX BY PCR     Status: None   Collection Time    10/10/13  10:45 AM      Result Value Ref Range Status   M. tuberculosis, Direct Not detected  Not detected Final   Comment: (NOTE)     This test(s) was developed and its performance characteristics      have been determined by [employer],      [place], [place]. Performance characteristics refer to the analytical      performance of the test.     Performed at [place] ([facility]) BRONCHIAL ALVEOLAR LAVAGE   Final  FUNGUS CULTURE W SMEAR     Status: None   Collection Time    10/10/13 10:52 AM      Result Value Ref Range Status   Specimen Description BRONCHIAL ALVEOLAR LAVAGE   Final   Special Requests Immunocompromised   Final   Fungal Smear     Final   Value: NO YEAST OR FUNGAL ELEMENTS SEEN     Performed at [facility]   Culture     Final   Value: CULTURE IN PROGRESS FOR FOUR WEEKS     Performed at [facility]   Report Status PENDING   Incomplete  CULTURE, BAL-QUANTITATIVE     Status: None   Collection Time    10/10/13 10:54 AM      Result Value Ref Range Status   Specimen Description BRONCHIAL ALVEOLAR LAVAGE   Final   Special Requests NONE   Final   Gram Stain     Final   Value: RARE WBC PRESENT,BOTH PMN AND MONONUCLEAR     NO SQUAMOUS EPITHELIAL CELLS SEEN     NO ORGANISMS SEEN     Performed at [facility]     Final   Value: 100 COLONIES/ML     Performed at [facility]   Culture     Final   Value: Non-Pathogenic Oropharyngeal-type Flora Isolated.     Performed at [facility]   Report Status 10/12/2013 FINAL   Final  CULTURE, BLOOD (ROUTINE X 2)     Status: None   Collection Time    10/13/13  9:00 AM      Result Value Ref Range Status   Specimen Description BLOOD RIGHT ARM   Final   Special Requests BOTTLES DRAWN AEROBIC AND ANAEROBIC 8CC   Final   Culture  Setup Time     Final   Value: 10/13/2013 10:06     Performed at [facility]   Culture     Final   Value:         BLOOD CULTURE RECEIVED NO GROWTH TO DATE CULTURE WILL BE HELD FOR 5 DAYS BEFORE ISSUING A FINAL NEGATIVE REPORT     Performed at [facility]   Report Status PENDING   Incomplete  CULTURE, BLOOD (ROUTINE X 2)     Status: None   Collection Time    10/13/13  9:10 AM      Result Value Ref Range Status   Specimen Description  BLOOD LEFT ARM   Final   Special Requests BOTTLES DRAWN AEROBIC AND ANAEROBIC [facility]   Final   Culture  Setup Time     Final   Value: 10/13/2013 10:06     Performed at [facility]   Culture     Final   Value:        BLOOD CULTURE RECEIVED NO GROWTH TO DATE CULTURE WILL BE HELD FOR 5 DAYS BEFORE ISSUING A FINAL NEGATIVE REPORT     Performed at [facility]   Report Status PENDING   Incomplete  URINE CULTURE     Status: None   Collection Time    10/13/13 12:27 PM      Result Value Ref Range Status   Specimen Description URINE, CATHETERIZED   Final   Special Requests NONE   Final   Culture  Setup Time     Final   Value: 10/13/2013 14:45     Performed at [facility]     Final   Value: 5,000 COLONIES/ML     Performed at [facility]   Culture     Final   Value: INSIGNIFICANT GROWTH     Performed at [facility]   Report Status 10/14/2013 FINAL   Final  CULTURE, RESPIRATORY (NON-EXPECTORATED)     Status: None   Collection Time    10/13/13  4:04 PM      Result Value Ref Range Status   Specimen Description ENDOTRACHEAL   Final   Special Requests NONE   Final   Gram Stain     Final   Value: ABUNDANT WBC PRESENT,BOTH PMN AND MONONUCLEAR     NO SQUAMOUS EPITHELIAL CELLS SEEN     NO ORGANISMS SEEN     Performed at [facility]   Culture     Final   Value: NO GROWTH     Performed at [facility]   Report Status PENDING   Incomplete    Studies/Results: Dg Chest Port 1 View  10/14/2013   CLINICAL DATA:  Pneumonia  EXAM: PORTABLE CHEST - 1 VIEW  COMPARISON:  DG CHEST 1V PORT dated  10/13/2013  FINDINGS: Endotracheal tube is appropriately positioned. Nasogastric tube tip terminates below the level of the hemidiaphragms but is not included in the field of view. Confluent left greater than right diffuse airspace opacity pattern with air bronchogram formation again noted. No pneumothorax or pleural effusion. Right IJ central line tip terminates over the mid SVC. No acute osseous finding. Left glenohumeral joint degenerative change reidentified.  IMPRESSION: No significant change in diffuse bilateral airspace opacity pattern that could represent pneumonia or other alveolar filling process, although ARDS could have a similar appearance.   Electronically Signed   By: Conchita Paris M.D.   On: 10/14/2013 07:14   Dg Chest Port 1 View  10/13/2013   CLINICAL DATA:  Evaluate endotracheal tube placement.  EXAM: PORTABLE CHEST - 1 VIEW  COMPARISON:  DG CHEST 1V PORT dated 10/12/2013  FINDINGS: Endotracheal tube is 2.5 cm above the carina. There are persistent airspace densities throughout both lungs with mild sparing in the right upper lung. Heart size is within normal limits and stable. A nasogastric tube extends into the abdomen. Right jugular central line tip in the upper SVC region. Negative for a pneumothorax.  IMPRESSION: Support apparatuses as described.  No change in the bilateral airspace disease.   Electronically Signed   By: Markus Daft  M.D.   On: 10/13/2013 07:51    Assessment: He has not had any further fever since yesterday morning and it is showing some improvement with decreased support from the ventilator. Pathogens have been identified. I will discontinue cefepime tomorrow if cultures remain negative and continue trimethoprim sulfamethoxazole and ganciclovir.  Plan: 1. Continue trimethoprim-sulfamethoxazole and ganciclovir  2. Consider stopping cefepime tomorrow 3. I will follow up this weekend  Michel Bickers, MD [facility] for [place] [phone] pager   [phone] cell 10/14/2013, 3:17 PM

## 2013-10-14 NOTE — Progress Notes (Signed)
Patient agitated, attempting to pull at tube, HR 130s, RR >30, O2 sats 84-87%.  FiO2 increased to 50%, Versed 2mg  IV given with no effect.  ELink MD notified, order to increase max limit on Propofol gtt received.  Patient flushed, T100.8.  Will continue to monitor.

## 2013-10-14 NOTE — Progress Notes (Signed)
Gancyclovir disposed of in yellow chemo bucket.  All bags and sets put in yellow bucket using chemo protocol.

## 2013-10-15 ENCOUNTER — Inpatient Hospital Stay (HOSPITAL_COMMUNITY): Payer: BC Managed Care – PPO

## 2013-10-15 DIAGNOSIS — B49 Unspecified mycosis: Secondary | ICD-10-CM

## 2013-10-15 DIAGNOSIS — I517 Cardiomegaly: Secondary | ICD-10-CM

## 2013-10-15 DIAGNOSIS — R509 Fever, unspecified: Secondary | ICD-10-CM

## 2013-10-15 LAB — BASIC METABOLIC PANEL
BUN: 19 mg/dL (ref 6–23)
CO2: 34 mEq/L — ABNORMAL HIGH (ref 19–32)
CREATININE: 0.51 mg/dL (ref 0.50–1.35)
Calcium: 7.9 mg/dL — ABNORMAL LOW (ref 8.4–10.5)
Chloride: 92 mEq/L — ABNORMAL LOW (ref 96–112)
Glucose, Bld: 137 mg/dL — ABNORMAL HIGH (ref 70–99)
Potassium: 3.8 mEq/L (ref 3.7–5.3)
Sodium: 134 mEq/L — ABNORMAL LOW (ref 137–147)

## 2013-10-15 LAB — CBC
HEMATOCRIT: 23.5 % — AB (ref 39.0–52.0)
Hemoglobin: 7.8 g/dL — ABNORMAL LOW (ref 13.0–17.0)
MCH: 29.7 pg (ref 26.0–34.0)
MCHC: 33.2 g/dL (ref 30.0–36.0)
MCV: 89.4 fL (ref 78.0–100.0)
Platelets: 237 10*3/uL (ref 150–400)
RBC: 2.63 MIL/uL — ABNORMAL LOW (ref 4.22–5.81)
RDW: 16.6 % — AB (ref 11.5–15.5)
WBC: 9.8 10*3/uL (ref 4.0–10.5)

## 2013-10-15 LAB — GLUCOSE, CAPILLARY
GLUCOSE-CAPILLARY: 143 mg/dL — AB (ref 70–99)
Glucose-Capillary: 127 mg/dL — ABNORMAL HIGH (ref 70–99)
Glucose-Capillary: 148 mg/dL — ABNORMAL HIGH (ref 70–99)
Glucose-Capillary: 118 mg/dL — ABNORMAL HIGH (ref 70–99)
Glucose-Capillary: 116 mg/dL — ABNORMAL HIGH (ref 70–99)

## 2013-10-15 LAB — VANCOMYCIN, TROUGH: Vancomycin Tr: 12.7 ug/mL (ref 10.0–20.0)

## 2013-10-15 LAB — TRIGLYCERIDES: Triglycerides: 243 mg/dL — ABNORMAL HIGH (ref <150)

## 2013-10-15 MED ORDER — FUROSEMIDE 8 MG/ML PO SOLN
40.0000 mg | Freq: Two times a day (BID) | ORAL | Status: DC
  Administered 2013-10-15 – 2013-10-16 (×3): 40 mg
  Filled 2013-10-15 (×6): qty 5

## 2013-10-15 MED ORDER — SODIUM CHLORIDE 0.9 % IV SOLN
150.0000 mg | Freq: Every day | INTRAVENOUS | Status: DC
  Administered 2013-10-15 – 2013-10-20 (×7): 150 mg via INTRAVENOUS
  Filled 2013-10-15 (×9): qty 150

## 2013-10-15 MED ORDER — METHYLPREDNISOLONE SODIUM SUCC 40 MG IJ SOLR
20.0000 mg | Freq: Two times a day (BID) | INTRAMUSCULAR | Status: DC
  Administered 2013-10-15 – 2013-10-19 (×8): 20 mg via INTRAVENOUS
  Filled 2013-10-15 (×3): qty 0.5
  Filled 2013-10-15: qty 1
  Filled 2013-10-15 (×5): qty 0.5

## 2013-10-15 NOTE — Progress Notes (Signed)
PULMONARY / CRITICAL CARE MEDICINE   Name: William Bender MRN: [id number] DOB: 08/29/1965    ADMISSION DATE:  10/19/2013 CONSULTATION DATE:  10/04/2013  REFERRING MD :  Rancour  CHIEF COMPLAINT:  Shortness of breath, fever, cough  BRIEF PATIENT DESCRIPTION: 48 y/o male recently diagnosed with HIV/AIDS -CD4 =30 on 3/31 and treated empirically for PCP [facility] with acute hypoxemic respiratory failure. Note that sputum PCP was neg 3/18 but was on bactrim prior  SIGNIFICANT EVENTS: 4/09 Pneumomediastinum, nimbex added 4/10 desat, lasix started, peep improved to 10   4/13 ETT had migrated, re-advanced and verified w/ FOB. Obtained BAL at this time  4/16 Tm 101F 4/17 1 unit PRBC for Hb 6.8 4/18 Yeast in blood >> added micafungin  STUDIES:  4/4 CT angio > no PE, diffuse bilateral GGO and patchy consolidation worse in the bases L > R 4/9 CT chest > pneumomediastinum with subcutaneous emphysema, severe diffuse GGO b/l 4/15 RUE US>>> NEGATIVE  LINES / TUBES: 4/5 ETT >>4/13 (exchanged)>>> 4/5 R IJ CVL >> 4/17 4/17 Lt PICC >>   CULTURES (truncated 4/18): 4/4 CMV pcr >> POSITIVE 4/5 fungal  bal >> 4/5 afb  bal >> 4/11 resp virus panel: + parainfluenza 2 4/11 AFB blood cx >>> 4/13 fungal BAL >>> 4/13 CMV PCR >>> 101,862 copies/ml 4/13 fungitell >>> 4/16 Blood >>> Yeast in one of two >>> 4/16 Sputum >>> 4/16 Urine >>> negative  ANTIBIOTICS: 4/4 Bactrim >>> 4/4 Vanc >>> 4/4 4/4 Cefepime >>> 4/14 4/6 Gancyclovir >>> 4/16 Vanc >>> 4/16 cefepime >>>   SUBJECTIVE:  Respiratory mechanics improved.  Tolerated some pressure support.  PEEP down to 8.  Had recurrent fever last night.  VITAL SIGNS: Temp:  [97.8 F (36.6 C)-101.4 F (38.6 C)] 97.8 F (36.6 C) (04/18 0421) Pulse Rate:  [70-132] 94 (04/18 0800) Resp:  [16-32] 17 (04/18 0800) BP: (103-148)/(49-72) 126/66 mmHg (04/18 0800) SpO2:  [85 %-98 %] 90 % (04/18 0800) FiO2 (%):  [40 %-60 %] 50 % (04/18  0749) Weight:  [161 lb 13.1 oz (73.4 kg)] 161 lb 13.1 oz (73.4 kg) (04/18 0400) VENTILATOR SETTINGS: Vent Mode:  [-] PRVC FiO2 (%):  [40 %-60 %] 50 % Set Rate:  [26 bmp] 26 bmp Vt Set:  [440 mL] 440 mL PEEP:  [8 cmH20] 8 cmH20 Plateau Pressure:  [15 cmH20-23 cmH20] 17 cmH20 INTAKE / OUTPUT: Intake/Output     04/17 0701 - 04/18 0700 04/18 0701 - 04/19 0700   I.V. (mL/kg) 1765.1 (24)    Blood 335    NG/GT     IV Piggyback 2534.3    Total Intake(mL/kg) 4634.4 (63.1)    Urine (mL/kg/hr) 3250 (1.8) 1400 (10.4)   Total Output 3250 1400   Net +1384.4 -1400          PHYSICAL EXAMINATION: Gen: no distress HEENT: ETT in place PULM: scattered rhonchi, no wheeze CV: regular, tachycardic AB: soft, non tender Neuro: RASS -2 Ext: 1+ edema Derm: sacral decub noted   LABS: PULMONARY  Recent Labs Lab 10/08/13 1050 10/09/13 0355 10/10/13 0419 10/11/13 0400  PHART 7.415 7.379 7.441 7.460*  PCO2ART 60.2* 70.6* 66.1* 59.6*  PO2ART 67.0* 89.5 88.2 87.0  HCO3 37.9* 40.7* 44.3* 42.0*  TCO2 34.3 38.4 41.0 39.5  O2SAT 89.3 94.2 95.9 94.8    CBC  Recent Labs Lab 10/13/13 0400 10/14/13 0345 10/15/13 0531  HGB 7.6* 6.8* 7.8*  HCT 23.1* 21.0* 23.5*  WBC 9.8 8.0 9.8  PLT 230 207 237    COAGULATION  Recent Labs Lab 10/09/13 1045  INR 0.98    CHEMISTRY  Recent Labs Lab 10/11/13 0414 10/12/13 0457 10/13/13 0400 10/14/13 0345 10/15/13 0531  NA 131* 128* 132* 132* 134*  K 3.8 3.9 3.6* 3.6* 3.8  CL 81* 82* 87* 89* 92*  CO2 43* 40* 39* 36* 34*  GLUCOSE 147* 186* 111* 132* 137*  BUN 23 25* 26* 21 19  CREATININE 0.61 0.52 0.64 0.56 0.51  CALCIUM 7.9* 8.2* 8.3* 7.6* 7.9*  MG 2.0 2.0 2.0 2.0  --   PHOS 3.4 3.3 4.1 4.1  --    Estimated Creatinine Clearance: 117.9 ml/min (by C-G formula based on Cr of 0.51).   LIVER  Recent Labs Lab 10/09/13 0428 10/09/13 1045 10/10/13 0605 10/11/13 0414 10/12/13 0457 10/13/13 0400  AST 37  --  40* 43* 52* 42*  ALT 40  --   40 46 53 62*  ALKPHOS 59  --  57 65 64 64  BILITOT <0.2*  --  <0.2* <0.2* <0.2* <0.2*  PROT 5.2*  --  4.9* 5.1* 5.0* 4.9*  ALBUMIN 1.6*  --  1.6* 1.7* 1.7* 1.7*  INR  --  0.98  --   --   --   --     INFECTIOUS  Recent Labs Lab 10/11/13 1100  LATICACIDVEN 2.4*    ENDOCRINE CBG (last 3)   Recent Labs  10/14/13 1947 10/14/13 2307 10/15/13 0358  GLUCAP 143* 167* 143*     IMAGING x48h  Dg Chest Port 1 View  10/15/2013   CLINICAL DATA:  Follow-up ARDS  EXAM: PORTABLE CHEST - 1 VIEW  COMPARISON:  Prior chest x-ray 10/14/2013  FINDINGS: The endotracheal tube is 8 mm above the carina. Stable position of right IJ approach central venous catheter. Tip projects over the upper SVC. And a nasogastric tube is incompletely imaged but can be identified below the diaphragm within the stomach. Essentially stable appearance of a bilateral interstitial and airspace opacities. A left upper extremity PICC is present. The tip overlies the superior cavoatrial junction. No large effusion. No acute osseous abnormality.  IMPRESSION: 1. No significant interval change in the appearance of bilateral interstitial and airspace opacities consistent with the clinical diagnosis of ARDS. 2. The endotracheal tube is just above (8 mm) the carina. Consider withdrawing 2 cm for more optimal positioning. Otherwise, support apparatus in stable and satisfactory position.   Electronically Signed   By: Malachy Moan M.D.   On: 10/15/2013 08:29   Dg Chest Port 1 View  10/14/2013   CLINICAL DATA:  Pneumonia  EXAM: PORTABLE CHEST - 1 VIEW  COMPARISON:  DG CHEST 1V PORT dated 10/13/2013  FINDINGS: Endotracheal tube is appropriately positioned. Nasogastric tube tip terminates below the level of the hemidiaphragms but is not included in the field of view. Confluent left greater than right diffuse airspace opacity pattern with air bronchogram formation again noted. No pneumothorax or pleural effusion. Right IJ central line tip  terminates over the mid SVC. No acute osseous finding. Left glenohumeral joint degenerative change reidentified.  IMPRESSION: No significant change in diffuse bilateral airspace opacity pattern that could represent pneumonia or other alveolar filling process, although ARDS could have a similar appearance.   Electronically Signed   By: Christiana Pellant M.D.   On: 10/14/2013 07:14    ASSESSMENT / PLAN:  PULMONARY A:  Acute respiratory failure with ARDS 2nd to Parainfluenza PNA and presumed PCP  in setting of  HIV. Pneumomediastinum developed 4/09 >> resolved. ETT exchanged 4/13 d/t leak. Increased Oxygen needs 4/16 >> better 4/17. P:   F/u CXR Adjust PEEP/FiO2 to keep SpO2 > 88% Change solumedrol 20 mg IV q12 h on 4/18 >> using in setting of presumed PCP and immune reconstitution  Will need trach at some point >> likely week of 4/20  INFECTIOUS A:   PNA in setting of HIV >> viral pneumonitis (+ parainfluenza 2) +/-bacterial, PCP. Stage II buttock ulcers. CMV viremia. His parainfluenza 2 was positive >> clinically Significant with ARDS. Fever 101F on 4/16 >> Yeast in blood cx from 4/16. P:   Added cefepime, vancomycin 4/16 >> defer to ID whether these need to be continued Continue micafungin TTE to assess for vegetation in setting of fungemia Wound care consult to assess sacral decub  CARDIOVASCULAR A:  Severe sepsis. Hx of HTN. 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 Hypervolemia. Mild contraction alkalosis. P:   Continue diuresis as tolerate with goal negative fluid balance Monitor renal fx, urine outpt, electrolytes  GASTROINTESTINAL A:   Protein calorie malnutrition. P:   Protonix for SUP Tube feeds to goal  HEMATOLOGIC A:   Anemia of critical illness and chronic disease >> no evidence for bleeding. RUE swelling >> doppler negative from 4/15. P:  F/u CBC after transfusion 4/17 Continue LMWH  for DVT prevention  ENDOCRINE A:   Steroid induced hyperglycemia. P:   SSI  NEUROLOGIC A:    Anxiety, pain control, sedation >> changed to dilaudid 4/14. P:   Goal RASS -2 >> reduce sedation after trach  CC time 35 minutes.  Coralyn HellingVineet Anmol Paschen, MD [facility] 10/15/2013, 8:51 AM Pager:  [phone] After 3pm call: [phone]

## 2013-10-15 NOTE — Progress Notes (Signed)
Patient ID: William Bender, male   DOB: 28-Sep-1965, 48 y.o.   MRN: [id number]        [facility]    Date of Admission:  10/04/2013           Day 15 trimethoprim-sulfamethoxazole steroids        Day 13 ganciclovir        Day 3 cefepime (second course)        Day 1 vancomycin        Day 1 micafungin Principal Problem:   Pneumonia Active Problems:   Acute respiratory failure   AIDS   PCP (pneumocystis carinii pneumonia)   CMV (cytomegalovirus)   Sacral decubitus ulcer, stage II   Hyponatremia   Pneumomediastinum   Fungemia   . antiseptic oral rinse  15 mL Mouth Rinse QID  . aspirin  81 mg Oral Daily  . azithromycin  1,200 mg Oral Weekly  . ceFEPime (MAXIPIME) IV  2 g Intravenous Q8H  . chlorhexidine  15 mL Mouth Rinse BID  . docusate  100 mg Oral BID  . dolutegravir  50 mg Oral Daily  . emtricitabine-tenofovir  1 tablet Oral Daily  . enoxaparin (LOVENOX) injection  40 mg Subcutaneous Daily  . feeding supplement (OXEPA)  1,000 mL Per Tube Q24H  . feeding supplement (PRO-STAT SUGAR FREE 64)  30 mL Per Tube 5 X Daily  . fluconazole  100 mg Oral Weekly  . furosemide  40 mg Per Tube Q12H  . ganciclovir (CYTOVENE) IV  5 mg/kg Intravenous Q12H  . insulin aspart  0-15 Units Subcutaneous 6 times per day  . methylPREDNISolone (SOLU-MEDROL) injection  20 mg Intravenous Q12H  . micafungin (MYCAMINE) IV  150 mg Intravenous QHS  . multivitamin  5 mL Per Tube Daily  . pantoprazole sodium  40 mg Per Tube Daily  . QUEtiapine  50 mg Per Tube BID  . sodium chloride  10-40 mL Intracatheter Q12H  . sulfamethoxazole-trimethoprim  450 mg Intravenous Q8H  . vancomycin  1,000 mg Intravenous Q8H   Objective: Temp:  [97.8 F (36.6 C)-101.4 F (38.6 C)] 99 F (37.2 C) (04/18 0800) Pulse Rate:  [70-132] 81 (04/18 1000) Resp:  [16-32] 25 (04/18 1000) BP: (103-148)/(49-72) 104/49 mmHg (04/18 1000) SpO2:  [85 %-98 %] 94 % (04/18 1000) FiO2 (%):  [40 %-60 %] 50 % (04/18  0749) Weight:  [73.4 kg (161 lb 13.1 oz)] 73.4 kg (161 lb 13.1 oz) (04/18 0400)  General: His nurse reports that he was appropriately interactive this morning and able to answer yes no questions by nodding appropriately He has tolerated some weaning of the ventilator. His FiO2 is down to .40.  Lab Results Lab Results  Component Value Date   WBC 9.8 10/15/2013   HGB 7.8* 10/15/2013   HCT 23.5* 10/15/2013   MCV 89.4 10/15/2013   PLT 237 10/15/2013    Lab Results  Component Value Date   CREATININE 0.51 10/15/2013   BUN 19 10/15/2013   NA 134* 10/15/2013   K 3.8 10/15/2013   CL 92* 10/15/2013   CO2 34* 10/15/2013    Lab Results  Component Value Date   ALT 62* 10/13/2013   AST 42* 10/13/2013   ALKPHOS 64 10/13/2013   BILITOT <0.2* 10/13/2013      Microbiology: Recent Results (from the past 240 hour(s))  CULTURE, BLOOD (ROUTINE X 2)     Status: None   Collection Time    10/08/13 12:29 PM  Result Value Ref Range Status   Specimen Description BLOOD RIGHT ARM  2.5 ML IN [facility] BOTTLE   Final   Special Requests NONE   Final   Culture  Setup Time     Final   Value: 10/08/2013 17:24     Performed at [facility]   Culture     Final   Value: NO GROWTH 5 DAYS     Performed at [facility]   Report Status 10/14/2013 FINAL   Final  AFB CULTURE, BLOOD     Status: None   Collection Time    10/08/13 12:39 PM      Result Value Ref Range Status   Specimen Description BLOOD LEFT ARM  5 ML IN [facility] BOTTLE   Final   Special Requests NONE   Final   Culture     Final   Value: CULTURE WILL BE EXAMINED FOR 6 WEEKS BEFORE ISSUING A FINAL REPORT     Performed at [facility]   Report Status PENDING   Incomplete  CULTURE, BLOOD (ROUTINE X 2)     Status: None   Collection Time    10/08/13 12:39 PM      Result Value Ref Range Status   Specimen Description BLOOD LEFT ARM  5 ML IN [facility] BOTTLE   Final   Special Requests NONE   Final   Culture  Setup Time     Final   Value:  10/08/2013 17:24     Performed at [facility]   Culture     Final   Value: NO GROWTH 5 DAYS     Performed at [facility]   Report Status 10/14/2013 FINAL   Final  RESPIRATORY VIRUS PANEL     Status: Abnormal   Collection Time    10/08/13  4:41 PM      Result Value Ref Range Status   Source - [facility] NASAL SWAB   Corrected   Comment: CORRECTED ON 04/13 AT 2130: PREVIOUSLY REPORTED AS NASAL SWAB   Respiratory Syncytial Virus A NOT DETECTED   Final   Respiratory Syncytial Virus B NOT DETECTED   Final   Influenza A NOT DETECTED   Final   Influenza B NOT DETECTED   Final   Parainfluenza 1 NOT DETECTED   Final   Parainfluenza 2 DETECTED (*)  Final   Parainfluenza 3 NOT DETECTED   Final   Metapneumovirus NOT DETECTED   Final   Rhinovirus NOT DETECTED   Final   Adenovirus NOT DETECTED   Final   Influenza A H1 NOT DETECTED   Final   Influenza A H3 NOT DETECTED   Final   Comment: (NOTE)           Normal Reference Range for each Analyte: NOT DETECTED     Testing performed using the Luminex xTAG Respiratory Viral Panel test     kit.     This test was developed and its performance characteristics determined     by [facility]. It has not been cleared or approved by the [place]     Food and Drug Administration. This test is used for clinical purposes.     It should not be regarded as investigational or for research. This     laboratory is certified under the [place] (CLIA) as qualified to perform high complexity     clinical laboratory testing.     Performed  at [facility]     Status: None   Collection Time    10/10/13 10:45 AM      Result Value Ref Range Status   Specimen Description BRONCHIAL ALVEOLAR LAVAGE   Final   Special Requests Immunocompromised   Final   Fungal Smear     Final   Value: NO YEAST OR FUNGAL ELEMENTS SEEN     Performed at [facility]   Report Status 10/11/2013 FINAL    Final  M. TUBERCULOSIS COMPLEX BY PCR     Status: None   Collection Time    10/10/13 10:45 AM      Result Value Ref Range Status   M. tuberculosis, Direct Not detected  Not detected Final   Comment: (NOTE)     This test(s) was developed and its performance characteristics      have been determined by [facility],      [place], [place]. Performance characteristics refer to the analytical      performance of the test.     Performed at [facility] ([facility]) BRONCHIAL ALVEOLAR LAVAGE   Final  FUNGUS CULTURE W SMEAR     Status: None   Collection Time    10/10/13 10:52 AM      Result Value Ref Range Status   Specimen Description BRONCHIAL ALVEOLAR LAVAGE   Final   Special Requests Immunocompromised   Final   Fungal Smear     Final   Value: NO YEAST OR FUNGAL ELEMENTS SEEN     Performed at [facility]   Culture     Final   Value: CULTURE IN PROGRESS FOR FOUR WEEKS     Performed at [facility]   Report Status PENDING   Incomplete  CULTURE, BAL-QUANTITATIVE     Status: None   Collection Time    10/10/13 10:54 AM      Result Value Ref Range Status   Specimen Description BRONCHIAL ALVEOLAR LAVAGE   Final   Special Requests NONE   Final   Gram Stain     Final   Value: RARE WBC PRESENT,BOTH PMN AND MONONUCLEAR     NO SQUAMOUS EPITHELIAL CELLS SEEN     NO ORGANISMS SEEN     Performed at [facility]     Final   Value: 100 COLONIES/ML     Performed at [facility]   Culture     Final   Value: Non-Pathogenic Oropharyngeal-type Flora Isolated.     Performed at [facility]   Report Status 10/12/2013 FINAL   Final  CULTURE, BLOOD (ROUTINE X 2)     Status: None   Collection Time    10/13/13  9:00 AM      Result Value Ref Range Status   Specimen Description BLOOD RIGHT ARM   Final   Special Requests BOTTLES DRAWN AEROBIC AND ANAEROBIC 8CC   Final   Culture  Setup Time     Final   Value:  10/13/2013 10:06     Performed at [facility]   Culture     Final   Value:        BLOOD CULTURE RECEIVED NO GROWTH TO DATE CULTURE WILL BE HELD FOR 5 DAYS BEFORE ISSUING A FINAL NEGATIVE REPORT     Performed at [facility]   Report Status PENDING   Incomplete  CULTURE, BLOOD (ROUTINE X 2)  Status: None   Collection Time    10/13/13  9:10 AM      Result Value Ref Range Status   Specimen Description BLOOD LEFT ARM   Final   Special Requests BOTTLES DRAWN AEROBIC AND ANAEROBIC [facility]   Final   Culture  Setup Time     Final   Value: 10/13/2013 10:06     Performed at [facility]   Culture     Final   Value: YEAST     Note: Gram Stain Report Called to,Read Back By and Verified With: TZEZT WOODS 10/15/13 1232A [place]     Performed at [facility]   Report Status PENDING   Incomplete  URINE CULTURE     Status: None   Collection Time    10/13/13 12:27 PM      Result Value Ref Range Status   Specimen Description URINE, CATHETERIZED   Final   Special Requests NONE   Final   Culture  Setup Time     Final   Value: 10/13/2013 14:45     Performed at [facility]     Final   Value: 5,000 COLONIES/ML     Performed at [facility]   Culture     Final   Value: INSIGNIFICANT GROWTH     Performed at [facility]   Report Status 10/14/2013 FINAL   Final  CULTURE, RESPIRATORY (NON-EXPECTORATED)     Status: None   Collection Time    10/13/13  4:04 PM      Result Value Ref Range Status   Specimen Description ENDOTRACHEAL   Final   Special Requests NONE   Final   Gram Stain     Final   Value: ABUNDANT WBC PRESENT,BOTH PMN AND MONONUCLEAR     NO SQUAMOUS EPITHELIAL CELLS SEEN     NO ORGANISMS SEEN     Performed at [facility]   Culture     Final   Value: NO GROWTH     Performed at [facility]   Report Status PENDING   Incomplete    Studies/Results: Dg Chest Port 1 View  10/15/2013   CLINICAL DATA:   Follow-up ARDS  EXAM: PORTABLE CHEST - 1 VIEW  COMPARISON:  Prior chest x-ray 10/14/2013  FINDINGS: The endotracheal tube is 8 mm above the carina. Stable position of right IJ approach central venous catheter. Tip projects over the upper SVC. And a nasogastric tube is incompletely imaged but can be identified below the diaphragm within the stomach. Essentially stable appearance of a bilateral interstitial and airspace opacities. A left upper extremity PICC is present. The tip overlies the superior cavoatrial junction. No large effusion. No acute osseous abnormality.  IMPRESSION: 1. No significant interval change in the appearance of bilateral interstitial and airspace opacities consistent with the clinical diagnosis of ARDS. 2. The endotracheal tube is just above (8 mm) the carina. Consider withdrawing 2 cm for more optimal positioning. Otherwise, support apparatus in stable and satisfactory position.   Electronically Signed   By: Jacqulynn Cadet M.D.   On: 10/15/2013 08:29   Dg Chest Port 1 View  10/14/2013   CLINICAL DATA:  Pneumonia  EXAM: PORTABLE CHEST - 1 VIEW  COMPARISON:  DG CHEST 1V PORT dated 10/13/2013  FINDINGS: Endotracheal tube is appropriately positioned. Nasogastric tube tip terminates below the level of the hemidiaphragms but is not included in the field of view. Confluent left greater than right  diffuse airspace opacity pattern with air bronchogram formation again noted. No pneumothorax or pleural effusion. Right IJ central line tip terminates over the mid SVC. No acute osseous finding. Left glenohumeral joint degenerative change reidentified.  IMPRESSION: No significant change in diffuse bilateral airspace opacity pattern that could represent pneumonia or other alveolar filling process, although ARDS could have a similar appearance.   Electronically Signed   By: Conchita Paris M.D.   On: 10/14/2013 07:14    Assessment: First recent fevers are probably due to fungemia the called by his  previous right IJ central line that has been removed. He has been started on micafungin. I will repeat blood cultures today. He is making some slow progress in weaning from the ventilator.  Plan: 1. Continue trimethoprim-sulfamethoxazole, ganciclovir and micafungin 2. Discontinue empiric vancomycin and cefepime and prophylactic fluconazole 3. Repeat blood cultures  Michel Bickers, MD [facility] [place] [facility] [phone] pager   [phone] cell 10/15/2013, 10:27 AM

## 2013-10-15 NOTE — Progress Notes (Signed)
[facility]  2D Echocardiogram completed.  Mattison Stuckey L Tavarion Babington, RDCS 10/15/2013 10:54 AM

## 2013-10-15 NOTE — Progress Notes (Signed)
ANTIBIOTIC CONSULT NOTE - INITIAL  Pharmacy Consult for Micafungin Indication: blood culture positive for yeast in patient with HIV   Allergies  Allergen Reactions  . Penicillins Rash    Patient Measurements: Height: 5' 10"  (177.8 cm) Weight: 163 lb 5.8 oz (74.1 kg) IBW/kg (Calculated) : 73 Adjusted Body Weight:   Vital Signs: Temp: 99.4 F (37.4 C) (04/18 0000) Temp src: Axillary (04/18 0000) BP: 115/55 mmHg (04/18 0016) Pulse Rate: 81 (04/18 0016) Intake/Output from previous day: 04/17 0701 - 04/18 0700 In: 3348.8 [I.V.:1357.7; Blood:335; IV Piggyback:1656.1] Out: 2100 [Urine:2100] Intake/Output from this shift: Total I/O In: 900.7 [I.V.:372.6; IV Piggyback:528.1] Out: 1000 [Urine:1000]  Labs:  Recent Labs  10/12/13 0457 10/13/13 0400 10/14/13 0345  WBC 13.2* 9.8 8.0  HGB 8.3* 7.6* 6.8*  PLT 257 230 207  CREATININE 0.52 0.64 0.56   Estimated Creatinine Clearance: 117.9 ml/min (by C-G formula based on Cr of 0.56). No results found for this basename: VANCOTROUGH, VANCOPEAK, VANCORANDOM, GENTTROUGH, GENTPEAK, GENTRANDOM, TOBRATROUGH, TOBRAPEAK, TOBRARND, AMIKACINPEAK, AMIKACINTROU, AMIKACIN,  in the last 72 hours   Microbiology: Recent Results (from the past 720 hour(s))  CULTURE, BLOOD (ROUTINE X 2)     Status: None   Collection Time    09/30/2013 10:20 AM      Result Value Ref Range Status   Specimen Description BLOOD LEFT ANTECUBITAL   Final   Special Requests BOTTLES DRAWN AEROBIC AND ANAEROBIC 5 CC EACH   Final   Culture  Setup Time     Final   Value: 10/09/2013 14:52     Performed at [facility]   Culture     Final   Value: NO GROWTH 5 DAYS     Performed at [facility]   Report Status 10/07/2013 FINAL   Final  CULTURE, BLOOD (ROUTINE X 2)     Status: None   Collection Time    10/19/2013 10:35 AM      Result Value Ref Range Status   Specimen Description BLOOD RIGHT ANTECUBITAL   Final   Special Requests BOTTLES DRAWN AEROBIC AND  ANAEROBIC [facility] EACH   Final   Culture  Setup Time     Final   Value: 10/27/2013 14:52     Performed at [facility]   Culture     Final   Value: NO GROWTH 5 DAYS     Performed at [facility]   Report Status 10/07/2013 FINAL   Final  PNEUMOCYSTIS JIROVECI SMEAR BY DFA     Status: None   Collection Time    10/02/13 10:08 AM      Result Value Ref Range Status   Specimen Source-[facility] BRONCHIAL ALVEOLAR LAVAGE   Corrected   Comment: CORRECTED ON 04/05 AT 1024: PREVIOUSLY REPORTED AS BRONCHIAL WASHINGS   Pneumocystis jiroveci Ag NEGATIVE   Final   Comment: Performed at [facility], RESPIRATORY (NON-EXPECTORATED)     Status: None   Collection Time    10/02/13 10:08 AM      Result Value Ref Range Status   Specimen Description BRONCHIAL ALVEOLAR LAVAGE   Final   Special Requests Immunocompromised   Final   Gram Stain     Final   Value: RARE WBC PRESENT, PREDOMINANTLY PMN     RARE SQUAMOUS EPITHELIAL CELLS PRESENT     NO ORGANISMS SEEN     Performed at [facility]   Culture     Final   Value: NO  GROWTH 2 DAYS     Performed at [facility]   Report Status 10/04/2013 FINAL   Final  FUNGUS CULTURE W SMEAR     Status: None   Collection Time    10/02/13 10:08 AM      Result Value Ref Range Status   Specimen Description BRONCHIAL ALVEOLAR LAVAGE   Final   Special Requests Immunocompromised   Final   Fungal Smear     Final   Value: NO YEAST OR FUNGAL ELEMENTS SEEN     Performed at [facility]   Culture     Final   Value: CULTURE IN PROGRESS FOR FOUR WEEKS     Performed at [facility]   Report Status PENDING   Incomplete  AFB CULTURE WITH SMEAR     Status: None   Collection Time    10/02/13 10:08 AM      Result Value Ref Range Status   Specimen Description BRONCHIAL ALVEOLAR LAVAGE   Final   Special Requests Immunocompromised   Final   ACID FAST SMEAR     Final   Value: NO ACID FAST BACILLI SEEN      Performed at [facility]   Culture     Final   Value: CULTURE WILL BE EXAMINED FOR 6 WEEKS BEFORE ISSUING A FINAL REPORT     Performed at [facility]   Report Status PENDING   Incomplete  CMV CULTURE CMVC     Status: None   Collection Time    10/02/13 10:08 AM      Result Value Ref Range Status   Specimen Description BRONCHIAL ALVEOLAR LAVAGE   Final   Special Requests Immunocompromised   Final   Culture     Final   Value: No Cytomegalovirus identified in cell culture                                                                A negative result does not exclude the possibility of CMV infection;inappropriate specimen collection,storage and transport may lead to false      negative results.     Performed at [facility]   Report Status 10/05/2013 FINAL   Final  CULTURE, BLOOD (ROUTINE X 2)     Status: None   Collection Time    10/08/13 12:29 PM      Result Value Ref Range Status   Specimen Description BLOOD RIGHT ARM  2.5 ML IN [facility] BOTTLE   Final   Special Requests NONE   Final   Culture  Setup Time     Final   Value: 10/08/2013 17:24     Performed at [facility]   Culture     Final   Value: NO GROWTH 5 DAYS     Performed at [facility]   Report Status 10/14/2013 FINAL   Final  AFB CULTURE, BLOOD     Status: None   Collection Time    10/08/13 12:39 PM      Result Value Ref Range Status   Specimen Description BLOOD LEFT ARM  5 ML IN [facility] BOTTLE   Final   Special Requests NONE   Final   Culture     Final   Value: CULTURE  WILL BE EXAMINED FOR 6 WEEKS BEFORE ISSUING A FINAL REPORT     Performed at [facility]   Report Status PENDING   Incomplete  CULTURE, BLOOD (ROUTINE X 2)     Status: None   Collection Time    10/08/13 12:39 PM      Result Value Ref Range Status   Specimen Description BLOOD LEFT ARM  5 ML IN [facility] BOTTLE   Final   Special Requests NONE   Final   Culture  Setup Time     Final   Value: 10/08/2013 17:24      Performed at [facility]   Culture     Final   Value: NO GROWTH 5 DAYS     Performed at [facility]   Report Status 10/14/2013 FINAL   Final  RESPIRATORY VIRUS PANEL     Status: Abnormal   Collection Time    10/08/13  4:41 PM      Result Value Ref Range Status   Source - [facility] NASAL SWAB   Corrected   Comment: CORRECTED ON 04/13 AT 2130: PREVIOUSLY REPORTED AS NASAL SWAB   Respiratory Syncytial Virus A NOT DETECTED   Final   Respiratory Syncytial Virus B NOT DETECTED   Final   Influenza A NOT DETECTED   Final   Influenza B NOT DETECTED   Final   Parainfluenza 1 NOT DETECTED   Final   Parainfluenza 2 DETECTED (*)  Final   Parainfluenza 3 NOT DETECTED   Final   Metapneumovirus NOT DETECTED   Final   Rhinovirus NOT DETECTED   Final   Adenovirus NOT DETECTED   Final   Influenza A H1 NOT DETECTED   Final   Influenza A H3 NOT DETECTED   Final   Comment: (NOTE)           Normal Reference Range for each Analyte: NOT DETECTED     Testing performed using the Luminex xTAG Respiratory Viral Panel test     kit.     This test was developed and its performance characteristics determined     by [facility]. It has not been cleared or approved by the [place]     Food and Drug Administration. This test is used for clinical purposes.     It should not be regarded as investigational or for research. This     laboratory is certified under the [facility] (CLIA) as qualified to perform high complexity     clinical laboratory testing.     Performed at [place]     Status: None   Collection Time    10/10/13 10:45 AM      Result Value Ref Range Status   Specimen Description BRONCHIAL ALVEOLAR LAVAGE   Final   Special Requests Immunocompromised   Final   Fungal Smear     Final   Value: NO YEAST OR FUNGAL ELEMENTS SEEN     Performed at [facility]   Report Status 10/11/2013 FINAL   Final  M.  TUBERCULOSIS COMPLEX BY PCR     Status: None   Collection Time    10/10/13 10:45 AM      Result Value Ref Range Status   M. tuberculosis, Direct Not detected  Not detected Final   Comment: (NOTE)     This test(s) was developed and its performance characteristics      have  been determined by [facility],      [place], [place]. Performance characteristics refer to the analytical      performance of the test.     Performed at [place] ([facility]) BRONCHIAL ALVEOLAR LAVAGE   Final  FUNGUS CULTURE W SMEAR     Status: None   Collection Time    10/10/13 10:52 AM      Result Value Ref Range Status   Specimen Description BRONCHIAL ALVEOLAR LAVAGE   Final   Special Requests Immunocompromised   Final   Fungal Smear     Final   Value: NO YEAST OR FUNGAL ELEMENTS SEEN     Performed at [facility]   Culture     Final   Value: CULTURE IN PROGRESS FOR FOUR WEEKS     Performed at [facility]   Report Status PENDING   Incomplete  CULTURE, BAL-QUANTITATIVE     Status: None   Collection Time    10/10/13 10:54 AM      Result Value Ref Range Status   Specimen Description BRONCHIAL ALVEOLAR LAVAGE   Final   Special Requests NONE   Final   Gram Stain     Final   Value: RARE WBC PRESENT,BOTH PMN AND MONONUCLEAR     NO SQUAMOUS EPITHELIAL CELLS SEEN     NO ORGANISMS SEEN     Performed at [facility]     Final   Value: 100 COLONIES/ML     Performed at [facility]   Culture     Final   Value: Non-Pathogenic Oropharyngeal-type Flora Isolated.     Performed at [facility]   Report Status 10/12/2013 FINAL   Final  CULTURE, BLOOD (ROUTINE X 2)     Status: None   Collection Time    10/13/13  9:00 AM      Result Value Ref Range Status   Specimen Description BLOOD RIGHT ARM   Final   Special Requests BOTTLES DRAWN AEROBIC AND ANAEROBIC 8CC   Final   Culture  Setup Time     Final   Value: 10/13/2013 10:06      Performed at [facility]   Culture     Final   Value:        BLOOD CULTURE RECEIVED NO GROWTH TO DATE CULTURE WILL BE HELD FOR 5 DAYS BEFORE ISSUING A FINAL NEGATIVE REPORT     Performed at [facility]   Report Status PENDING   Incomplete  CULTURE, BLOOD (ROUTINE X 2)     Status: None   Collection Time    10/13/13  9:10 AM      Result Value Ref Range Status   Specimen Description BLOOD LEFT ARM   Final   Special Requests BOTTLES DRAWN AEROBIC AND ANAEROBIC [facility]   Final   Culture  Setup Time     Final   Value: 10/13/2013 10:06     Performed at [facility]   Culture     Final   Value: YEAST     Note: Gram Stain Report Called to,Read Back By and Verified With: TZEZT WOODS 10/15/13 1232A [place]     Performed at [facility]   Report Status PENDING   Incomplete  URINE CULTURE     Status: None   Collection Time    10/13/13 12:27 PM      Result Value Ref Range Status  Specimen Description URINE, CATHETERIZED   Final   Special Requests NONE   Final   Culture  Setup Time     Final   Value: 10/13/2013 14:45     Performed at [facility]     Final   Value: 5,000 COLONIES/ML     Performed at [facility]   Culture     Final   Value: INSIGNIFICANT GROWTH     Performed at [facility]   Report Status 10/14/2013 FINAL   Final  CULTURE, RESPIRATORY (NON-EXPECTORATED)     Status: None   Collection Time    10/13/13  4:04 PM      Result Value Ref Range Status   Specimen Description ENDOTRACHEAL   Final   Special Requests NONE   Final   Gram Stain     Final   Value: ABUNDANT WBC PRESENT,BOTH PMN AND MONONUCLEAR     NO SQUAMOUS EPITHELIAL CELLS SEEN     NO ORGANISMS SEEN     Performed at [facility]   Culture     Final   Value: NO GROWTH     Performed at [facility]   Report Status PENDING   Incomplete    Medical History: Past Medical History  Diagnosis Date  . Hypertension   .  Hyperlipidemia   . Chronic headaches   . HIV (human immunodeficiency virus infection)     Medications:  Anti-infectives   Start     Dose/Rate Route Frequency Ordered Stop   10/15/13 0100  micafungin (MYCAMINE) 150 mg in sodium chloride 0.9 % 100 mL IVPB     150 mg 100 mL/hr over 1 Hours Intravenous Daily at bedtime 10/15/13 0056     10/13/13 1800  vancomycin (VANCOCIN) IVPB 1000 mg/200 mL premix     1,000 mg 200 mL/hr over 60 Minutes Intravenous Every 8 hours 10/13/13 0836     10/13/13 1800  ceFEPIme (MAXIPIME) 2 g in dextrose 5 % 50 mL IVPB     2 g 100 mL/hr over 30 Minutes Intravenous Every 8 hours 10/13/13 0836     10/13/13 0900  vancomycin (VANCOCIN) 1,750 mg in sodium chloride 0.9 % 500 mL IVPB     1,750 mg 250 mL/hr over 120 Minutes Intravenous  Once 10/13/13 0836 10/13/13 1200   10/13/13 0900  ceFEPIme (MAXIPIME) 2 g in dextrose 5 % 50 mL IVPB     2 g 100 mL/hr over 30 Minutes Intravenous  Once 10/13/13 0836 10/13/13 1000   10/11/13 1600  fluconazole (DIFLUCAN) tablet 100 mg     100 mg Oral Weekly 10/11/13 1553     10/08/13 1400  ceFEPIme (MAXIPIME) 1 g in dextrose 5 % 50 mL IVPB  Status:  Discontinued     1 g 100 mL/hr over 30 Minutes Intravenous 3 times per day 10/08/13 1119 10/11/13 1553   10/06/13 1000  azithromycin (ZITHROMAX) tablet 1,200 mg     1,200 mg Oral Weekly 10/20/2013 1355     10/03/13 1800  ganciclovir (CYTOVENE) 385 mg in sodium chloride 0.9 % 100 mL IVPB     5 mg/kg  76.5 kg 100 mL/hr over 60 Minutes Intravenous Every 12 hours 10/03/13 1723     10/03/13 1000  dolutegravir (TIVICAY) tablet 50 mg    Comments:  GIVE DOWN THE NG TUBE   50 mg Oral Daily 10/02/13 1554     10/03/13 1000  emtricitabine-tenofovir (TRUVADA) 200-300 MG per tablet  1 tablet     1 tablet Oral Daily 10/02/13 1554     10/02/13 0800  elvitegravir-cobicistat-emtricitabine-tenofovir (STRIBILD) 150-150-200-300 MG tablet 1 tablet  Status:  Discontinued     1 tablet Oral Daily with breakfast  10/03/2013 1355 10/02/13 1554   10/25/2013 2200  ceFEPIme (MAXIPIME) 1 g in dextrose 5 % 50 mL IVPB  Status:  Discontinued     1 g 100 mL/hr over 30 Minutes Intravenous 3 times per day 09/30/2013 1141 10/08/13 0934   10/18/2013 2000  vancomycin (VANCOCIN) IVPB 750 mg/150 ml premix  Status:  Discontinued     750 mg 150 mL/hr over 60 Minutes Intravenous Every 8 hours 10/08/2013 1141 10/19/2013 1444   10/11/2013 2000  sulfamethoxazole-trimethoprim (BACTRIM) 450 mg in dextrose 5 % 500 mL IVPB     450 mg 352.1 mL/hr over 90 Minutes Intravenous Every 8 hours 09/29/2013 1141     10/25/2013 1115  ceFEPIme (MAXIPIME) 2 g in dextrose 5 % 50 mL IVPB  Status:  Discontinued     2 g 100 mL/hr over 30 Minutes Intravenous Every 12 hours 09/30/2013 1104 10/25/2013 1141   10/27/2013 1100  sulfamethoxazole-trimethoprim (BACTRIM) 450 mg in dextrose 5 % 500 mL IVPB     450 mg 352.1 mL/hr over 90 Minutes Intravenous  Once 10/14/2013 1054 10/04/13 1643   10/26/2013 1045  vancomycin (VANCOCIN) 1,500 mg in sodium chloride 0.9 % 500 mL IVPB     1,500 mg 250 mL/hr over 120 Minutes Intravenous  Once 10/12/2013 1038 10/17/2013 1300   10/20/2013 1030  vancomycin (VANCOCIN) IVPB 1000 mg/200 mL premix  Status:  Discontinued     1,000 mg 200 mL/hr over 60 Minutes Intravenous  Once 10/17/2013 1024 10/03/2013 1035     Assessment: Patient on fluconazole 143m daily, but also with blood culture positive for yeast in patient with HIV.  Will dose for fluconazole refractory HIV infection.  Goal of Therapy:  Dosing based on labeled FDA dosing  Plan:  Micafungin 1526miv q24hr  [employer]4/18/2015,12:58 AM

## 2013-10-16 ENCOUNTER — Inpatient Hospital Stay (HOSPITAL_COMMUNITY): Payer: BC Managed Care – PPO

## 2013-10-16 LAB — TYPE AND SCREEN
ABO/RH(D): A POS
ANTIBODY SCREEN: NEGATIVE
Unit division: 0

## 2013-10-16 LAB — CBC
HEMATOCRIT: 25.7 % — AB (ref 39.0–52.0)
HEMOGLOBIN: 8.4 g/dL — AB (ref 13.0–17.0)
MCH: 29.5 pg (ref 26.0–34.0)
MCHC: 32.7 g/dL (ref 30.0–36.0)
MCV: 90.2 fL (ref 78.0–100.0)
Platelets: 245 10*3/uL (ref 150–400)
RBC: 2.85 MIL/uL — ABNORMAL LOW (ref 4.22–5.81)
RDW: 16.9 % — ABNORMAL HIGH (ref 11.5–15.5)
WBC: 9.3 10*3/uL (ref 4.0–10.5)

## 2013-10-16 LAB — GLUCOSE, CAPILLARY
GLUCOSE-CAPILLARY: 107 mg/dL — AB (ref 70–99)
GLUCOSE-CAPILLARY: 141 mg/dL — AB (ref 70–99)
Glucose-Capillary: 102 mg/dL — ABNORMAL HIGH (ref 70–99)
Glucose-Capillary: 115 mg/dL — ABNORMAL HIGH (ref 70–99)
Glucose-Capillary: 143 mg/dL — ABNORMAL HIGH (ref 70–99)
Glucose-Capillary: 97 mg/dL (ref 70–99)

## 2013-10-16 LAB — BASIC METABOLIC PANEL
BUN: 22 mg/dL (ref 6–23)
CALCIUM: 7.8 mg/dL — AB (ref 8.4–10.5)
CO2: 34 mEq/L — ABNORMAL HIGH (ref 19–32)
Chloride: 91 mEq/L — ABNORMAL LOW (ref 96–112)
Creatinine, Ser: 0.55 mg/dL (ref 0.50–1.35)
GFR calc Af Amer: >90 mL/min (ref >90)
GFR calc non Af Amer: >90 mL/min (ref >90)
GLUCOSE: 99 mg/dL (ref 70–99)
POTASSIUM: 3.8 meq/L (ref 3.7–5.3)
Sodium: 133 mEq/L — ABNORMAL LOW (ref 137–147)

## 2013-10-16 LAB — CULTURE, RESPIRATORY

## 2013-10-16 LAB — CULTURE, RESPIRATORY W GRAM STAIN

## 2013-10-16 MED ORDER — SODIUM CHLORIDE 0.9 % IV SOLN
1.0000 mg/h | INTRAVENOUS | Status: DC
  Administered 2013-10-16: 3 mg/h via INTRAVENOUS
  Administered 2013-10-17: 5 mg/h via INTRAVENOUS
  Administered 2013-10-18: 10 mg/h via INTRAVENOUS
  Administered 2013-10-18: 2 mg/h via INTRAVENOUS
  Administered 2013-10-18 – 2013-10-21 (×12): 10 mg/h via INTRAVENOUS
  Filled 2013-10-16 (×8): qty 10
  Filled 2013-10-16: qty 20
  Filled 2013-10-16 (×9): qty 10

## 2013-10-16 NOTE — Progress Notes (Addendum)
PULMONARY / CRITICAL CARE MEDICINE   Name: William Bender MRN: [id number] DOB: 10/04/1965    ADMISSION DATE:  10/12/2013 CONSULTATION DATE:  10/20/2013  REFERRING MD :  Rancour  CHIEF COMPLAINT:  Shortness of breath, fever, cough  BRIEF PATIENT DESCRIPTION: 48 y/o male recently diagnosed with HIV/AIDS -CD4 =30 on 3/31 and treated empirically for PCP [facility] with acute hypoxemic respiratory failure. Note that sputum PCP was neg 3/18 but was on bactrim prior  SIGNIFICANT EVENTS: 4/09 Pneumomediastinum, nimbex added 4/10 desat, lasix started, peep improved to 10   4/13 ETT had migrated, re-advanced and verified w/ FOB. Obtained BAL at this time  4/16 Tm 101F 4/17 1 unit PRBC for Hb 6.8 4/18 Yeast in blood >> added micafungin 4/19 Tolerating pressure support  STUDIES:  4/4 CT angio > no PE, diffuse bilateral GGO and patchy consolidation worse in the bases L > R 4/9 CT chest > pneumomediastinum with subcutaneous emphysema, severe diffuse GGO b/l 4/15 RUE US> NEGATIVE 4/18 TTE > mild LVH, EF 55 to 60%  LINES / TUBES: 4/5 ETT >>4/13 (exchanged)>>> 4/5 R IJ CVL >> 4/17 4/17 Lt PICC >>   CULTURES (truncated 4/18): 4/4 CMV pcr >> POSITIVE 4/5 fungal  bal >> 4/5 afb  bal >> 4/11 resp virus panel: + parainfluenza 2 4/11 AFB blood cx >>> 4/13 fungal BAL >>> 4/13 CMV PCR >>> 101,862 copies/ml 4/13 fungitell >>> 4/16 Blood >>> Yeast in one of two >>> 4/16 Sputum >>> 4/16 Urine >>> negative  ANTIBIOTICS: 4/4 Bactrim >>> 4/4 Vanc >>> 4/4 4/4 Cefepime >>> 4/14 4/6 Gancyclovir >>> 4/16 Vanc >>> 4/18 4/16 cefepime >>> 4/18 4/17 Micafungin >>>  SUBJECTIVE:  Remains on FiO2 50% and PEEP 8.  Tolerating pressure support trials.  Follows simple commands on [facility].  No fever since 4/17.  VITAL SIGNS: Temp:  [97.2 F (36.2 C)-100.2 F (37.9 C)] 100.2 F (37.9 C) (04/19 0800) Pulse Rate:  [81-108] 102 (04/19 0600) Resp:  [10-34] 14 (04/19 0600) BP:  (104-145)/(49-76) 125/59 mmHg (04/19 0600) SpO2:  [88 %-96 %] 92 % (04/19 0600) FiO2 (%):  [45 %-50 %] 50 % (04/19 0740) Weight:  [164 lb 7.4 oz (74.6 kg)] 164 lb 7.4 oz (74.6 kg) (04/19 0500) VENTILATOR SETTINGS: Vent Mode:  [-] PRVC FiO2 (%):  [45 %-50 %] 50 % Set Rate:  [26 bmp] 26 bmp Vt Set:  [440 mL] 440 mL PEEP:  [8 cmH20] 8 cmH20 Plateau Pressure:  [12 cmH20-22 cmH20] 17 cmH20 INTAKE / OUTPUT: Intake/Output     04/18 0701 - 04/19 0700 04/19 0701 - 04/20 0700   I.V. (mL/kg) 1593.8 (21.4)    Blood     NG/GT 460    IV Piggyback 1956.3    Total Intake(mL/kg) 4010.1 (53.8)    Urine (mL/kg/hr) 3370 (1.9)    Total Output 3370     Net +640.1            PHYSICAL EXAMINATION: Gen: no distress HEENT: ETT in place PULM: scattered rhonchi, no wheeze CV: regular, tachycardic AB: soft, non tender Neuro: RASS -2 Ext: 1+ edema Derm: sacral decub noted   LABS: PULMONARY  Recent Labs Lab 10/10/13 0419 10/11/13 0400  PHART 7.441 7.460*  PCO2ART 66.1* 59.6*  PO2ART 88.2 87.0  HCO3 44.3* 42.0*  TCO2 41.0 39.5  O2SAT 95.9 94.8    CBC  Recent Labs Lab 10/14/13 0345 10/15/13 0531 10/16/13 0430  HGB 6.8* 7.8* 8.4*  HCT 21.0* 23.5* 25.7*  WBC 8.0 9.8 9.3  PLT 207 237 245    COAGULATION  Recent Labs Lab 10/09/13 1045  INR 0.98    CHEMISTRY  Recent Labs Lab 10/11/13 0414 10/12/13 0457 10/13/13 0400 10/14/13 0345 10/15/13 0531 10/16/13 0430  NA 131* 128* 132* 132* 134* 133*  K 3.8 3.9 3.6* 3.6* 3.8 3.8  CL 81* 82* 87* 89* 92* 91*  CO2 43* 40* 39* 36* 34* 34*  GLUCOSE 147* 186* 111* 132* 137* 99  BUN 23 25* 26* 21 19 22   CREATININE 0.61 0.52 0.64 0.56 0.51 0.55  CALCIUM 7.9* 8.2* 8.3* 7.6* 7.9* 7.8*  MG 2.0 2.0 2.0 2.0  --   --   PHOS 3.4 3.3 4.1 4.1  --   --    Estimated Creatinine Clearance: 117.9 ml/min (by C-G formula based on Cr of 0.55).   LIVER  Recent Labs Lab 10/09/13 1045 10/10/13 0605 10/11/13 0414 10/12/13 0457 10/13/13 0400   AST  --  40* 43* 52* 42*  ALT  --  40 46 53 62*  ALKPHOS  --  57 65 64 64  BILITOT  --  <0.2* <0.2* <0.2* <0.2*  PROT  --  4.9* 5.1* 5.0* 4.9*  ALBUMIN  --  1.6* 1.7* 1.7* 1.7*  INR 0.98  --   --   --   --     INFECTIOUS  Recent Labs Lab 10/11/13 1100  LATICACIDVEN 2.4*    ENDOCRINE CBG (last 3)   Recent Labs  10/15/13 2339 10/16/13 0352 10/16/13 0737  GLUCAP 143* 107* 102*     IMAGING x48h  Dg Chest Port 1 View  10/15/2013   CLINICAL DATA:  Follow-up ARDS  EXAM: PORTABLE CHEST - 1 VIEW  COMPARISON:  Prior chest x-ray 10/14/2013  FINDINGS: The endotracheal tube is 8 mm above the carina. Stable position of right IJ approach central venous catheter. Tip projects over the upper SVC. And a nasogastric tube is incompletely imaged but can be identified below the diaphragm within the stomach. Essentially stable appearance of a bilateral interstitial and airspace opacities. A left upper extremity PICC is present. The tip overlies the superior cavoatrial junction. No large effusion. No acute osseous abnormality.  IMPRESSION: 1. No significant interval change in the appearance of bilateral interstitial and airspace opacities consistent with the clinical diagnosis of ARDS. 2. The endotracheal tube is just above (8 mm) the carina. Consider withdrawing 2 cm for more optimal positioning. Otherwise, support apparatus in stable and satisfactory position.   Electronically Signed   By: Malachy MoanHeath  McCullough M.D.   On: 10/15/2013 08:29    ASSESSMENT / PLAN:  PULMONARY A:  Acute respiratory failure with ARDS 2nd to Parainfluenza PNA and presumed PCP  in setting of HIV. Pneumomediastinum developed 4/09 >> resolved. ETT exchanged 4/13 d/t leak. Increased Oxygen needs 4/16 >> better 4/17. P:   F/u CXR Adjust PEEP/FiO2 to keep SpO2 > 88% Changed solumedrol 20 mg IV q12 h on 4/18 >> using in setting of presumed PCP and immune reconstitution  Will need trach at some point >> likely week of  4/20 Okay to wean on pressure support as tolerated when FiO2 at 50% or less, or PEEP at 8 or less  INFECTIOUS A:   PNA in setting of HIV >> viral pneumonitis (+ parainfluenza 2) +/-bacterial, PCP. Stage II buttock ulcers. CMV viremia. His parainfluenza 2 was positive >> clinically Significant with ARDS. Fever 101F on 4/16 >> Yeast in blood cx from 4/16; TTE negative for vegetation.  P:   Day 3 of micafungin Continue bactrim (presumed PCP), ganciclovir (CMV) per ID Continue zithromax, diflucan for prophylaxis Continue anti-retroviral tx (truvada) per ID Wound care consulted to assess sacral decub  CARDIOVASCULAR A:  Severe sepsis >> hemodynamics improved. Hx of HTN. 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 Hypervolemia. Mild contraction alkalosis. P:   Continue diuresis as tolerate with goal even to negative fluid balance as tolerated Monitor renal fx, urine outpt, electrolytes  GASTROINTESTINAL A:   Protein calorie malnutrition. P:   Protonix for SUP Tube feeds to goal  HEMATOLOGIC A:   Anemia of critical illness and chronic disease >> no evidence for bleeding. RUE swelling >> doppler negative from 4/15. P:  F/u CBC Continue LMWH for DVT prevention  ENDOCRINE A:   Steroid induced hyperglycemia. P:   SSI  NEUROLOGIC A:    Anxiety, pain control, sedation >> changed to dilaudid 4/14. P:   Goal RASS -2 >> reduce sedation after trach  Updated family at bedside.  Explained that his recovery process will take time (weeks to months).  Explained that he is transitioning from acute phase of his illness to more chronic, critically ill stage.  Assuming no additional set backs, he will then need to have support devices in place to carry him through his recovery (ie tracheostomy, Gtube).  He likely will be ready for trach later this week.  Would  then plan to wean of sedation and push mobility/physical therapy.  I explained to the family the concept of LTAC as next step in his recovery when ready.  CC time 35 minutes.  Coralyn Helling, MD [facility] Pulmonary/Critical Care 10/16/2013, 8:30 AM Pager:  [phone] After 3pm call: [phone]

## 2013-10-16 NOTE — Progress Notes (Signed)
Patient ID: William Bender, male   DOB: 05/31/1966, 48 y.o.   MRN: [id number]        [facility]    Date of Admission:  10/17/2013           Day 16 trimethoprim-sulfamethoxazole and steroids        Day 14 ganciclovir        Day 2 micafungin Principal Problem:   Pneumonia Active Problems:   Acute respiratory failure   AIDS   PCP (pneumocystis carinii pneumonia)   CMV (cytomegalovirus)   Sacral decubitus ulcer, stage II   Hyponatremia   Pneumomediastinum   Fungemia   . antiseptic oral rinse  15 mL Mouth Rinse QID  . azithromycin  1,200 mg Oral Weekly  . chlorhexidine  15 mL Mouth Rinse BID  . docusate  100 mg Oral BID  . dolutegravir  50 mg Oral Daily  . emtricitabine-tenofovir  1 tablet Oral Daily  . enoxaparin (LOVENOX) injection  40 mg Subcutaneous Daily  . feeding supplement (OXEPA)  1,000 mL Per Tube Q24H  . feeding supplement (PRO-STAT SUGAR FREE 64)  30 mL Per Tube 5 X Daily  . furosemide  40 mg Per Tube Q12H  . ganciclovir (CYTOVENE) IV  5 mg/kg Intravenous Q12H  . insulin aspart  0-15 Units Subcutaneous 6 times per day  . methylPREDNISolone (SOLU-MEDROL) injection  20 mg Intravenous Q12H  . micafungin (MYCAMINE) IV  150 mg Intravenous QHS  . multivitamin  5 mL Per Tube Daily  . pantoprazole sodium  40 mg Per Tube Daily  . QUEtiapine  50 mg Per Tube BID  . sodium chloride  10-40 mL Intracatheter Q12H  . sulfamethoxazole-trimethoprim  450 mg Intravenous Q8H   Objective: Temp:  [97.2 F (36.2 C)-100.2 F (37.9 C)] 100.2 F (37.9 C) (04/19 0800) Pulse Rate:  [84-108] 101 (04/19 0959) Resp:  [10-34] 17 (04/19 0959) BP: (109-145)/(51-76) 128/58 mmHg (04/19 0959) SpO2:  [88 %-96 %] 92 % (04/19 0959) FiO2 (%):  [45 %-50 %] 50 % (04/19 0959) Weight:  [74.6 kg (164 lb 7.4 oz)] 74.6 kg (164 lb 7.4 oz) (04/19 0500)  General: He remains sedated on a ventilator but is intermittently alert enough to interact appropriately He has tolerated some  weaning of the ventilator.   Lab Results Lab Results  Component Value Date   WBC 9.3 10/16/2013   HGB 8.4* 10/16/2013   HCT 25.7* 10/16/2013   MCV 90.2 10/16/2013   PLT 245 10/16/2013    Lab Results  Component Value Date   CREATININE 0.55 10/16/2013   BUN 22 10/16/2013   NA 133* 10/16/2013   K 3.8 10/16/2013   CL 91* 10/16/2013   CO2 34* 10/16/2013    Lab Results  Component Value Date   ALT 62* 10/13/2013   AST 42* 10/13/2013   ALKPHOS 64 10/13/2013   BILITOT <0.2* 10/13/2013      Microbiology: Recent Results (from the past 240 hour(s))  CULTURE, BLOOD (ROUTINE X 2)     Status: None   Collection Time    10/08/13 12:29 PM      Result Value Ref Range Status   Specimen Description BLOOD RIGHT ARM  2.5 ML IN [facility] BOTTLE   Final   Special Requests NONE   Final   Culture  Setup Time     Final   Value: 10/08/2013 17:24     Performed at [place]     Final  Value: NO GROWTH 5 DAYS     Performed at [facility]   Report Status 10/14/2013 FINAL   Final  AFB CULTURE, BLOOD     Status: None   Collection Time    10/08/13 12:39 PM      Result Value Ref Range Status   Specimen Description BLOOD LEFT ARM  5 ML IN [facility] BOTTLE   Final   Special Requests NONE   Final   Culture     Final   Value: CULTURE WILL BE EXAMINED FOR 6 WEEKS BEFORE ISSUING A FINAL REPORT     Performed at [facility]   Report Status PENDING   Incomplete  CULTURE, BLOOD (ROUTINE X 2)     Status: None   Collection Time    10/08/13 12:39 PM      Result Value Ref Range Status   Specimen Description BLOOD LEFT ARM  5 ML IN [facility] BOTTLE   Final   Special Requests NONE   Final   Culture  Setup Time     Final   Value: 10/08/2013 17:24     Performed at [facility]   Culture     Final   Value: NO GROWTH 5 DAYS     Performed at [facility]   Report Status 10/14/2013 FINAL   Final  RESPIRATORY VIRUS PANEL     Status: Abnormal   Collection Time    10/08/13  4:41 PM       Result Value Ref Range Status   Source - RVPAN NASAL SWAB   Corrected   Comment: CORRECTED ON 04/13 AT 2130: PREVIOUSLY REPORTED AS NASAL SWAB   Respiratory Syncytial Virus A NOT DETECTED   Final   Respiratory Syncytial Virus B NOT DETECTED   Final   Influenza A NOT DETECTED   Final   Influenza B NOT DETECTED   Final   Parainfluenza 1 NOT DETECTED   Final   Parainfluenza 2 DETECTED (*)  Final   Parainfluenza 3 NOT DETECTED   Final   Metapneumovirus NOT DETECTED   Final   Rhinovirus NOT DETECTED   Final   Adenovirus NOT DETECTED   Final   Influenza A H1 NOT DETECTED   Final   Influenza A H3 NOT DETECTED   Final   Comment: (NOTE)           Normal Reference Range for each Analyte: NOT DETECTED     Testing performed using the Luminex xTAG Respiratory Viral Panel test     kit.     This test was developed and its performance characteristics determined     by [facility]. It has not been cleared or approved by the [place]     Food and Drug Administration. This test is used for clinical purposes.     It should not be regarded as investigational or for research. This     laboratory is certified under the [place] (CLIA) as qualified to perform high complexity     clinical laboratory testing.     Performed at [place]     Status: None   Collection Time    10/10/13 10:45 AM      Result Value Ref Range Status   Specimen Description BRONCHIAL ALVEOLAR LAVAGE   Final   Special Requests Immunocompromised   Final   Fungal Smear     Final   Value: NO  YEAST OR FUNGAL ELEMENTS SEEN     Performed at [facility]   Report Status 10/11/2013 FINAL   Final  M. TUBERCULOSIS COMPLEX BY PCR     Status: None   Collection Time    10/10/13 10:45 AM      Result Value Ref Range Status   M. tuberculosis, Direct Not detected  Not detected Final   Comment: (NOTE)     This test(s) was developed and its performance  characteristics      have been determined by [facility],      [place], [place]. Performance characteristics refer to the analytical      performance of the test.     Performed at [place] ([facility]) BRONCHIAL ALVEOLAR LAVAGE   Final  FUNGUS CULTURE W SMEAR     Status: None   Collection Time    10/10/13 10:52 AM      Result Value Ref Range Status   Specimen Description BRONCHIAL ALVEOLAR LAVAGE   Final   Special Requests Immunocompromised   Final   Fungal Smear     Final   Value: NO YEAST OR FUNGAL ELEMENTS SEEN     Performed at [facility]   Culture     Final   Value: CULTURE IN PROGRESS FOR FOUR WEEKS     Performed at [facility]   Report Status PENDING   Incomplete  CULTURE, BAL-QUANTITATIVE     Status: None   Collection Time    10/10/13 10:54 AM      Result Value Ref Range Status   Specimen Description BRONCHIAL ALVEOLAR LAVAGE   Final   Special Requests NONE   Final   Gram Stain     Final   Value: RARE WBC PRESENT,BOTH PMN AND MONONUCLEAR     NO SQUAMOUS EPITHELIAL CELLS SEEN     NO ORGANISMS SEEN     Performed at [facility]     Final   Value: 100 COLONIES/ML     Performed at [facility]   Culture     Final   Value: Non-Pathogenic Oropharyngeal-type Flora Isolated.     Performed at [facility]   Report Status 10/12/2013 FINAL   Final  CULTURE, BLOOD (ROUTINE X 2)     Status: None   Collection Time    10/13/13  9:00 AM      Result Value Ref Range Status   Specimen Description BLOOD RIGHT ARM   Final   Special Requests BOTTLES DRAWN AEROBIC AND ANAEROBIC 8CC   Final   Culture  Setup Time     Final   Value: 10/13/2013 10:06     Performed at [facility]   Culture     Final   Value:        BLOOD CULTURE RECEIVED NO GROWTH TO DATE CULTURE WILL BE HELD FOR 5 DAYS BEFORE ISSUING A FINAL NEGATIVE REPORT     Performed at [facility]   Report Status PENDING    Incomplete  CULTURE, BLOOD (ROUTINE X 2)     Status: None   Collection Time    10/13/13  9:10 AM      Result Value Ref Range Status   Specimen Description BLOOD LEFT ARM   Final   Special Requests BOTTLES DRAWN AEROBIC AND ANAEROBIC 6CC   Final   Culture  Setup Time     Final   Value:  10/13/2013 10:06     Performed at [facility]   Culture     Final   Value: YEAST     Note: Gram Stain Report Called to,Read Back By and Verified With: TZEZT WOODS 10/15/13 1232A [place]     Performed at [facility]   Report Status PENDING   Incomplete  URINE CULTURE     Status: None   Collection Time    10/13/13 12:27 PM      Result Value Ref Range Status   Specimen Description URINE, CATHETERIZED   Final   Special Requests NONE   Final   Culture  Setup Time     Final   Value: 10/13/2013 14:45     Performed at [facility]     Final   Value: 5,000 COLONIES/ML     Performed at [facility]   Culture     Final   Value: INSIGNIFICANT GROWTH     Performed at [facility]   Report Status 10/14/2013 FINAL   Final  CULTURE, RESPIRATORY (NON-EXPECTORATED)     Status: None   Collection Time    10/13/13  4:04 PM      Result Value Ref Range Status   Specimen Description ENDOTRACHEAL   Final   Special Requests NONE   Final   Gram Stain     Final   Value: ABUNDANT WBC PRESENT,BOTH PMN AND MONONUCLEAR     NO SQUAMOUS EPITHELIAL CELLS SEEN     NO ORGANISMS SEEN     Performed at [facility]   Culture     Final   Value: Non-Pathogenic Oropharyngeal-type Flora Isolated.     Performed at [facility]   Report Status PENDING   Incomplete    Studies/Results: Dg Chest Port 1 View  10/15/2013   CLINICAL DATA:  Follow-up ARDS  EXAM: PORTABLE CHEST - 1 VIEW  COMPARISON:  Prior chest x-ray 10/14/2013  FINDINGS: The endotracheal tube is 8 mm above the carina. Stable position of right IJ approach central venous catheter. Tip projects over the  upper SVC. And a nasogastric tube is incompletely imaged but can be identified below the diaphragm within the stomach. Essentially stable appearance of a bilateral interstitial and airspace opacities. A left upper extremity PICC is present. The tip overlies the superior cavoatrial junction. No large effusion. No acute osseous abnormality.  IMPRESSION: 1. No significant interval change in the appearance of bilateral interstitial and airspace opacities consistent with the clinical diagnosis of ARDS. 2. The endotracheal tube is just above (8 mm) the carina. Consider withdrawing 2 cm for more optimal positioning. Otherwise, support apparatus in stable and satisfactory position.   Electronically Signed   By: Jacqulynn Cadet M.D.   On: 10/15/2013 08:29    Assessment: He is improving slowly on therapy for multifactorial ammonia and transient ischemia.  Plan: 1. Continue trimethoprim-sulfamethoxazole, ganciclovir and micafungin  Michel Bickers, MD [facility] for Infectious [place] [phone] pager   [phone] cell 10/16/2013, 10:26 AM

## 2013-10-17 ENCOUNTER — Inpatient Hospital Stay (HOSPITAL_COMMUNITY): Payer: BC Managed Care – PPO

## 2013-10-17 LAB — CULTURE, BLOOD (ROUTINE X 2)

## 2013-10-17 LAB — BASIC METABOLIC PANEL
BUN: 19 mg/dL (ref 6–23)
CALCIUM: 7.5 mg/dL — AB (ref 8.4–10.5)
CO2: 29 mEq/L (ref 19–32)
Chloride: 96 mEq/L (ref 96–112)
Creatinine, Ser: 0.48 mg/dL — ABNORMAL LOW (ref 0.50–1.35)
Glucose, Bld: 128 mg/dL — ABNORMAL HIGH (ref 70–99)
POTASSIUM: 3.4 meq/L — AB (ref 3.7–5.3)
SODIUM: 136 meq/L — AB (ref 137–147)

## 2013-10-17 LAB — OCCULT BLOOD X 1 CARD TO LAB, STOOL: Fecal Occult Bld: POSITIVE — AB

## 2013-10-17 LAB — GLUCOSE, CAPILLARY
GLUCOSE-CAPILLARY: 136 mg/dL — AB (ref 70–99)
GLUCOSE-CAPILLARY: 90 mg/dL (ref 70–99)
Glucose-Capillary: 119 mg/dL — ABNORMAL HIGH (ref 70–99)
Glucose-Capillary: 134 mg/dL — ABNORMAL HIGH (ref 70–99)
Glucose-Capillary: 139 mg/dL — ABNORMAL HIGH (ref 70–99)
Glucose-Capillary: 157 mg/dL — ABNORMAL HIGH (ref 70–99)

## 2013-10-17 LAB — PROTIME-INR
INR: 1.13 (ref 0.00–1.49)
PROTHROMBIN TIME: 14.3 s (ref 11.6–15.2)

## 2013-10-17 LAB — CBC
HCT: 23.5 % — ABNORMAL LOW (ref 39.0–52.0)
HEMOGLOBIN: 7.7 g/dL — AB (ref 13.0–17.0)
MCH: 29.5 pg (ref 26.0–34.0)
MCHC: 32.8 g/dL (ref 30.0–36.0)
MCV: 90 fL (ref 78.0–100.0)
PLATELETS: 200 10*3/uL (ref 150–400)
RBC: 2.61 MIL/uL — AB (ref 4.22–5.81)
RDW: 16.8 % — ABNORMAL HIGH (ref 11.5–15.5)
WBC: 9.8 10*3/uL (ref 4.0–10.5)

## 2013-10-17 LAB — MISCELLANEOUS TEST

## 2013-10-17 MED ORDER — POTASSIUM CHLORIDE CRYS ER 20 MEQ PO TBCR: 40.0000 meq | EXTENDED_RELEASE_TABLET | Freq: Three times a day (TID) | ORAL | Status: DC

## 2013-10-17 MED ORDER — POTASSIUM CHLORIDE 20 MEQ/15ML (10%) PO LIQD
40.0000 meq | Freq: Three times a day (TID) | ORAL | Status: AC
  Administered 2013-10-17 (×2): 40 meq via ORAL
  Filled 2013-10-17 (×2): qty 30

## 2013-10-17 MED ORDER — FUROSEMIDE 10 MG/ML IJ SOLN
40.0000 mg | Freq: Four times a day (QID) | INTRAMUSCULAR | Status: AC
  Administered 2013-10-17 (×3): 40 mg via INTRAVENOUS
  Filled 2013-10-17 (×3): qty 4

## 2013-10-17 MED ORDER — VITAL HIGH PROTEIN PO LIQD
1000.0000 mL | ORAL | Status: DC
  Administered 2013-10-17 – 2013-10-18 (×2): 1000 mL
  Administered 2013-10-18: 08:00:00
  Filled 2013-10-17 (×2): qty 1000

## 2013-10-17 MED ORDER — FUROSEMIDE 10 MG/ML IJ SOLN: 40.0000 mg | Freq: Two times a day (BID) | INTRAMUSCULAR | Status: DC

## 2013-10-17 MED ORDER — POTASSIUM CHLORIDE 20 MEQ/15ML (10%) PO LIQD: 40.0000 meq | Freq: Two times a day (BID) | ORAL | Status: DC

## 2013-10-17 MED ORDER — COLLAGENASE 250 UNIT/GM EX OINT
TOPICAL_OINTMENT | Freq: Every day | CUTANEOUS | Status: DC
  Administered 2013-10-17 – 2013-10-20 (×3): via TOPICAL
  Administered 2013-10-20: 1 via TOPICAL
  Administered 2013-10-21 – 2013-10-23 (×3): via TOPICAL
  Administered 2013-10-25: 1 via TOPICAL
  Administered 2013-10-27 – 2013-10-31 (×5): via TOPICAL
  Administered 2013-11-01: 1 via TOPICAL
  Administered 2013-11-02: 11:00:00 via TOPICAL
  Filled 2013-10-17 (×3): qty 30

## 2013-10-17 MED ORDER — FREE WATER
200.0000 mL | Freq: Three times a day (TID) | Status: DC
  Administered 2013-10-17 – 2013-10-23 (×21): 200 mL

## 2013-10-17 NOTE — Progress Notes (Signed)
ANTIBIOTIC CONSULT NOTE - FOLLOW UP  Pharmacy Consult for Bactrim, micafungin Indication: PCP, CMV, + parainfluenza PNA, HCAP, yeast in blood cultures   Allergies  Allergen Reactions  . Penicillins Rash    Patient Measurements: Height: 5' 10"  (177.8 cm) Weight: 161 lb 6 oz (73.2 kg) IBW/kg (Calculated) : 73  Vital Signs: Temp: 98.7 F (37.1 C) (04/20 0800) Temp src: Oral (04/20 0800) BP: 117/75 mmHg (04/20 0800) Pulse Rate: 101 (04/20 0800) Intake/Output from previous day: 04/19 0701 - 04/20 0700 In: 4380.2 [I.V.:1918.9; NG/GT:605; IV Piggyback:1856.3] Out: 2175 [Urine:2175]  Labs:  Recent Labs  10/15/13 0531 10/16/13 0430 10/17/13 0620  WBC 9.8 9.3 9.8  HGB 7.8* 8.4* 7.7*  PLT 237 245 200  CREATININE 0.51 0.55 0.48*   Estimated Creatinine Clearance: 117.9 ml/min (by C-G formula based on Cr of 0.48).  Recent Labs  10/15/13 0855  VANCOTROUGH 12.7     Microbiology: Recent Results (from the past 720 hour(s))  CULTURE, BLOOD (ROUTINE X 2)     Status: None   Collection Time    10/19/2013 10:20 AM      Result Value Ref Range Status   Specimen Description BLOOD LEFT ANTECUBITAL   Final   Special Requests BOTTLES DRAWN AEROBIC AND ANAEROBIC 5 CC EACH   Final   Culture  Setup Time     Final   Value: 10/26/2013 14:52     Performed at [facility]   Culture     Final   Value: NO GROWTH 5 DAYS     Performed at [facility]   Report Status 10/07/2013 FINAL   Final  CULTURE, BLOOD (ROUTINE X 2)     Status: None   Collection Time    10/25/2013 10:35 AM      Result Value Ref Range Status   Specimen Description BLOOD RIGHT ANTECUBITAL   Final   Special Requests BOTTLES DRAWN AEROBIC AND ANAEROBIC [facility] EACH   Final   Culture  Setup Time     Final   Value: 10/12/2013 14:52     Performed at [facility]   Culture     Final   Value: NO GROWTH 5 DAYS     Performed at [facility]   Report Status 10/07/2013 FINAL   Final  PNEUMOCYSTIS  JIROVECI SMEAR BY DFA     Status: None   Collection Time    10/02/13 10:08 AM      Result Value Ref Range Status   Specimen Source-[facility] BRONCHIAL ALVEOLAR LAVAGE   Corrected   Comment: CORRECTED ON 04/05 AT 1024: PREVIOUSLY REPORTED AS BRONCHIAL WASHINGS   Pneumocystis jiroveci Ag NEGATIVE   Final   Comment: Performed at [place], RESPIRATORY (NON-EXPECTORATED)     Status: None   Collection Time    10/02/13 10:08 AM      Result Value Ref Range Status   Specimen Description BRONCHIAL ALVEOLAR LAVAGE   Final   Special Requests Immunocompromised   Final   Gram Stain     Final   Value: RARE WBC PRESENT, PREDOMINANTLY PMN     RARE SQUAMOUS EPITHELIAL CELLS PRESENT     NO ORGANISMS SEEN     Performed at [facility]   Culture     Final   Value: NO GROWTH 2 DAYS     Performed at [facility]   Report Status 10/04/2013 FINAL   Final  FUNGUS CULTURE W SMEAR  Status: None   Collection Time    10/02/13 10:08 AM      Result Value Ref Range Status   Specimen Description BRONCHIAL ALVEOLAR LAVAGE   Final   Special Requests Immunocompromised   Final   Fungal Smear     Final   Value: NO YEAST OR FUNGAL ELEMENTS SEEN     Performed at [facility]   Culture     Final   Value: CULTURE IN PROGRESS FOR FOUR WEEKS     Performed at [facility]   Report Status PENDING   Incomplete  AFB CULTURE WITH SMEAR     Status: None   Collection Time    10/02/13 10:08 AM      Result Value Ref Range Status   Specimen Description BRONCHIAL ALVEOLAR LAVAGE   Final   Special Requests Immunocompromised   Final   ACID FAST SMEAR     Final   Value: NO ACID FAST BACILLI SEEN     Performed at [facility]   Culture     Final   Value: CULTURE WILL BE EXAMINED FOR 6 WEEKS BEFORE ISSUING A FINAL REPORT     Performed at [facility]   Report Status PENDING   Incomplete  CMV CULTURE CMVC     Status: None   Collection Time    10/02/13  10:08 AM      Result Value Ref Range Status   Specimen Description BRONCHIAL ALVEOLAR LAVAGE   Final   Special Requests Immunocompromised   Final   Culture     Final   Value: No Cytomegalovirus identified in cell culture                                                                A negative result does not exclude the possibility of CMV infection;inappropriate specimen collection,storage and transport may lead to false      negative results.     Performed at [facility]   Report Status 10/05/2013 FINAL   Final  CULTURE, BLOOD (ROUTINE X 2)     Status: None   Collection Time    10/08/13 12:29 PM      Result Value Ref Range Status   Specimen Description BLOOD RIGHT ARM  2.5 ML IN [facility] BOTTLE   Final   Special Requests NONE   Final   Culture  Setup Time     Final   Value: 10/08/2013 17:24     Performed at [facility]   Culture     Final   Value: NO GROWTH 5 DAYS     Performed at [facility]   Report Status 10/14/2013 FINAL   Final  AFB CULTURE, BLOOD     Status: None   Collection Time    10/08/13 12:39 PM      Result Value Ref Range Status   Specimen Description BLOOD LEFT ARM  5 ML IN AFB BOTTLE   Final   Special Requests NONE   Final   Culture     Final   Value: CULTURE WILL BE EXAMINED FOR 6 WEEKS BEFORE ISSUING A FINAL REPORT     Performed at [facility]   Report Status PENDING   Incomplete  CULTURE,  BLOOD (ROUTINE X 2)     Status: None   Collection Time    10/08/13 12:39 PM      Result Value Ref Range Status   Specimen Description BLOOD LEFT ARM  5 ML IN [facility] BOTTLE   Final   Special Requests NONE   Final   Culture  Setup Time     Final   Value: 10/08/2013 17:24     Performed at [facility]   Culture     Final   Value: NO GROWTH 5 DAYS     Performed at [facility]   Report Status 10/14/2013 FINAL   Final  RESPIRATORY VIRUS PANEL     Status: Abnormal   Collection Time    10/08/13  4:41 PM      Result Value Ref  Range Status   Source - RVPAN NASAL SWAB   Corrected   Comment: CORRECTED ON 04/13 AT 2130: PREVIOUSLY REPORTED AS NASAL SWAB   Respiratory Syncytial Virus A NOT DETECTED   Final   Respiratory Syncytial Virus B NOT DETECTED   Final   Influenza A NOT DETECTED   Final   Influenza B NOT DETECTED   Final   Parainfluenza 1 NOT DETECTED   Final   Parainfluenza 2 DETECTED (*)  Final   Parainfluenza 3 NOT DETECTED   Final   Metapneumovirus NOT DETECTED   Final   Rhinovirus NOT DETECTED   Final   Adenovirus NOT DETECTED   Final   Influenza A H1 NOT DETECTED   Final   Influenza A H3 NOT DETECTED   Final   Comment: (NOTE)           Normal Reference Range for each Analyte: NOT DETECTED     Testing performed using the Luminex xTAG Respiratory Viral Panel test     kit.     This test was developed and its performance characteristics determined     by [facility]. It has not been cleared or approved by the [place]     Food and Drug Administration. This test is used for clinical purposes.     It should not be regarded as investigational or for research. This     laboratory is certified under the [place] (CLIA) as qualified to perform high complexity     clinical laboratory testing.     Performed at [place]     Status: None   Collection Time    10/10/13 10:45 AM      Result Value Ref Range Status   Specimen Description BRONCHIAL ALVEOLAR LAVAGE   Final   Special Requests Immunocompromised   Final   Fungal Smear     Final   Value: NO YEAST OR FUNGAL ELEMENTS SEEN     Performed at [facility]   Report Status 10/11/2013 FINAL   Final  M. TUBERCULOSIS COMPLEX BY PCR     Status: None   Collection Time    10/10/13 10:45 AM      Result Value Ref Range Status   M. tuberculosis, Direct Not detected  Not detected Final   Comment: (NOTE)     This test(s) was developed and its performance characteristics      have  been determined by [facility],      [place], [place]. Performance characteristics refer to the analytical      performance of the test.  Performed at [facility] ([facility]) BRONCHIAL ALVEOLAR LAVAGE   Final  FUNGUS CULTURE W SMEAR     Status: None   Collection Time    10/10/13 10:52 AM      Result Value Ref Range Status   Specimen Description BRONCHIAL ALVEOLAR LAVAGE   Final   Special Requests Immunocompromised   Final   Fungal Smear     Final   Value: NO YEAST OR FUNGAL ELEMENTS SEEN     Performed at [facility]   Culture     Final   Value: CANDIDA ALBICANS     Performed at [facility]   Report Status PENDING   Incomplete  CULTURE, BAL-QUANTITATIVE     Status: None   Collection Time    10/10/13 10:54 AM      Result Value Ref Range Status   Specimen Description BRONCHIAL ALVEOLAR LAVAGE   Final   Special Requests NONE   Final   Gram Stain     Final   Value: RARE WBC PRESENT,BOTH PMN AND MONONUCLEAR     NO SQUAMOUS EPITHELIAL CELLS SEEN     NO ORGANISMS SEEN     Performed at [facility]     Final   Value: 100 COLONIES/ML     Performed at [facility]   Culture     Final   Value: Non-Pathogenic Oropharyngeal-type Flora Isolated.     Performed at [facility]   Report Status 10/12/2013 FINAL   Final  CULTURE, BLOOD (ROUTINE X 2)     Status: None   Collection Time    10/13/13  9:00 AM      Result Value Ref Range Status   Specimen Description BLOOD RIGHT ARM   Final   Special Requests BOTTLES DRAWN AEROBIC AND ANAEROBIC 8CC   Final   Culture  Setup Time     Final   Value: 10/13/2013 10:06     Performed at [facility]   Culture     Final   Value:        BLOOD CULTURE RECEIVED NO GROWTH TO DATE CULTURE WILL BE HELD FOR 5 DAYS BEFORE ISSUING A FINAL NEGATIVE REPORT     Performed at [facility]   Report Status PENDING   Incomplete  CULTURE, BLOOD (ROUTINE X 2)      Status: None   Collection Time    10/13/13  9:10 AM      Result Value Ref Range Status   Specimen Description BLOOD LEFT ARM   Final   Special Requests BOTTLES DRAWN AEROBIC AND ANAEROBIC [facility]   Final   Culture  Setup Time     Final   Value: 10/13/2013 10:06     Performed at [facility]   Culture     Final   Value: YEAST     Note: Gram Stain Report Called to,Read Back By and Verified With: TZEZT WOODS 10/15/13 1232A [place]     Performed at [facility]   Report Status PENDING   Incomplete  URINE CULTURE     Status: None   Collection Time    10/13/13 12:27 PM      Result Value Ref Range Status   Specimen Description URINE, CATHETERIZED   Final   Special Requests NONE   Final   Culture  Setup Time     Final   Value: 10/13/2013 14:45  Performed at [place]     Final   Value: 5,000 COLONIES/ML     Performed at [facility]     Final   Value: INSIGNIFICANT GROWTH     Performed at [facility]   Report Status 10/14/2013 FINAL   Final  CULTURE, RESPIRATORY (NON-EXPECTORATED)     Status: None   Collection Time    10/13/13  4:04 PM      Result Value Ref Range Status   Specimen Description ENDOTRACHEAL   Final   Special Requests NONE   Final   Gram Stain     Final   Value: ABUNDANT WBC PRESENT,BOTH PMN AND MONONUCLEAR     NO SQUAMOUS EPITHELIAL CELLS SEEN     NO ORGANISMS SEEN     Performed at [facility]   Culture     Final   Value: Non-Pathogenic Oropharyngeal-type Flora Isolated.     Performed at [facility]   Report Status 10/16/2013 FINAL   Final    Anti-infectives: 4/4 >> Vanc >> 4/4 4/4 >> Cefepime >> 4/14 4/4 >> Bactrim IV >> 4/6 >> ganciclovir >> 4/14 >> Qweek fluconazole 163m >> 4/18 PTA >> Qweek azithro (MAC ppx) >> 4/16 >> Vanc >> 4/18  4/16 >> Cefepime >> 4/18 4/18 >> micafungin >>   Assessment: 89yoM admitted 4/4 with tachycardia and SOB, concerning for  ongoing/recurrent PCP pneumonia. PMH includes recent hospitalization (3/17- 09/20/13) with newly diagnosed HIV, started on ART (Stribild), and treatment for PCP pneumonia. At discharge, he was taking Bactrim, but prematurely decreased his dosage. Pharmacy initially consulted to dose Vancomycin, Cefepime, and Bactrim. ID consult recommends repeating an entire new 22 day course for PCP PNA. 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 Since then, antiinfectives narrowed to bactrim and ganciclovir, with micafungin added for fungemia.   Day # 17/22 Bactrim, D#15 Ganciclovir, D#3 Micafungin  Tmax: 102 > 98.7  WBCs: Improved to WNL  Renal: Stable, CrCl >100 ml/min   Goal of Therapy:  Appropriate abx dosing, eradication of infection.   Plan:   Continue Bactrim 452mIV q8h  Continue Micafungin 15045mV q24h  Continue Ganciclovir 385m83m q12h per MD dosing   Follow up renal function and cultures as available.  ChriGretta ArabrmD, BCPS Pager [phone]/2015 9:09 AM

## 2013-10-17 NOTE — Progress Notes (Signed)
NUTRITION FOLLOW UP  Intervention:   - D/C Prostat and Oxepa - Start TF via OGT of Vital High Protein at 58m/hr increase by 181mevery 4 hours to goal of 6015mr. This will provide 1440 calories, 126g protein, 1203 ml water. Goal rate plus calories from Propofol will provide 2348 calories and meet 101% estimated calorie needs and 101% estimated protein needs. If IVF d/c, recommend 200m90mter flushes 6 times/day.  - Will continue to monitor   Nutrition Dx:   Inadequate oral intake related to inability to eat as evidenced by NPO - ongoing   Goal:   TF regimen to meet >/= 90% of their estimated nutrition needs - met  Monitor:   Weights, labs, TF tolerance, vent status   Assessment:   47 y5 male recently diagnosed with HIV and treated empirically for PCP returned to the [facility] 4/4 with acute hypoxemic respiratory failure.   4/5 - Pt intubated 4/5. Per family, pt has lost ~15-20 lbs over the past few weeks. He is using nutritional supplements at home (Boost or Ensure.). Received consult for TF management, RD ordered Vital AF 1.2 @ 20 ml/hr via OG tube and increase by 10 ml every 4 hours to goal rate of 80 ml/hr. At goal rate, tube feeding regimen will provide 2304 kcal, 103 grams of protein, and 1549 ml of H2O. Goal rate meets 100% of estimated calorie and protein needs.   4/7 - Pt started on ARDS protocol 4/6. Per RN, pt tolerating Vital AF 1.2 at goal rate of 80ml69mwith 30ml 36mesiduals this morning and 30-40ml r44muals overnight. Remains intubated.   4/10 - Patient remains intubated.  Receiving Oxepa at 20 ml/hr plus prostat and tolerating well. Propofol: 27.5 ml/hr provides 726 calories continues  4/13 - Remains intubated. Receiving Oxepa at 30ml/hr70mh Prosat 30ml QID38mrovides 1480 calories, 105g protein. TF plus current calories from Propofol provide 2327 calories meeting 124% estimated calorie needs and 100% estimated protein needs. Paralytics added 4/10. Needed ET tube  changed today which was performed after emergency bronchoscopy. Per RN, pt tolerating TF well without any residuals.   4/17 - Remains intubated. Receiving Oxepa to 20 ml/hr as pt getting high rate of Propfol. Oxepa at 20ml/hr w2mProstat 30ml QID w27mcalories from current Propfol rate will provide 1967 calories, 90g protein, and 377ml free w46m and meet 105% estimated calorie needs and 86% estimated protein needs. Per RN, pt tolerating TF with no residuals.   4/20 - Remains intubated. Receiving Oxepa to 20 ml/hr as pt getting high rate of Propfol. Oxepa at 20ml/hr with24mstat 30ml QID with47mories from current Propfol rate will provide 1967 calories, 90g protein, and 377ml free wate69md meet 105% of previously estimated calorie needs and 86% estimated protein needs. Per conversation with RN, only 10ml of TF resi12ms this morning.   Patient is currently intubated on ventilator support MV: 16.3 L/min Temp (24hrs), Avg:99.2 F (37.3 C), Min:98.2 F (36.8 C), Max:102 F (38.9 C)  Propofol: 34.4 ml/hr - provides 908 calories   TF: Oxepa at 20ml/hr with Pro15m 30ml 5 times/day 60movides 1220 calories, 105g protein   Potassium low, getting liquid multivitamin and liquid replacement  Height: Ht Readings from Last 1 Encounters:  10/16/13 5' 10"  (1.778 m)    Weight Status:   Wt Readings from Last 1 Encounters:  10/17/13 161 lb 6 oz (73.2 kg)  Admit wt:        155 lb (70.3 kg)  Net I/Os: +5.2L  Re-estimated needs:  Kcal: 2313 Protein: 105-125g Fluid: >1.8L/day  Skin: +1 Generalized edema, +1 RUE, LUE, LLE, RLE edema, stage 2 sacral pressure ulcer on buttocks   Diet Order: NPO   Intake/Output Summary (Last 24 hours) at 10/17/13 1244 Last data filed at 10/17/13 1200  Gross per 24 hour  Intake 4148.29 ml  Output   3350 ml  Net 798.29 ml    Last BM: 4/15   Labs:   Recent Labs Lab 10/12/13 0457 10/13/13 0400 10/14/13 0345 10/15/13 0531 10/16/13 0430 10/17/13 0620   NA 128* 132* 132* 134* 133* 136*  K 3.9 3.6* 3.6* 3.8 3.8 3.4*  CL 82* 87* 89* 92* 91* 96  CO2 40* 39* 36* 34* 34* 29  BUN 25* 26* 21 19 22 19   CREATININE 0.52 0.64 0.56 0.51 0.55 0.48*  CALCIUM 8.2* 8.3* 7.6* 7.9* 7.8* 7.5*  MG 2.0 2.0 2.0  --   --   --   PHOS 3.3 4.1 4.1  --   --   --   GLUCOSE 186* 111* 132* 137* 99 128*    CBG (last 3)   Recent Labs  10/17/13 0444 10/17/13 0739 10/17/13 1120  GLUCAP 136* 119* 157*    Scheduled Meds: . antiseptic oral rinse  15 mL Mouth Rinse QID  . azithromycin  1,200 mg Oral Weekly  . chlorhexidine  15 mL Mouth Rinse BID  . collagenase   Topical Daily  . docusate  100 mg Oral BID  . dolutegravir  50 mg Oral Daily  . emtricitabine-tenofovir  1 tablet Oral Daily  . enoxaparin (LOVENOX) injection  40 mg Subcutaneous Daily  . feeding supplement (OXEPA)  1,000 mL Per Tube Q24H  . feeding supplement (PRO-STAT SUGAR FREE 64)  30 mL Per Tube 5 X Daily  . free water  200 mL Per Tube 3 times per day  . furosemide  40 mg Intravenous Q6H  . ganciclovir (CYTOVENE) IV  5 mg/kg Intravenous Q12H  . insulin aspart  0-15 Units Subcutaneous 6 times per day  . methylPREDNISolone (SOLU-MEDROL) injection  20 mg Intravenous Q12H  . micafungin (MYCAMINE) IV  150 mg Intravenous QHS  . multivitamin  5 mL Per Tube Daily  . pantoprazole sodium  40 mg Per Tube Daily  . potassium chloride  40 mEq Oral TID  . QUEtiapine  50 mg Per Tube BID  . sodium chloride  10-40 mL Intracatheter Q12H  . sulfamethoxazole-trimethoprim  450 mg Intravenous Q8H    Continuous Infusions: . HYDROmorphone 1 mg/hr (10/15/13 2000)  . midazolam (VERSED) infusion 2 mg/hr (10/17/13 0755)  . propofol 75 mcg/kg/min (10/17/13 0755)     Mikey College MS, RD, LDN [phone] Pager [phone] After Hours Pager

## 2013-10-17 NOTE — Progress Notes (Signed)
10/17/13 1200  Clinical Encounter Type  Visited With Health care provider;Patient not available (consulted with RN Aundra MilletMegan)  Visit Type Follow-up   Pt sedated and no family present, so consulted with RN, who has already been planning to page for chaplain support for Dontea's wife Harmon PierCaryn, per request from his parents.  Please do page whenever further support would be helpful:  [phone].  I will also continue to follow for spiritual/emotional support and prayer.  [address]Chaplain Leara Rawl DraytonLunde[place], [place] [phone]

## 2013-10-17 NOTE — Consult Note (Addendum)
WOC wound consult note  Reason for Consult: Moisture Associated Skin Damage to right anal region.  Wound type: MASD and pressure contributory  Pressure Ulcer POA: Yes to sacrum Measurement: 2 cm x 3 cm unable to visualize wound bed due to presence of slough. Wound bed:100% yellow adherent slough to sacrum Drainage (amount, consistency, odor) Minimal serosanguinous drainage  Periwound: Intact, slightly pink  Dressing procedure/placement/frequency: Cleanse sacral ulcer with NS and pat gently dry.  Apply Santyl ointment 1/8 inch thickness.  Top with NS moist dressing and secure with ABD pad and tape.  Change daily and PRN soilage.  Will not follow at this time. Please re-consult if needed.  Maple HudsonKaren Tikisha Molinaro RN BSN CWON  Pager [phone]

## 2013-10-17 NOTE — Progress Notes (Signed)
Name: William Bender MRN: [id number] DOB: 01/04/1966    ADMISSION DATE:  10/10/2013 CONSULTATION DATE:  09/30/2013  REFERRING MD :  Rancour  CHIEF COMPLAINT:  Shortness of breath, fever, cough  BRIEF PATIENT DESCRIPTION:  48 y/o male recently diagnosed with HIV/AIDS -CD4 =30 on 3/31 and treated empirically for PCP [facility] with acute hypoxemic respiratory failure. Note that sputum PCP was neg 3/18 but was on bactrim prior  SIGNIFICANT EVENTS: 4/09 Pneumomediastinum, nimbex added 4/10 desat, lasix started, peep improved to 10   4/13 ETT had migrated, re-advanced and verified w/ FOB. Obtained BAL at this time  4/16 Tm 101F 4/17 1 unit PRBC for Hb 6.8 4/18 Yeast in blood >> added micafungin 4/19 Tolerating pressure support  STUDIES:  4/4 CT angio > no PE, diffuse bilateral GGO and patchy consolidation worse in the bases L > R 4/9 CT chest > pneumomediastinum with subcutaneous emphysema, severe diffuse GGO b/l 4/15 RUE US> NEGATIVE 4/18 TTE > mild LVH, EF 55 to 60%  LINES / TUBES: 4/5 ETT >>4/13 (exchanged)>>> 4/5 R IJ CVL >> 4/17 4/17 Lt PICC >>   CULTURES (truncated 4/18): 4/4 CMV pcr >> POSITIVE 4/5 fungal  bal >> 4/5 afb  bal >> 4/11 resp virus panel: + parainfluenza 2 4/11 AFB blood cx >>> 4/13 fungal BAL: candida albicans>>> 4/13 CMV PCR >>> 101,862 copies/ml 4/13 fungitell >>> 4/16 Blood >>> Yeast in one of two >>> 4/16 Sputum >>>NPF 4/16 Urine >>> negative  ANTIBIOTICS: 4/4 Bactrim >>> 4/4 Vanc >>> 4/4 4/4 Cefepime >>> 4/14 4/6 Gancyclovir >>> 4/16 Vanc >>> 4/18 4/16 cefepime >>> 4/18 4/17 Micafungin >>>  SUBJECTIVE:  No sig improvement .  VITAL SIGNS: Temp:  [98.2 F (36.8 C)-102 F (38.9 C)] 98.5 F (36.9 C) (04/20 0447) Pulse Rate:  [86-146] 87 (04/20 0600) Resp:  [11-36] 20 (04/20 0600) BP: (102-165)/(47-71) 114/60 mmHg (04/20 0505) SpO2:  [87 %-99 %] 87 % (04/20 0600) FiO2 (%):  [50 %-60 %] 50 % (04/20 0505) Weight:   [73.2 kg (161 lb 6 oz)] 73.2 kg (161 lb 6 oz) (04/20 0548) VENTILATOR SETTINGS: Vent Mode:  [-] PRVC FiO2 (%):  [50 %-60 %] 50 % Set Rate:  [26 bmp] 26 bmp Vt Set:  [440 mL-580 mL] 580 mL PEEP:  [8 cmH20] 8 cmH20 Pressure Support:  [6 cmH20] 6 cmH20 Plateau Pressure:  [24 cmH20-31 cmH20] 29 cmH20 INTAKE / OUTPUT:  Intake/Output Summary (Last 24 hours) at 10/17/13 0902 Last data filed at 10/17/13 0800  Gross per 24 hour  Intake 4155.26 ml  Output   2145 ml  Net 2010.26 ml    PHYSICAL EXAMINATION: Gen: no distress HEENT: ETT in place PULM: scattered rhonchi, diffuse rales  CV: regular, tachycardic AB: soft, non tender Neuro: RASS -2 Ext: 1+ edema Derm: sacral decub noted   LABS: PULMONARY  Recent Labs Lab 10/11/13 0400  PHART 7.460*  PCO2ART 59.6*  PO2ART 87.0  HCO3 42.0*  TCO2 39.5  O2SAT 94.8    CBC  Recent Labs Lab 10/15/13 0531 10/16/13 0430 10/17/13 0620  HGB 7.8* 8.4* 7.7*  HCT 23.5* 25.7* 23.5*  WBC 9.8 9.3 9.8  PLT 237 245 200    COAGULATION  Recent Labs Lab 10/17/13 0620  INR 1.13    CHEMISTRY  Recent Labs Lab 10/11/13 0414 10/12/13 0457 10/13/13 0400 10/14/13 0345 10/15/13 0531 10/16/13 0430 10/17/13 0620  NA 131* 128* 132* 132* 134* 133* 136*  K 3.8  3.9 3.6* 3.6* 3.8 3.8 3.4*  CL 81* 82* 87* 89* 92* 91* 96  CO2 43* 40* 39* 36* 34* 34* 29  GLUCOSE 147* 186* 111* 132* 137* 99 128*  BUN 23 25* 26* 21 19 22 19   CREATININE 0.61 0.52 0.64 0.56 0.51 0.55 0.48*  CALCIUM 7.9* 8.2* 8.3* 7.6* 7.9* 7.8* 7.5*  MG 2.0 2.0 2.0 2.0  --   --   --   PHOS 3.4 3.3 4.1 4.1  --   --   --    Estimated Creatinine Clearance: 117.9 ml/min (by C-G formula based on Cr of 0.48).   LIVER  Recent Labs Lab 10/11/13 0414 10/12/13 0457 10/13/13 0400 10/17/13 0620  AST 43* 52* 42*  --   ALT 46 53 62*  --   ALKPHOS 65 64 64  --   BILITOT <0.2* <0.2* <0.2*  --   PROT 5.1* 5.0* 4.9*  --   ALBUMIN 1.7* 1.7* 1.7*  --   INR  --   --   --  1.13      INFECTIOUS  Recent Labs Lab 10/11/13 1100  LATICACIDVEN 2.4*    ENDOCRINE CBG (last 3)   Recent Labs  10/16/13 2356 10/17/13 0444 10/17/13 0739  GLUCAP 139* 136* 119*    IMAGING x48h  Dg Chest Port 1 View  10/17/2013   CLINICAL DATA:  ARDS.  EXAM: PORTABLE CHEST - 1 VIEW  COMPARISON:  DG CHEST 1V PORT dated 10/16/2013  FINDINGS: Diffuse bilateral airspace opacities appear similar to prior. No pneumothorax. Endotracheal tube, and left upper extremity PICC remain present. Enteric tube remains present. The endotracheal tube tip is 3.8 cm from the carina the cardiopericardial silhouette appears similar although lung volumes are slightly lower on today's examination.  IMPRESSION: 1. Support apparatus in good position. 2. Diffuse bilateral airspace disease compatible with ARDS. 3. Slightly lower lung volumes compared to yesterday.   Electronically Signed   By: Andreas NewportGeoffrey  Lamke M.D.   On: 10/17/2013 07:14   Dg Chest Port 1 View  10/16/2013   CLINICAL DATA:  Ventilated patient.  ARDS.  Followup study.  EXAM: PORTABLE CHEST - 1 VIEW  COMPARISON:  Chest CT 10/15/2013.  FINDINGS: Previously noted right IJ catheter has been removed. An endotracheal tube is in place with tip 2.1 cm above the carina. There is a left upper extremity PICC with tip terminating in the distal superior vena cava. A nasogastric tube is seen extending into the stomach, however, the tip of the nasogastric tube extends below the lower margin of the image. As with the prior examination there is patchy multifocal interstitial and airspace disease throughout all aspects of the lungs bilaterally, with some mild sparing of the right upper lobe apex. Overall aeration appears very similar. No definite pleural effusions. Heart size is normal. The patient is rotated to the left on today's exam, resulting in distortion of the mediastinal contours and reduced diagnostic sensitivity and specificity for mediastinal pathology.  IMPRESSION:  1. Support apparatus, as above. 2. Persistent multifocal interstitial and airspace disease throughout the lungs bilaterally with similar aeration to recent prior examinations, compatible with reported clinical history of ARDS.   Electronically Signed   By: Trudie Reedaniel  Entrikin M.D.   On: 10/16/2013 10:41  no sig improvement   ASSESSMENT / PLAN:  PULMONARY A:  Acute respiratory failure with ARDS 2nd to Parainfluenza PNA and presumed PCP  in setting of HIV. Pneumomediastinum developed 4/09 >> resolved. ETT exchanged 4/13 d/t leak. Increased Oxygen needs  4/16 >> better 4/17. >little improvement radiographically over weekend. Has positive volume balance.  P:   F/u CXR. Adjust PEEP/FiO2 to keep SpO2 > 88%. Changed solumedrol 20 mg IV q12 h on 4/18 >> using in setting of presumed PCP and immune reconstitution. Will need trach at some point >> likely this week unless shows evidence of significant improvement with diureses. Okay to wean on pressure support as tolerated when FiO2 at 50% or less, or PEEP at 8 or less. Change diuretics back to IV, as positive fluid balance.  INFECTIOUS A:   PNA in setting of HIV >> viral pneumonitis (+ parainfluenza 2) +/-bacterial, PCP. Stage II buttock ulcers. CMV viremia. His parainfluenza 2 was positive >> clinically Significant with ARDS. Fever 101F on 4/16 >> Fungemia from 4/16; TTE negative for vegetation. P:   Day 4 of micafungin. Continue bactrim (presumed PCP), ganciclovir (CMV) per ID. Continue zithromax, diflucan for prophylaxis. Continue anti-retroviral tx (truvada) per ID. Wound care consulted to assess sacral decub.  CARDIOVASCULAR A:  Severe sepsis >> hemodynamics improved. Hx of HTN. 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 Now Na trending up Hypokalemia  P:   Continue diuresis as tolerate with goal even to negative fluid balance as tolerated Monitor renal  fx, urine outpt, electrolytes Add free water  GASTROINTESTINAL A:   Protein calorie malnutrition. P:   Protonix for SUP Tube feeds to goal  HEMATOLOGIC A:   Anemia of critical illness and chronic disease >> no evidence for bleeding. RUE swelling >> doppler negative from 4/15. P:  F/u CBC Continue LMWH for DVT prevention  ENDOCRINE A:   Steroid induced hyperglycemia. P:   SSI  NEUROLOGIC A:    Anxiety, pain control, sedation >> changed to dilaudid 4/14. P:   Goal RASS -2 >> reduce sedation after trach  Updated father. Not better, not worse. Hope we can go ahead w/ trach on Wednesday   Anders Simmonds ACNP-BC [facility] Pulmonary/Critical Care Pager # [phone] OR # [phone] if no answer  Persistent and severe ARDS, failing to wean.  Will restart diureses for now, replace K, attempt to titrate O2 down as able to 40% and maintain PEEP at 8.  Anticipate trach later on this week.  CC time 35 min.  Patient seen and examined, agree with above note.  I dictated the care and orders written for this patient under my direction.  Alyson Reedy, MD [phone]

## 2013-10-17 NOTE — Progress Notes (Signed)
Patient ID: William Bender, male   DOB: [birth year], 48 y.o.   MRN: [id number]         [facility] for Infectious Disease    Date of Admission:  10/23/2013           Day 17 trimethoprim sulfamethoxazole        Day 15 ganciclovir        Day 3 micafungin Principal Problem:   Pneumonia Active Problems:   Acute respiratory failure   AIDS   PCP (pneumocystis carinii pneumonia)   CMV (cytomegalovirus)   Sacral decubitus ulcer, stage II   Hyponatremia   Pneumomediastinum   Fungemia   . antiseptic oral rinse  15 mL Mouth Rinse QID  . azithromycin  1,200 mg Oral Weekly  . chlorhexidine  15 mL Mouth Rinse BID  . collagenase   Topical Daily  . docusate  100 mg Oral BID  . dolutegravir  50 mg Oral Daily  . emtricitabine-tenofovir  1 tablet Oral Daily  . enoxaparin (LOVENOX) injection  40 mg Subcutaneous Daily  . feeding supplement (VITAL HIGH PROTEIN)  1,000 mL Per Tube Q24H  . free water  200 mL Per Tube 3 times per day  . furosemide  40 mg Intravenous Q6H  . ganciclovir (CYTOVENE) IV  5 mg/kg Intravenous Q12H  . insulin aspart  0-15 Units Subcutaneous 6 times per day  . methylPREDNISolone (SOLU-MEDROL) injection  20 mg Intravenous Q12H  . micafungin (MYCAMINE) IV  150 mg Intravenous QHS  . multivitamin  5 mL Per Tube Daily  . pantoprazole sodium  40 mg Per Tube Daily  . QUEtiapine  50 mg Per Tube BID  . sodium chloride  10-40 mL Intracatheter Q12H  . sulfamethoxazole-trimethoprim  450 mg Intravenous Q8H    Objective: Temp:  [98.5 F (36.9 C)-102 F (38.9 C)] 98.7 F (37.1 C) (04/20 1200) Pulse Rate:  [86-146] 93 (04/20 1600) Resp:  [11-36] 34 (04/20 1600) BP: (102-165)/(53-75) 110/65 mmHg (04/20 1600) SpO2:  [85 %-99 %] 92 % (04/20 1600) FiO2 (%):  [50 %-60 %] 60 % (04/20 1200) Weight:  [73.2 kg (161 lb 6 oz)] 73.2 kg (161 lb 6 oz) (04/20 0548) I/O: +2500 ccs yesterday  General: Sedated on the ventilator Skin: WOC Notations  "Measurement: 2 cm x 3 cm unable to  visualize wound bed due to presence of slough.  Wound bed:100% yellow adherent slough to sacrum  Drainage (amount, consistency, odor) Minimal serosanguinous drainage  Periwound: Intact, slightly pink  Lungs: Clear in nature Cor: Regular S1-S2 no murmurs Abdomen: Soft. No diarrhea.  Lab Results Lab Results  Component Value Date   WBC 9.8 10/17/2013   HGB 7.7* 10/17/2013   HCT 23.5* 10/17/2013   MCV 90.0 10/17/2013   PLT 200 10/17/2013    Lab Results  Component Value Date   CREATININE 0.48* 10/17/2013   BUN 19 10/17/2013   NA 136* 10/17/2013   K 3.4* 10/17/2013   CL 96 10/17/2013   CO2 29 10/17/2013    Lab Results  Component Value Date   ALT 62* 10/13/2013   AST 42* 10/13/2013   ALKPHOS 64 10/13/2013   BILITOT <0.2* 10/13/2013      Microbiology: Recent Results (from the past 240 hour(s))  CULTURE, BLOOD (ROUTINE X 2)     Status: None   Collection Time    10/08/13 12:29 PM      Result Value Ref Range Status   Specimen Description BLOOD RIGHT ARM  2.5 ML IN [facility]  BOTTLE   Final   Special Requests NONE   Final   Culture  Setup Time     Final   Value: 10/08/2013 17:24     Performed at [facility]   Culture     Final   Value: NO GROWTH 5 DAYS     Performed at [facility]   Report Status 10/14/2013 FINAL   Final  AFB CULTURE, BLOOD     Status: None   Collection Time    10/08/13 12:39 PM      Result Value Ref Range Status   Specimen Description BLOOD LEFT ARM  5 ML IN [facility] BOTTLE   Final   Special Requests NONE   Final   Culture     Final   Value: CULTURE WILL BE EXAMINED FOR 6 WEEKS BEFORE ISSUING A FINAL REPORT     Performed at [facility]   Report Status PENDING   Incomplete  CULTURE, BLOOD (ROUTINE X 2)     Status: None   Collection Time    10/08/13 12:39 PM      Result Value Ref Range Status   Specimen Description BLOOD LEFT ARM  5 ML IN [facility] BOTTLE   Final   Special Requests NONE   Final   Culture  Setup Time     Final   Value: 10/08/2013 17:24      Performed at [facility]   Culture     Final   Value: NO GROWTH 5 DAYS     Performed at [facility]   Report Status 10/14/2013 FINAL   Final  RESPIRATORY VIRUS PANEL     Status: Abnormal   Collection Time    10/08/13  4:41 PM      Result Value Ref Range Status   Source - RVPAN NASAL SWAB   Corrected   Comment: CORRECTED ON 04/13 AT 2130: PREVIOUSLY REPORTED AS NASAL SWAB   Respiratory Syncytial Virus A NOT DETECTED   Final   Respiratory Syncytial Virus B NOT DETECTED   Final   Influenza A NOT DETECTED   Final   Influenza B NOT DETECTED   Final   Parainfluenza 1 NOT DETECTED   Final   Parainfluenza 2 DETECTED (*)  Final   Parainfluenza 3 NOT DETECTED   Final   Metapneumovirus NOT DETECTED   Final   Rhinovirus NOT DETECTED   Final   Adenovirus NOT DETECTED   Final   Influenza A H1 NOT DETECTED   Final   Influenza A H3 NOT DETECTED   Final   Comment: (NOTE)           Normal Reference Range for each Analyte: NOT DETECTED     Testing performed using the Luminex xTAG Respiratory Viral Panel test     kit.     This test was developed and its performance characteristics determined     by [facility]. It has not been cleared or approved by the [place]     Food and Drug Administration. This test is used for clinical purposes.     It should not be regarded as investigational or for research. This     laboratory is certified under the [place] (CLIA) as qualified to perform high complexity     clinical laboratory testing.     Performed at [place]     Status: None   Collection Time  10/10/13 10:45 AM      Result Value Ref Range Status   Specimen Description BRONCHIAL ALVEOLAR LAVAGE   Final   Special Requests Immunocompromised   Final   Fungal Smear     Final   Value: NO YEAST OR FUNGAL ELEMENTS SEEN     Performed at [facility]   Report Status 10/11/2013 FINAL   Final  M.  TUBERCULOSIS COMPLEX BY PCR     Status: None   Collection Time    10/10/13 10:45 AM      Result Value Ref Range Status   M. tuberculosis, Direct Not detected  Not detected Final   Comment: (NOTE)     This test(s) was developed and its performance characteristics      have been determined by [employer],      [place], [place]. Performance characteristics refer to the analytical      performance of the test.     Performed at [place] ([facility]) BRONCHIAL ALVEOLAR LAVAGE   Final  FUNGUS CULTURE W SMEAR     Status: None   Collection Time    10/10/13 10:52 AM      Result Value Ref Range Status   Specimen Description BRONCHIAL ALVEOLAR LAVAGE   Final   Special Requests Immunocompromised   Final   Fungal Smear     Final   Value: NO YEAST OR FUNGAL ELEMENTS SEEN     Performed at [facility]   Culture     Final   Value: CANDIDA ALBICANS     Performed at [facility]   Report Status PENDING   Incomplete  CULTURE, BAL-QUANTITATIVE     Status: None   Collection Time    10/10/13 10:54 AM      Result Value Ref Range Status   Specimen Description BRONCHIAL ALVEOLAR LAVAGE   Final   Special Requests NONE   Final   Gram Stain     Final   Value: RARE WBC PRESENT,BOTH PMN AND MONONUCLEAR     NO SQUAMOUS EPITHELIAL CELLS SEEN     NO ORGANISMS SEEN     Performed at [facility]     Final   Value: 100 COLONIES/ML     Performed at [facility]   Culture     Final   Value: Non-Pathogenic Oropharyngeal-type Flora Isolated.     Performed at [facility]   Report Status 10/12/2013 FINAL   Final  CULTURE, BLOOD (ROUTINE X 2)     Status: None   Collection Time    10/13/13  9:00 AM      Result Value Ref Range Status   Specimen Description BLOOD RIGHT ARM   Final   Special Requests BOTTLES DRAWN AEROBIC AND ANAEROBIC 8CC   Final   Culture  Setup Time     Final   Value: 10/13/2013 10:06     Performed at  [facility]   Culture     Final   Value:        BLOOD CULTURE RECEIVED NO GROWTH TO DATE CULTURE WILL BE HELD FOR 5 DAYS BEFORE ISSUING A FINAL NEGATIVE REPORT     Performed at [facility]   Report Status PENDING   Incomplete  CULTURE, BLOOD (ROUTINE X 2)     Status: None   Collection Time    10/13/13  9:10 AM      Result Value  Ref Range Status   Specimen Description BLOOD LEFT ARM   Final   Special Requests BOTTLES DRAWN AEROBIC AND ANAEROBIC [facility]   Final   Culture  Setup Time     Final   Value: 10/13/2013 10:06     Performed at [facility]   Culture     Final   Value: CANDIDA ALBICANS     Note: Gram Stain Report Called to,Read Back By and Verified With: TZEZT WOODS 10/15/13 1232A [place]     Performed at [facility]   Report Status 10/17/2013 FINAL   Final  URINE CULTURE     Status: None   Collection Time    10/13/13 12:27 PM      Result Value Ref Range Status   Specimen Description URINE, CATHETERIZED   Final   Special Requests NONE   Final   Culture  Setup Time     Final   Value: 10/13/2013 14:45     Performed at [facility]     Final   Value: 5,000 COLONIES/ML     Performed at [facility]   Culture     Final   Value: INSIGNIFICANT GROWTH     Performed at [facility]   Report Status 10/14/2013 FINAL   Final  CULTURE, RESPIRATORY (NON-EXPECTORATED)     Status: None   Collection Time    10/13/13  4:04 PM      Result Value Ref Range Status   Specimen Description ENDOTRACHEAL   Final   Special Requests NONE   Final   Gram Stain     Final   Value: ABUNDANT WBC PRESENT,BOTH PMN AND MONONUCLEAR     NO SQUAMOUS EPITHELIAL CELLS SEEN     NO ORGANISMS SEEN     Performed at [facility]   Culture     Final   Value: Non-Pathogenic Oropharyngeal-type Flora Isolated.     Performed at [facility]   Report Status 10/16/2013 FINAL   Final    Studies/Results: Dg Chest Port 1  View  10/17/2013   CLINICAL DATA:  ARDS.  EXAM: PORTABLE CHEST - 1 VIEW  COMPARISON:  DG CHEST 1V PORT dated 10/16/2013  FINDINGS: Diffuse bilateral airspace opacities appear similar to prior. No pneumothorax. Endotracheal tube, and left upper extremity PICC remain present. Enteric tube remains present. The endotracheal tube tip is 3.8 cm from the carina the cardiopericardial silhouette appears similar although lung volumes are slightly lower on today's examination.  IMPRESSION: 1. Support apparatus in good position. 2. Diffuse bilateral airspace disease compatible with ARDS. 3. Slightly lower lung volumes compared to yesterday.   Electronically Signed   By: Dereck Ligas M.D.   On: 10/17/2013 07:14   Dg Chest Port 1 View  10/16/2013   CLINICAL DATA:  Ventilated patient.  ARDS.  Followup study.  EXAM: PORTABLE CHEST - 1 VIEW  COMPARISON:  Chest CT 10/15/2013.  FINDINGS: Previously noted right IJ catheter has been removed. An endotracheal tube is in place with tip 2.1 cm above the carina. There is a left upper extremity PICC with tip terminating in the distal superior vena cava. A nasogastric tube is seen extending into the stomach, however, the tip of the nasogastric tube extends below the lower margin of the image. As with the prior examination there is patchy multifocal interstitial and airspace disease throughout all aspects of the lungs bilaterally, with some mild sparing of the right upper lobe apex.  Overall aeration appears very similar. No definite pleural effusions. Heart size is normal. The patient is rotated to the left on today's exam, resulting in distortion of the mediastinal contours and reduced diagnostic sensitivity and specificity for mediastinal pathology.  IMPRESSION: 1. Support apparatus, as above. 2. Persistent multifocal interstitial and airspace disease throughout the lungs bilaterally with similar aeration to recent prior examinations, compatible with reported clinical history of ARDS.    Electronically Signed   By: Vinnie Langton M.D.   On: 10/16/2013 10:41    Assessment: He continues to have intermittent fevers. I will repeat cultures today and continue current antimicrobial therapy. I agree with the gradual weaning of steroids and diuresis.  Plan: 1. Continue current antimicrobial therapy 2. Repeat blood, urine and sputum cultures  Michel Bickers, MD [facility] for Infectious [place] [phone] pager   [phone] cell 10/17/2013, 4:28 PM

## 2013-10-17 NOTE — Progress Notes (Signed)
CARE MANAGEMENT NOTE 10/17/2013  Patient:  William Bender,William Bender   Account Number:  [id number]  Date Initiated:  10/03/2013  Documentation initiated by:  DAVIS,RHONDA  Subjective/Objective Assessment:   pt admitted on 10/14/2013/required [id number]/hcap and failed outpt treatment moved to icu on [id number].     Action/Plan:   tbd, will follow and see what needs are present once extubated and awake.   Anticipated DC Date:  10/20/2013   Anticipated DC Plan:  SKILLED NURSING FACILITY  In-house referral  NA      DC Planning Services  NA      [facility] Choice  NA   Choice offered to / List presented to:  NA   DME arranged  NA      DME agency  NA     HH arranged  NA      HH agency  NA   Status of service:  In process, will continue to follow Medicare Important Message given?  NA - LOS <3 / Initial given by admissions (If response is "NO", the following Medicare IM given date fields will be blank) Date Medicare IM given:   Date Additional Medicare IM given:    Discharge Disposition:    Per UR Regulation:  Reviewed for med. necessity/level of care/duration of stay  If discussed at Long Length of Stay Meetings, dates discussed:   10/06/2013  10/11/2013  10/13/2013    Comments:  [id number]/Rhonda Earlene Plateravis, RN, BSN, CCM, [phone] Chart reviewed for update of needs and condition./ remains on full vent support, and sedated due to agitiation/poss conversion to trach later this week, failed weaning attempts/pt will need ltach palcement once trached and has failed three attempts to wean/  [id number]/Rhonda Earlene Plateravis, RN, BSN, [place] [phone] Chart Reviewed for discharge and hospital needs. remains vent dependant/ards,multiple critical medical problems. Discharge needs at time of review: None present will follow for needs. Review of patient progress due on [id number].   [id number]/Rhonda Earlene PlaterDavis, RN, BSN, [place] [phone] Chart Reviewed for discharge and hospital needs. Discharge needs at  time of review: None present will follow for needs. Review of patient progress due on [id number].   [id number]/Rhonda Earlene PlaterDavis, RN, BSN, [place] [phone] Chart Reviewed for discharge and hospital needs. Discharge needs at time of review: None present will follow for needs. Review of patient progress due on [id number]. vent day4   [id number]/Rhonda Earlene PlaterDavis, RN, BSN, [place] [phone] Chart Reviewed for discharge and hospital needs. Discharge needs at time of review: None present will follow for needs. Review of patient progress due on [id number].

## 2013-10-18 ENCOUNTER — Ambulatory Visit: Payer: BC Managed Care – PPO | Admitting: Infectious Diseases

## 2013-10-18 ENCOUNTER — Inpatient Hospital Stay (HOSPITAL_COMMUNITY): Payer: BC Managed Care – PPO

## 2013-10-18 ENCOUNTER — Telehealth: Payer: Self-pay | Admitting: Licensed Clinical Social Worker

## 2013-10-18 LAB — COMPREHENSIVE METABOLIC PANEL
ALBUMIN: 1.2 g/dL — AB (ref 3.5–5.2)
ALT: 34 U/L (ref 0–53)
AST: 35 U/L (ref 0–37)
Alkaline Phosphatase: 101 U/L (ref 39–117)
BUN: 18 mg/dL (ref 6–23)
CO2: 27 mEq/L (ref 19–32)
CREATININE: 0.42 mg/dL — AB (ref 0.50–1.35)
Calcium: 7.1 mg/dL — ABNORMAL LOW (ref 8.4–10.5)
Chloride: 89 mEq/L — ABNORMAL LOW (ref 96–112)
GFR calc non Af Amer: >90 mL/min (ref >90)
GLUCOSE: 133 mg/dL — AB (ref 70–99)
Potassium: 3.6 mEq/L — ABNORMAL LOW (ref 3.7–5.3)
Sodium: 129 mEq/L — ABNORMAL LOW (ref 137–147)
Total Protein: 4.4 g/dL — ABNORMAL LOW (ref 6.0–8.3)

## 2013-10-18 LAB — PHOSPHORUS: PHOSPHORUS: 3.5 mg/dL (ref 2.3–4.6)

## 2013-10-18 LAB — BLOOD GAS, ARTERIAL
Acid-Base Excess: 7 mmol/L — ABNORMAL HIGH (ref 0.0–2.0)
BICARBONATE: 31.3 meq/L — AB (ref 20.0–24.0)
Drawn by: 23532
FIO2: 0.55 %
MECHVT: 580 mL
O2 Saturation: 85.8 %
PATIENT TEMPERATURE: 99.1
PEEP/CPAP: 8 cmH2O
PO2 ART: 55.5 mmHg — AB (ref 80.0–100.0)
RATE: 26 resp/min
TCO2: 29.8 mmol/L (ref 0–100)
pCO2 arterial: 46.2 mmHg — ABNORMAL HIGH (ref 35.0–45.0)
pH, Arterial: 7.447 (ref 7.350–7.450)

## 2013-10-18 LAB — GLUCOSE, CAPILLARY
GLUCOSE-CAPILLARY: 86 mg/dL (ref 70–99)
GLUCOSE-CAPILLARY: 105 mg/dL — AB (ref 70–99)
Glucose-Capillary: 115 mg/dL — ABNORMAL HIGH (ref 70–99)
Glucose-Capillary: 124 mg/dL — ABNORMAL HIGH (ref 70–99)
Glucose-Capillary: 139 mg/dL — ABNORMAL HIGH (ref 70–99)

## 2013-10-18 LAB — URINE CULTURE
COLONY COUNT: NO GROWTH
CULTURE: NO GROWTH
Special Requests: NORMAL

## 2013-10-18 LAB — CBC
HEMATOCRIT: 21 % — AB (ref 39.0–52.0)
HEMOGLOBIN: 7.7 g/dL — AB (ref 13.0–17.0)
MCH: 32.9 pg (ref 26.0–34.0)
MCHC: 36.7 g/dL — AB (ref 30.0–36.0)
MCV: 89.7 fL (ref 78.0–100.0)
Platelets: 195 10*3/uL (ref 150–400)
RBC: 2.34 MIL/uL — ABNORMAL LOW (ref 4.22–5.81)
RDW: 16.9 % — ABNORMAL HIGH (ref 11.5–15.5)
WBC: 8.1 10*3/uL (ref 4.0–10.5)

## 2013-10-18 LAB — MAGNESIUM: MAGNESIUM: 1.6 mg/dL (ref 1.5–2.5)

## 2013-10-18 LAB — TRIGLYCERIDES: Triglycerides: 1463 mg/dL — ABNORMAL HIGH (ref <150)

## 2013-10-18 MED ORDER — OXEPA PO LIQD
1000.0000 mL | ORAL | Status: DC
  Administered 2013-10-18 – 2013-10-20 (×3): 1000 mL
  Filled 2013-10-18 (×5): qty 1000

## 2013-10-18 MED ORDER — METOPROLOL TARTRATE 1 MG/ML IV SOLN
2.5000 mg | INTRAVENOUS | Status: DC | PRN
  Administered 2013-10-18 – 2013-10-19 (×2): 2.5 mg via INTRAVENOUS
  Administered 2013-10-21 (×2): 5 mg via INTRAVENOUS
  Filled 2013-10-18 (×5): qty 5

## 2013-10-18 MED ORDER — POTASSIUM CHLORIDE 20 MEQ/15ML (10%) PO LIQD
40.0000 meq | Freq: Three times a day (TID) | ORAL | Status: AC
  Administered 2013-10-18 (×2): 40 meq
  Filled 2013-10-18 (×2): qty 30

## 2013-10-18 MED ORDER — POTASSIUM CHLORIDE 20 MEQ/15ML (10%) PO LIQD
20.0000 meq | ORAL | Status: DC
  Administered 2013-10-18: 20 meq
  Filled 2013-10-18 (×2): qty 15

## 2013-10-18 MED ORDER — METOLAZONE 5 MG PO TABS
5.0000 mg | ORAL_TABLET | Freq: Every day | ORAL | Status: AC
  Administered 2013-10-18: 5 mg via ORAL
  Filled 2013-10-18: qty 1

## 2013-10-18 MED ORDER — MAGNESIUM SULFATE 40 MG/ML IJ SOLN: 2.0000 g | Freq: Once | INTRAMUSCULAR | Status: DC

## 2013-10-18 MED ORDER — SULFAMETHOXAZOLE-TMP DS 800-160 MG PO TABS
1.0000 | ORAL_TABLET | Freq: Every day | ORAL | Status: DC
Start: 2013-10-18 — End: 2013-11-06
  Administered 2013-10-19 – 2013-11-05 (×18): 1
  Filled 2013-10-18 (×21): qty 1

## 2013-10-18 MED ORDER — FUROSEMIDE 10 MG/ML IJ SOLN
8.0000 mg/h | INTRAVENOUS | Status: AC
  Administered 2013-10-18: 8 mg/h via INTRAVENOUS
  Filled 2013-10-18: qty 25

## 2013-10-18 MED ORDER — PRO-STAT SUGAR FREE PO LIQD
30.0000 mL | Freq: Every day | ORAL | Status: DC
  Administered 2013-10-18 – 2013-10-20 (×3): 30 mL
  Filled 2013-10-18 (×3): qty 30

## 2013-10-18 MED ORDER — MAGNESIUM SULFATE 40 MG/ML IJ SOLN
2.0000 g | Freq: Once | INTRAMUSCULAR | Status: AC
  Administered 2013-10-18: 2 g via INTRAVENOUS
  Filled 2013-10-18: qty 50

## 2013-10-18 NOTE — Progress Notes (Signed)
[facility] ADULT ICU REPLACEMENT PROTOCOL FOR AM LAB REPLACEMENT ONLY  The patient does apply for the [facility] Adult ICU Electrolyte Replacment Protocol based on the criteria listed below:   1. Is GFR >/= 40 ml/min? yes  Patient's GFR today is >90 2. Is urine output >/= 0.5 ml/kg/hr for the last 6 hours? yes Patient's UOP is 4.3 ml/kg/hr 3. Is BUN < 60 mg/dL? yes  Patient's BUN today is 18 4. Abnormal electrolyte(s):K 3.6, Mg 1.6 5. Ordered repletion with: per protocol 6. If a panic level lab has been reported, has the CCM MD in charge been notified? no.   Physician:    Darrick GrinderGretchin A Raelene Trew 10/18/2013 6:51 AM

## 2013-10-18 NOTE — Progress Notes (Signed)
NUTRITION FOLLOW UP  Intervention:   - D/C Vital High Protein - Start TF via OGT of Oxepa at 60m/hr increase by 165mevery 4 hours to goal of 6088mr. Will order Prostat 51m67mily via OGT. Goal rate of TF plus Prostat 51ml16mly will provide 2260 calories, 105g protein, 1151ml 16m water and meet 107% estimated calorie needs and 100% estimated protein needs. Pt getting 200ml f20mwater via OGT 3 times/day for total fluid intake of 1751ml wa4m Recommend changing free water flushes to 200ml 6 t14m/day for total fluid intake of 2351ml.  - 78m continue to monitor   Nutrition Dx:   Inadequate oral intake related to inability to eat as evidenced by NPO - ongoing   Goal:   TF regimen to meet >/= 90% of their estimated nutrition needs - not met but will meet with TF formula change and once goal rate reached   Monitor:   Weights, labs, TF tolerance, vent status   Assessment:   48 y/o mal48recently diagnosed with HIV and treated empirically for PCP returned to the [facility] ED on [facility] with acute hypoxemic respiratory failure.   4/5 - Pt intubated 4/5. Per family, pt has lost ~15-20 lbs over the past few weeks. He is using nutritional supplements at home (Boost or Ensure.). Received consult for TF management, RD ordered Vital AF 1.2 @ 20 ml/hr via OG tube and increase by 10 ml every 4 hours to goal rate of 80 ml/hr. At goal rate, tube feeding regimen will provide 2304 kcal, 103 grams of protein, and 1549 ml of H2O. Goal rate meets 100% of estimated calorie and protein needs.   4/7 - Pt started on ARDS protocol 4/6. Per RN, pt tolerating Vital AF 1.2 at goal rate of 80ml/hr wi3m0ml of res73mls this morning and 30-40ml residua60mvernight. Remains intubated.   4/10 - Patient remains intubated.  Receiving Oxepa at 20 ml/hr plus prostat and tolerating well. Propofol: 27.5 ml/hr provides 726 calories continues  4/13 - Remains intubated. Receiving Oxepa at 51ml/hr with 41mat 51ml QID - pro52ms 1480  calories, 105g protein. TF plus current calories from Propofol provide 2327 calories meeting 124% estimated calorie needs and 100% estimated protein needs. Paralytics added 4/10. Needed ET tube changed today which was performed after emergency bronchoscopy. Per RN, pt tolerating TF well without any residuals.   4/17 - Remains intubated. Receiving Oxepa to 20 ml/hr as pt getting high rate of Propfol. Oxepa at 20ml/hr with Pr55mt 51ml QID with ca28mes from current Propfol rate will provide 1967 calories, 90g protein, and 377ml free water a79meet 105% estimated calorie needs and 86% estimated protein needs. Per RN, pt tolerating TF with no residuals.   4/20 - Remains intubated. Receiving Oxepa to 20 ml/hr as pt getting high rate of Propfol. Oxepa at 20ml/hr with Prost73m0ml QID with calor18mfrom current Propfol rate will provide 1967 calories, 90g protein, and 377ml free water and 48m 105% of previously estimated calorie needs and 86% estimated protein needs. Per conversation with RN, only 10ml of TF residuals 60m morning. D/C Prostat and Oxepa. Start TF via OGT of Vital High Protein at 20ml/hr increase by 1042mvery 4 hours to g62mof 60ml/hr. This will provi67m440 calories, 126g protein, 1203 ml water. Goal rate plus calories from Propofol will provide 2348 calories and meet 101% estimated calorie needs and 101% estimated protein needs. If IVF d/c, recommend 200ml water flushes 6 time56my. Changed TF rate as pt getting so little  of Oxepa for TF to be beneficial.   4/21 - Remains intubated. Receiving Vital High Protein at 46m/hr which provides 1200 calories, 105g protein, and 10030mfree water which meets 52% of previously estimated calorie needs and 100% estimated protein needs. Continues to have severe ARDS per RN. 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 RN reports pt tolerating TF well without any residuals. Will change TF back to Oxepa for pt to get specialized  nutrition now that pt no longer on Propofol.   Patient is currently intubated on ventilator support MV: 17.7 L/min Temp (24hrs), Avg:99.1 F (37.3 C), Min:98.4 F (36.9 C), Max:99.4 F (37.4 C)  Propofol: off  TF: Vital High Protein at 5052mr   Potassium remains low, getting liquid multivitamin and liquid potassium replacement   Height: Ht Readings from Last 1 Encounters:  10/16/13 5' 10"  (1.778 m)    Weight Status:   Wt Readings from Last 1 Encounters:  10/18/13 153 lb 7 oz (69.6 kg)  Admit wt:        155 lb (70.3 kg) Net I/Os: +5.1L  Re-estimated needs:  Kcal: 2106 Protein: 105-125g Fluid: >1.8L/day  Skin: +1 Generalized edema, non-pitting RUE, LUE, LLE, RLE edema, stage 2 sacral pressure ulcer on buttocks    Diet Order: NPO   Intake/Output Summary (Last 24 hours) at 10/18/13 0935 Last data filed at 10/18/13 0900  Gross per 24 hour  Intake 4181.63 ml  Output   4915 ml  Net -733.37 ml    Last BM: 4/21   Labs:   Recent Labs Lab 10/13/13 0400 10/14/13 0345  10/16/13 0430 10/17/13 0620 10/18/13 0530  NA 132* 132*  < > 133* 136* 129*  K 3.6* 3.6*  < > 3.8 3.4* 3.6*  CL 87* 89*  < > 91* 96 89*  CO2 39* 36*  < > 34* 29 27  BUN 26* 21  < > 22 19 18   CREATININE 0.64 0.56  < > 0.55 0.48* 0.42*  CALCIUM 8.3* 7.6*  < > 7.8* 7.5* 7.1*  MG 2.0 2.0  --   --   --  1.6  PHOS 4.1 4.1  --   --   --  3.5  GLUCOSE 111* 132*  < > 99 128* 133*  < > = values in this interval not displayed.  CBG (last 3)   Recent Labs  10/17/13 2309 10/18/13 0352 10/18/13 0801  GLUCAP 134* 115* 124*    Scheduled Meds: . antiseptic oral rinse  15 mL Mouth Rinse QID  . azithromycin  1,200 mg Oral Weekly  . chlorhexidine  15 mL Mouth Rinse BID  . collagenase   Topical Daily  . docusate  100 mg Oral BID  . dolutegravir  50 mg Oral Daily  . emtricitabine-tenofovir  1 tablet Oral Daily  . enoxaparin (LOVENOX) injection  40 mg Subcutaneous Daily  . feeding supplement (VITAL  HIGH PROTEIN)  1,000 mL Per Tube Q24H  . free water  200 mL Per Tube 3 times per day  . ganciclovir (CYTOVENE) IV  5 mg/kg Intravenous Q12H  . insulin aspart  0-15 Units Subcutaneous 6 times per day  . methylPREDNISolone (SOLU-MEDROL) injection  20 mg Intravenous Q12H  . metolazone  5 mg Oral Daily  . micafungin (MYCAMINE) IV  150 mg Intravenous QHS  . multivitamin  5 mL Per Tube Daily  . pantoprazole sodium  40 mg Per Tube Daily  . potassium chloride  40 mEq Per Tube  TID PC & HS  . QUEtiapine  50 mg Per Tube BID  . sodium chloride  10-40 mL Intracatheter Q12H  . sulfamethoxazole-trimethoprim  450 mg Intravenous Q8H    Continuous Infusions: . furosemide (LASIX) infusion    . HYDROmorphone 1 mg/hr (10/17/13 1425)  . midazolam (VERSED) infusion 5 mg/hr (10/18/13 0830)     Mikey College MS, [place], [place] Pager [phone] After Hours Pager

## 2013-10-18 NOTE — Progress Notes (Signed)
[id number]/Jerika Wales Earlene Plateravis, RN, BSN, CCM, [phone] Chart reviewed for update of needs and condition./ remainas on vent peep increased to 12 and fi02 at 80%.  Sedated, ards, hiv-pnuemonitis

## 2013-10-18 NOTE — Progress Notes (Signed)
Patient ID: William Bender, male   DOB: 02/14/1966, 48 y.o.   MRN: [id number]         [facility] for Infectious Disease    Date of Admission:  10/12/2013           Day 18 trimethoprim sulfamethoxazole        Day 16 ganciclovir        Day 4 micafungin Principal Problem:   Pneumonia Active Problems:   Acute respiratory failure   AIDS   PCP (pneumocystis carinii pneumonia)   CMV (cytomegalovirus)   Sacral decubitus ulcer, stage II   Hyponatremia   Pneumomediastinum   Fungemia   . antiseptic oral rinse  15 mL Mouth Rinse QID  . azithromycin  1,200 mg Oral Weekly  . chlorhexidine  15 mL Mouth Rinse BID  . collagenase   Topical Daily  . docusate  100 mg Oral BID  . dolutegravir  50 mg Oral Daily  . emtricitabine-tenofovir  1 tablet Oral Daily  . enoxaparin (LOVENOX) injection  40 mg Subcutaneous Daily  . feeding supplement (PRO-STAT SUGAR FREE 64)  30 mL Per Tube Daily  . free water  200 mL Per Tube 3 times per day  . ganciclovir (CYTOVENE) IV  5 mg/kg Intravenous Q12H  . insulin aspart  0-15 Units Subcutaneous 6 times per day  . methylPREDNISolone (SOLU-MEDROL) injection  20 mg Intravenous Q12H  . micafungin (MYCAMINE) IV  150 mg Intravenous QHS  . multivitamin  5 mL Per Tube Daily  . pantoprazole sodium  40 mg Per Tube Daily  . potassium chloride  40 mEq Per Tube TID PC & HS  . QUEtiapine  50 mg Per Tube BID  . sodium chloride  10-40 mL Intracatheter Q12H  . sulfamethoxazole-trimethoprim  450 mg Intravenous Q8H    Objective: Temp:  [98.4 F (36.9 C)-99.6 F (37.6 C)] 99.6 F (37.6 C) (04/21 1200) Pulse Rate:  [73-121] 107 (04/21 1500) Resp:  [16-34] 18 (04/21 1500) BP: (96-161)/(42-79) 118/60 mmHg (04/21 1500) SpO2:  [85 %-99 %] 93 % (04/21 1500) FiO2 (%):  [55 %-80 %] 80 % (04/21 1200) Weight:  [153 lb 7 oz (69.6 kg)] 153 lb 7 oz (69.6 kg) (04/21 0500)   General: Sedated on the ventilator. He opens eyes to voice Lungs: Clear Cor: Regular S1-S2 no  murmurs Abdomen: Soft.   Lab Results Lab Results  Component Value Date   WBC 8.1 10/18/2013   HGB 7.7* 10/18/2013   HCT 21.0* 10/18/2013   MCV 89.7 10/18/2013   PLT 195 10/18/2013    Lab Results  Component Value Date   CREATININE 0.42* 10/18/2013   BUN 18 10/18/2013   NA 129* 10/18/2013   K 3.6* 10/18/2013   CL 89* 10/18/2013   CO2 27 10/18/2013    Lab Results  Component Value Date   ALT 34 10/18/2013   AST 35 10/18/2013   ALKPHOS 101 10/18/2013   BILITOT <0.2* 10/18/2013      Microbiology: Recent Results (from the past 240 hour(s))  RESPIRATORY VIRUS PANEL     Status: Abnormal   Collection Time    10/08/13  4:41 PM      Result Value Ref Range Status   Source - RVPAN NASAL SWAB   Corrected   Comment: CORRECTED ON 04/13 AT 2130: PREVIOUSLY REPORTED AS NASAL SWAB   Respiratory Syncytial Virus A NOT DETECTED   Final   Respiratory Syncytial Virus B NOT DETECTED   Final   Influenza A NOT  DETECTED   Final   Influenza B NOT DETECTED   Final   Parainfluenza 1 NOT DETECTED   Final   Parainfluenza 2 DETECTED (*)  Final   Parainfluenza 3 NOT DETECTED   Final   Metapneumovirus NOT DETECTED   Final   Rhinovirus NOT DETECTED   Final   Adenovirus NOT DETECTED   Final   Influenza A H1 NOT DETECTED   Final   Influenza A H3 NOT DETECTED   Final   Comment: (NOTE)           Normal Reference Range for each Analyte: NOT DETECTED     Testing performed using the Luminex xTAG Respiratory Viral Panel test     kit.     This test was developed and its performance characteristics determined     by [facility]. It has not been cleared or approved by the [place]     Food and Drug Administration. This test is used for clinical purposes.     It should not be regarded as investigational or for research. This     laboratory is certified under the [place] (CLIA) as qualified to perform high complexity     clinical laboratory testing.     Performed at  [facility]     Status: None   Collection Time    10/10/13 10:45 AM      Result Value Ref Range Status   Specimen Description BRONCHIAL ALVEOLAR LAVAGE   Final   Special Requests Immunocompromised   Final   Fungal Smear     Final   Value: NO YEAST OR FUNGAL ELEMENTS SEEN     Performed at [facility]   Report Status 10/11/2013 FINAL   Final  M. TUBERCULOSIS COMPLEX BY PCR     Status: None   Collection Time    10/10/13 10:45 AM      Result Value Ref Range Status   M. tuberculosis, Direct Not detected  Not detected Final   Comment: (NOTE)     This test(s) was developed and its performance characteristics      have been determined by [employer],      [place], [place]. Performance characteristics refer to the analytical      performance of the test.     Performed at [facility] ([facility]) BRONCHIAL ALVEOLAR LAVAGE   Final  FUNGUS CULTURE W SMEAR     Status: None   Collection Time    10/10/13 10:52 AM      Result Value Ref Range Status   Specimen Description BRONCHIAL ALVEOLAR LAVAGE   Final   Special Requests Immunocompromised   Final   Fungal Smear     Final   Value: NO YEAST OR FUNGAL ELEMENTS SEEN     Performed at [facility]   Culture     Final   Value: CANDIDA ALBICANS     Performed at [facility]   Report Status PENDING   Incomplete  CULTURE, BAL-QUANTITATIVE     Status: None   Collection Time    10/10/13 10:54 AM      Result Value Ref Range Status   Specimen Description BRONCHIAL ALVEOLAR LAVAGE   Final   Special Requests NONE   Final   Gram Stain     Final   Value: RARE WBC PRESENT,BOTH PMN AND MONONUCLEAR     NO  SQUAMOUS EPITHELIAL CELLS SEEN     NO ORGANISMS SEEN     Performed at [facility]     Final   Value: 100 COLONIES/ML     Performed at [facility]   Culture     Final   Value: Non-Pathogenic Oropharyngeal-type Flora Isolated.      Performed at [facility]   Report Status 10/12/2013 FINAL   Final  CULTURE, BLOOD (ROUTINE X 2)     Status: None   Collection Time    10/13/13  9:00 AM      Result Value Ref Range Status   Specimen Description BLOOD RIGHT ARM   Final   Special Requests BOTTLES DRAWN AEROBIC AND ANAEROBIC 8CC   Final   Culture  Setup Time     Final   Value: 10/13/2013 10:06     Performed at [facility]   Culture     Final   Value:        BLOOD CULTURE RECEIVED NO GROWTH TO DATE CULTURE WILL BE HELD FOR 5 DAYS BEFORE ISSUING A FINAL NEGATIVE REPORT     Performed at [facility]   Report Status PENDING   Incomplete  CULTURE, BLOOD (ROUTINE X 2)     Status: None   Collection Time    10/13/13  9:10 AM      Result Value Ref Range Status   Specimen Description BLOOD LEFT ARM   Final   Special Requests BOTTLES DRAWN AEROBIC AND ANAEROBIC [facility]   Final   Culture  Setup Time     Final   Value: 10/13/2013 10:06     Performed at [facility]   Culture     Final   Value: CANDIDA ALBICANS     Note: Gram Stain Report Called to,Read Back By and Verified With: TZEZT WOODS 10/15/13 [address] [place]     Performed at [facility]   Report Status 10/17/2013 FINAL   Final  URINE CULTURE     Status: None   Collection Time    10/13/13 12:27 PM      Result Value Ref Range Status   Specimen Description URINE, CATHETERIZED   Final   Special Requests NONE   Final   Culture  Setup Time     Final   Value: 10/13/2013 14:45     Performed at [facility]     Final   Value: 5,000 COLONIES/ML     Performed at [facility]   Culture     Final   Value: INSIGNIFICANT GROWTH     Performed at [facility]   Report Status 10/14/2013 FINAL   Final  CULTURE, RESPIRATORY (NON-EXPECTORATED)     Status: None   Collection Time    10/13/13  4:04 PM      Result Value Ref Range Status   Specimen Description ENDOTRACHEAL   Final   Special Requests NONE   Final    Gram Stain     Final   Value: ABUNDANT WBC PRESENT,BOTH PMN AND MONONUCLEAR     NO SQUAMOUS EPITHELIAL CELLS SEEN     NO ORGANISMS SEEN     Performed at [facility]   Culture     Final   Value: Non-Pathogenic Oropharyngeal-type Flora Isolated.     Performed at [facility]   Report Status 10/16/2013 FINAL   Final  CULTURE, BLOOD (ROUTINE X 2)  Status: None   Collection Time    10/17/13  5:00 PM      Result Value Ref Range Status   Specimen Description BLOOD RIGHT RIGHT ANTECUBITAL   Final   Special Requests BOTTLES DRAWN AEROBIC AND ANAEROBIC 10CC   Final   Culture  Setup Time     Final   Value: 10/17/2013 18:56     Performed at [facility]   Culture     Final   Value:        BLOOD CULTURE RECEIVED NO GROWTH TO DATE CULTURE WILL BE HELD FOR 5 DAYS BEFORE ISSUING A FINAL NEGATIVE REPORT     Performed at [facility]   Report Status PENDING   Incomplete  CULTURE, RESPIRATORY (NON-EXPECTORATED)     Status: None   Collection Time    10/17/13  5:00 PM      Result Value Ref Range Status   Specimen Description TRACHEAL ASPIRATE   Final   Special Requests NONE   Final   Gram Stain     Final   Value: MODERATE WBC PRESENT, PREDOMINANTLY PMN     NO SQUAMOUS EPITHELIAL CELLS SEEN     NO ORGANISMS SEEN     Performed at [facility]   Culture     Final   Value: Culture reincubated for better growth     Performed at [facility]   Report Status PENDING   Incomplete  CULTURE, BLOOD (ROUTINE X 2)     Status: None   Collection Time    10/17/13  5:05 PM      Result Value Ref Range Status   Specimen Description BLOOD RIGHT ARM   Final   Special Requests BOTTLES DRAWN AEROBIC AND ANAEROBIC 10CC   Final   Culture  Setup Time     Final   Value: 10/17/2013 18:56     Performed at [facility]   Culture     Final   Value:        BLOOD CULTURE RECEIVED NO GROWTH TO DATE CULTURE WILL BE HELD FOR 5 DAYS BEFORE ISSUING A FINAL NEGATIVE  REPORT     Performed at [facility]   Report Status PENDING   Incomplete    Studies/Results: Dg Chest Port 1 View  10/18/2013   CLINICAL DATA:  Intubation.  EXAM: PORTABLE CHEST - 1 VIEW  COMPARISON:  DG CHEST 1V PORT dated 10/17/2013; DG CHEST 1V PORT dated 10/08/2013  FINDINGS: Endotracheal tube, NG tube, left PICC line in stable position. Persistent dense bilateral airspace disease noted. This is unchanged. No pleural effusion or pneumothorax. Stable cardiomegaly.  IMPRESSION: 1. Line and tube positions stable. 2. Dense persistent unchanged bilateral airspace consolidation. ARDS could present in this fashion.   Electronically Signed   By: Marcello Moores  Register   On: 10/18/2013 07:13   Dg Chest Port 1 View  10/17/2013   CLINICAL DATA:  ARDS.  EXAM: PORTABLE CHEST - 1 VIEW  COMPARISON:  DG CHEST 1V PORT dated 10/16/2013  FINDINGS: Diffuse bilateral airspace opacities appear similar to prior. No pneumothorax. Endotracheal tube, and left upper extremity PICC remain present. Enteric tube remains present. The endotracheal tube tip is 3.8 cm from the carina the cardiopericardial silhouette appears similar although lung volumes are slightly lower on today's examination.  IMPRESSION: 1. Support apparatus in good position. 2. Diffuse bilateral airspace disease compatible with ARDS. 3. Slightly lower lung volumes compared to yesterday.   Electronically Signed  By: Dereck Ligas M.D.   On: 10/17/2013 07:14    Assessment: He has developed increased oxygen requirements today. He has received a total of 5 weeks of pneumocystis therapy including 18 days his most recent round of IV trimethoprim sulfamethoxazole. I doubt that active pneumocystis is playing a role now and will change him to a prophylactic dose of trimethoprim sulfamethoxazole to help avoid any unnecessary volume load. CMV pneumonia could be contributing and I will continue ganciclovir. I would consider continuing tapering of his prednisone.  Anything that can reduce immunosuppression may help him recover from CMV and parainfluenza infections. The lab has just reported that he did have a positive beta  D glucan assay at the time of his last bronchoscopy on April 13. This assay may be helpful in detecting the following pathogens:   Candida spp., Acremonium, Aspergillus spp., Coccidioides immitis, Fusarium spp., Histoplasma capsulatum, Trichosporon spp., Sporothrix schenckii, Saccharomyces cerevisiae, and Pneumocystis jiroveci.  I think is assay may have been elevated because of initial pneumocystis pneumonia when he was first hospitalized last month. His current blood stream infection with Candida albicans could also be a contributor although it was detected after his bronchoscopy. I believe the other pathogens are extremely unlikely in this setting and I would not broaden his antifungal therapy at this time.  I talked to his wife and mother today. They are upset about his deterioration today but want to continue full care.   Plan: 1. Change trimethoprim-sulfamethoxazole to prophylactic dose  2. Continue antiretroviral therapy, ganciclovir and micafungin  3. Await results of repeat blood, urine and sputum cultures 4. Recommended continuing the steroid taper 5. Agree with attempts at diuresis  Michel Bickers, [place] [facility] [place] [phone] pager   [phone] cell 10/18/2013, 3:37 PM

## 2013-10-18 NOTE — Progress Notes (Signed)
Patent HR mid 120's -130's. More elevated then earlier today. CCM notified after treatment with tylenol for slight fever to see if situation would resolve. Order for metoprolol obtained for HR greater then 115.

## 2013-10-18 NOTE — Telephone Encounter (Signed)
[facility] with Dr. Evette CristalGanem, I let them know that patient was in the hospital.

## 2013-10-18 NOTE — Progress Notes (Signed)
Pulmonary/Critical Care Progress Note    Name: William Bender MRN: [id number] DOB: 26-Sep-1965    ADMISSION DATE:  10/12/2013 CONSULTATION DATE:  10/25/2013  REFERRING MD :  Rancour  CHIEF COMPLAINT:  Shortness of breath, fever, cough  BRIEF PATIENT DESCRIPTION:  48 y/o male recently diagnosed with HIV/AIDS -CD4 =30 on 3/31 and treated empirically for PCP returned to the [facility] ED on 4/4 with acute hypoxemic respiratory failure. Note that sputum PCP was neg 3/18 but was on bactrim prior  SIGNIFICANT EVENTS: 4/09 Pneumomediastinum, nimbex added 4/10 desat, lasix started, peep improved to 10   4/13 ETT had migrated, re-advanced and verified w/ FOB. Obtained BAL at this time  4/16 Tm 101F 4/17 1 unit PRBC for Hb 6.8 4/18 Yeast in blood >> added micafungin 4/19 Tolerating pressure support  STUDIES:  4/4 CT angio > no PE, diffuse bilateral GGO and patchy consolidation worse in the bases L > R 4/9 CT chest > pneumomediastinum with subcutaneous emphysema, severe diffuse GGO b/l 4/15 RUE US> NEGATIVE 4/18 TTE > mild LVH, EF 55 to 60%  LINES / TUBES: 4/5 ETT >>4/13 (exchanged)>>> 4/5 R IJ CVL >> 4/17 4/17 Lt PICC >>   CULTURES (truncated 4/18): 4/4 CMV pcr >> POSITIVE 4/5 fungal  bal >> 4/5 afb  bal >> 4/11 resp virus panel: + parainfluenza 2 4/11 AFB blood cx >>> 4/13 fungal BAL: candida albicans>>> 4/13 CMV PCR >>> 101,862 copies/ml 4/13 fungitell >>> 4/16 Blood >>> Yeast in one of two >>> 4/16 Sputum >>>NPF 4/16 Urine >>> negative  ANTIBIOTICS: 4/4 Bactrim >>> 4/4 Vanc >>> 4/4 4/4 Cefepime >>> 4/14 4/6 Gancyclovir >>> 4/16 Vanc >>> 4/18 4/16 cefepime >>> 4/18 4/17 Micafungin >>>4/20 4/19 Zithromax>>> HIV ARV>>>  SUBJECTIVE:  Deteriorated PEEP and FiO2 overnight.  VITAL SIGNS: Temp:  [98.4 F (36.9 C)-99.4 F (37.4 C)] 99.4 F (37.4 C) (04/21 0400) Pulse Rate:  [73-111] 74 (04/21 0500) Resp:  [17-34] 24 (04/21 0500) BP: (96-131)/(42-72) 102/49 mmHg (04/21  0500) SpO2:  [85 %-99 %] 91 % (04/21 0500) FiO2 (%):  [50 %-80 %] 80 % (04/21 0752) Weight:  [153 lb 7 oz (69.6 kg)] 153 lb 7 oz (69.6 kg) (04/21 0500)  VENTILATOR SETTINGS: Vent Mode:  [-] PRVC FiO2 (%):  [50 %-80 %] 80 % Set Rate:  [26 bmp] 26 bmp Vt Set:  [580 mL] 580 mL PEEP:  [8 cmH20] 8 cmH20 Plateau Pressure:  [17 cmH20-29 cmH20] 17 cmH20  INTAKE / OUTPUT:  Intake/Output Summary (Last 24 hours) at 10/18/13 0801 Last data filed at 10/18/13 0528  Gross per 24 hour  Intake 3712.12 ml  Output   4580 ml  Net -867.88 ml    PHYSICAL EXAMINATION: Gen: Sedated and intubated. HEENT: ETT in place. PULM: Scattered rhonchi, diffuse rales. CV: regular, tachycardic. AB: soft, non tender. Neuro: RASS -2. Ext: 1+ edema. Derm: sacral decub noted.  LABS: PULMONARY  Recent Labs Lab 10/18/13 0456  PHART 7.447  PCO2ART 46.2*  PO2ART 55.5*  HCO3 31.3*  TCO2 29.8  O2SAT 85.8   CBC  Recent Labs Lab 10/16/13 0430 10/17/13 0620 10/18/13 0530  HGB 8.4* 7.7* 7.7*  HCT 25.7* 23.5* 21.0*  WBC 9.3 9.8 8.1  PLT 245 200 195   COAGULATION  Recent Labs Lab 10/17/13 0620  INR 1.13   CHEMISTRY  Recent Labs Lab 10/12/13 0457 10/13/13 0400 10/14/13 0345 10/15/13 0531 10/16/13 0430 10/17/13 0620 10/18/13 0530  NA 128* 132* 132* 134* 133* 136* 129*  K 3.9 3.6*  3.6* 3.8 3.8 3.4* 3.6*  CL 82* 87* 89* 92* 91* 96 89*  CO2 40* 39* 36* 34* 34* 29 27  GLUCOSE 186* 111* 132* 137* 99 128* 133*  BUN 25* 26* 21 19 22 19 18   CREATININE 0.52 0.64 0.56 0.51 0.55 0.48* 0.42*  CALCIUM 8.2* 8.3* 7.6* 7.9* 7.8* 7.5* 7.1*  MG 2.0 2.0 2.0  --   --   --  1.6  PHOS 3.3 4.1 4.1  --   --   --  3.5   Estimated Creatinine Clearance: 112.4 ml/min (by C-G formula based on Cr of 0.42).  LIVER  Recent Labs Lab 10/12/13 0457 10/13/13 0400 10/17/13 0620 10/18/13 0530  AST 52* 42*  --  35  ALT 53 62*  --  34  ALKPHOS 64 64  --  101  BILITOT <0.2* <0.2*  --  <0.2*  PROT 5.0* 4.9*  --   4.4*  ALBUMIN 1.7* 1.7*  --  1.2*  INR  --   --  1.13  --    INFECTIOUS  Recent Labs Lab 10/11/13 1100  LATICACIDVEN 2.4*   ENDOCRINE CBG (last 3)   Recent Labs  10/17/13 1949 10/17/13 2309 10/18/13 0352  GLUCAP 90 134* 115*   IMAGING x48h  Dg Chest Port 1 View  10/18/2013   CLINICAL DATA:  Intubation.  EXAM: PORTABLE CHEST - 1 VIEW  COMPARISON:  DG CHEST 1V PORT dated 10/17/2013; DG CHEST 1V PORT dated 10/08/2013  FINDINGS: Endotracheal tube, NG tube, left PICC line in stable position. Persistent dense bilateral airspace disease noted. This is unchanged. No pleural effusion or pneumothorax. Stable cardiomegaly.  IMPRESSION: 1. Line and tube positions stable. 2. Dense persistent unchanged bilateral airspace consolidation. ARDS could present in this fashion.   Electronically Signed   By: Maisie Fushomas  Register   On: 10/18/2013 07:13   Dg Chest Port 1 View  10/17/2013   CLINICAL DATA:  ARDS.  EXAM: PORTABLE CHEST - 1 VIEW  COMPARISON:  DG CHEST 1V PORT dated 10/16/2013  FINDINGS: Diffuse bilateral airspace opacities appear similar to prior. No pneumothorax. Endotracheal tube, and left upper extremity PICC remain present. Enteric tube remains present. The endotracheal tube tip is 3.8 cm from the carina the cardiopericardial silhouette appears similar although lung volumes are slightly lower on today's examination.  IMPRESSION: 1. Support apparatus in good position. 2. Diffuse bilateral airspace disease compatible with ARDS. 3. Slightly lower lung volumes compared to yesterday.   Electronically Signed   By: Andreas NewportGeoffrey  Lamke M.D.   On: 10/17/2013 07:14  no sig improvement   ASSESSMENT / PLAN:  PULMONARY A:  Acute respiratory failure with ARDS 2nd to Parainfluenza PNA and presumed PCP  in setting of HIV. Pneumomediastinum developed 4/09 >> resolved. ETT exchanged 4/13 d/t leak. Increased Oxygen needs 4/16 >> better 4/17. >little improvement radiographically over weekend. Has positive volume  balance.  P:   - F/u CXR in AM. - Increase PEEP to 12 and FiO2 to 80% and titrate today as able. - Changed solumedrol 20 mg IV q12 h on 4/18 >> using in setting of presumed PCP and immune reconstitution. - Will need trach at some point >> discussed and ok with family, however given deterioration in PEEP and FiO2 will evaluate on a day by day bases. - Hold PS trials for today. - Lasix as below.  INFECTIOUS A:   PNA in setting of HIV >> viral pneumonitis (+ parainfluenza 2) +/-bacterial, PCP. Stage II buttock ulcers. CMV viremia. His  parainfluenza 2 was positive >> clinically Significant with ARDS. Fever 101F on 4/16 >> Fungemia from 4/16; TTE negative for vegetation. P:   - Continue to bactrim, zithromax, gancyclovir and micafungin. - Day 5 of micafungin. - Continue bactrim (presumed PCP), ganciclovir (CMV) per ID. - Continue zithromax, diflucan for prophylaxis. - Continue anti-retroviral tx (truvada) per ID. - Wound care consulted to assess sacral decub.  CARDIOVASCULAR A:  Severe sepsis >> hemodynamics improved. Hx of HTN. P:  - Monitor hemodynamics. - Will hold ASA on 4/19 in anticipation of tracheostomy.  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 Now Na trending up Hypokalemia  P:   - Lasix drip 8 mg/hr x 24 hours. - Zaroxolyn x1 dose. - Contine free water. - Replace electrolytes a indicated. - BMET in AM.  GASTROINTESTINAL A:   Protein calorie malnutrition. P:   - Protonix for SUP. - Tube feeds to goal.  HEMATOLOGIC A:   Anemia of critical illness and chronic disease >> no evidence for bleeding. RUE swelling >> doppler negative from 4/15. P:  - F/u CBC. - Continue LMWH for DVT prevention. - Hold anti-coag when ready for trach, not today.  ENDOCRINE A:   Steroid induced hyperglycemia. P:   - SSI.  NEUROLOGIC A:    Anxiety, pain control, sedation >> changed to dilaudid 4/14. Trig 1500 4/21. P:   - D/C propofol. - Continue dilaudid. - Versed  drip.  CC time 35 min.  Alyson ReedyWesam G. Chimere Klingensmith, M.D. [facility]. Pager: [phone]. After hours pager: [phone].

## 2013-10-19 ENCOUNTER — Inpatient Hospital Stay (HOSPITAL_COMMUNITY): Payer: BC Managed Care – PPO

## 2013-10-19 LAB — CULTURE, RESPIRATORY

## 2013-10-19 LAB — TRIGLYCERIDES: TRIGLYCERIDES: 191 mg/dL — AB (ref <150)

## 2013-10-19 LAB — BLOOD GAS, ARTERIAL
Acid-Base Excess: 12.8 mmol/L — ABNORMAL HIGH (ref 0.0–2.0)
BICARBONATE: 39.2 meq/L — AB (ref 20.0–24.0)
Drawn by: 23532
FIO2: 0.8 %
O2 SAT: 93 %
PEEP: 12 cmH2O
Patient temperature: 99.4
Pressure control: 18 cmH2O
RATE: 18 resp/min
TCO2: 36.3 mmol/L (ref 0–100)
pCO2 arterial: 63.7 mmHg (ref 35.0–45.0)
pH, Arterial: 7.408 (ref 7.350–7.450)
pO2, Arterial: 76.2 mmHg — ABNORMAL LOW (ref 80.0–100.0)

## 2013-10-19 LAB — GLUCOSE, CAPILLARY
GLUCOSE-CAPILLARY: 116 mg/dL — AB (ref 70–99)
GLUCOSE-CAPILLARY: 127 mg/dL — AB (ref 70–99)
GLUCOSE-CAPILLARY: 131 mg/dL — AB (ref 70–99)
GLUCOSE-CAPILLARY: 140 mg/dL — AB (ref 70–99)
GLUCOSE-CAPILLARY: 146 mg/dL — AB (ref 70–99)
GLUCOSE-CAPILLARY: 162 mg/dL — AB (ref 70–99)
GLUCOSE-CAPILLARY: 172 mg/dL — AB (ref 70–99)
Glucose-Capillary: 155 mg/dL — ABNORMAL HIGH (ref 70–99)

## 2013-10-19 LAB — CULTURE, BLOOD (ROUTINE X 2): CULTURE: NO GROWTH

## 2013-10-19 LAB — MAGNESIUM: MAGNESIUM: 1.9 mg/dL (ref 1.5–2.5)

## 2013-10-19 LAB — BASIC METABOLIC PANEL
BUN: 21 mg/dL (ref 6–23)
CHLORIDE: 85 meq/L — AB (ref 96–112)
CO2: 40 mEq/L (ref 19–32)
Calcium: 8.1 mg/dL — ABNORMAL LOW (ref 8.4–10.5)
Creatinine, Ser: 0.53 mg/dL (ref 0.50–1.35)
GFR calc non Af Amer: >90 mL/min (ref >90)
Glucose, Bld: 120 mg/dL — ABNORMAL HIGH (ref 70–99)
POTASSIUM: 3.2 meq/L — AB (ref 3.7–5.3)
Sodium: 135 mEq/L — ABNORMAL LOW (ref 137–147)

## 2013-10-19 LAB — CULTURE, RESPIRATORY W GRAM STAIN

## 2013-10-19 LAB — PHOSPHORUS: PHOSPHORUS: 4.8 mg/dL — AB (ref 2.3–4.6)

## 2013-10-19 LAB — CBC
HCT: 30.8 % — ABNORMAL LOW (ref 39.0–52.0)
Hemoglobin: 9.9 g/dL — ABNORMAL LOW (ref 13.0–17.0)
MCH: 29.2 pg (ref 26.0–34.0)
MCHC: 32.1 g/dL (ref 30.0–36.0)
MCV: 90.9 fL (ref 78.0–100.0)
PLATELETS: 232 10*3/uL (ref 150–400)
RBC: 3.39 MIL/uL — AB (ref 4.22–5.81)
RDW: 16.3 % — ABNORMAL HIGH (ref 11.5–15.5)
WBC: 12.2 10*3/uL — AB (ref 4.0–10.5)

## 2013-10-19 MED ORDER — DEXTROSE 5 % IV SOLN
1.0000 g | Freq: Three times a day (TID) | INTRAVENOUS | Status: DC
  Administered 2013-10-19 – 2013-10-21 (×6): 1 g via INTRAVENOUS
  Filled 2013-10-19 (×7): qty 1

## 2013-10-19 MED ORDER — METOLAZONE 5 MG PO TABS
5.0000 mg | ORAL_TABLET | Freq: Every day | ORAL | Status: AC
  Administered 2013-10-19: 5 mg via ORAL
  Filled 2013-10-19 (×2): qty 1

## 2013-10-19 MED ORDER — METHYLPREDNISOLONE SODIUM SUCC 125 MG IJ SOLR
125.0000 mg | Freq: Four times a day (QID) | INTRAMUSCULAR | Status: DC
  Administered 2013-10-19 – 2013-10-22 (×12): 125 mg via INTRAVENOUS
  Filled 2013-10-19 (×20): qty 2

## 2013-10-19 MED ORDER — VANCOMYCIN HCL 10 G IV SOLR
1250.0000 mg | Freq: Three times a day (TID) | INTRAVENOUS | Status: DC
  Administered 2013-10-19 – 2013-10-21 (×6): 1250 mg via INTRAVENOUS
  Filled 2013-10-19 (×7): qty 1250

## 2013-10-19 MED ORDER — FUROSEMIDE 10 MG/ML IJ SOLN
8.0000 mg/h | INTRAVENOUS | Status: AC
  Administered 2013-10-19: 8 mg/h via INTRAVENOUS
  Filled 2013-10-19: qty 25

## 2013-10-19 MED ORDER — POTASSIUM CHLORIDE 20 MEQ/15ML (10%) PO LIQD
40.0000 meq | Freq: Three times a day (TID) | ORAL | Status: AC
  Administered 2013-10-19 (×3): 40 meq
  Filled 2013-10-19 (×3): qty 30

## 2013-10-19 NOTE — Progress Notes (Signed)
10/19/13 1700  Clinical Encounter Type  Visited With Family (wife Harmon PierCaryn, mother Cordelia PenSherry)  Visit Type Follow-up   Followed up twice this afternoon, first connecting briefly with [place] and HoneywellSherry.  Caryn stated, "I'm holding out for a miracle.  I'm thinking positive and holding onto that miracle."  She desired privacy when speaking with Dr Orvan Falconerampbell, so we planned for me to follow up afterward; per Kathlene NovemberMike, RN, family needed to go home for space to process the update and outlook.  [place] will continue to follow closely, but please page as needs arise/POC changes.  Thank you.  [address]Chaplain Japneet Staggs [place], [place] [phone]

## 2013-10-19 NOTE — Progress Notes (Signed)
[id number]/Paxon Propes Earlene Plateravis ,RN, BSN,CCM: Chart reviewed for patient updates and needs. Will follow to see next step due to decline condition and what family wants this to be.

## 2013-10-19 NOTE — Progress Notes (Signed)
ANTIBIOTIC CONSULT NOTE - FOLLOW UP  Pharmacy Consult for vancomycin/cefepime Indication: fever  Allergies  Allergen Reactions  . Penicillins Rash    Patient Measurements: Height: 5' 10" (177.8 cm) Weight: 153 lb 10.6 oz (69.7 kg) IBW/kg (Calculated) : 73 Adjusted Body Weight:   Vital Signs: Temp: 98.7 F (37.1 C) (04/22 1600) Temp src: Oral (04/22 1600) BP: 103/68 mmHg (04/22 1700) Pulse Rate: 104 (04/22 1700) Intake/Output from previous day: 04/21 0701 - 04/22 0700 In: 4250.9 [I.V.:1212.7; NG/GT:1830; IV Piggyback:878.1] Out: 4696 [Urine:6560; Stool:375] Intake/Output from this shift: Total I/O In: 1560 [I.V.:500; Other:270; NG/GT:690; IV Piggyback:100] Out: 1250 [Urine:1250]  Labs:  Recent Labs  10/17/13 0620 10/18/13 0530 10/19/13 0530  WBC 9.8 8.1 12.2*  HGB 7.7* 7.7* 9.9*  PLT 200 195 232  CREATININE 0.48* 0.42* 0.53   Estimated Creatinine Clearance: 112.5 ml/min (by C-G formula based on Cr of 0.53). No results found for this basename: VANCOTROUGH, VANCOPEAK, VANCORANDOM, GENTTROUGH, GENTPEAK, GENTRANDOM, TOBRATROUGH, TOBRAPEAK, TOBRARND, AMIKACINPEAK, AMIKACINTROU, AMIKACIN,  in the last 72 hours   Microbiology: Recent Results (from the past 720 hour(s))  CULTURE, BLOOD (ROUTINE X 2)     Status: None   Collection Time    10/23/2013 10:20 AM      Result Value Ref Range Status   Specimen Description BLOOD LEFT ANTECUBITAL   Final   Special Requests BOTTLES DRAWN AEROBIC AND ANAEROBIC 5 CC EACH   Final   Culture  Setup Time     Final   Value: 10/21/2013 14:52     Performed at [facility]   Culture     Final   Value: NO GROWTH 5 DAYS     Performed at [facility]   Report Status 10/07/2013 FINAL   Final  CULTURE, BLOOD (ROUTINE X 2)     Status: None   Collection Time    10/16/2013 10:35 AM      Result Value Ref Range Status   Specimen Description BLOOD RIGHT ANTECUBITAL   Final   Special Requests BOTTLES DRAWN AEROBIC AND ANAEROBIC [facility]  EACH   Final   Culture  Setup Time     Final   Value: 10/07/2013 14:52     Performed at [facility]   Culture     Final   Value: NO GROWTH 5 DAYS     Performed at [facility]   Report Status 10/07/2013 FINAL   Final  PNEUMOCYSTIS JIROVECI SMEAR BY DFA     Status: None   Collection Time    10/02/13 10:08 AM      Result Value Ref Range Status   Specimen Source-[facility] BRONCHIAL ALVEOLAR LAVAGE   Corrected   Comment: CORRECTED ON 04/05 AT 1024: PREVIOUSLY REPORTED AS BRONCHIAL WASHINGS   Pneumocystis jiroveci Ag NEGATIVE   Final   Comment: Performed at [place], RESPIRATORY (NON-EXPECTORATED)     Status: None   Collection Time    10/02/13 10:08 AM      Result Value Ref Range Status   Specimen Description BRONCHIAL ALVEOLAR LAVAGE   Final   Special Requests Immunocompromised   Final   Gram Stain     Final   Value: RARE WBC PRESENT, PREDOMINANTLY PMN     RARE SQUAMOUS EPITHELIAL CELLS PRESENT     NO ORGANISMS SEEN     Performed at [facility]   Culture     Final   Value: NO GROWTH 2 DAYS  Performed at [facility]   Report Status 10/04/2013 FINAL   Final  FUNGUS CULTURE W SMEAR     Status: None   Collection Time    10/02/13 10:08 AM      Result Value Ref Range Status   Specimen Description BRONCHIAL ALVEOLAR LAVAGE   Final   Special Requests Immunocompromised   Final   Fungal Smear     Final   Value: NO YEAST OR FUNGAL ELEMENTS SEEN     Performed at [facility]   Culture     Final   Value: CULTURE IN PROGRESS FOR FOUR WEEKS     Performed at [facility]   Report Status PENDING   Incomplete  AFB CULTURE WITH SMEAR     Status: None   Collection Time    10/02/13 10:08 AM      Result Value Ref Range Status   Specimen Description BRONCHIAL ALVEOLAR LAVAGE   Final   Special Requests Immunocompromised   Final   Acid Fast Smear     Final   Value: NO ACID FAST BACILLI SEEN     Performed at [facility]   Culture     Final   Value: CULTURE WILL BE EXAMINED FOR 6 WEEKS BEFORE ISSUING A FINAL REPORT     Performed at [facility]   Report Status PENDING   Incomplete  CMV CULTURE CMVC     Status: None   Collection Time    10/02/13 10:08 AM      Result Value Ref Range Status   Specimen Description BRONCHIAL ALVEOLAR LAVAGE   Final   Special Requests Immunocompromised   Final   Culture     Final   Value: No Cytomegalovirus identified in cell culture                                                                A negative result does not exclude the possibility of CMV infection;inappropriate specimen collection,storage and transport may lead to false      negative results.     Performed at [facility]   Report Status 10/05/2013 FINAL   Final  CULTURE, BLOOD (ROUTINE X 2)     Status: None   Collection Time    10/08/13 12:29 PM      Result Value Ref Range Status   Specimen Description BLOOD RIGHT ARM  2.5 ML IN [facility] BOTTLE   Final   Special Requests NONE   Final   Culture  Setup Time     Final   Value: 10/08/2013 17:24     Performed at [facility]   Culture     Final   Value: NO GROWTH 5 DAYS     Performed at [facility]   Report Status 10/14/2013 FINAL   Final  AFB CULTURE, BLOOD     Status: None   Collection Time    10/08/13 12:39 PM      Result Value Ref Range Status   Specimen Description BLOOD LEFT ARM  5 ML IN [facility] BOTTLE   Final   Special Requests NONE   Final   Culture     Final   Value: CULTURE WILL BE EXAMINED FOR 6 WEEKS BEFORE  ISSUING A FINAL REPORT     Performed at [facility]   Report Status PENDING   Incomplete  CULTURE, BLOOD (ROUTINE X 2)     Status: None   Collection Time    10/08/13 12:39 PM      Result Value Ref Range Status   Specimen Description BLOOD LEFT ARM  5 ML IN [facility] BOTTLE   Final   Special Requests NONE   Final   Culture  Setup Time     Final   Value: 10/08/2013 17:24     Performed at  [facility]   Culture     Final   Value: NO GROWTH 5 DAYS     Performed at [facility]   Report Status 10/14/2013 FINAL   Final  RESPIRATORY VIRUS PANEL     Status: Abnormal   Collection Time    10/08/13  4:41 PM      Result Value Ref Range Status   Source - [facility] NASAL SWAB   Corrected   Comment: CORRECTED ON 04/13 AT 2130: PREVIOUSLY REPORTED AS NASAL SWAB   Respiratory Syncytial Virus A NOT DETECTED   Final   Respiratory Syncytial Virus B NOT DETECTED   Final   Influenza A NOT DETECTED   Final   Influenza B NOT DETECTED   Final   Parainfluenza 1 NOT DETECTED   Final   Parainfluenza 2 DETECTED (*)  Final   Parainfluenza 3 NOT DETECTED   Final   Metapneumovirus NOT DETECTED   Final   Rhinovirus NOT DETECTED   Final   Adenovirus NOT DETECTED   Final   Influenza A H1 NOT DETECTED   Final   Influenza A H3 NOT DETECTED   Final   Comment: (NOTE)           Normal Reference Range for each Analyte: NOT DETECTED     Testing performed using the Luminex xTAG Respiratory Viral Panel test     kit.     This test was developed and its performance characteristics determined     by [facility]. It has not been cleared or approved by the [place]     Food and Drug Administration. This test is used for clinical purposes.     It should not be regarded as investigational or for research. This     laboratory is certified under the [place] (CLIA) as qualified to perform high complexity     clinical laboratory testing.     Performed at [facility]     Status: None   Collection Time    10/10/13 10:45 AM      Result Value Ref Range Status   Specimen Description BRONCHIAL ALVEOLAR LAVAGE   Final   Special Requests Immunocompromised   Final   Fungal Smear     Final   Value: NO YEAST OR FUNGAL ELEMENTS SEEN     Performed at [facility]   Report Status 10/11/2013 FINAL   Final  M. TUBERCULOSIS COMPLEX  BY PCR     Status: None   Collection Time    10/10/13 10:45 AM      Result Value Ref Range Status   M. tuberculosis, Direct Not detected  Not detected Final   Comment: (NOTE)     This test(s) was developed and its performance characteristics      have been determined by [employer],  [place], [place]. Performance characteristics refer to the analytical      performance of the test.     Performed at [place] ([facility]) BRONCHIAL ALVEOLAR LAVAGE   Final  FUNGUS CULTURE W SMEAR     Status: None   Collection Time    10/10/13 10:52 AM      Result Value Ref Range Status   Specimen Description BRONCHIAL ALVEOLAR LAVAGE   Final   Special Requests Immunocompromised   Final   Fungal Smear     Final   Value: NO YEAST OR FUNGAL ELEMENTS SEEN     Performed at [facility]   Culture     Final   Value: CANDIDA ALBICANS     Performed at [facility]   Report Status PENDING   Incomplete  CULTURE, BAL-QUANTITATIVE     Status: None   Collection Time    10/10/13 10:54 AM      Result Value Ref Range Status   Specimen Description BRONCHIAL ALVEOLAR LAVAGE   Final   Special Requests NONE   Final   Gram Stain     Final   Value: RARE WBC PRESENT,BOTH PMN AND MONONUCLEAR     NO SQUAMOUS EPITHELIAL CELLS SEEN     NO ORGANISMS SEEN     Performed at [facility]     Final   Value: 100 COLONIES/ML     Performed at [facility]   Culture     Final   Value: Non-Pathogenic Oropharyngeal-type Flora Isolated.     Performed at [facility]   Report Status 10/12/2013 FINAL   Final  CULTURE, BLOOD (ROUTINE X 2)     Status: None   Collection Time    10/13/13  9:00 AM      Result Value Ref Range Status   Specimen Description BLOOD RIGHT ARM   Final   Special Requests BOTTLES DRAWN AEROBIC AND ANAEROBIC 8CC   Final   Culture  Setup Time     Final   Value: 10/13/2013 10:06     Performed at [facility]    Culture     Final   Value: NO GROWTH 5 DAYS     Performed at [facility]   Report Status 10/19/2013 FINAL   Final  CULTURE, BLOOD (ROUTINE X 2)     Status: None   Collection Time    10/13/13  9:10 AM      Result Value Ref Range Status   Specimen Description BLOOD LEFT ARM   Final   Special Requests BOTTLES DRAWN AEROBIC AND ANAEROBIC [facility]   Final   Culture  Setup Time     Final   Value: 10/13/2013 10:06     Performed at [facility]   Culture     Final   Value: CANDIDA ALBICANS     Note: Gram Stain Report Called to,Read Back By and Verified With: TZEZT WOODS 10/15/13 1232A [place]     Performed at [facility]   Report Status 10/17/2013 FINAL   Final  URINE CULTURE     Status: None   Collection Time    10/13/13 12:27 PM      Result Value Ref Range Status   Specimen Description URINE, CATHETERIZED   Final   Special Requests NONE   Final   Culture  Setup Time     Final   Value: 10/13/2013 14:45  Performed at [facility]     Final   Value: 5,000 COLONIES/ML     Performed at [facility]     Final   Value: INSIGNIFICANT GROWTH     Performed at [facility]   Report Status 10/14/2013 FINAL   Final  CULTURE, RESPIRATORY (NON-EXPECTORATED)     Status: None   Collection Time    10/13/13  4:04 PM      Result Value Ref Range Status   Specimen Description ENDOTRACHEAL   Final   Special Requests NONE   Final   Gram Stain     Final   Value: ABUNDANT WBC PRESENT,BOTH PMN AND MONONUCLEAR     NO SQUAMOUS EPITHELIAL CELLS SEEN     NO ORGANISMS SEEN     Performed at [facility]   Culture     Final   Value: Non-Pathogenic Oropharyngeal-type Flora Isolated.     Performed at [facility]   Report Status 10/16/2013 FINAL   Final  URINE CULTURE     Status: None   Collection Time    10/17/13  4:59 PM      Result Value Ref Range Status   Specimen Description URINE, CATHETERIZED   Final   Special  Requests Normal   Final   Culture  Setup Time     Final   Value: 10/18/2013 02:13     Performed at [facility]     Final   Value: NO GROWTH     Performed at [facility]   Culture     Final   Value: NO GROWTH     Performed at [facility]   Report Status 10/18/2013 FINAL   Final  CULTURE, BLOOD (ROUTINE X 2)     Status: None   Collection Time    10/17/13  5:00 PM      Result Value Ref Range Status   Specimen Description BLOOD RIGHT RIGHT ANTECUBITAL   Final   Special Requests BOTTLES DRAWN AEROBIC AND ANAEROBIC 10CC   Final   Culture  Setup Time     Final   Value: 10/17/2013 18:56     Performed at [facility]   Culture     Final   Value:        BLOOD CULTURE RECEIVED NO GROWTH TO DATE CULTURE WILL BE HELD FOR 5 DAYS BEFORE ISSUING A FINAL NEGATIVE REPORT     Performed at [facility]   Report Status PENDING   Incomplete  CULTURE, RESPIRATORY (NON-EXPECTORATED)     Status: None   Collection Time    10/17/13  5:00 PM      Result Value Ref Range Status   Specimen Description TRACHEAL ASPIRATE   Final   Special Requests NONE   Final   Gram Stain     Final   Value: MODERATE WBC PRESENT, PREDOMINANTLY PMN     NO SQUAMOUS EPITHELIAL CELLS SEEN     NO ORGANISMS SEEN     Performed at [facility]   Culture     Final   Value: Non-Pathogenic Oropharyngeal-type Flora Isolated.     Performed at [facility]   Report Status 10/19/2013 FINAL   Final  CULTURE, BLOOD (ROUTINE X 2)     Status: None   Collection Time    10/17/13  5:05 PM      Result Value Ref  Range Status   Specimen Description BLOOD RIGHT ARM   Final   Special Requests BOTTLES DRAWN AEROBIC AND ANAEROBIC 10CC   Final   Culture  Setup Time     Final   Value: 10/17/2013 18:56     Performed at [facility]   Culture     Final   Value:        BLOOD CULTURE RECEIVED NO GROWTH TO DATE CULTURE WILL BE HELD FOR 5 DAYS BEFORE ISSUING A FINAL  NEGATIVE REPORT     Performed at [facility]   Report Status PENDING   Incomplete    Anti-infectives   Start     Dose/Rate Route Frequency Ordered Stop   10/18/13 1600  sulfamethoxazole-trimethoprim (BACTRIM DS) 800-160 MG per tablet 1 tablet     1 tablet Per Tube Daily 10/18/13 1550     10/15/13 0100  micafungin (MYCAMINE) 150 mg in sodium chloride 0.9 % 100 mL IVPB     150 mg 100 mL/hr over 1 Hours Intravenous Daily at bedtime 10/15/13 0056     10/13/13 1800  vancomycin (VANCOCIN) IVPB 1000 mg/200 mL premix  Status:  Discontinued     1,000 mg 200 mL/hr over 60 Minutes Intravenous Every 8 hours 10/13/13 0836 10/15/13 1033   10/13/13 1800  ceFEPIme (MAXIPIME) 2 g in dextrose 5 % 50 mL IVPB  Status:  Discontinued     2 g 100 mL/hr over 30 Minutes Intravenous Every 8 hours 10/13/13 0836 10/15/13 1033   10/13/13 0900  vancomycin (VANCOCIN) 1,750 mg in sodium chloride 0.9 % 500 mL IVPB     1,750 mg 250 mL/hr over 120 Minutes Intravenous  Once 10/13/13 0836 10/13/13 1200   10/13/13 0900  ceFEPIme (MAXIPIME) 2 g in dextrose 5 % 50 mL IVPB     2 g 100 mL/hr over 30 Minutes Intravenous  Once 10/13/13 0836 10/13/13 1000   10/11/13 1600  fluconazole (DIFLUCAN) tablet 100 mg  Status:  Discontinued     100 mg Oral Weekly 10/11/13 1553 10/15/13 1033   10/08/13 1400  ceFEPIme (MAXIPIME) 1 g in dextrose 5 % 50 mL IVPB  Status:  Discontinued     1 g 100 mL/hr over 30 Minutes Intravenous 3 times per day 10/08/13 1119 10/11/13 1553   10/06/13 1000  azithromycin (ZITHROMAX) tablet 1,200 mg     1,200 mg Oral Weekly 10/18/2013 1355     10/03/13 1800  ganciclovir (CYTOVENE) 385 mg in sodium chloride 0.9 % 100 mL IVPB     5 mg/kg  76.5 kg 100 mL/hr over 60 Minutes Intravenous Every 12 hours 10/03/13 1723     10/03/13 1000  dolutegravir (TIVICAY) tablet 50 mg    Comments:  GIVE DOWN THE NG TUBE   50 mg Oral Daily 10/02/13 1554     10/03/13 1000  emtricitabine-tenofovir (TRUVADA) 200-300 MG per  tablet 1 tablet     1 tablet Oral Daily 10/02/13 1554     10/02/13 0800  elvitegravir-cobicistat-emtricitabine-tenofovir (STRIBILD) 150-150-200-300 MG tablet 1 tablet  Status:  Discontinued     1 tablet Oral Daily with breakfast 10/05/2013 1355 10/02/13 1554   10/22/2013 2200  ceFEPIme (MAXIPIME) 1 g in dextrose 5 % 50 mL IVPB  Status:  Discontinued     1 g 100 mL/hr over 30 Minutes Intravenous 3 times per day 09/29/2013 1141 10/08/13 0934   10/18/2013 2000  vancomycin (VANCOCIN) IVPB 750 mg/150 ml premix  Status:  Discontinued  750 mg 150 mL/hr over 60 Minutes Intravenous Every 8 hours 10/05/2013 1141 09/30/2013 1444   10/18/2013 2000  sulfamethoxazole-trimethoprim (BACTRIM) 450 mg in dextrose 5 % 500 mL IVPB  Status:  Discontinued     450 mg 352.1 mL/hr over 90 Minutes Intravenous Every 8 hours 09/28/2013 1141 10/18/13 1550   10/10/2013 1115  ceFEPIme (MAXIPIME) 2 g in dextrose 5 % 50 mL IVPB  Status:  Discontinued     2 g 100 mL/hr over 30 Minutes Intravenous Every 12 hours 10/20/2013 1104 10/15/2013 1141   10/13/2013 1100  sulfamethoxazole-trimethoprim (BACTRIM) 450 mg in dextrose 5 % 500 mL IVPB     450 mg 352.1 mL/hr over 90 Minutes Intravenous  Once 10/27/2013 1054 10/04/13 1643   10/19/2013 1045  vancomycin (VANCOCIN) 1,500 mg in sodium chloride 0.9 % 500 mL IVPB     1,500 mg 250 mL/hr over 120 Minutes Intravenous  Once 09/29/2013 1038 10/03/2013 1300   10/15/2013 1030  vancomycin (VANCOCIN) IVPB 1000 mg/200 mL premix  Status:  Discontinued     1,000 mg 200 mL/hr over 60 Minutes Intravenous  Once 10/14/2013 1024 10/26/2013 1035      Assessment: 52 yoM admitted 4/4 with tachycardia and SOB, concerning for ongoing/recurrent PCP pneumonia. PMH includes recent hospitalization (3/17- 09/20/13) with newly diagnosed HIV, started on ART (Stribild), and treatment for PCP pneumonia. At discharge, he was taking Bactrim, but prematurely decreased his dosage. Pharmacy initially consulted to dose Vancomycin, Cefepime, and  Bactrim. ID consult recommended repeating an entire new course for PCP PNA. 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 Since then, antiinfectives narrowed to bactrim and ganciclovir, with micafungin added for fungemia.  On 4/22 vancomycin and cefepime added as patient febrile 4/21 and continues to have diffuse BL pulmonary infiltrates and increased O2 requirements.   4/4 >> Vanc >> 4/4 4/4 >> Cefepime >> 4/14 4/4 >> Bactrim IV >> 4/21 4/6 >> ganciclovir >> 4/14 >> Qweek fluconazole 151m >> 4/18 PTA >> Qweek azithro (MAC ppx) >> 4/16 >> Vanc >> 4/18, 4/22 >> 4/16 >> Cefepime >> 4/18, 4/22 >> 4/18 >> micafungin >> 4/21>> Septra PO >>  Tmax: 99.4 WBCs: 12.2 Renal: SCr down to 0.42, CrCl >100 ml/min - watch w/ diuresis 4/21 CD4: 30 (4/11), 30 (3/31) HIV RNA quant: 7381(4/11), 13K (3/31)  4/4 blood x2: NGF 4/4 Histoplasma Ag: negative 4/5 PCP by DFA: negative (on Bactrim x2 weeks prior to collection) 4/5 AFB: ngtd, pending, in progress for 6 weeks 4/5 Resp: NGF 4/5 Fungal: ngtd, pending, in progress for 4 weeks 4/5 CMV cx: negative 4/7 fungitell (B glucan test) (-) 4/8 CMV DNA plasma: 78k copies 4/9 cryptococcal Ag (-) 4/11 blood x2: ng-final 4/11 AFB: ngtd, pending, in progress for 6 weeks 4/11 resp virus panel: +parainfluenza 2 4/11 Quantiferon TB: indeterminate 4/13: CMV DNA: > 101,000 (high) 4/13 Fungus w smear (BAL): C.Albicans 4/13 fungal Ab: all negative 4/13 BAL: non-path oro flora - final 4/13 TB PCR: not detected 4/16 Blood: 1/2 candida albicans, 1/2 NGF 4/16 Urine: insig growth 4/20 Urine: NGF 4/20 Trach asp: 4/20 Blood x2: ngtd  Drug level / dose changes info: 4/4: Dr. VTommy Medaldc'd Vanc and changed Cefepime from 2g q8h to 1g q8h. 4/18: VT 12.7 on 1gm q8 (drawn 6h from previous dose)-now d/c   Goal of Therapy:  Vancomycin trough level 15-20 mcg/ml  Plan:   Based on previous levels from earlier this admission, vancomycin 12523mIV q8h  Draw  steady  stated trough in next 1-2 days  Cefepime 1gm IV q8h  Doreene Eland, PharmD, BCPS.   Pager: [phone]  10/19/2013,5:25 PM

## 2013-10-19 NOTE — Progress Notes (Signed)
Patient ID: William Bender, male   DOB: 03-25-1966, 48 y.o.   MRN: [id number]         [facility]    Date of Admission:  10/12/2013                   Day 17 ganciclovir        Day 5 micafungin Principal Problem:   Pneumonia Active Problems:   Acute respiratory failure   AIDS   PCP (pneumocystis carinii pneumonia)   CMV (cytomegalovirus)   Sacral decubitus ulcer, stage II   Hyponatremia   Pneumomediastinum   Fungemia   . antiseptic oral rinse  15 mL Mouth Rinse QID  . azithromycin  1,200 mg Oral Weekly  . chlorhexidine  15 mL Mouth Rinse BID  . collagenase   Topical Daily  . docusate  100 mg Oral BID  . dolutegravir  50 mg Oral Daily  . emtricitabine-tenofovir  1 tablet Oral Daily  . enoxaparin (LOVENOX) injection  40 mg Subcutaneous Daily  . feeding supplement (PRO-STAT SUGAR FREE 64)  30 mL Per Tube Daily  . free water  200 mL Per Tube 3 times per day  . ganciclovir (CYTOVENE) IV  5 mg/kg Intravenous Q12H  . insulin aspart  0-15 Units Subcutaneous 6 times per day  . methylPREDNISolone (SOLU-MEDROL) injection  125 mg Intravenous Q6H  . micafungin (MYCAMINE) IV  150 mg Intravenous QHS  . multivitamin  5 mL Per Tube Daily  . pantoprazole sodium  40 mg Per Tube Daily  . [COMPLETED] potassium chloride  40 mEq Per Tube TID PC & HS  . QUEtiapine  50 mg Per Tube BID  . sodium chloride  10-40 mL Intracatheter Q12H  . sulfamethoxazole-trimethoprim  1 tablet Per Tube Daily    Objective: Temp:  [98.4 F (36.9 C)-101.6 F (38.7 C)] 98.7 F (37.1 C) (04/22 1600) Pulse Rate:  [105-123] 107 (04/22 1600) Resp:  [15-22] 15 (04/22 1600) BP: (103-137)/(63-88) 116/68 mmHg (04/22 1600) SpO2:  [90 %-98 %] 98 % (04/22 1600) FiO2 (%):  [70 %-80 %] 80 % (04/22 1400) Weight:  [153 lb 10.6 oz (69.7 kg)] 153 lb 10.6 oz (69.7 kg) (04/22 0222) I/Os negative 2500 ccs yesterday  General: Sedated on the ventilator.  Lungs: Clear anteriorly Cor: Regular S1-S2 no  murmurs Abdomen: Soft. + diarrhea  Lab Results Lab Results  Component Value Date   WBC 12.2* 10/19/2013   HGB 9.9* 10/19/2013   HCT 30.8* 10/19/2013   MCV 90.9 10/19/2013   PLT 232 10/19/2013    Lab Results  Component Value Date   CREATININE 0.53 10/19/2013   BUN 21 10/19/2013   NA 135* 10/19/2013   K 3.2* 10/19/2013   CL 85* 10/19/2013   CO2 40* 10/19/2013    Lab Results  Component Value Date   ALT 34 10/18/2013   AST 35 10/18/2013   ALKPHOS 101 10/18/2013   BILITOT <0.2* 10/18/2013    HIV 1 RNA Quant (copies/mL)  Date Value  10/08/2013 756*  09/27/2013 13616*  09/13/2013 409811*     CD4 T Cell Abs (/uL)  Date Value  10/08/2013 30*  09/27/2013 30*  09/13/2013 60*    Microbiology: Recent Results (from the past 240 hour(s))  FUNGAL STAIN     Status: None   Collection Time    10/10/13 10:45 AM      Result Value Ref Range Status   Specimen Description BRONCHIAL ALVEOLAR LAVAGE   Final  Special Requests Immunocompromised   Final   Fungal Smear     Final   Value: NO YEAST OR FUNGAL ELEMENTS SEEN     Performed at [facility]   Report Status 10/11/2013 FINAL   Final  M. TUBERCULOSIS COMPLEX BY PCR     Status: None   Collection Time    10/10/13 10:45 AM      Result Value Ref Range Status   M. tuberculosis, Direct Not detected  Not detected Final   Comment: (NOTE)     This test(s) was developed and its performance characteristics      have been determined by [facility],      [place], [place]. Performance characteristics refer to the analytical      performance of the test.     Performed at [facility]   Source (MTBPCR) BRONCHIAL ALVEOLAR LAVAGE   Final  FUNGUS CULTURE W SMEAR     Status: None   Collection Time    10/10/13 10:52 AM      Result Value Ref Range Status   Specimen Description BRONCHIAL ALVEOLAR LAVAGE   Final   Special Requests Immunocompromised   Final   Fungal Smear     Final   Value: NO YEAST OR FUNGAL ELEMENTS SEEN      Performed at [facility]   Culture     Final   Value: CANDIDA ALBICANS     Performed at [facility]   Report Status PENDING   Incomplete  CULTURE, BAL-QUANTITATIVE     Status: None   Collection Time    10/10/13 10:54 AM      Result Value Ref Range Status   Specimen Description BRONCHIAL ALVEOLAR LAVAGE   Final   Special Requests NONE   Final   Gram Stain     Final   Value: RARE WBC PRESENT,BOTH PMN AND MONONUCLEAR     NO SQUAMOUS EPITHELIAL CELLS SEEN     NO ORGANISMS SEEN     Performed at [facility]   Colony Count     Final   Value: 100 COLONIES/ML     Performed at [facility]   Culture     Final   Value: Non-Pathogenic Oropharyngeal-type Flora Isolated.     Performed at [facility]   Report Status 10/12/2013 FINAL   Final  CULTURE, BLOOD (ROUTINE X 2)     Status: None   Collection Time    10/13/13  9:00 AM      Result Value Ref Range Status   Specimen Description BLOOD RIGHT ARM   Final   Special Requests BOTTLES DRAWN AEROBIC AND ANAEROBIC 8CC   Final   Culture  Setup Time     Final   Value: 10/13/2013 10:06     Performed at [facility]   Culture     Final   Value: NO GROWTH 5 DAYS     Performed at [facility]   Report Status 10/19/2013 FINAL   Final  CULTURE, BLOOD (ROUTINE X 2)     Status: None   Collection Time    10/13/13  9:10 AM      Result Value Ref Range Status   Specimen Description BLOOD LEFT ARM   Final   Special Requests BOTTLES DRAWN AEROBIC AND ANAEROBIC [facility]CC   Final   Culture  Setup Time     Final   Value: 10/13/2013 10:06     Performed at  [facility]   Culture     Final   Value: CANDIDA ALBICANS     Note: Gram Stain Report Called to,Read Back By and Verified With: TZEZT WOODS 10/15/13 1232A FULKC     Performed at [facility]   Report Status 10/17/2013 FINAL   Final  URINE CULTURE     Status: None   Collection Time    10/13/13 12:27 PM      Result Value Ref Range  Status   Specimen Description URINE, CATHETERIZED   Final   Special Requests NONE   Final   Culture  Setup Time     Final   Value: 10/13/2013 14:45     Performed at [facility] Count     Final   Value: 5,000 COLONIES/ML     Performed at [facility]   Culture     Final   Value: INSIGNIFICANT GROWTH     Performed at [facility]   Report Status 10/14/2013 FINAL   Final  CULTURE, RESPIRATORY (NON-EXPECTORATED)     Status: None   Collection Time    10/13/13  4:04 PM      Result Value Ref Range Status   Specimen Description ENDOTRACHEAL   Final   Special Requests NONE   Final   Gram Stain     Final   Value: ABUNDANT WBC PRESENT,BOTH PMN AND MONONUCLEAR     NO SQUAMOUS EPITHELIAL CELLS SEEN     NO ORGANISMS SEEN     Performed at [facility]   Culture     Final   Value: Non-Pathogenic Oropharyngeal-type Flora Isolated.     Performed at [facility]   Report Status 10/16/2013 FINAL   Final  URINE CULTURE     Status: None   Collection Time    10/17/13  4:59 PM      Result Value Ref Range Status   Specimen Description URINE, CATHETERIZED   Final   Special Requests Normal   Final   Culture  Setup Time     Final   Value: 10/18/2013 02:13     Performed at [facility] Count     Final   Value: NO GROWTH     Performed at [facility]   Culture     Final   Value: NO GROWTH     Performed at [facility]   Report Status 10/18/2013 FINAL   Final  CULTURE, BLOOD (ROUTINE X 2)     Status: None   Collection Time    10/17/13  5:00 PM      Result Value Ref Range Status   Specimen Description BLOOD RIGHT RIGHT ANTECUBITAL   Final   Special Requests BOTTLES DRAWN AEROBIC AND ANAEROBIC 10CC   Final   Culture  Setup Time     Final   Value: 10/17/2013 18:56     Performed at [facility]   Culture     Final   Value:        BLOOD CULTURE RECEIVED NO GROWTH TO DATE CULTURE WILL BE HELD FOR 5 DAYS BEFORE  ISSUING A FINAL NEGATIVE REPORT     Performed at [facility]   Report Status PENDING   Incomplete  CULTURE, RESPIRATORY (NON-EXPECTORATED)     Status: None   Collection Time    10/17/13  5:00 PM      Result Value Ref Range Status  Specimen Description TRACHEAL ASPIRATE   Final   Special Requests NONE   Final   Gram Stain     Final   Value: MODERATE WBC PRESENT, PREDOMINANTLY PMN     NO SQUAMOUS EPITHELIAL CELLS SEEN     NO ORGANISMS SEEN     Performed at [facility]   Culture     Final   Value: Non-Pathogenic Oropharyngeal-type Flora Isolated.     Performed at [facility]   Report Status 10/19/2013 FINAL   Final  CULTURE, BLOOD (ROUTINE X 2)     Status: None   Collection Time    10/17/13  5:05 PM      Result Value Ref Range Status   Specimen Description BLOOD RIGHT ARM   Final   Special Requests BOTTLES DRAWN AEROBIC AND ANAEROBIC 10CC   Final   Culture  Setup Time     Final   Value: 10/17/2013 18:56     Performed at [facility]   Culture     Final   Value:        BLOOD CULTURE RECEIVED NO GROWTH TO DATE CULTURE WILL BE HELD FOR 5 DAYS BEFORE ISSUING A FINAL NEGATIVE REPORT     Performed at [facility]   Report Status PENDING   Incomplete    Studies/Results: Dg Chest Port 1 View  10/19/2013   CLINICAL DATA:  Endotracheal position  EXAM: PORTABLE CHEST - 1 VIEW  COMPARISON:  10/18/2013  FINDINGS: Endotracheal tube has its tip 3 cm above the carina. Nasogastric tube enters stone eye. Left arm take has its tip in the SVC above the right atrium. Diffuse pulmonary space density persists, possibly with minimal improvement in the right upper lobe but no change in the remainder of the chest. No pleural fluid accumulation. No pneumothorax.  IMPRESSION: Lines and tubes well positioned. Diffuse pulmonary density persists, possibly with slight improvement in the right upper lobe.   Electronically Signed   By: Paulina FusiMark  Shogry M.D.   On: 10/19/2013  07:27   Dg Chest Port 1 View  10/18/2013   CLINICAL DATA:  Intubation.  EXAM: PORTABLE CHEST - 1 VIEW  COMPARISON:  DG CHEST 1V PORT dated 10/17/2013; DG CHEST 1V PORT dated 10/08/2013  FINDINGS: Endotracheal tube, NG tube, left PICC line in stable position. Persistent dense bilateral airspace disease noted. This is unchanged. No pleural effusion or pneumothorax. Stable cardiomegaly.  IMPRESSION: 1. Line and tube positions stable. 2. Dense persistent unchanged bilateral airspace consolidation. ARDS could present in this fashion.   Electronically Signed   By: Maisie Fushomas  Register   On: 10/18/2013 07:13    Assessment: Despite a good diuresis she continues to have diffuse bilateral pulmonary infiltrates and his oxygen requirements have been markedly increased over the past few days. He is also still having intermittent fevers. I will repeat a sputum and blood cultures and send stool for C. difficile PCR. I will also add vancomycin and cefepime to his current antimicrobial regimen. Given the critical worsening of his lung function I agree with a trial of increased steroids.  Plan: 1. Start vancomycin and cefepime 2. Continue antiretroviral therapy, ganciclovir and micafungin  3. Repeat blood and sputum cultures and send stool for C. difficile PCR 4. Agree with increased steroids  Cliffton AstersJohn Krizia Flight, MD [facility] [phone] pager   [phone] cell 10/19/2013, 4:48 PM

## 2013-10-19 NOTE — Progress Notes (Signed)
Pulmonary/Critical Care Progress Note    Name: William Bender MRN: [id number] DOB: 12/04/1965    ADMISSION DATE:  09/30/2013 CONSULTATION DATE:  10/11/2013  REFERRING MD :  Rancour  CHIEF COMPLAINT:  Shortness of breath, fever, cough  BRIEF PATIENT DESCRIPTION:  48 y/o male recently diagnosed with HIV/AIDS -CD4 =30 on 3/31 and treated empirically for PCP [facility] with acute hypoxemic respiratory failure. Note that sputum PCP was neg 3/18 but was on bactrim prior  SIGNIFICANT EVENTS: 4/09 Pneumomediastinum, nimbex added 4/10 desat, lasix started, peep improved to 10   4/13 ETT had migrated, re-advanced and verified w/ FOB. Obtained BAL at this time  4/16 Tm 101F 4/17 1 unit PRBC for Hb 6.8 4/18 Yeast in blood >> added micafungin 4/19 Tolerating pressure support  STUDIES:  4/4 CT angio > no PE, diffuse bilateral GGO and patchy consolidation worse in the bases L > R 4/9 CT chest > pneumomediastinum with subcutaneous emphysema, severe diffuse GGO b/l 4/15 RUE US> NEGATIVE 4/18 TTE > mild LVH, EF 55 to 60%  LINES / TUBES: 4/5 ETT >>4/13 (exchanged)>>> 4/5 R IJ CVL >> 4/17 4/17 Lt PICC >>   CULTURES (truncated 4/18): 4/4 CMV pcr >> POSITIVE 4/5 fungal  bal >> 4/5 afb  bal >> 4/11 resp virus panel: + parainfluenza 2 4/11 AFB blood cx >>> 4/13 fungal BAL: candida albicans>>> 4/13 CMV PCR >>> 101,862 copies/ml 4/13 fungitell >>> 4/16 Blood >>> Yeast in one of two >>> 4/16 Sputum >>>NPF 4/16 Urine >>> negative  ANTIBIOTICS: 4/4 Bactrim >>> 4/4 Vanc >>> 4/4 4/4 Cefepime >>> 4/14 4/6 Gancyclovir >>> 4/16 Vanc >>> 4/18 4/16 cefepime >>> 4/18 4/17 Micafungin >>>4/20 4/19 Zithromax>>> HIV ARV>>>  SUBJECTIVE:  Remains on high PEEP and FiO2, unchanged, febrile overnight.  VITAL SIGNS: Temp:  [98.4 F (36.9 C)-101.6 F (38.7 C)] 100 F (37.8 C) (04/22 0800) Pulse Rate:  [96-122] 120 (04/22 0800) Resp:  [15-28] 20 (04/22 0800) BP: (106-161)/(60-79)  132/72 mmHg (04/22 0800) SpO2:  [90 %-97 %] 90 % (04/22 0800) FiO2 (%):  [70 %-80 %] 80 % (04/22 0821) Weight:  [153 lb 10.6 oz (69.7 kg)] 153 lb 10.6 oz (69.7 kg) (04/22 0222)  VENTILATOR SETTINGS: Vent Mode:  [-] PCV FiO2 (%):  [70 %-80 %] 80 % Set Rate:  [18 bmp] 18 bmp PEEP:  [12 cmH20] 12 cmH20 Plateau Pressure:  [19 cmH20-29 cmH20] 27 cmH20  INTAKE / OUTPUT:  Intake/Output Summary (Last 24 hours) at 10/19/13 0914 Last data filed at 10/19/13 0800  Gross per 24 hour  Intake 4132.75 ml  Output   6600 ml  Net -2467.25 ml    PHYSICAL EXAMINATION: Gen: Sedated and intubated. HEENT: ETT in place. PULM: Scattered rhonchi, diffuse rales. CV: regular, tachycardic. AB: soft, non tender. Neuro: RASS -2. Ext: 1+ edema. Derm: sacral decub noted.  LABS: PULMONARY  Recent Labs Lab 10/18/13 0456 10/19/13 0436  PHART 7.447 7.408  PCO2ART 46.2* 63.7*  PO2ART 55.5* 76.2*  HCO3 31.3* 39.2*  TCO2 29.8 36.3  O2SAT 85.8 93.0   CBC  Recent Labs Lab 10/17/13 0620 10/18/13 0530 10/19/13 0530  HGB 7.7* 7.7* 9.9*  HCT 23.5* 21.0* 30.8*  WBC 9.8 8.1 12.2*  PLT 200 195 232   COAGULATION  Recent Labs Lab 10/17/13 0620  INR 1.13   CHEMISTRY  Recent Labs Lab 10/13/13 0400 10/14/13 0345 10/15/13 0531 10/16/13 0430 10/17/13 0620 10/18/13 0530 10/19/13 0530  NA 132* 132* 134* 133* 136* 129*  135*  K 3.6* 3.6* 3.8 3.8 3.4* 3.6* 3.2*  CL 87* 89* 92* 91* 96 89* 85*  CO2 39* 36* 34* 34* 29 27 40*  GLUCOSE 111* 132* 137* 99 128* 133* 120*  BUN 26* 21 19 22 19 18 21   CREATININE 0.64 0.56 0.51 0.55 0.48* 0.42* 0.53  CALCIUM 8.3* 7.6* 7.9* 7.8* 7.5* 7.1* 8.1*  MG 2.0 2.0  --   --   --  1.6 1.9  PHOS 4.1 4.1  --   --   --  3.5 4.8*   Estimated Creatinine Clearance: 112.5 ml/min (by C-G formula based on Cr of 0.53).  LIVER  Recent Labs Lab 10/13/13 0400 10/17/13 0620 10/18/13 0530  AST 42*  --  35  ALT 62*  --  34  ALKPHOS 64  --  101  BILITOT <0.2*  --  <0.2*   PROT 4.9*  --  4.4*  ALBUMIN 1.7*  --  1.2*  INR  --  1.13  --    INFECTIOUS No results found for this basename: LATICACIDVEN, PROCALCITON,  in the last 168 hours ENDOCRINE CBG (last 3)   Recent Labs  10/18/13 2050 10/18/13 2343 10/19/13 0315  GLUCAP 139* 146* 127*   IMAGING x48h  Dg Chest Port 1 View  10/19/2013   CLINICAL DATA:  Endotracheal position  EXAM: PORTABLE CHEST - 1 VIEW  COMPARISON:  10/18/2013  FINDINGS: Endotracheal tube has its tip 3 cm above the carina. Nasogastric tube enters stone eye. Left arm take has its tip in the SVC above the right atrium. Diffuse pulmonary space density persists, possibly with minimal improvement in the right upper lobe but no change in the remainder of the chest. No pleural fluid accumulation. No pneumothorax.  IMPRESSION: Lines and tubes well positioned. Diffuse pulmonary density persists, possibly with slight improvement in the right upper lobe.   Electronically Signed   By: Paulina FusiMark  Shogry M.D.   On: 10/19/2013 07:27   Dg Chest Port 1 View  10/18/2013   CLINICAL DATA:  Intubation.  EXAM: PORTABLE CHEST - 1 VIEW  COMPARISON:  DG CHEST 1V PORT dated 10/17/2013; DG CHEST 1V PORT dated 10/08/2013  FINDINGS: Endotracheal tube, NG tube, left PICC line in stable position. Persistent dense bilateral airspace disease noted. This is unchanged. No pleural effusion or pneumothorax. Stable cardiomegaly.  IMPRESSION: 1. Line and tube positions stable. 2. Dense persistent unchanged bilateral airspace consolidation. ARDS could present in this fashion.   Electronically Signed   By: Maisie Fushomas  Register   On: 10/18/2013 07:13  no sig improvement   ASSESSMENT / PLAN:  PULMONARY A:  Acute respiratory failure with ARDS 2nd to Parainfluenza PNA and presumed PCP  in setting of HIV. Pneumomediastinum developed 4/09 >> resolved. ETT exchanged 4/13 d/t leak. Increased Oxygen needs 4/16 >> better 4/17. >little improvement radiographically over weekend. Has positive  volume balance.  P:   - F/u CXR in AM. - Maintain PEEP to 12 and FiO2 to 80%. - Increase steroids to 125 mg IV q6 then reduce back to 20 q12 after 24 hours. - Will need trach at some point >> remains on high FiO2 and PEEP, concern relayed to father today regarding lack of improvement. - Hold PS trials with high FiO2 and PEEP. - Lasix as below.  INFECTIOUS A:   PNA in setting of HIV >> viral pneumonitis (+ parainfluenza 2) +/-bacterial, PCP. Stage II buttock ulcers. CMV viremia. His parainfluenza 2 was positive >> clinically Significant with ARDS. Fever 101F  on 4/16 >> Fungemia from 4/16; TTE negative for vegetation. P:   - Continue to bactrim, zithromax, gancyclovir and micafungin. - Day 6 of micafungin. - Continue bactrim (presumed PCP), ganciclovir (CMV) per ID. - Continue zithromax, diflucan for prophylaxis. - Continue anti-retroviral tx (truvada) per ID. - Wound care consulted to assess sacral decub.  CARDIOVASCULAR A:  Severe sepsis >> hemodynamics improved. Hx of HTN. P:  - Monitor hemodynamics. - Will hold ASA on 4/19 in anticipation of tracheostomy.  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 Now Na trending up Hypokalemia  P:   - Lasix drip 8 mg/hr x 24 hours. - Zaroxolyn x1 dose. - Contine free water. - Replace electrolytes a indicated. - BMET in AM.  GASTROINTESTINAL A:   Protein calorie malnutrition. P:   - Protonix for SUP. - Tube feeds to goal.  HEMATOLOGIC A:   Anemia of critical illness and chronic disease >> no evidence for bleeding. RUE swelling >> doppler negative from 4/15. P:  - F/u CBC. - Continue LMWH for DVT prevention. - Hold anti-coag when ready for trach, not today.  ENDOCRINE A:   Steroid induced hyperglycemia. P:   - SSI.  NEUROLOGIC A:    Anxiety, pain control, sedation >> changed to dilaudid 4/14. Trig 1500 4/21. P:   - D/C propofol. - Continue dilaudid. - Versed drip.  Spoke with father extensively today, relayed  concern that inspite of significant diureses that patient made no headway in O2 demand and that now the concern is that patient's lungs are damaged beyond repair.  Not exactly sure if lung transplant would be effective in a patient that has such poor functional status with HIV and FiO2 and PEEP demand.  Will communicate with [facility] transplant physician to see if patient is a candidate for transplant.  Will attempt high dose steroids and continue diureses, but realistically if no headway is made by tomorrow will need to discuss withdrawal.  CC time 45 min.  Alyson Reedy, M.D. [facility] Pulmonary/Critical Care Medicine. Pager: [phone]. After hours pager: [phone].

## 2013-10-19 NOTE — Progress Notes (Signed)
Spoke with [facility] transplant MD.  The patient is not a transplant candidate, they have never transplanted an HIV patient but if patient improves enough and lung function remains poor then they are willing to see him in the clinic.  We also discussed ecmo and other options but it does not seem that the family is terribly interested in simply maintaining alive on machine and more interested in quality of life.  Spoke with father, he is going home to bring patient's mother and will discuss options with wife.  Additional CC time of 45 min.  Alyson ReedyWesam G. Yacoub, M.D. [facility]Critical [facility] Medicine. Pager: [phone]. After hours pager: [phone].

## 2013-10-19 NOTE — Progress Notes (Signed)
10/19/13 1200  Clinical Encounter Type  Visited With Family (parents Cordelia PenSherry and [place])  Visit Type Spiritual support;Social support  Spiritual Encounters  Spiritual Needs Prayer;Emotional;Grief support   Visited with Jackey's mom Cordelia PenSherry (briefly also with Louanne Skyeick) for almost two hours this morning, providing spiritual and emotional support as she (they) processed Koron's continued decline and how to discern about and cope with next steps.  Provided pastoral presence, reflective listening, grief support and education, discussion about support resources, and prayer.  Will continue to follow closely, but please also page as needs arise, especially when Zakarie's wife Caryn visits again.  Thank you.  [address] W[address] DriveChaplain Carmeline Kowal [place], [place] [phone]

## 2013-10-20 ENCOUNTER — Inpatient Hospital Stay (HOSPITAL_COMMUNITY): Payer: BC Managed Care – PPO

## 2013-10-20 LAB — CBC
HCT: 29.6 % — ABNORMAL LOW (ref 39.0–52.0)
HEMOGLOBIN: 9.4 g/dL — AB (ref 13.0–17.0)
MCH: 29.5 pg (ref 26.0–34.0)
MCHC: 31.8 g/dL (ref 30.0–36.0)
MCV: 92.8 fL (ref 78.0–100.0)
Platelets: 247 10*3/uL (ref 150–400)
RBC: 3.19 MIL/uL — ABNORMAL LOW (ref 4.22–5.81)
RDW: 16 % — ABNORMAL HIGH (ref 11.5–15.5)
WBC: 10.6 10*3/uL — ABNORMAL HIGH (ref 4.0–10.5)

## 2013-10-20 LAB — BASIC METABOLIC PANEL
BUN: 37 mg/dL — ABNORMAL HIGH (ref 6–23)
CO2: 41 mEq/L (ref 19–32)
Calcium: 8.1 mg/dL — ABNORMAL LOW (ref 8.4–10.5)
Chloride: 90 mEq/L — ABNORMAL LOW (ref 96–112)
Creatinine, Ser: 0.59 mg/dL (ref 0.50–1.35)
GFR calc Af Amer: >90 mL/min (ref >90)
GLUCOSE: 186 mg/dL — AB (ref 70–99)
POTASSIUM: 3.8 meq/L (ref 3.7–5.3)
SODIUM: 141 meq/L (ref 137–147)

## 2013-10-20 LAB — PHOSPHORUS: Phosphorus: 4.2 mg/dL (ref 2.3–4.6)

## 2013-10-20 LAB — BLOOD GAS, ARTERIAL
ACID-BASE EXCESS: 16.6 mmol/L — AB (ref 0.0–2.0)
Bicarbonate: 44.1 mEq/L — ABNORMAL HIGH (ref 20.0–24.0)
DRAWN BY: 317871
FIO2: 0.7 %
O2 SAT: 97.1 %
PATIENT TEMPERATURE: 99.6
PEEP/CPAP: 12 cmH2O
Pressure control: 18 cmH2O
RATE: 18 resp/min
TCO2: 41.1 mmol/L (ref 0–100)
pCO2 arterial: 76.1 mmHg (ref 35.0–45.0)
pH, Arterial: 7.384 (ref 7.350–7.450)
pO2, Arterial: 104 mmHg — ABNORMAL HIGH (ref 80.0–100.0)

## 2013-10-20 LAB — GLUCOSE, CAPILLARY
GLUCOSE-CAPILLARY: 194 mg/dL — AB (ref 70–99)
Glucose-Capillary: 184 mg/dL — ABNORMAL HIGH (ref 70–99)
Glucose-Capillary: 186 mg/dL — ABNORMAL HIGH (ref 70–99)
Glucose-Capillary: 202 mg/dL — ABNORMAL HIGH (ref 70–99)
Glucose-Capillary: 185 mg/dL — ABNORMAL HIGH (ref 70–99)
Glucose-Capillary: 178 mg/dL — ABNORMAL HIGH (ref 70–99)
Glucose-Capillary: 193 mg/dL — ABNORMAL HIGH (ref 70–99)

## 2013-10-20 LAB — MAGNESIUM: Magnesium: 2.4 mg/dL (ref 1.5–2.5)

## 2013-10-20 LAB — CLOSTRIDIUM DIFFICILE BY PCR: Toxigenic C. Difficile by PCR: NEGATIVE

## 2013-10-20 MED ORDER — ACETAZOLAMIDE SODIUM 500 MG IJ SOLR
250.0000 mg | Freq: Four times a day (QID) | INTRAMUSCULAR | Status: AC
  Administered 2013-10-20 (×3): 250 mg via INTRAVENOUS
  Filled 2013-10-20 (×3): qty 500

## 2013-10-20 MED ORDER — PROPOFOL 10 MG/ML IV EMUL: 5.0000 ug/kg/min | Freq: Once | INTRAVENOUS | Status: DC

## 2013-10-20 MED ORDER — OXEPA PO LIQD
1000.0000 mL | ORAL | Status: DC
  Administered 2013-10-20 – 2013-10-27 (×7): 1000 mL
  Filled 2013-10-20 (×9): qty 1000

## 2013-10-20 MED ORDER — ETOMIDATE 2 MG/ML IV SOLN
40.0000 mg | Freq: Once | INTRAVENOUS | Status: AC
  Administered 2013-10-21: 10 mg via INTRAVENOUS
  Filled 2013-10-20: qty 20

## 2013-10-20 MED ORDER — VECURONIUM BROMIDE 10 MG IV SOLR
10.0000 mg | Freq: Once | INTRAVENOUS | Status: AC
  Administered 2013-10-21: 8 mg via INTRAVENOUS

## 2013-10-20 MED ORDER — POTASSIUM CHLORIDE 20 MEQ/15ML (10%) PO LIQD
40.0000 meq | Freq: Three times a day (TID) | ORAL | Status: AC
  Administered 2013-10-20 (×3): 40 meq
  Filled 2013-10-20 (×3): qty 30

## 2013-10-20 MED ORDER — MIDAZOLAM HCL 2 MG/2ML IJ SOLN
4.0000 mg | Freq: Once | INTRAMUSCULAR | Status: AC
  Administered 2013-10-21: 4 mg via INTRAVENOUS

## 2013-10-20 MED ORDER — FUROSEMIDE 10 MG/ML IJ SOLN
10.0000 mg/h | INTRAVENOUS | Status: DC
  Administered 2013-10-20: 10 mg/h via INTRAVENOUS
  Filled 2013-10-20: qty 25

## 2013-10-20 MED ORDER — METOLAZONE 5 MG PO TABS
5.0000 mg | ORAL_TABLET | Freq: Every day | ORAL | Status: AC
  Administered 2013-10-20: 5 mg via ORAL
  Filled 2013-10-20: qty 1

## 2013-10-20 MED ORDER — FENTANYL CITRATE 0.05 MG/ML IJ SOLN
200.0000 ug | Freq: Once | INTRAMUSCULAR | Status: AC
  Administered 2013-10-21: 200 ug via INTRAVENOUS
  Filled 2013-10-20: qty 4

## 2013-10-20 MED ORDER — VECURONIUM BROMIDE 10 MG IV SOLR: 10.0000 mg | Freq: Once | INTRAVENOUS | Status: DC

## 2013-10-20 MED ORDER — FENTANYL CITRATE 0.05 MG/ML IJ SOLN: 200.0000 ug | Freq: Once | INTRAMUSCULAR | Status: DC

## 2013-10-20 MED ORDER — PRO-STAT SUGAR FREE PO LIQD
30.0000 mL | Freq: Two times a day (BID) | ORAL | Status: DC
  Administered 2013-10-20 – 2013-10-27 (×14): 30 mL
  Filled 2013-10-20 (×15): qty 30

## 2013-10-20 MED ORDER — MIDAZOLAM HCL 2 MG/2ML IJ SOLN: 4.0000 mg | Freq: Once | INTRAMUSCULAR | Status: DC

## 2013-10-20 MED ORDER — ETOMIDATE 2 MG/ML IV SOLN
40.0000 mg | Freq: Once | INTRAVENOUS | Status: DC
  Filled 2013-10-20: qty 20

## 2013-10-20 NOTE — Progress Notes (Signed)
[id number]/Rhonda Earlene PlaterDavis, RN, BSN, [place] [phone] Chart Reviewed for discharge and hospital needs. REMAINS ON FULL VENT SUPPORT/ IV SEDATION/ POSS TRACH AND PEG TUBE TODAY OR TOMORROW/FAMILY ATTEMPTING TO MAKE A DECISION CONCERNING FUTURE CONDITION AND CARE-SNF WITH TRACH AND PEG OR TERMINAL WEAN/ Discharge needs at time of review: None present will follow for needs. Review of patient progress due on [id number].

## 2013-10-20 NOTE — Progress Notes (Signed)
NUTRITION FOLLOW UP  Intervention:   - Decrease TF rate of Oxepa to 32m/hr and increase Prostat 375mto BID. This will provide 2000 calories, 105g protein, 94242mree water and meet 104% estimated calorie needs and 100% estimated protein needs - Will continue to monitor   Nutrition Dx:   Inadequate oral intake related to inability to eat as evidenced by NPO - ongoing   Goal:   TF regimen to meet >/= 90% of their estimated nutrition needs - met  Monitor:   Weights, labs, TF tolerance, vent status   Assessment:   [age] male recently diagnosed with HIV and treated empirically for PCP [facility] with acute hypoxemic respiratory failure.   4/5 - Pt intubated 4/5. Per family, pt has lost ~15-20 lbs over the past few weeks. He is using nutritional supplements at home (Boost or Ensure.). Received consult for TF management, RD ordered Vital AF 1.2 @ 20 ml/hr via OG tube and increase by 10 ml every 4 hours to goal rate of 80 ml/hr. At goal rate, tube feeding regimen will provide 2304 kcal, 103 grams of protein, and 1549 ml of H2O. Goal rate meets 100% of estimated calorie and protein needs.   4/7 - Pt started on ARDS protocol 4/6. Per RN, pt tolerating Vital AF 1.2 at goal rate of 98m4m with 30ml33mresiduals this morning and 30-40ml 64mduals overnight. Remains intubated.   4/10 - Patient remains intubated.  Receiving Oxepa at 20 ml/hr plus prostat and tolerating well. Propofol: 27.5 ml/hr provides 726 calories continues  4/13 - Remains intubated. Receiving Oxepa at 30ml/h73mth Prosat 30ml QI28mprovides 1480 calories, 105g protein. TF plus current calories from Propofol provide 2327 calories meeting 124% estimated calorie needs and 100% estimated protein needs. Paralytics added 4/10. Needed ET tube changed today which was performed after emergency bronchoscopy. Per RN, pt tolerating TF well without any residuals.   4/17 - Remains intubated. Receiving Oxepa to 20 ml/hr as  pt getting high rate of Propfol. Oxepa at 20ml/hr 37m Prostat 30ml QID 68m calories from current Propfol rate will provide 1967 calories, 90g protein, and 377ml free 60mr and meet 105% estimated calorie needs and 86% estimated protein needs. Per RN, pt tolerating TF with no residuals.   4/20 - Remains intubated. Receiving Oxepa to 20 ml/hr as pt getting high rate of Propfol. Oxepa at 20ml/hr wit84mostat 30ml QID wit73mlories from current Propfol rate will provide 1967 calories, 90g protein, and 377ml free wat60mnd meet 105% of previously estimated calorie needs and 86% estimated protein needs. Per conversation with RN, only 10ml of TF res18mls this morning. D/C Prostat and Oxepa. Start TF via OGT of Vital High Protein at 20ml/hr increas4m 10ml every 4 hou75mo goal of 60ml/hr. This wil29movide 1440 calories, 126g protein, 1203 ml water. Goal rate plus calories from Propofol will provide 2348 calories and meet 101% estimated calorie needs and 101% estimated protein needs. If IVF d/c, recommend 200ml water flushes29mimes/day. Changed TF rate as pt getting so little of Oxepa for TF to be beneficial.   4/21 - Remains intubated. Receiving Vital High Protein at 50ml/hr which provi59m1200 calories, 105g protein, and 1003ml free water whic58mets 52% of previously estimated calorie needs and 100% estimated protein needs. Continues to have severe ARDS per RN. 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 RN reports pt tolerating TF well without any residuals. Will  change TF back to Oxepa for pt to get specialized nutrition now that pt no longer on Propofol. Ordered Oxepa at 42m/hr increase by 139mevery 4 hours to goal of 6026mr with Prostat 39m67mily  4/23 - Remains intubated. Per RN, pt tolerating TF well without any residuals, at goal rate. RN states they are awaiting family decision on terminal wean.   TF: Oxepa at 60ml8mwith Prostat 39ml 65my - provides 2260 calories,  105g protein, 1139ml f32mwater and meets 118% estimated calorie needs, 100% estimated protein needs  Patient is currently intubated on ventilator support MV: 10.5 L/min Temp (24hrs), Avg:98.9 F (37.2 C), Min:98.2 F (36.8 C), Max:99.6 F (37.6 C)  Propofol: off     Height: Ht Readings from Last 1 Encounters:  10/16/13 5' 10"  (1.778 m)    Weight Status:   Wt Readings from Last 1 Encounters:  10/20/13 152 lb 12.5 oz (69.3 kg)  Admit wt:        155 lb (70.3 kg) Net I/Os: +1.9L  Re-estimated needs:  Kcal: 1917 Protein: 105-125g Fluid: per MD  Skin: +1 Generalized edema, non-pitting RUE, LUE, LLE, RLE edema, stage 2 sacral pressure ulcer on buttocks    Diet Order: NPO   Intake/Output Summary (Last 24 hours) at 10/20/13 1311 Last data filed at 10/20/13 1300  Gross per 24 hour  Intake   4067 ml  Output   4500 ml  Net   -433 ml    Last BM: 4/23   Labs:   Recent Labs Lab 10/18/13 0530 10/19/13 0530 10/20/13 0535  NA 129* 135* 141  K 3.6* 3.2* 3.8  CL 89* 85* 90*  CO2 27 40* 41*  BUN 18 21 37*  CREATININE 0.42* 0.53 0.59  CALCIUM 7.1* 8.1* 8.1*  MG 1.6 1.9 2.4  PHOS 3.5 4.8* 4.2  GLUCOSE 133* 120* 186*    CBG (last 3)   Recent Labs  10/19/13 2334 10/20/13 0325 10/20/13 0747  GLUCAP 184* 186* 202*    Scheduled Meds: . acetaZOLAMIDE  250 mg Intravenous Q6H  . antiseptic oral rinse  15 mL Mouth Rinse QID  . azithromycin  1,200 mg Oral Weekly  . ceFEPime (MAXIPIME) IV  1 g Intravenous 3 times per day  . chlorhexidine  15 mL Mouth Rinse BID  . collagenase   Topical Daily  . docusate  100 mg Oral BID  . dolutegravir  50 mg Oral Daily  . emtricitabine-tenofovir  1 tablet Oral Daily  . enoxaparin (LOVENOX) injection  40 mg Subcutaneous Daily  . feeding supplement (PRO-STAT SUGAR FREE 64)  30 mL Per Tube Daily  . free water  200 mL Per Tube 3 times per day  . ganciclovir (CYTOVENE) IV  5 mg/kg Intravenous Q12H  . insulin aspart  0-15 Units  Subcutaneous 6 times per day  . methylPREDNISolone (SOLU-MEDROL) injection  125 mg Intravenous Q6H  . micafungin (MYCAMINE) IV  150 mg Intravenous QHS  . multivitamin  5 mL Per Tube Daily  . pantoprazole sodium  40 mg Per Tube Daily  . potassium chloride  40 mEq Per Tube TID PC & HS  . QUEtiapine  50 mg Per Tube BID  . sodium chloride  10-40 mL Intracatheter Q12H  . sulfamethoxazole-trimethoprim  1 tablet Per Tube Daily  . vancomycin  1,250 mg Intravenous Q8H    Continuous Infusions: . feeding supplement (OXEPA) 1,000 mL (10/20/13 0528)  . furosemide (LASIX) infusion 10 mg/hr (10/20/13 1300)  . HYDROmorphone 6 mg/hr (  10/20/13 1300)  . midazolam (VERSED) infusion 10 mg/hr (10/20/13 1300)     Mikey College MS, RD, LDN [phone] Pager [phone] After Hours Pager

## 2013-10-20 NOTE — Progress Notes (Signed)
Patient ID: William Bender, male   DOB: 02/25/1966, 48 y.o.   MRN: [id number]         [facility]    Date of Admission:  10/21/2013                   Day 18 ganciclovir        Day 6 micafungin        Day 2 vancomycin        Day 2 cefepime Principal Problem:   Pneumonia Active Problems:   Acute respiratory failure   AIDS   PCP (pneumocystis carinii pneumonia)   CMV (cytomegalovirus)   Sacral decubitus ulcer, stage II   Hyponatremia   Pneumomediastinum   Fungemia   . acetaZOLAMIDE  250 mg Intravenous Q6H  . antiseptic oral rinse  15 mL Mouth Rinse QID  . azithromycin  1,200 mg Oral Weekly  . ceFEPime (MAXIPIME) IV  1 g Intravenous 3 times per day  . chlorhexidine  15 mL Mouth Rinse BID  . collagenase   Topical Daily  . docusate  100 mg Oral BID  . dolutegravir  50 mg Oral Daily  . emtricitabine-tenofovir  1 tablet Oral Daily  . enoxaparin (LOVENOX) injection  40 mg Subcutaneous Daily  . feeding supplement (PRO-STAT SUGAR FREE 64)  30 mL Per Tube BID  . free water  200 mL Per Tube 3 times per day  . ganciclovir (CYTOVENE) IV  5 mg/kg Intravenous Q12H  . insulin aspart  0-15 Units Subcutaneous 6 times per day  . methylPREDNISolone (SOLU-MEDROL) injection  125 mg Intravenous Q6H  . micafungin (MYCAMINE) IV  150 mg Intravenous QHS  . multivitamin  5 mL Per Tube Daily  . pantoprazole sodium  40 mg Per Tube Daily  . potassium chloride  40 mEq Per Tube TID PC & HS  . QUEtiapine  50 mg Per Tube BID  . sodium chloride  10-40 mL Intracatheter Q12H  . sulfamethoxazole-trimethoprim  1 tablet Per Tube Daily  . vancomycin  1,250 mg Intravenous Q8H    Objective: Temp:  [98.2 F (36.8 C)-99.6 F (37.6 C)] 99.1 F (37.3 C) (04/23 1201) Pulse Rate:  [104-129] 125 (04/23 1600) Resp:  [8-21] 18 (04/23 1600) BP: (97-134)/(59-88) 134/82 mmHg (04/23 1600) SpO2:  [93 %-99 %] 96 % (04/23 1600) FiO2 (%):  [50 %-80 %] 50 % (04/23 1600) Weight:  [152 lb 12.5 oz  (69.3 kg)] 152 lb 12.5 oz (69.3 kg) (04/23 0500)   General: Sedated on the ventilator.  Lungs: Clear anteriorly Cor: Regular S1-S2 no murmurs Abdomen: Soft.   Lab Results Lab Results  Component Value Date   WBC 10.6* 10/20/2013   HGB 9.4* 10/20/2013   HCT 29.6* 10/20/2013   MCV 92.8 10/20/2013   PLT 247 10/20/2013    Lab Results  Component Value Date   CREATININE 0.59 10/20/2013   BUN 37* 10/20/2013   NA 141 10/20/2013   K 3.8 10/20/2013   CL 90* 10/20/2013   CO2 41* 10/20/2013    Lab Results  Component Value Date   ALT 34 10/18/2013   AST 35 10/18/2013   ALKPHOS 101 10/18/2013   BILITOT <0.2* 10/18/2013    HIV 1 RNA Quant (copies/mL)  Date Value  10/08/2013 756*  09/27/2013 13616*  09/13/2013 [id number]*     CD4 T Cell Abs (/uL)  Date Value  10/08/2013 30*  09/27/2013 30*  09/13/2013 60*    Microbiology: Recent Results (from the past  240 hour(s))  CULTURE, BLOOD (ROUTINE X 2)     Status: None   Collection Time    10/13/13  9:00 AM      Result Value Ref Range Status   Specimen Description BLOOD RIGHT ARM   Final   Special Requests BOTTLES DRAWN AEROBIC AND ANAEROBIC 8CC   Final   Culture  Setup Time     Final   Value: 10/13/2013 10:06     Performed at [facility]   Culture     Final   Value: NO GROWTH 5 DAYS     Performed at [facility]   Report Status 10/19/2013 FINAL   Final  CULTURE, BLOOD (ROUTINE X 2)     Status: None   Collection Time    10/13/13  9:10 AM      Result Value Ref Range Status   Specimen Description BLOOD LEFT ARM   Final   Special Requests BOTTLES DRAWN AEROBIC AND ANAEROBIC [facility]   Final   Culture  Setup Time     Final   Value: 10/13/2013 10:06     Performed at [facility]   Culture     Final   Value: CANDIDA ALBICANS     Note: Gram Stain Report Called to,Read Back By and Verified With: TZEZT WOODS 10/15/13 1232A FULKC     Performed at [facility]   Report Status 10/17/2013 FINAL   Final  URINE CULTURE     Status:  None   Collection Time    10/13/13 12:27 PM      Result Value Ref Range Status   Specimen Description URINE, CATHETERIZED   Final   Special Requests NONE   Final   Culture  Setup Time     Final   Value: 10/13/2013 14:45     Performed at [facility] Count     Final   Value: 5,000 COLONIES/ML     Performed at [facility]   Culture     Final   Value: INSIGNIFICANT GROWTH     Performed at [facility]   Report Status 10/14/2013 FINAL   Final  CULTURE, RESPIRATORY (NON-EXPECTORATED)     Status: None   Collection Time    10/13/13  4:04 PM      Result Value Ref Range Status   Specimen Description ENDOTRACHEAL   Final   Special Requests NONE   Final   Gram Stain     Final   Value: ABUNDANT WBC PRESENT,BOTH PMN AND MONONUCLEAR     NO SQUAMOUS EPITHELIAL CELLS SEEN     NO ORGANISMS SEEN     Performed at [facility]   Culture     Final   Value: Non-Pathogenic Oropharyngeal-type Flora Isolated.     Performed at [facility]   Report Status 10/16/2013 FINAL   Final  URINE CULTURE     Status: None   Collection Time    10/17/13  4:59 PM      Result Value Ref Range Status   Specimen Description URINE, CATHETERIZED   Final   Special Requests Normal   Final   Culture  Setup Time     Final   Value: 10/18/2013 02:13     Performed at [facility] Count     Final   Value: NO GROWTH     Performed at [facility]   Culture     Final  Value: NO GROWTH     Performed at [facility]   Report Status 10/18/2013 FINAL   Final  CULTURE, BLOOD (ROUTINE X 2)     Status: None   Collection Time    10/17/13  5:00 PM      Result Value Ref Range Status   Specimen Description BLOOD RIGHT RIGHT ANTECUBITAL   Final   Special Requests BOTTLES DRAWN AEROBIC AND ANAEROBIC 10CC   Final   Culture  Setup Time     Final   Value: 10/17/2013 18:56     Performed at [facility]   Culture     Final   Value:         BLOOD CULTURE RECEIVED NO GROWTH TO DATE CULTURE WILL BE HELD FOR 5 DAYS BEFORE ISSUING A FINAL NEGATIVE REPORT     Performed at [facility]   Report Status PENDING   Incomplete  CULTURE, RESPIRATORY (NON-EXPECTORATED)     Status: None   Collection Time    10/17/13  5:00 PM      Result Value Ref Range Status   Specimen Description TRACHEAL ASPIRATE   Final   Special Requests NONE   Final   Gram Stain     Final   Value: MODERATE WBC PRESENT, PREDOMINANTLY PMN     NO SQUAMOUS EPITHELIAL CELLS SEEN     NO ORGANISMS SEEN     Performed at [facility]   Culture     Final   Value: Non-Pathogenic Oropharyngeal-type Flora Isolated.     Performed at [facility]   Report Status 10/19/2013 FINAL   Final  CULTURE, BLOOD (ROUTINE X 2)     Status: None   Collection Time    10/17/13  5:05 PM      Result Value Ref Range Status   Specimen Description BLOOD RIGHT ARM   Final   Special Requests BOTTLES DRAWN AEROBIC AND ANAEROBIC 10CC   Final   Culture  Setup Time     Final   Value: 10/17/2013 18:56     Performed at [facility]   Culture     Final   Value:        BLOOD CULTURE RECEIVED NO GROWTH TO DATE CULTURE WILL BE HELD FOR 5 DAYS BEFORE ISSUING A FINAL NEGATIVE REPORT     Performed at [facility]   Report Status PENDING   Incomplete  CULTURE, RESPIRATORY (NON-EXPECTORATED)     Status: None   Collection Time    10/19/13  5:53 PM      Result Value Ref Range Status   Specimen Description TRACHEAL ASPIRATE   Final   Special Requests NONE   Final   Gram Stain     Final   Value: ABUNDANT WBC PRESENT,BOTH PMN AND MONONUCLEAR     NO SQUAMOUS EPITHELIAL CELLS SEEN     NO ORGANISMS SEEN     Performed at [facility]   Culture     Final   Value: NO GROWTH     Performed at [facility]   Report Status PENDING   Incomplete  CULTURE, BLOOD (ROUTINE X 2)     Status: None   Collection Time    10/19/13  5:55 PM      Result Value Ref Range  Status   Specimen Description BLOOD RIGHT ARM   Final   Special Requests BOTTLES DRAWN AEROBIC AND ANAEROBIC 8CC   Final   Culture  Setup Time     Final   Value: 10/19/2013 21:55     [facility]   Culture     Final   Value:        BLOOD CULTURE RECEIVED NO GROWTH TO DATE CULTURE WILL BE HELD FOR 5 DAYS BEFORE ISSUING A FINAL NEGATIVE REPORT     [facility]   Report Status PENDING   Incomplete  CULTURE, BLOOD (ROUTINE X 2)     Status: None   Collection Time    10/19/13  6:00 PM      Result Value Ref Range Status   Specimen Description BLOOD RIGHT ARM   Final   Special Requests BOTTLES DRAWN AEROBIC ONLY 2CC   Final   Culture  Setup Time     Final   Value: 10/19/2013 21:55     [facility]   Culture     Final   Value:        BLOOD CULTURE RECEIVED NO GROWTH TO DATE CULTURE WILL BE HELD FOR 5 DAYS BEFORE ISSUING A FINAL NEGATIVE REPORT     [facility]   Report Status PENDING   Incomplete  CLOSTRIDIUM DIFFICILE BY PCR     Status: None   Collection Time    10/20/13  6:10 AM      Result Value Ref Range Status   C difficile by pcr NEGATIVE  NEGATIVE Final   Comment: [facility]    Studies/Results: Dg Chest Port 1 View  10/20/2013   CLINICAL DATA:  Evaluate endotracheal tube positioning  EXAM: PORTABLE CHEST - 1 VIEW  COMPARISON:  DG CHEST 1V PORT dated 10/19/2013; DG CHEST 1V PORT dated 10/17/2013; DG CHEST 1V PORT dated 10/13/2013; DG CHEST 1V PORT dated 10/10/2013; DG CHEST 1V PORT dated 10/06/2013; CT CHEST W/CM dated 10/06/2013  FINDINGS: Grossly unchanged cardiac silhouette and mediastinal contours. Stable position of support apparatus. Minimally improved aeration of the lungs with persistent extensive bilateral granular interstitial opacities with relative areas of consolidation within the left upper and right mid lungs. No new focal airspace opacities. Trace right-sided effusion is not  excluded. A pneumothorax. Grossly unchanged bones including advanced degenerative change of the right glenohumeral joint.  IMPRESSION: 1.  Stable positioning of support apparatus.  No pneumothorax. 2. Minimally improved aeration of the lungs suggests resolving edema and/or atelectasis. 3. Persistent bilateral granular interstitial opacities with areas of consolidation within the right mid and left lower lung with differential considerations including but not limited to residual multifocal infection/ARDS and asymmetric pulmonary edema.   Electronically Signed   By: Simonne ComeJohn  Watts M.D.   On: 10/20/2013 07:47   Dg Chest Port 1 View  10/19/2013   CLINICAL DATA:  Endotracheal position  EXAM: PORTABLE CHEST - 1 VIEW  COMPARISON:  10/18/2013  FINDINGS: Endotracheal tube has its tip 3 cm above the carina. Nasogastric tube enters stone eye. Left arm take has its tip in the SVC above the right atrium. Diffuse pulmonary space density persists, possibly with minimal improvement in the right upper lobe but no change in the remainder of the chest. No pleural fluid accumulation. No pneumothorax.  IMPRESSION: Lines and tubes well positioned. Diffuse pulmonary density persists, possibly with slight improvement in the right upper lobe.   Electronically Signed   By: Paulina FusiMark  Shogry M.D.   On: 10/19/2013 07:27    Assessment: His oxygen requirement has improved in the past 24  hours, most likely due to the increase in his steroids. No new infection has been found.  Plan: 1. Continue current antimicrobial therapy and steroids 2. Await final cultures 3. Agree with tracheostomy tomorrow  Cliffton Asters, MD [facility] [phone] pager   [phone] cell 10/20/2013, 4:34 PM

## 2013-10-20 NOTE — Progress Notes (Signed)
CRITICAL VALUE ALERT  Critical value received:  CO2 41     Date of notification:  10/20/13  Time of notification:  0730  Critical value read back: yes  Nurse who received alert:  Allyne GeeSRussell, RN  MD notified (1st page):  Consistent with previous results, will handoff to MD at bedside  Time of first page:    MD notified (2nd page):  Time of second page:  Responding MD:    Time MD responded:

## 2013-10-20 NOTE — Progress Notes (Signed)
Chaplain met with family for support around decision making and continued care.  This chaplain meeting family for first time, but family has been followed closely by chaplain Lorrin Jackson.   Shoaib's wife, Clois Comber, states: "I want to give him a chance."  She feels that terminal wean would be giving up on an option for his condition to improve.  Raciel's father does not foresee his condition improving and worries for him to remain in this state.  His parents are supportive of Caryn's decision making and are deferring to her.  She and extended family are fearful of next steps and asking questions around skilled nursing facility care, rehabilitation options, payment, etc.  Advised family to be in conversation with case management and Social Work about these questions.  If they move forward with trach, they wish to inform children about severity of Isamar's illness.  Chaplain provided information around hospice [facility] and some material to assist family in informing children and supporting them through their grief.   Will continue to follow closely for support and inform primary chaplain as she returns to hospital tomorrow.   Please page with changes or as needs arise.   [place]

## 2013-10-20 NOTE — Progress Notes (Signed)
Pulmonary/Critical Care Progress Note    Name: Tennis MustBrent Saleeby MRN: [id number] DOB: 04/23/1966    ADMISSION DATE:  10/22/2013 CONSULTATION DATE:  10/19/2013  REFERRING MD :  Rancour  CHIEF COMPLAINT:  Shortness of breath, fever, cough  BRIEF PATIENT DESCRIPTION:  48 y/o male recently diagnosed with HIV/AIDS -CD4 =30 on 3/31 and treated empirically for PCP returned to the [facility] ED on 4/4 with acute hypoxemic respiratory failure. Note that sputum PCP was neg 3/18 but was on bactrim prior  SIGNIFICANT EVENTS: 4/09 Pneumomediastinum, nimbex added 4/10 desat, lasix started, peep improved to 10   4/13 ETT had migrated, re-advanced and verified w/ FOB. Obtained BAL at this time  4/16 Tm 101F 4/17 1 unit PRBC for Hb 6.8 4/18 Yeast in blood >> added micafungin 4/19 Tolerating pressure support  STUDIES:  4/4 CT angio > no PE, diffuse bilateral GGO and patchy consolidation worse in the bases L > R 4/9 CT chest > pneumomediastinum with subcutaneous emphysema, severe diffuse GGO b/l 4/15 RUE US> NEGATIVE 4/18 TTE > mild LVH, EF 55 to 60%  LINES / TUBES: 4/5 ETT >>4/13 (exchanged)>>> 4/5 R IJ CVL >> 4/17 4/17 Lt PICC >>   CULTURES (truncated 4/18): 4/4 CMV pcr >> POSITIVE 4/5 fungal  bal >> 4/5 afb  bal >> 4/11 resp virus panel: + parainfluenza 2 4/11 AFB blood cx >>> 4/13 fungal BAL: candida albicans>>> 4/13 CMV PCR >>> 101,862 copies/ml 4/13 fungitell >>> 4/16 Blood >>> Yeast in one of two >>> 4/16 Sputum >>>NPF 4/16 Urine >>> negative  ANTIBIOTICS: 4/4 Bactrim >>> 4/4 Vanc >>> 4/4 4/4 Cefepime >>> 4/14 4/6 Gancyclovir >>> 4/16 Vanc >>> 4/18 4/16 cefepime >>> 4/18 4/17 Micafungin >>>4/20 4/19 Zithromax>>> HIV ARV>>>  SUBJECTIVE:  Remains on high PEEP and FiO2, unchanged, febrile overnight.  VITAL SIGNS: Temp:  [98.2 F (36.8 C)-99.6 F (37.6 C)] 99.6 F (37.6 C) (04/23 0400) Pulse Rate:  [104-123] 115 (04/23 0700) Resp:  [8-21] 18 (04/23 0700) BP: (97-134)/(59-88)  132/75 mmHg (04/23 0700) SpO2:  [92 %-99 %] 96 % (04/23 0700) FiO2 (%):  [60 %-80 %] 60 % (04/23 0753) Weight:  [152 lb 12.5 oz (69.3 kg)] 152 lb 12.5 oz (69.3 kg) (04/23 0500)  VENTILATOR SETTINGS: Vent Mode:  [-] PCV FiO2 (%):  [60 %-80 %] 60 % Set Rate:  [18 bmp] 18 bmp PEEP:  [12 cmH20] 12 cmH20 Plateau Pressure:  [25 cmH20-34 cmH20] 25 cmH20  INTAKE / OUTPUT:  Intake/Output Summary (Last 24 hours) at 10/20/13 0836 Last data filed at 10/20/13 0700  Gross per 24 hour  Intake   3680 ml  Output   4250 ml  Net   -570 ml    PHYSICAL EXAMINATION: Gen: Sedated and intubated. HEENT: ETT in place. PULM: Scattered rhonchi, diffuse rales. CV: regular, tachycardic. AB: soft, non tender. Neuro: RASS -2. Ext: 1+ edema. Derm: sacral decub noted.  LABS: PULMONARY  Recent Labs Lab 10/18/13 0456 10/19/13 0436 10/20/13 0435  PHART 7.447 7.408 7.384  PCO2ART 46.2* 63.7* 76.1*  PO2ART 55.5* 76.2* 104.0*  HCO3 31.3* 39.2* 44.1*  TCO2 29.8 36.3 41.1  O2SAT 85.8 93.0 97.1   CBC  Recent Labs Lab 10/18/13 0530 10/19/13 0530 10/20/13 0535  HGB 7.7* 9.9* 9.4*  HCT 21.0* 30.8* 29.6*  WBC 8.1 12.2* 10.6*  PLT 195 232 247   COAGULATION  Recent Labs Lab 10/17/13 0620  INR 1.13   CHEMISTRY  Recent Labs Lab 10/14/13 0345  10/16/13 0430 10/17/13 0620 10/18/13 0530 10/19/13  0530 10/20/13 0535  NA 132*  < > 133* 136* 129* 135* 141  K 3.6*  < > 3.8 3.4* 3.6* 3.2* 3.8  CL 89*  < > 91* 96 89* 85* 90*  CO2 36*  < > 34* 29 27 40* 41*  GLUCOSE 132*  < > 99 128* 133* 120* 186*  BUN 21  < > 22 19 18 21  37*  CREATININE 0.56  < > 0.55 0.48* 0.42* 0.53 0.59  CALCIUM 7.6*  < > 7.8* 7.5* 7.1* 8.1* 8.1*  MG 2.0  --   --   --  1.6 1.9 2.4  PHOS 4.1  --   --   --  3.5 4.8* 4.2  < > = values in this interval not displayed. Estimated Creatinine Clearance: 111.9 ml/min (by C-G formula based on Cr of 0.59).  LIVER  Recent Labs Lab 10/17/13 0620 10/18/13 0530  AST  --  35   ALT  --  34  ALKPHOS  --  101  BILITOT  --  <0.2*  PROT  --  4.4*  ALBUMIN  --  1.2*  INR 1.13  --    INFECTIOUS No results found for this basename: LATICACIDVEN, PROCALCITON,  in the last 168 hours ENDOCRINE CBG (last 3)   Recent Labs  10/19/13 1954 10/19/13 2334 10/20/13 0325  GLUCAP 172* 184* 186*   IMAGING x48h  Dg Chest Port 1 View  10/20/2013   CLINICAL DATA:  Evaluate endotracheal tube positioning  EXAM: PORTABLE CHEST - 1 VIEW  COMPARISON:  DG CHEST 1V PORT dated 10/19/2013; DG CHEST 1V PORT dated 10/17/2013; DG CHEST 1V PORT dated 10/13/2013; DG CHEST 1V PORT dated 10/10/2013; DG CHEST 1V PORT dated 10/06/2013; CT CHEST W/CM dated 10/06/2013  FINDINGS: Grossly unchanged cardiac silhouette and mediastinal contours. Stable position of support apparatus. Minimally improved aeration of the lungs with persistent extensive bilateral granular interstitial opacities with relative areas of consolidation within the left upper and right mid lungs. No new focal airspace opacities. Trace right-sided effusion is not excluded. A pneumothorax. Grossly unchanged bones including advanced degenerative change of the right glenohumeral joint.  IMPRESSION: 1.  Stable positioning of support apparatus.  No pneumothorax. 2. Minimally improved aeration of the lungs suggests resolving edema and/or atelectasis. 3. Persistent bilateral granular interstitial opacities with areas of consolidation within the right mid and left lower lung with differential considerations including but not limited to residual multifocal infection/ARDS and asymmetric pulmonary edema.   Electronically Signed   By: Simonne ComeJohn  Watts M.D.   On: 10/20/2013 07:47   Dg Chest Port 1 View  10/19/2013   CLINICAL DATA:  Endotracheal position  EXAM: PORTABLE CHEST - 1 VIEW  COMPARISON:  10/18/2013  FINDINGS: Endotracheal tube has its tip 3 cm above the carina. Nasogastric tube enters stone eye. Left arm take has its tip in the SVC above the right atrium.  Diffuse pulmonary space density persists, possibly with minimal improvement in the right upper lobe but no change in the remainder of the chest. No pleural fluid accumulation. No pneumothorax.  IMPRESSION: Lines and tubes well positioned. Diffuse pulmonary density persists, possibly with slight improvement in the right upper lobe.   Electronically Signed   By: Paulina FusiMark  Shogry M.D.   On: 10/19/2013 07:27  no sig improvement   ASSESSMENT / PLAN:  PULMONARY A:  Acute respiratory failure with ARDS 2nd to Parainfluenza PNA and presumed PCP  in setting of HIV. Pneumomediastinum developed 4/09 >> resolved. ETT exchanged 4/13 d/t  leak. Increased Oxygen needs 4/16 >> better 4/17. >little improvement radiographically over weekend. Has positive volume balance.  P:   - F/u CXR in AM. - Decrease PEEP to 10 and FiO2 to 50%. - Increase steroids to 125 mg IV q6 then reduce back to 20 q12 after 24 hours. - Will speak with family today regarding plan of care. - Hold PS trials with high FiO2 and PEEP. - Lasix as below.  INFECTIOUS A:   PNA in setting of HIV >> viral pneumonitis (+ parainfluenza 2) +/-bacterial, PCP. Stage II buttock ulcers. CMV viremia. His parainfluenza 2 was positive >> clinically Significant with ARDS. Fever 101F on 4/16 >> Fungemia from 4/16; TTE negative for vegetation. P:   - Continue to bactrim, zithromax, gancyclovir and micafungin. - Day 7 of micafungin. - Continue bactrim (presumed PCP), ganciclovir (CMV) per ID. - Continue zithromax, diflucan for prophylaxis. - Continue anti-retroviral tx (truvada) per ID. - Wound care consulted to assess sacral decub.  CARDIOVASCULAR A:  Severe sepsis >> hemodynamics improved. Hx of HTN. P:  - Monitor hemodynamics. - Will hold ASA on 4/19 in anticipation of tracheostomy.  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 Now Na trending up Hypokalemia  P:   - Lasix drip 10 mg/hr x 24 hours. - Zaroxolyn x1 dose. - Acetazolamide 250 x3  doses. - Contine free water. - Replace electrolytes a indicated. - BMET in AM.  GASTROINTESTINAL A:   Protein calorie malnutrition. P:   - Protonix for SUP. - Tube feeds to goal.  HEMATOLOGIC A:   Anemia of critical illness and chronic disease >> no evidence for bleeding. RUE swelling >> doppler negative from 4/15. P:  - F/u CBC. - Continue LMWH for DVT prevention. - Hold anti-coag when ready for trach, not today.  ENDOCRINE A:   Steroid induced hyperglycemia. P:   - SSI.  NEUROLOGIC A:    Anxiety, pain control, sedation >> changed to dilaudid 4/14. Trig 1500 4/21. P:   - D/C propofol. - Continue dilaudid. - Versed drip.  Upon family's arrival will discuss whether they would wish for a trach/peg today.  CC time 35 min.  Alyson Reedy, M.D. [facility] Pulmonary/Critical Care Medicine. Pager: [phone]. After hours pager: [phone].

## 2013-10-21 ENCOUNTER — Inpatient Hospital Stay (HOSPITAL_COMMUNITY): Payer: BC Managed Care – PPO

## 2013-10-21 DIAGNOSIS — J9383 Other pneumothorax: Secondary | ICD-10-CM

## 2013-10-21 LAB — BLOOD GAS, ARTERIAL
ACID-BASE EXCESS: 12.5 mmol/L — AB (ref 0.0–2.0)
BICARBONATE: 38.7 meq/L — AB (ref 20.0–24.0)
Drawn by: 308601
FIO2: 0.5 %
O2 SAT: 96.1 %
PCO2 ART: 67.6 mmHg — AB (ref 35.0–45.0)
PEEP: 10 cmH2O
PO2 ART: 95.1 mmHg (ref 80.0–100.0)
PRESSURE CONTROL: 18 cmH2O
Patient temperature: 102.2
RATE: 18 resp/min
TCO2: 36 mmol/L (ref 0–100)
pH, Arterial: 7.386 (ref 7.350–7.450)

## 2013-10-21 LAB — BASIC METABOLIC PANEL
BUN: 64 mg/dL — ABNORMAL HIGH (ref 6–23)
CO2: 37 mEq/L — ABNORMAL HIGH (ref 19–32)
Calcium: 8 mg/dL — ABNORMAL LOW (ref 8.4–10.5)
Chloride: 93 mEq/L — ABNORMAL LOW (ref 96–112)
Creatinine, Ser: 1.01 mg/dL (ref 0.50–1.35)
GFR calc non Af Amer: 87 mL/min — ABNORMAL LOW (ref >90)
Glucose, Bld: 226 mg/dL — ABNORMAL HIGH (ref 70–99)
POTASSIUM: 3.4 meq/L — AB (ref 3.7–5.3)
Sodium: 142 mEq/L (ref 137–147)

## 2013-10-21 LAB — CBC
HCT: 30.2 % — ABNORMAL LOW (ref 39.0–52.0)
Hemoglobin: 9.5 g/dL — ABNORMAL LOW (ref 13.0–17.0)
MCH: 29.6 pg (ref 26.0–34.0)
MCHC: 31.5 g/dL (ref 30.0–36.0)
MCV: 94.1 fL (ref 78.0–100.0)
PLATELETS: 290 10*3/uL (ref 150–400)
RBC: 3.21 MIL/uL — ABNORMAL LOW (ref 4.22–5.81)
RDW: 16.1 % — AB (ref 11.5–15.5)
WBC: 11.5 10*3/uL — ABNORMAL HIGH (ref 4.0–10.5)

## 2013-10-21 LAB — GLUCOSE, CAPILLARY
GLUCOSE-CAPILLARY: 202 mg/dL — AB (ref 70–99)
Glucose-Capillary: 211 mg/dL — ABNORMAL HIGH (ref 70–99)
Glucose-Capillary: 187 mg/dL — ABNORMAL HIGH (ref 70–99)
Glucose-Capillary: 177 mg/dL — ABNORMAL HIGH (ref 70–99)
Glucose-Capillary: 199 mg/dL — ABNORMAL HIGH (ref 70–99)

## 2013-10-21 LAB — VANCOMYCIN, TROUGH: Vancomycin Tr: 48.4 ug/mL (ref 10.0–20.0)

## 2013-10-21 LAB — MAGNESIUM: MAGNESIUM: 2.9 mg/dL — AB (ref 1.5–2.5)

## 2013-10-21 LAB — PHOSPHORUS: Phosphorus: 5 mg/dL — ABNORMAL HIGH (ref 2.3–4.6)

## 2013-10-21 LAB — TRIGLYCERIDES: Triglycerides: 209 mg/dL — ABNORMAL HIGH (ref <150)

## 2013-10-21 MED ORDER — ACETAZOLAMIDE SODIUM 500 MG IJ SOLR
250.0000 mg | Freq: Four times a day (QID) | INTRAMUSCULAR | Status: AC
  Administered 2013-10-21 (×3): 250 mg via INTRAVENOUS
  Filled 2013-10-21 (×4): qty 500

## 2013-10-21 MED ORDER — FUROSEMIDE 10 MG/ML IJ SOLN
40.0000 mg | Freq: Four times a day (QID) | INTRAMUSCULAR | Status: AC
  Administered 2013-10-21 (×3): 40 mg via INTRAVENOUS
  Filled 2013-10-21 (×3): qty 4

## 2013-10-21 MED ORDER — FLUCONAZOLE IN SODIUM CHLORIDE 400-0.9 MG/200ML-% IV SOLN
400.0000 mg | INTRAVENOUS | Status: DC
  Administered 2013-10-21 – 2013-10-31 (×11): 400 mg via INTRAVENOUS
  Filled 2013-10-21 (×11): qty 200

## 2013-10-21 MED ORDER — POTASSIUM CHLORIDE 20 MEQ/15ML (10%) PO LIQD
40.0000 meq | Freq: Three times a day (TID) | ORAL | Status: AC
  Administered 2013-10-21 (×3): 40 meq
  Filled 2013-10-21 (×3): qty 30

## 2013-10-21 MED ORDER — MIDAZOLAM HCL 2 MG/2ML IJ SOLN
INTRAMUSCULAR | Status: AC
  Filled 2013-10-21: qty 4

## 2013-10-21 MED ORDER — VANCOMYCIN HCL IN DEXTROSE 750-5 MG/150ML-% IV SOLN: 750.0000 mg | Freq: Two times a day (BID) | INTRAVENOUS | Status: DC

## 2013-10-21 MED ORDER — SODIUM CHLORIDE 0.9 % IV SOLN
100.0000 mg | Freq: Every day | INTRAVENOUS | Status: DC
  Filled 2013-10-21: qty 100

## 2013-10-21 MED ORDER — SODIUM CHLORIDE 0.9 % IV SOLN
INTRAVENOUS | Status: DC
  Administered 2013-10-21 – 2013-10-22 (×3): via INTRAVENOUS
  Filled 2013-10-21 (×13): qty 80

## 2013-10-21 MED ORDER — POTASSIUM CHLORIDE 20 MEQ/15ML (10%) PO LIQD
20.0000 meq | ORAL | Status: AC
  Administered 2013-10-21: 20 meq
  Filled 2013-10-21 (×2): qty 15

## 2013-10-21 NOTE — Procedures (Signed)
Chest Tube Insertion Procedure Note  Indications:  Clinically significant Pneumothorax  Pre-operative Diagnosis: Pneumothorax  Post-operative Diagnosis: Pneumothorax  Procedure Details  Informed consent was obtained for the procedure, including sedation.  Risks of lung perforation, hemorrhage, arrhythmia, and adverse drug reaction were discussed.   After sterile skin prep, using standard technique, a 7 French tube was placed in the right lateral 4 rib space.  Findings: None  Estimated Blood Loss:  Minimal         Specimens:  None              Complications:  None; patient tolerated the procedure well.         Disposition: ICU - intubated and critically ill.         Condition: stable  Attending Attestation: I was present and scrubbed for the entire procedure.  Alyson ReedyWesam G. Yacoub, M.D. [facility] Pulmonary/Critical Care Medicine. Pager: [phone]. After hours pager: [phone].

## 2013-10-21 NOTE — Procedures (Signed)
Extubation Procedure Note  Patient Details:   Name: William Bender DOB: 02/11/1966 MRN: [id number]   Airway Documentation:     Evaluation  O2 sats: 100 Complications: none Patient tolerated procedure well. Bilateral Breath Sounds: clear  Suctioning: Airway, oral  Pt unable to speak. Extubated for trach placement.   William Bender 10/21/2013, 10:47 AM

## 2013-10-21 NOTE — Progress Notes (Signed)
ANTIBIOTIC CONSULT NOTE - FOLLOW UP  Pharmacy Consult for vancomycin/cefepime Indication: fever  Allergies  Allergen Reactions  . Penicillins Rash    Patient Measurements: Height: 5\' 10"  (177.8 cm) Weight: 152 lb 12.5 oz (69.3 kg) IBW/kg (Calculated) : 73 Adjusted Body Weight:   Vital Signs: Temp: 99.2 F (37.3 C) (04/24 1200) Temp src: Oral (04/24 1200) BP: 149/96 mmHg (04/24 1200) Pulse Rate: 111 (04/24 1200) Intake/Output from previous day: 04/23 0701 - 04/24 0700 In: 4196 [I.V.:1266; NG/GT:1700; IV Piggyback:1200] Out: 4125 [Urine:3825; Stool:300] Intake/Output from this shift: Total I/O In: 615 [I.V.:145; Other:120; IV Piggyback:350] Out: 1600 [Urine:1600]  Labs:  Recent Labs  10/19/13 0530 10/20/13 0535 10/21/13 0430  WBC 12.2* 10.6* 11.5*  HGB 9.9* 9.4* 9.5*  PLT 232 247 290  CREATININE 0.53 0.59 1.01   Estimated Creatinine Clearance: 88.6 ml/min (by C-G formula based on Cr of 1.01).  Recent Labs  10/21/13 1130  VANCOTROUGH 48.4*      Assessment: 47 yoM admitted 4/4 with tachycardia and SOB, concerning for ongoing/recurrent PCP pneumonia. PMH includes recent hospitalization (3/17- 09/20/13) with newly diagnosed HIV, started on ART (Stribild), and treatment for PCP pneumonia. At discharge, he was taking Bactrim, but prematurely decreased his dosage. Pharmacy initially consulted to dose Vancomycin, Cefepime, and Bactrim. ID consult recommended repeating an entire new course for PCP PNA. 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 Since then, antiinfectives narrowed to bactrim and ganciclovir, with micafungin added for fungemia.  On 4/22 vancomycin and cefepime added as patient febrile 4/21 and continues to have diffuse BL pulmonary infiltrates and increased O2 requirements.   4/4 >> Vanc >> 4/4 4/4 >> Cefepime >> 4/14 4/4 >> Bactrim IV >> 4/21 4/6 >> ganciclovir >> 4/14 >> Qweek fluconazole 100mg  >> 4/18 PTA >> Qweek azithro (MAC ppx)  >> 4/16 >> Vanc >> 4/18, 4/22 >> 4/16 >> Cefepime >> 4/18, 4/22 >> 4/18 >> micafungin >> 4/21>> Septra PO >>  Tmax: 99.4 WBCs: 12.2 Renal: SCr 1.01 (from 0.59), CrCl = 1992ml/min (N), 5589ml/min(C-G), excellent UOP  CD4: 30 (4/11), 30 (3/31) HIV RNA quant: 756 (4/11), 13K (3/31)  4/4 blood x2: NGF 4/4 Histoplasma Ag: negative 4/5 PCP by DFA: negative (on Bactrim x2 weeks prior to collection) 4/5 AFB: ngtd, pending, in progress for 6 weeks 4/5 Resp: NGF 4/5 Fungal: ngtd, pending, in progress for 4 weeks 4/5 CMV cx: negative 4/7 fungitell (B glucan test) (-) 4/8 CMV DNA plasma: 78k copies 4/9 cryptococcal Ag (-) 4/11 blood x2: ng-final 4/11 AFB: ngtd, pending, in progress for 6 weeks 4/11 resp virus panel: +parainfluenza 2 4/11 Quantiferon TB: indeterminate 4/13: CMV DNA: > 101,000 (high) 4/13 Fungus w smear (BAL): C.Albicans 4/13 fungal Ab: all negative 4/13 BAL: non-path oro flora - final 4/13 TB PCR: not detected 4/16 Blood: 1/2 candida albicans, 1/2 NGF 4/16 Urine: insig growth 4/20 Urine: NGF 4/20 Trach asp: normal flora 4/20 Blood x2: ngtd 4/22 Tracheal aspirate: normal flora 4/23 C. Difficile: neg 4/22 blood: NGTD  Drug level / dose changes info: 4/4: Dr. Daiva EvesVan Dam dc'd Vanc and changed Cefepime from 2g q8h to 1g q8h. 4/18: VT 12.7 on 1gm q8 (drawn 6h from previous dose)-now d/c 4/24: VT 48.4 on 1250mg  q8h (prior to 6th dose, drawn 6.5h from previous dose), change to 750mg  q12h   Goal of Therapy:  Vancomycin trough level 15-20 mcg/ml  Plan:   Vancomycin trough SUPRAtherapeutic, vanco trough 4/18 was 12.7 on 1gm IV q8h so level much higher than  anticipated.  Scr is WNL but has increased from 0.59 to 1.01 this am.  UOP excellent on IV furosemide.  Change vancomycin to 750mg  IV q12h for est new Pk = 29, trough = 16.8.  (next dose due tomorrow am to allow level to return to within desired range)  Will check random level in am to ensure level trending down as  expect  Continue Cefepime 1gm IV q8h  On Micafungin 150mg  IV daily for candidemia, but on higher dose than recommended for candidemia (on esophageal candidiasis dosing). Reduce micafungin to 100mg  daily for candidemia (blood cultures have remained sterile)  Juliette Alcideustin Zeigler, PharmD, BCPS.   Pager: [phone]  10/21/2013,1:02 PM

## 2013-10-21 NOTE — Progress Notes (Signed)
Pulmonary/Critical Care Progress Note    Name: William Bender MRN: [id number] DOB: 10/17/1965    ADMISSION DATE:  10/26/2013 CONSULTATION DATE:  10/20/2013  REFERRING MD :  Rancour  CHIEF COMPLAINT:  Shortness of breath, fever, cough  BRIEF PATIENT DESCRIPTION:  48 y/o male recently diagnosed with HIV/AIDS -CD4 =30 on 3/31 and treated empirically for PCP [facility] with acute hypoxemic respiratory failure. Note that sputum PCP was neg 3/18 but was on bactrim prior  SIGNIFICANT EVENTS: 4/09 Pneumomediastinum, nimbex added 4/10 desat, lasix started, peep improved to 10   4/13 ETT had migrated, re-advanced and verified w/ FOB. Obtained BAL at this time  4/16 Tm 101F 4/17 1 unit PRBC for Hb 6.8 4/18 Yeast in blood >> added micafungin 4/19 Tolerating pressure support  STUDIES:  4/4 CT angio > no PE, diffuse bilateral GGO and patchy consolidation worse in the bases L > R 4/9 CT chest > pneumomediastinum with subcutaneous emphysema, severe diffuse GGO b/l 4/15 RUE US> NEGATIVE 4/18 TTE > mild LVH, EF 55 to 60%  LINES / TUBES: 4/5 ETT >>4/13 (exchanged)>>> 4/5 R IJ CVL >> 4/17 4/17 Lt PICC >>   CULTURES (truncated 4/18): 4/4 CMV pcr >> POSITIVE 4/5 fungal  bal >> 4/5 afb  bal >> 4/11 resp virus panel: + parainfluenza 2 4/11 AFB blood cx >>> 4/13 fungal BAL: candida albicans>>> 4/13 CMV PCR >>> 101,862 copies/ml 4/13 fungitell >>> 4/16 Blood >>> Yeast in one of two >>> 4/16 Sputum >>>NPF 4/16 Urine >>> negative  ANTIBIOTICS: 4/4 Bactrim >>> 4/4 Vanc >>> 4/4 4/4 Cefepime >>> 4/14>>>4/23>>> 4/6 Gancyclovir >>> 4/16 Vanc >>> 4/18>>>4/23>>> 4/16 cefepime >>> 4/18 4/17 Micafungin >>>4/20 4/19 Zithromax>>> HIV ARV>>>  SUBJECTIVE:  PEEP at 10 and FiO2 down to 40%.  VITAL SIGNS: Temp:  [98.4 F (36.9 C)-102.2 F (39 C)] 98.7 F (37.1 C) (04/24 0600) Pulse Rate:  [121-132] 122 (04/24 0430) Resp:  [16-19] 18 (04/24 0600) BP: (112-144)/(66-96) 130/91  mmHg (04/24 0600) SpO2:  [93 %-99 %] 99 % (04/24 0430) FiO2 (%):  [40 %-50 %] 40 % (04/24 0735) Weight:  [152 lb 12.5 oz (69.3 kg)] 152 lb 12.5 oz (69.3 kg) (04/24 0500)  VENTILATOR SETTINGS: Vent Mode:  [-] PCV FiO2 (%):  [40 %-50 %] 40 % Set Rate:  [18 bmp] 18 bmp PEEP:  [10 cmH20] 10 cmH20 Plateau Pressure:  [24 cmH20-31 cmH20] 29 cmH20  INTAKE / OUTPUT:  Intake/Output Summary (Last 24 hours) at 10/21/13 0839 Last data filed at 10/21/13 0700  Gross per 24 hour  Intake   3962 ml  Output   3775 ml  Net    187 ml   PHYSICAL EXAMINATION: Gen: Sedated and intubated. HEENT: ETT in place. PULM: Scattered rhonchi, diffuse rales. CV: regular, tachycardic. AB: soft, non tender. Neuro: RASS -2. Ext: 1+ edema. Derm: sacral decub noted.  LABS: PULMONARY  Recent Labs Lab 10/18/13 0456 10/19/13 0436 10/20/13 0435 10/21/13 0415  PHART 7.447 7.408 7.384 7.386  PCO2ART 46.2* 63.7* 76.1* 67.6*  PO2ART 55.5* 76.2* 104.0* 95.1  HCO3 31.3* 39.2* 44.1* 38.7*  TCO2 29.8 36.3 41.1 36.0  O2SAT 85.8 93.0 97.1 96.1   CBC  Recent Labs Lab 10/19/13 0530 10/20/13 0535 10/21/13 0430  HGB 9.9* 9.4* 9.5*  HCT 30.8* 29.6* 30.2*  WBC 12.2* 10.6* 11.5*  PLT 232 247 290   COAGULATION  Recent Labs Lab 10/17/13 0620  INR 1.13   CHEMISTRY  Recent Labs Lab 10/17/13 0620 10/18/13  0530 10/19/13 0530 10/20/13 0535 10/21/13 0430  NA 136* 129* 135* 141 142  K 3.4* 3.6* 3.2* 3.8 3.4*  CL 96 89* 85* 90* 93*  CO2 29 27 40* 41* 37*  GLUCOSE 128* 133* 120* 186* 226*  BUN 19 18 21  37* 64*  CREATININE 0.48* 0.42* 0.53 0.59 1.01  CALCIUM 7.5* 7.1* 8.1* 8.1* 8.0*  MG  --  1.6 1.9 2.4 2.9*  PHOS  --  3.5 4.8* 4.2 5.0*   Estimated Creatinine Clearance: 88.6 ml/min (by C-G formula based on Cr of 1.01).  LIVER  Recent Labs Lab 10/17/13 0620 10/18/13 0530  AST  --  35  ALT  --  34  ALKPHOS  --  101  BILITOT  --  <0.2*  PROT  --  4.4*  ALBUMIN  --  1.2*  INR 1.13  --     INFECTIOUS No results found for this basename: LATICACIDVEN, PROCALCITON,  in the last 168 hours ENDOCRINE CBG (last 3)   Recent Labs  10/20/13 2320 10/21/13 0443 10/21/13 0728  GLUCAP 193* 211* 187*   IMAGING x48h  Dg Chest Port 1 View  10/21/2013   CLINICAL DATA:  Check endotracheal tube position  EXAM: PORTABLE CHEST - 1 VIEW  COMPARISON:  10/20/2013  FINDINGS: Endotracheal tube is noted 4.3 cm above the carina. A left-sided PICC line is seen in the distal superior vena cava. A nasogastric catheter is noted within stomach. Diffuse infiltrative changes are again identified bilaterally. Is small pneumothorax is noted in the right lung base laterally. No other pneumothorax is seen. No acute bony abnormality is noted.  IMPRESSION: New right basilar pneumothorax.  The remainder of the exam is stable from the prior study.  Critical Value/emergent results were called by telephone at the time of interpretation on 10/21/2013 at 7:00 AM to Tiffany the patient's nurse, who verbally acknowledged these results.   Electronically Signed   By: Alcide CleverMark  Lukens M.D.   On: 10/21/2013 07:04   Dg Chest Port 1 View  10/20/2013   CLINICAL DATA:  Evaluate endotracheal tube positioning  EXAM: PORTABLE CHEST - 1 VIEW  COMPARISON:  DG CHEST 1V PORT dated 10/19/2013; DG CHEST 1V PORT dated 10/17/2013; DG CHEST 1V PORT dated 10/13/2013; DG CHEST 1V PORT dated 10/10/2013; DG CHEST 1V PORT dated 10/06/2013; CT CHEST W/CM dated 10/06/2013  FINDINGS: Grossly unchanged cardiac silhouette and mediastinal contours. Stable position of support apparatus. Minimally improved aeration of the lungs with persistent extensive bilateral granular interstitial opacities with relative areas of consolidation within the left upper and right mid lungs. No new focal airspace opacities. Trace right-sided effusion is not excluded. A pneumothorax. Grossly unchanged bones including advanced degenerative change of the right glenohumeral joint.  IMPRESSION: 1.   Stable positioning of support apparatus.  No pneumothorax. 2. Minimally improved aeration of the lungs suggests resolving edema and/or atelectasis. 3. Persistent bilateral granular interstitial opacities with areas of consolidation within the right mid and left lower lung with differential considerations including but not limited to residual multifocal infection/ARDS and asymmetric pulmonary edema.   Electronically Signed   By: Simonne ComeJohn  Watts M.D.   On: 10/20/2013 07:47  no sig improvement   ASSESSMENT / PLAN:  PULMONARY A:  Acute respiratory failure with ARDS 2nd to Parainfluenza PNA and presumed PCP  in setting of HIV. Pneumomediastinum developed 4/09 >> resolved. ETT exchanged 4/13 d/t leak. Increased Oxygen needs 4/16 >> better 4/17. >Expanding PTX on the right base.  P:   - F/u  CXR in AM. - Decrease PEEP to 10 and FiO2 to 40%. - Increase steroids to 125 mg IV q6 until 4/24 doses then decrease to 20 mg IV q8 on 4/25. - Trach today at 9:30 and right sided chest tube now. - Hold PS trials with high FiO2 and PEEP. - Change lasix to pushes.  INFECTIOUS A:   PNA in setting of HIV >> viral pneumonitis (+ parainfluenza 2) +/-bacterial, PCP. Stage II buttock ulcers. CMV viremia. His parainfluenza 2 was positive >> clinically Significant with ARDS. Fever 101F on 4/16 >> Fungemia from 4/16; TTE negative for vegetation. P:   - Continue to bactrim, zithromax, gancyclovir and micafungin. - Cefepime and vancomycin added by ID 4/23. - Day 8 of micafungin. - Continue bactrim (presumed PCP), ganciclovir (CMV) per ID. - Continue zithromax, diflucan for prophylaxis. - Continue anti-retroviral tx per ID. - Wound care consulted to assess sacral decub.  CARDIOVASCULAR A:  Severe sepsis >> hemodynamics improved. Hx of HTN. P:  - Monitor hemodynamics. - Will hold ASA on 4/19 in anticipation of tracheostomy.  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 Now Na trending up Hypokalemia  P:   -  Lasix 40 mg IV q6 x3 doses. - D/C zaroxolyn. - Acetazolamide 250 x3 doses. - Contine free water. - Replace electrolytes a indicated. - BMET in AM.  GASTROINTESTINAL A:   Protein calorie malnutrition. P:   - Protonix for SUP. - Tube feeds to goal, restart post trach after placement of NGT.  HEMATOLOGIC A:   Anemia of critical illness and chronic disease >> no evidence for bleeding. RUE swelling >> doppler negative from 4/15. P:  - F/u CBC. - Continue LMWH for DVT prevention. - Hold anti-coag for trach today then may restart post trach.  ENDOCRINE A:   Steroid induced hyperglycemia. P:   - SSI.  NEUROLOGIC A:    Anxiety, pain control, sedation >> changed to dilaudid 4/14. Trig 1500 4/21. P:   - D/Ced propofol. - Continue dilaudid. - Versed drip. - Hope is that post trach we will be able to decrease sedation since ETT will be out.  Updated family today, will proceed with chest tube then trach placement today.  PEG early next week.  CC time 35 min.  Alyson Reedy, M.D. [facility] Pulmonary/Critical Care Medicine. Pager: [phone]. After hours pager: [phone].

## 2013-10-21 NOTE — Procedures (Signed)
Chest Tube Insertion Procedure Note  Indications:  Clinically significant Pneumothorax  Pre-operative Diagnosis: Pneumothorax  Post-operative Diagnosis: Pneumothorax  Procedure Details  Informed consent was obtained for the procedure, including sedation.  Risks of lung perforation, hemorrhage, arrhythmia, and adverse drug reaction were discussed.   After sterile skin prep, using standard technique, a 20 French tube was placed in the right anterior 5th rib space.  Findings: Immediate rush of air. Transient air leak appreciated from sahara system   Estimated Blood Loss:  Minimal         Specimens:  None              Complications:  None; patient tolerated the procedure well.         Disposition: ICU - intubated and hemodynamically stable.         Condition: stable  Attending Attestation: I was present and scrubbed for the key portions of the procedure.  Alyson ReedyWesam G. Mykah Bellomo, M.D. [facility]. Pager: [phone]. After hours pager: [phone].

## 2013-10-21 NOTE — Progress Notes (Signed)
CRITICAL VALUE ALERT  Critical value received:  vanc trough 48.4  Date of notification:  10/21/13  Time of notification:  12:55  Critical value read back:yes  Nurse who received alert:  Gerlean RenShae Lee Doreene Forrey, RN  MD notified (1st page):  Notified pharmicist  Time of first page:  12:55  MD notified (2nd page):  Time of second page:  Responding MD: pharmicist  Time MD responded:  12:55

## 2013-10-21 NOTE — Procedures (Signed)
Percutaneous Tracheostomy Placement  Consent from wife, patient sedated, paralyzed and positioned.  Area cleaned, lidocaine/epi injected, skin incision done followed by blunt dissection, airway entered and visualized bronchscopically, wire placed and visualized.  Airway crushed and dilated.  Size 6 cuffed shiley trach placed and sutured.  Scope placed through trach and was well above carina.  Patient tolerated the procedure well without complications.  CXR ordered and pending.  Alyson ReedyWesam G. Yacoub, M.D. [facility]. Pager: [phone]. After hours pager: [phone].

## 2013-10-21 NOTE — Procedures (Signed)
PCCM Bronchoscopy Procedure Note  The patient was informed of the risks (including but not limited to bleeding, infection, respiratory failure, lung injury, tooth/oral injury) and benefits of the procedure and gave consent, see chart.  Indication: assistance for tracheostomy placement  [facility] 1231  Condition pre procedure: critically ill on vent  Medications for procedure: versed gtt, dilaudid gtt, etomidate 10mg , vecuronium 8mg   Procedure description: The bronchoscope was introduced through the endotracheal tube and passed to the level of the main carina.  Effort was made to minimize scope time in airway due to severe ARDS, so the scope was not passed beyond the main carina  Airway exam revealed normal appearing mucosa.  It was used to guide the percutaneous tracheostomy.  Procedures performed: none  Specimens sent: none  Condition post procedure: critically ill on vent  EBL:  None  Complications: none  Heber CarolinaBrent McQuaid, MD [facility]M Pager: [phone] Cell: [phone] If no response, call [phone]

## 2013-10-21 NOTE — Progress Notes (Signed)
Trach Team Note From chart review, pt with trach placed today and is on vent.  No SLP needs at this time. Thanks. Donavan Burnetamara Danyael Alipio, MS [facility] S[facility] [phone]

## 2013-10-21 NOTE — Progress Notes (Signed)
Patient ID: William Bender, male   DOB: 1966/03/05, 48 y.o.   MRN: [id number]         [facility]    Date of Admission:  10/02/2013                   Day 19 ganciclovir        Day 7 micafungin        Day 3 vancomycin        Day 3 cefepime Principal Problem:   Pneumonia Active Problems:   Acute respiratory failure   AIDS   PCP (pneumocystis carinii pneumonia)   CMV (cytomegalovirus)   Sacral decubitus ulcer, stage II   Hyponatremia   Pneumomediastinum   Fungemia   Spontaneous pneumothorax   . acetaZOLAMIDE  250 mg Intravenous Q6H  . antiseptic oral rinse  15 mL Mouth Rinse QID  . azithromycin  1,200 mg Oral Weekly  . ceFEPime (MAXIPIME) IV  1 g Intravenous 3 times per day  . chlorhexidine  15 mL Mouth Rinse BID  . collagenase   Topical Daily  . docusate  100 mg Oral BID  . dolutegravir  50 mg Oral Daily  . emtricitabine-tenofovir  1 tablet Oral Daily  . enoxaparin (LOVENOX) injection  40 mg Subcutaneous Daily  . etomidate  40 mg Intravenous Once  . feeding supplement (PRO-STAT SUGAR FREE 64)  30 mL Per Tube BID  . free water  200 mL Per Tube 3 times per day  . furosemide  40 mg Intravenous Q6H  . ganciclovir (CYTOVENE) IV  5 mg/kg Intravenous Q12H  . insulin aspart  0-15 Units Subcutaneous 6 times per day  . methylPREDNISolone (SOLU-MEDROL) injection  125 mg Intravenous Q6H  . micafungin (MYCAMINE) IV  100 mg Intravenous QHS  . multivitamin  5 mL Per Tube Daily  . pantoprazole sodium  40 mg Per Tube Daily  . potassium chloride  40 mEq Per Tube TID PC & HS  . propofol  5-70 mcg/kg/min Intravenous Once  . QUEtiapine  50 mg Per Tube BID  . sodium chloride  10-40 mL Intracatheter Q12H  . sulfamethoxazole-trimethoprim  1 tablet Per Tube Daily  . [START ON 10/22/2013] vancomycin  750 mg Intravenous Q12H    Objective: Temp:  [98.4 F (36.9 C)-102.2 F (39 C)] 99.2 F (37.3 C) (04/24 1200) Pulse Rate:  [108-132] 114 (04/24 1500) Resp:  [17-25]  18 (04/24 1500) BP: (115-168)/(69-103) 124/76 mmHg (04/24 1500) SpO2:  [91 %-100 %] 94 % (04/24 1500) FiO2 (%):  [40 %-50 %] 40 % (04/24 1200) Weight:  [152 lb 12.5 oz (69.3 kg)] 152 lb 12.5 oz (69.3 kg) (04/24 0500)   General: Sedated on the ventilator. He underwent tracheostomy this morning and had a right chest tube placed for a new right pneumothorax. Lungs: Clear anteriorly Cor: Regular S1-S2 no murmurs Abdomen: Soft.   Lab Results Lab Results  Component Value Date   WBC 11.5* 10/21/2013   HGB 9.5* 10/21/2013   HCT 30.2* 10/21/2013   MCV 94.1 10/21/2013   PLT 290 10/21/2013    Lab Results  Component Value Date   CREATININE 1.01 10/21/2013   BUN 64* 10/21/2013   NA 142 10/21/2013   K 3.4* 10/21/2013   CL 93* 10/21/2013   CO2 37* 10/21/2013    Lab Results  Component Value Date   ALT 34 10/18/2013   AST 35 10/18/2013   ALKPHOS 101 10/18/2013   BILITOT <0.2* 10/18/2013  HIV 1 RNA Quant (copies/mL)  Date Value  10/08/2013 756*  09/27/2013 13616*  09/13/2013 161096*     CD4 T Cell Abs (/uL)  Date Value  10/08/2013 30*  09/27/2013 30*  09/13/2013 60*    Microbiology: Recent Results (from the past 240 hour(s))  CULTURE, BLOOD (ROUTINE X 2)     Status: None   Collection Time    10/13/13  9:00 AM      Result Value Ref Range Status   Specimen Description BLOOD RIGHT ARM   Final   Special Requests BOTTLES DRAWN AEROBIC AND ANAEROBIC 8CC   Final   Culture  Setup Time     Final   Value: 10/13/2013 10:06     Performed at [facility]   Culture     Final   Value: NO GROWTH 5 DAYS     Performed at [facility]   Report Status 10/19/2013 FINAL   Final  CULTURE, BLOOD (ROUTINE X 2)     Status: None   Collection Time    10/13/13  9:10 AM      Result Value Ref Range Status   Specimen Description BLOOD LEFT ARM   Final   Special Requests BOTTLES DRAWN AEROBIC AND ANAEROBIC [facility]   Final   Culture  Setup Time     Final   Value: 10/13/2013 10:06     Performed at  [facility]   Culture     Final   Value: CANDIDA ALBICANS     Note: Gram Stain Report Called to,Read Back By and Verified With: TZEZT WOODS 10/15/13 1232A FULKC     Performed at [facility]   Report Status 10/17/2013 FINAL   Final  URINE CULTURE     Status: None   Collection Time    10/13/13 12:27 PM      Result Value Ref Range Status   Specimen Description URINE, CATHETERIZED   Final   Special Requests NONE   Final   Culture  Setup Time     Final   Value: 10/13/2013 14:45     Performed at [facility] Count     Final   Value: 5,000 COLONIES/ML     Performed at [facility]   Culture     Final   Value: INSIGNIFICANT GROWTH     Performed at [facility]   Report Status 10/14/2013 FINAL   Final  CULTURE, RESPIRATORY (NON-EXPECTORATED)     Status: None   Collection Time    10/13/13  4:04 PM      Result Value Ref Range Status   Specimen Description ENDOTRACHEAL   Final   Special Requests NONE   Final   Gram Stain     Final   Value: ABUNDANT WBC PRESENT,BOTH PMN AND MONONUCLEAR     NO SQUAMOUS EPITHELIAL CELLS SEEN     NO ORGANISMS SEEN     Performed at [facility]   Culture     Final   Value: Non-Pathogenic Oropharyngeal-type Flora Isolated.     Performed at [facility]   Report Status 10/16/2013 FINAL   Final  URINE CULTURE     Status: None   Collection Time    10/17/13  4:59 PM      Result Value Ref Range Status   Specimen Description URINE, CATHETERIZED   Final   Special Requests Normal   Final   Culture  Setup Time  Final   Value: 10/18/2013 02:13     [facility]   [place] Count     Final   Value: NO GROWTH     [facility]   Culture     Final   Value: NO GROWTH     [facility]   Report Status 10/18/2013 FINAL   Final  CULTURE, BLOOD (ROUTINE X 2)     Status: None   Collection Time    10/17/13  5:00 PM      Result Value Ref  Range Status   Specimen Description BLOOD RIGHT RIGHT ANTECUBITAL   Final   Special Requests BOTTLES DRAWN AEROBIC AND ANAEROBIC 10CC   Final   Culture  Setup Time     Final   Value: 10/17/2013 18:56     [facility]   Culture     Final   Value:        BLOOD CULTURE RECEIVED NO GROWTH TO DATE CULTURE WILL BE HELD FOR 5 DAYS BEFORE ISSUING A FINAL NEGATIVE REPORT     [facility]   Report Status PENDING   Incomplete  CULTURE, RESPIRATORY (NON-EXPECTORATED)     Status: None   Collection Time    10/17/13  5:00 PM      Result Value Ref Range Status   Specimen Description TRACHEAL ASPIRATE   Final   Special Requests NONE   Final   Gram Stain     Final   Value: MODERATE WBC PRESENT, PREDOMINANTLY PMN     NO SQUAMOUS EPITHELIAL CELLS SEEN     NO ORGANISMS SEEN     [facility]   Culture     Final   Value: Non-Pathogenic Oropharyngeal-type Flora Isolated.     [facility]   Report Status 10/19/2013 FINAL   Final  CULTURE, BLOOD (ROUTINE X 2)     Status: None   Collection Time    10/17/13  5:05 PM      Result Value Ref Range Status   Specimen Description BLOOD RIGHT ARM   Final   Special Requests BOTTLES DRAWN AEROBIC AND ANAEROBIC 10CC   Final   Culture  Setup Time     Final   Value: 10/17/2013 18:56     [facility]   Culture     Final   Value:        BLOOD CULTURE RECEIVED NO GROWTH TO DATE CULTURE WILL BE HELD FOR 5 DAYS BEFORE ISSUING A FINAL NEGATIVE REPORT     [facility]   Report Status PENDING   Incomplete  CULTURE, RESPIRATORY (NON-EXPECTORATED)     Status: None   Collection Time    10/19/13  5:53 PM      Result Value Ref Range Status   Specimen Description TRACHEAL ASPIRATE   Final   Special Requests NONE   Final   Gram Stain     Final   Value: ABUNDANT WBC PRESENT,BOTH PMN AND MONONUCLEAR     NO SQUAMOUS EPITHELIAL CELLS SEEN     NO ORGANISMS  SEEN     [facility]   Culture     Final   Value: Non-Pathogenic Oropharyngeal-type Flora Isolated.     [facility] at [facility]   Report Status PENDING   Incomplete  CULTURE, BLOOD (ROUTINE X 2)  Status: None   Collection Time    10/19/13  5:55 PM      Result Value Ref Range Status   Specimen Description BLOOD RIGHT ARM   Final   Special Requests BOTTLES DRAWN AEROBIC AND ANAEROBIC 8CC   Final   Culture  Setup Time     Final   Value: 10/19/2013 21:55     Performed at [facility]   Culture     Final   Value:        BLOOD CULTURE RECEIVED NO GROWTH TO DATE CULTURE WILL BE HELD FOR 5 DAYS BEFORE ISSUING A FINAL NEGATIVE REPORT     Performed at [facility]   Report Status PENDING   Incomplete  CULTURE, BLOOD (ROUTINE X 2)     Status: None   Collection Time    10/19/13  6:00 PM      Result Value Ref Range Status   Specimen Description BLOOD RIGHT ARM   Final   Special Requests BOTTLES DRAWN AEROBIC ONLY 2CC   Final   Culture  Setup Time     Final   Value: 10/19/2013 21:55     Performed at [facility]   Culture     Final   Value:        BLOOD CULTURE RECEIVED NO GROWTH TO DATE CULTURE WILL BE HELD FOR 5 DAYS BEFORE ISSUING A FINAL NEGATIVE REPORT     Performed at [facility]   Report Status PENDING   Incomplete  CLOSTRIDIUM DIFFICILE BY PCR     Status: None   Collection Time    10/20/13  6:10 AM      Result Value Ref Range Status   C difficile by pcr NEGATIVE  NEGATIVE Final   Comment: Performed at [facility]    Studies/Results: Dg Abd 1 View  10/21/2013   CLINICAL DATA:  48 year old male status post NG tube advancement. Initial encounter.  EXAM: ABDOMEN - 1 VIEW  COMPARISON:  1146 hr chest radiograph from the same day and earlier.  FINDINGS: Portable AP supine view at 1250 hrs. The enteric tube is been advanced, the tip now projects in the gastric cardia. The side hole probably is still proximal to  the GE junction. Recommend advancing 5 more cm to place the side hole definitely within the stomach.  Non obstructed bowel gas pattern.  IMPRESSION: NG tube tip just inside the stomach. Advance 5 additional cm for more optimal placement.   Electronically Signed   By: Augusto Gamble M.D.   On: 10/21/2013 13:00   Dg Abd 1 View  10/21/2013   CLINICAL DATA:  48 year old male nasogastric tube placement. Initial encounter.  EXAM: ABDOMEN - 1 VIEW  COMPARISON:  Chest radiographs 1020 hr the same day. Abdomen film 10/05/2013.  FINDINGS: Portable AP supine view at 1108 hrs. Mild motion artifact. No enteric tube identified in the abdomen or visible lower chest. Some EKG leads and wires are present. No enteric tube identified on the recent chest radiograph. Non obstructed bowel gas pattern.  IMPRESSION: No enteric tube identified on this view of the abdomen.  Study discussed by telephone with the patient's RN at the bedside on 10/21/2013 at 11:18.  She reports that clinically it seems the tube is appropriately placed.  I advised a followup portable chest view to further evaluate, and that is being arranged at the time of this report.   Electronically Signed   By: Si Gaul.D.  On: 10/21/2013 11:21   Chest Portable 1 View To Assess Tube Placement And Rule-out Pneumothorax  10/21/2013   CLINICAL DATA:  NG tube replacement.  EXAM: PORTABLE CHEST - 1 VIEW  COMPARISON:  One-view chest 10/21/2013  FINDINGS: Heart size is normal. Diffuse interstitial and airspace disease is similar to the prior exam. A left-sided PICC line is in place. The tracheostomy tube is stable. A right-sided chest tube is unchanged. A small right apical pneumothorax is stable. An NG tube is in place. The tip is at the level heart, beyond the carina, but well above the GE junction. The tube will need to be advanced nearly 20 cm for the side port to be be on the GE junction.  IMPRESSION: Add  1. The NG tube terminates in the distal esophagus and will need to  be advanced significantly for the side port to be beyond the GE junction. 2. Stable right apical pneumothorax. 3. Stable diffuse bilateral interstitial and airspace disease.   Electronically Signed   By: Gennette Pachris  Mattern M.D.   On: 10/21/2013 12:08   Dg Chest Port 1 View  10/21/2013   CLINICAL DATA:  Status post chest tube placement  EXAM: PORTABLE CHEST - 1 VIEW  COMPARISON:  Film from earlier in the same day  FINDINGS: A tracheostomy tube is now identified in satisfactory position 5.8 cm above the carina. A left-sided PICC line is again identified and stable. A new right-sided chest tube is been placed with near complete resolution of the previously seen pneumothorax. Only a small apical component remains. Diffuse bilateral infiltrates are again identified. No new bony abnormality is seen.  IMPRESSION: Near-complete resolution of right-sided pneumothorax following chest tube placement.  New tracheostomy catheter in satisfactory position.  The remainder of the exam is stable.   Electronically Signed   By: Alcide CleverMark  Lukens M.D.   On: 10/21/2013 10:33   Dg Chest Port 1 View  10/21/2013   CLINICAL DATA:  Right pneumothorax  EXAM: PORTABLE CHEST - 1 VIEW  COMPARISON:  Film from earlier in the same day  FINDINGS: Cardiac shadow is stable. The endotracheal tube, PICC line and nasogastric catheter are all stable in appearance. Diffuse bilateral infiltrates are again identified. The previously described right-sided pneumothorax has increased when compared with the prior exam. There is now approximately 2 cm of excursion in the base laterally and a and apical component is now visualized. No acute bony abnormality is noted.  IMPRESSION: Increasing right pneumothorax.  Critical Value/emergent results were called by telephone at the time of interpretation on 10/21/2013 at 8:36 AM to [facility] the patient's nurse, who verbally acknowledged these results.   Electronically Signed   By: Alcide CleverMark  Lukens M.D.   On: 10/21/2013 08:37   Dg  Chest Port 1 View  10/21/2013   CLINICAL DATA:  Check endotracheal tube position  EXAM: PORTABLE CHEST - 1 VIEW  COMPARISON:  10/20/2013  FINDINGS: Endotracheal tube is noted 4.3 cm above the carina. A left-sided PICC line is seen in the distal superior vena cava. A nasogastric catheter is noted within stomach. Diffuse infiltrative changes are again identified bilaterally. Is small pneumothorax is noted in the right lung base laterally. No other pneumothorax is seen. No acute bony abnormality is noted.  IMPRESSION: New right basilar pneumothorax.  The remainder of the exam is stable from the prior study.  Critical Value/emergent results were called by telephone at the time of interpretation on 10/21/2013 at 7:00 AM to Tiffany the patient's nurse, who verbally  acknowledged these results.   Electronically Signed   By: Alcide Clever M.D.   On: 10/21/2013 07:04   Dg Chest Port 1 View  10/20/2013   CLINICAL DATA:  Evaluate endotracheal tube positioning  EXAM: PORTABLE CHEST - 1 VIEW  COMPARISON:  DG CHEST 1V PORT dated 10/19/2013; DG CHEST 1V PORT dated 10/17/2013; DG CHEST 1V PORT dated 10/13/2013; DG CHEST 1V PORT dated 10/10/2013; DG CHEST 1V PORT dated 10/06/2013; CT CHEST W/CM dated 10/06/2013  FINDINGS: Grossly unchanged cardiac silhouette and mediastinal contours. Stable position of support apparatus. Minimally improved aeration of the lungs with persistent extensive bilateral granular interstitial opacities with relative areas of consolidation within the left upper and right mid lungs. No new focal airspace opacities. Trace right-sided effusion is not excluded. A pneumothorax. Grossly unchanged bones including advanced degenerative change of the right glenohumeral joint.  IMPRESSION: 1.  Stable positioning of support apparatus.  No pneumothorax. 2. Minimally improved aeration of the lungs suggests resolving edema and/or atelectasis. 3. Persistent bilateral granular interstitial opacities with areas of consolidation  within the right mid and left lower lung with differential considerations including but not limited to residual multifocal infection/ARDS and asymmetric pulmonary edema.   Electronically Signed   By: Simonne Come M.D.   On: 10/20/2013 07:47    Assessment: He continues to have intermittent fevers but all recent cultures and C. difficile PCR have been negative for any new infection. His vancomycin trough level is elevated. I will stop vancomycin and cefepime now. I will continue ganciclovir to cover for CMV viremia and pneumonia. He grew Candida albicans and one of 2 blood cultures from April 16. Candida albicans it is usually susceptible to fluconazole. I will change micafungin to fluconazole now. I agree with continuing high-dose steroids since he has had significant improvement in his respiratory status since his steroids were increased to 48 hours ago.  Plan: 1. Continue ganciclovir and steroids 2. Change micafungin to fluconazole  3. Discontinue vancomycin and cefepime 4. Continue antiretroviral therapy and prophylaxis for pneumocystis and Mycobacterium avium  Cliffton Asters, MD [facility] [phone] pager   [phone] cell 10/21/2013, 3:43 PM

## 2013-10-21 NOTE — Progress Notes (Signed)
10/21/13 1030  Clinical Encounter Type  Visited With Family (wife Winslowaryn, parents Louanne SkyeDick and [place]herry, dtr Quincy SimmondsLindsay, bro Chris)  Visit Type Spiritual support;Social support  Referral From Nurse  Spiritual Encounters  Spiritual Needs Prayer;Emotional  Stress Factors  Family Stress Factors Loss of control;Major life changes;Financial concerns;Exhausted   Continuing to follow closely for spiritual, emotional, discernment, and self-care support.  Thank you to RN Judeth CornfieldStephanie for page today re support as family learned of pneumothorax and thus had another unexpected setback to process.  Family is overloaded emotionally, coping with many layers of fear, grief, hope, love, and other emotional complexity.  In response, family is asking many questions about prognosis, future quality of life, how best to honor hope and possibility, and how to cope in present and future.  Continuing to provide pastoral presence, empathic listening, resources for support, encouragement about self-care, questions for discernment, and other spiritual/emotional support.  Please continue to page as needs arise.  Thank you.  [address]Chaplain Kylinn Shropshire [place], [place] [phone]

## 2013-10-21 NOTE — Progress Notes (Addendum)
ANTIBIOTIC CONSULT NOTE - FOLLOW UP  Pharmacy Consult for Fluconazole Indication: Candidemia  Allergies  Allergen Reactions  . Penicillins Rash    Patient Measurements: Height: 5\' 10"  (177.8 cm) Weight: 152 lb 12.5 oz (69.3 kg) IBW/kg (Calculated) : 73 Adjusted Body Weight:   Vital Signs: Temp: 99.2 F (37.3 C) (04/24 1200) Temp src: Oral (04/24 1200) BP: 124/76 mmHg (04/24 1500) Pulse Rate: 114 (04/24 1500) Intake/Output from previous day: 04/23 0701 - 04/24 0700 In: 4196 [I.V.:1266; NG/GT:1700; IV Piggyback:1200] Out: 4125 [Urine:3825; Stool:300] Intake/Output from this shift: Total I/O In: 1039.6 [I.V.:209.6; Other:205; NG/GT:225; IV Piggyback:400] Out: 1950 [Urine:1950]  Labs:  Recent Labs  10/19/13 0530 10/20/13 0535 10/21/13 0430  WBC 12.2* 10.6* 11.5*  HGB 9.9* 9.4* 9.5*  PLT 232 247 290  CREATININE 0.53 0.59 1.01   Estimated Creatinine Clearance: 88.6 ml/min (by C-G formula based on Cr of 1.01).  Recent Labs  10/21/13 1130  VANCOTROUGH 48.4*      Assessment: 47 yoM admitted 4/4 with tachycardia and SOB, concerning for ongoing/recurrent PCP pneumonia. PMH includes recent hospitalization (3/17- 09/20/13) with newly diagnosed HIV, started on ART (Stribild), and treatment for PCP pneumonia. At discharge, he was taking Bactrim, but prematurely decreased his dosage. Pharmacy initially consulted to dose Vancomycin, Cefepime, and Bactrim. ID consult recommended repeating an entire new course for PCP PNA. 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 Since then, antiinfectives narrowed to bactrim and ganciclovir, with micafungin added for fungemia.  On 4/22 vancomycin and cefepime added as patient febrile 4/21 and continues to have diffuse BL pulmonary infiltrates and increased O2 requirements.   4/4 >> Vanc >> 4/4 4/4 >> Cefepime >> 4/14 4/4 >> Bactrim IV >> 4/21 4/6 >> ganciclovir >> 4/14 >> Qweek fluconazole 100mg  >> 4/18 PTA >> Qweek azithro  (MAC ppx) >> 4/16 >> Vanc >> 4/18, 4/22 >> 4/16 >> Cefepime >> 4/18, 4/22 >> 4/18 >> micafungin >> 4/21>> Septra PO >>  Tmax: 99.4 WBCs: 12.2 Renal: SCr 1.01 (from 0.59), CrCl = 7392ml/min (N), 7089ml/min(C-G), excellent UOP  CD4: 30 (4/11), 30 (3/31) HIV RNA quant: 756 (4/11), 13K (3/31)  4/4 blood x2: NGF 4/4 Histoplasma Ag: negative 4/5 PCP by DFA: negative (on Bactrim x2 weeks prior to collection) 4/5 AFB: ngtd, pending, in progress for 6 weeks 4/5 Resp: NGF 4/5 Fungal: ngtd, pending, in progress for 4 weeks 4/5 CMV cx: negative 4/7 fungitell (B glucan test) (-) 4/8 CMV DNA plasma: 78k copies 4/9 cryptococcal Ag (-) 4/11 blood x2: ng-final 4/11 AFB: ngtd, pending, in progress for 6 weeks 4/11 resp virus panel: +parainfluenza 2 4/11 Quantiferon TB: indeterminate 4/13: CMV DNA: > 101,000 (high) 4/13 Fungus w smear (BAL): C.Albicans 4/13 fungal Ab: all negative 4/13 BAL: non-path oro flora - final 4/13 TB PCR: not detected 4/16 Blood: 1/2 candida albicans, 1/2 NGF 4/16 Urine: insig growth 4/20 Urine: NGF 4/20 Trach asp: normal flora 4/20 Blood x2: ngtd 4/22 Tracheal aspirate: normal flora 4/23 C. Difficile: neg 4/22 blood: NGTD  Drug level / dose changes info: 4/4: Dr. Daiva EvesVan Dam dc'd Vanc and changed Cefepime from 2g q8h to 1g q8h. 4/18: VT 12.7 on 1gm q8 (drawn 6h from previous dose)-now d/c 4/24: VT 48.4 on 1250mg  q8h (prior to 6th dose, drawn 6.5h from previous dose), change to 750mg  q12h   Goal of Therapy:  Vancomycin trough level 15-20 mcg/ml  Plan:   Vancomycin trough SUPRAtherapeutic, vanco trough 4/18 was 12.7 on 1gm IV q8h so level much higher  than anticipated.  Scr is WNL but has increased from 0.59 to 1.01 this am.  UOP excellent on IV furosemide.  Change vancomycin to 750mg  IV q12h for est new Pk = 29, trough = 16.8.  (next dose due tomorrow am to allow level to return to within desired range)  Will check random level in am to ensure level trending down  as expect  Continue Cefepime 1gm IV q8h  On Micafungin 150mg  IV daily for candidemia, but on higher dose than recommended for candidemia (on esophageal candidiasis dosing). Reduce micafungin to 100mg  daily for candidemia (blood cultures have remained sterile)  Juliette Alcideustin Zeigler, PharmD, BCPS.   Pager: [phone]  10/21/2013,4:05 PM  ADDENDUM: ID has discontinued vanc/cefepime and changed micafungin to fluconzole as Candida albicans is usually susceptible to fluconazole.    Start fluconazole 400mg  IV q24h for candidemia.  F/u cultures and sensitivities.    Haynes Hoehnolleen Khylan Sawyer, PharmD, BCPS 10/21/2013, 4:11 PM  Pager: [phone]

## 2013-10-21 NOTE — Procedures (Signed)
Bedside Tracheostomy Insertion Procedure Note   Patient Details:   Name: William Bender DOB: 10/06/1965 MRN: [id number]  Procedure: Tracheostomy  Pre Procedure Assessment: ET Tube Size: 8.0 ET Tube secured at lip (cm): 26 Bite block in place: No, paralytic given Breath Sounds: Clear  Post Procedure Assessment: BP 130/91  Pulse 122  Temp(Src) 98.7 F (37.1 C) (Oral)  Resp 18  Ht 5\' 10"  (1.778 m)  Wt 152 lb 12.5 oz (69.3 kg)  BMI 21.92 kg/m2  SpO2 99% O2 sats: stable throughout Complications: No apparent complications Patient did tolerate procedure well Tracheostomy Brand:Shiley Tracheostomy Style:Cuffed Tracheostomy Size: 6 Tracheostomy Secured WUJ:[id number]:Sutures, velcro Tracheostomy Placement Confirmation:Trach cuff visualized and in place and Chest X ray ordered for placement    William FinnerJustin D Sampson Bender 10/21/2013, 10:12 AM

## 2013-10-22 ENCOUNTER — Inpatient Hospital Stay (HOSPITAL_COMMUNITY): Payer: BC Managed Care – PPO

## 2013-10-22 DIAGNOSIS — J982 Interstitial emphysema: Secondary | ICD-10-CM

## 2013-10-22 LAB — CBC
HEMATOCRIT: 28.2 % — AB (ref 39.0–52.0)
HEMOGLOBIN: 8.2 g/dL — AB (ref 13.0–17.0)
MCH: 28.2 pg (ref 26.0–34.0)
MCHC: 29.1 g/dL — ABNORMAL LOW (ref 30.0–36.0)
MCV: 96.9 fL (ref 78.0–100.0)
PLATELETS: 245 10*3/uL (ref 150–400)
RBC: 2.91 MIL/uL — ABNORMAL LOW (ref 4.22–5.81)
RDW: 16.1 % — ABNORMAL HIGH (ref 11.5–15.5)
WBC: 6.2 10*3/uL (ref 4.0–10.5)

## 2013-10-22 LAB — GLUCOSE, CAPILLARY
GLUCOSE-CAPILLARY: 213 mg/dL — AB (ref 70–99)
Glucose-Capillary: 193 mg/dL — ABNORMAL HIGH (ref 70–99)
Glucose-Capillary: 167 mg/dL — ABNORMAL HIGH (ref 70–99)
Glucose-Capillary: 214 mg/dL — ABNORMAL HIGH (ref 70–99)
Glucose-Capillary: 191 mg/dL — ABNORMAL HIGH (ref 70–99)
Glucose-Capillary: 185 mg/dL — ABNORMAL HIGH (ref 70–99)

## 2013-10-22 LAB — CULTURE, RESPIRATORY W GRAM STAIN

## 2013-10-22 LAB — BASIC METABOLIC PANEL
BUN: 69 mg/dL — ABNORMAL HIGH (ref 6–23)
CHLORIDE: 104 meq/L (ref 96–112)
CO2: 37 meq/L — AB (ref 19–32)
Calcium: 8.5 mg/dL (ref 8.4–10.5)
Creatinine, Ser: 1.11 mg/dL (ref 0.50–1.35)
GFR calc Af Amer: 90 mL/min — ABNORMAL LOW (ref >90)
GFR calc non Af Amer: 77 mL/min — ABNORMAL LOW (ref >90)
Glucose, Bld: 228 mg/dL — ABNORMAL HIGH (ref 70–99)
POTASSIUM: 3.8 meq/L (ref 3.7–5.3)
SODIUM: 149 meq/L — AB (ref 137–147)

## 2013-10-22 LAB — VANCOMYCIN, RANDOM: Vancomycin Rm: 22.2 ug/mL

## 2013-10-22 LAB — PHOSPHORUS: Phosphorus: 5.2 mg/dL — ABNORMAL HIGH (ref 2.3–4.6)

## 2013-10-22 LAB — MAGNESIUM: Magnesium: 2.9 mg/dL — ABNORMAL HIGH (ref 1.5–2.5)

## 2013-10-22 LAB — CULTURE, RESPIRATORY

## 2013-10-22 LAB — TROPONIN I
Troponin I: <0.3 ng/mL (ref <0.30)
Troponin I: <0.3 ng/mL (ref <0.30)

## 2013-10-22 MED ORDER — METOPROLOL TARTRATE 1 MG/ML IV SOLN
INTRAVENOUS | Status: AC
  Administered 2013-10-22: 5 mg
  Filled 2013-10-22: qty 5

## 2013-10-22 MED ORDER — INSULIN GLARGINE 100 UNIT/ML ~~LOC~~ SOLN
10.0000 [IU] | Freq: Every day | SUBCUTANEOUS | Status: DC
  Administered 2013-10-22 – 2013-10-25 (×4): 10 [IU] via SUBCUTANEOUS
  Filled 2013-10-22 (×5): qty 0.1

## 2013-10-22 MED ORDER — METHYLPREDNISOLONE SODIUM SUCC 40 MG IJ SOLR
40.0000 mg | Freq: Two times a day (BID) | INTRAMUSCULAR | Status: DC
  Administered 2013-10-22 – 2013-10-24 (×4): 40 mg via INTRAVENOUS
  Filled 2013-10-22 (×6): qty 1

## 2013-10-22 MED ORDER — METOPROLOL TARTRATE 1 MG/ML IV SOLN
5.0000 mg | Freq: Once | INTRAVENOUS | Status: AC
  Administered 2013-10-22: 5 mg via INTRAVENOUS

## 2013-10-22 NOTE — Progress Notes (Signed)
INFECTIOUS DISEASE PROGRESS NOTE  ID: William Bender is a 48 y.o. male with  Principal Problem:   Pneumonia Active Problems:   Acute respiratory failure   AIDS   PCP (pneumocystis carinii pneumonia)   CMV (cytomegalovirus)   Sacral decubitus ulcer, stage II   Hyponatremia   Pneumomediastinum   Fungemia   Spontaneous pneumothorax  Subjective: On vent  Abtx:  Anti-infectives   Start     Dose/Rate Route Frequency Ordered Stop   10/22/13 1000  vancomycin (VANCOCIN) IVPB 750 mg/150 ml premix  Status:  Discontinued     750 mg 150 mL/hr over 60 Minutes Intravenous Every 12 hours 10/21/13 1318 10/21/13 1550   10/21/13 2200  micafungin (MYCAMINE) 100 mg in sodium chloride 0.9 % 100 mL IVPB  Status:  Discontinued     100 mg 100 mL/hr over 1 Hours Intravenous Daily at bedtime 10/21/13 1342 10/21/13 1550   10/21/13 2200  fluconazole (DIFLUCAN) IVPB 400 mg     400 mg 100 mL/hr over 120 Minutes Intravenous Every 24 hours 10/21/13 1615     10/19/13 2000  vancomycin (VANCOCIN) 1,250 mg in sodium chloride 0.9 % 250 mL IVPB  Status:  Discontinued     1,250 mg 166.7 mL/hr over 90 Minutes Intravenous Every 8 hours 10/19/13 1736 10/21/13 1318   10/19/13 1830  ceFEPIme (MAXIPIME) 1 g in dextrose 5 % 50 mL IVPB  Status:  Discontinued     1 g 100 mL/hr over 30 Minutes Intravenous 3 times per day 10/19/13 1736 10/21/13 1550   10/18/13 1600  sulfamethoxazole-trimethoprim (BACTRIM DS) 800-160 MG per tablet 1 tablet     1 tablet Per Tube Daily 10/18/13 1550     10/15/13 0100  micafungin (MYCAMINE) 150 mg in sodium chloride 0.9 % 100 mL IVPB  Status:  Discontinued     150 mg 100 mL/hr over 1 Hours Intravenous Daily at bedtime 10/15/13 0056 10/21/13 1342   10/13/13 1800  vancomycin (VANCOCIN) IVPB 1000 mg/200 mL premix  Status:  Discontinued     1,000 mg 200 mL/hr over 60 Minutes Intravenous Every 8 hours 10/13/13 0836 10/15/13 1033   10/13/13 1800  ceFEPIme (MAXIPIME) 2 g in dextrose 5 % 50 mL  IVPB  Status:  Discontinued     2 g 100 mL/hr over 30 Minutes Intravenous Every 8 hours 10/13/13 0836 10/15/13 1033   10/13/13 0900  vancomycin (VANCOCIN) 1,750 mg in sodium chloride 0.9 % 500 mL IVPB     1,750 mg 250 mL/hr over 120 Minutes Intravenous  Once 10/13/13 0836 10/13/13 1200   10/13/13 0900  ceFEPIme (MAXIPIME) 2 g in dextrose 5 % 50 mL IVPB     2 g 100 mL/hr over 30 Minutes Intravenous  Once 10/13/13 0836 10/13/13 1000   10/11/13 1600  fluconazole (DIFLUCAN) tablet 100 mg  Status:  Discontinued     100 mg Oral Weekly 10/11/13 1553 10/15/13 1033   10/08/13 1400  ceFEPIme (MAXIPIME) 1 g in dextrose 5 % 50 mL IVPB  Status:  Discontinued     1 g 100 mL/hr over 30 Minutes Intravenous 3 times per day 10/08/13 1119 10/11/13 1553   10/06/13 1000  azithromycin (ZITHROMAX) tablet 1,200 mg     1,200 mg Oral Weekly 10/12/2013 1355     10/03/13 1800  ganciclovir (CYTOVENE) 385 mg in sodium chloride 0.9 % 100 mL IVPB     5 mg/kg  76.5 kg 100 mL/hr over 60 Minutes Intravenous Every 12 hours 10/03/13  1723     10/03/13 1000  dolutegravir (TIVICAY) tablet 50 mg    Comments:  GIVE DOWN THE NG TUBE   50 mg Oral Daily 10/02/13 1554     10/03/13 1000  emtricitabine-tenofovir (TRUVADA) 200-300 MG per tablet 1 tablet     1 tablet Oral Daily 10/02/13 1554     10/02/13 0800  elvitegravir-cobicistat-emtricitabine-tenofovir (STRIBILD) 150-150-200-300 MG tablet 1 tablet  Status:  Discontinued     1 tablet Oral Daily with breakfast 10/23/2013 1355 10/02/13 1554   09/28/2013 2200  ceFEPIme (MAXIPIME) 1 g in dextrose 5 % 50 mL IVPB  Status:  Discontinued     1 g 100 mL/hr over 30 Minutes Intravenous 3 times per day 10/04/2013 1141 10/08/13 0934   09/30/2013 2000  vancomycin (VANCOCIN) IVPB 750 mg/150 ml premix  Status:  Discontinued     750 mg 150 mL/hr over 60 Minutes Intravenous Every 8 hours 09/28/2013 1141 10/13/2013 1444   10/08/2013 2000  sulfamethoxazole-trimethoprim (BACTRIM) 450 mg in dextrose 5 % 500 mL IVPB   Status:  Discontinued     450 mg 352.1 mL/hr over 90 Minutes Intravenous Every 8 hours 10/27/2013 1141 10/18/13 1550   10/06/2013 1115  ceFEPIme (MAXIPIME) 2 g in dextrose 5 % 50 mL IVPB  Status:  Discontinued     2 g 100 mL/hr over 30 Minutes Intravenous Every 12 hours 10/21/2013 1104 10/24/2013 1141   10/13/2013 1100  sulfamethoxazole-trimethoprim (BACTRIM) 450 mg in dextrose 5 % 500 mL IVPB     450 mg 352.1 mL/hr over 90 Minutes Intravenous  Once 10/26/2013 1054 10/04/13 1643   10/12/2013 1045  vancomycin (VANCOCIN) 1,500 mg in sodium chloride 0.9 % 500 mL IVPB     1,500 mg 250 mL/hr over 120 Minutes Intravenous  Once 10/26/2013 1038 10/25/2013 1300   10/21/2013 1030  vancomycin (VANCOCIN) IVPB 1000 mg/200 mL premix  Status:  Discontinued     1,000 mg 200 mL/hr over 60 Minutes Intravenous  Once 10/24/2013 1024 10/16/2013 1035      Medications:  Scheduled: . antiseptic oral rinse  15 mL Mouth Rinse QID  . azithromycin  1,200 mg Oral Weekly  . chlorhexidine  15 mL Mouth Rinse BID  . collagenase   Topical Daily  . docusate  100 mg Oral BID  . dolutegravir  50 mg Oral Daily  . emtricitabine-tenofovir  1 tablet Oral Daily  . enoxaparin (LOVENOX) injection  40 mg Subcutaneous Daily  . etomidate  40 mg Intravenous Once  . feeding supplement (PRO-STAT SUGAR FREE 64)  30 mL Per Tube BID  . fluconazole (DIFLUCAN) IV  400 mg Intravenous Q24H  . free water  200 mL Per Tube 3 times per day  . ganciclovir (CYTOVENE) IV  5 mg/kg Intravenous Q12H  . insulin aspart  0-15 Units Subcutaneous 6 times per day  . insulin glargine  10 Units Subcutaneous QHS  . methylPREDNISolone (SOLU-MEDROL) injection  40 mg Intravenous Q12H  . multivitamin  5 mL Per Tube Daily  . pantoprazole sodium  40 mg Per Tube Daily  . QUEtiapine  50 mg Per Tube BID  . sodium chloride  10-40 mL Intracatheter Q12H  . sulfamethoxazole-trimethoprim  1 tablet Per Tube Daily    Objective: Vital signs in last 24 hours: Temp:  [97.7 F (36.5  C)-103.1 F (39.5 C)] 97.7 F (36.5 C) (04/25 1200) Pulse Rate:  [80-120] 80 (04/25 0800) Resp:  [16-21] 18 (04/25 0800) BP: (103-135)/(61-87) 119/79 mmHg (04/25 0800) SpO2:  [  93 %-97 %] 97 % (04/25 0800) FiO2 (%):  [35 %-40 %] 35 % (04/25 1217) Weight:  [64.9 kg (143 lb 1.3 oz)] 64.9 kg (143 lb 1.3 oz) (04/25 0500)   General appearance: no distress Resp: rhonchi R > L Cardio: regular rate and rhythm GI: normal findings: bowel sounds normal and soft, non-tender Extremities: edema : none  Lab Results  Recent Labs  10/21/13 0430 10/22/13 0130  WBC 11.5* 6.2  HGB 9.5* 8.2*  HCT 30.2* 28.2*  NA 142 149*  K 3.4* 3.8  CL 93* 104  CO2 37* 37*  BUN 64* 69*  CREATININE 1.01 1.11   Liver Panel No results found for this basename: PROT, ALBUMIN, AST, ALT, ALKPHOS, BILITOT, BILIDIR, IBILI,  in the last 72 hours Sedimentation Rate No results found for this basename: ESRSEDRATE,  in the last 72 hours C-Reactive Protein No results found for this basename: CRP,  in the last 72 hours  Microbiology: Recent Results (from the past 240 hour(s))  CULTURE, BLOOD (ROUTINE X 2)     Status: None   Collection Time    10/13/13  9:00 AM      Result Value Ref Range Status   Specimen Description BLOOD RIGHT ARM   Final   Special Requests BOTTLES DRAWN AEROBIC AND ANAEROBIC 8CC   Final   Culture  Setup Time     Final   Value: 10/13/2013 10:06     Performed at [facility]   Culture     Final   Value: NO GROWTH 5 DAYS     Performed at [facility]   Report Status 10/19/2013 FINAL   Final  CULTURE, BLOOD (ROUTINE X 2)     Status: None   Collection Time    10/13/13  9:10 AM      Result Value Ref Range Status   Specimen Description BLOOD LEFT ARM   Final   Special Requests BOTTLES DRAWN AEROBIC AND ANAEROBIC [facility]   Final   Culture  Setup Time     Final   Value: 10/13/2013 10:06     Performed at [facility]   Culture     Final   Value: CANDIDA ALBICANS     Note:  Gram Stain Report Called to,Read Back By and Verified With: TZEZT WOODS 10/15/13 1232A FULKC     Performed at [facility]   Report Status 10/17/2013 FINAL   Final  URINE CULTURE     Status: None   Collection Time    10/13/13 12:27 PM      Result Value Ref Range Status   Specimen Description URINE, CATHETERIZED   Final   Special Requests NONE   Final   Culture  Setup Time     Final   Value: 10/13/2013 14:45     Performed at [facility] Count     Final   Value: 5,000 COLONIES/ML     Performed at [facility]   Culture     Final   Value: INSIGNIFICANT GROWTH     Performed at [facility]   Report Status 10/14/2013 FINAL   Final  CULTURE, RESPIRATORY (NON-EXPECTORATED)     Status: None   Collection Time    10/13/13  4:04 PM      Result Value Ref Range Status   Specimen Description ENDOTRACHEAL   Final   Special Requests NONE   Final   Gram Stain     Final  Value: ABUNDANT WBC PRESENT,BOTH PMN AND MONONUCLEAR     NO SQUAMOUS EPITHELIAL CELLS SEEN     NO ORGANISMS SEEN     Performed at [facility]   Culture     Final   Value: Non-Pathogenic Oropharyngeal-type Flora Isolated.     Performed at [facility]   Report Status 10/16/2013 FINAL   Final  URINE CULTURE     Status: None   Collection Time    10/17/13  4:59 PM      Result Value Ref Range Status   Specimen Description URINE, CATHETERIZED   Final   Special Requests Normal   Final   Culture  Setup Time     Final   Value: 10/18/2013 02:13     Performed at [facility] Count     Final   Value: NO GROWTH     Performed at [facility]   Culture     Final   Value: NO GROWTH     Performed at [facility]   Report Status 10/18/2013 FINAL   Final  CULTURE, BLOOD (ROUTINE X 2)     Status: None   Collection Time    10/17/13  5:00 PM      Result Value Ref Range Status   Specimen Description BLOOD RIGHT RIGHT ANTECUBITAL   Final    Special Requests BOTTLES DRAWN AEROBIC AND ANAEROBIC 10CC   Final   Culture  Setup Time     Final   Value: 10/17/2013 18:56     Performed at [facility]   Culture     Final   Value:        BLOOD CULTURE RECEIVED NO GROWTH TO DATE CULTURE WILL BE HELD FOR 5 DAYS BEFORE ISSUING A FINAL NEGATIVE REPORT     Performed at [facility]   Report Status PENDING   Incomplete  CULTURE, RESPIRATORY (NON-EXPECTORATED)     Status: None   Collection Time    10/17/13  5:00 PM      Result Value Ref Range Status   Specimen Description TRACHEAL ASPIRATE   Final   Special Requests NONE   Final   Gram Stain     Final   Value: MODERATE WBC PRESENT, PREDOMINANTLY PMN     NO SQUAMOUS EPITHELIAL CELLS SEEN     NO ORGANISMS SEEN     Performed at [facility]   Culture     Final   Value: Non-Pathogenic Oropharyngeal-type Flora Isolated.     Performed at [facility]   Report Status 10/19/2013 FINAL   Final  CULTURE, BLOOD (ROUTINE X 2)     Status: None   Collection Time    10/17/13  5:05 PM      Result Value Ref Range Status   Specimen Description BLOOD RIGHT ARM   Final   Special Requests BOTTLES DRAWN AEROBIC AND ANAEROBIC 10CC   Final   Culture  Setup Time     Final   Value: 10/17/2013 18:56     Performed at [facility]   Culture     Final   Value:        BLOOD CULTURE RECEIVED NO GROWTH TO DATE CULTURE WILL BE HELD FOR 5 DAYS BEFORE ISSUING A FINAL NEGATIVE REPORT     Performed at [facility]   Report Status PENDING   Incomplete  CULTURE, RESPIRATORY (NON-EXPECTORATED)     Status: None  Collection Time    10/19/13  5:53 PM      Result Value Ref Range Status   Specimen Description TRACHEAL ASPIRATE   Final   Special Requests NONE   Final   Gram Stain     Final   Value: ABUNDANT WBC PRESENT,BOTH PMN AND MONONUCLEAR     NO SQUAMOUS EPITHELIAL CELLS SEEN     NO ORGANISMS SEEN     Performed at [facility]   Culture     Final   Value:  Non-Pathogenic Oropharyngeal-type Flora Isolated.     Performed at [facility]   Report Status 10/22/2013 FINAL   Final  CULTURE, BLOOD (ROUTINE X 2)     Status: None   Collection Time    10/19/13  5:55 PM      Result Value Ref Range Status   Specimen Description BLOOD RIGHT ARM   Final   Special Requests BOTTLES DRAWN AEROBIC AND ANAEROBIC 8CC   Final   Culture  Setup Time     Final   Value: 10/19/2013 21:55     Performed at [facility]   Culture     Final   Value:        BLOOD CULTURE RECEIVED NO GROWTH TO DATE CULTURE WILL BE HELD FOR 5 DAYS BEFORE ISSUING A FINAL NEGATIVE REPORT     Performed at [facility]   Report Status PENDING   Incomplete  CULTURE, BLOOD (ROUTINE X 2)     Status: None   Collection Time    10/19/13  6:00 PM      Result Value Ref Range Status   Specimen Description BLOOD RIGHT ARM   Final   Special Requests BOTTLES DRAWN AEROBIC ONLY 2CC   Final   Culture  Setup Time     Final   Value: 10/19/2013 21:55     Performed at [facility]   Culture     Final   Value:        BLOOD CULTURE RECEIVED NO GROWTH TO DATE CULTURE WILL BE HELD FOR 5 DAYS BEFORE ISSUING A FINAL NEGATIVE REPORT     Performed at [facility]   Report Status PENDING   Incomplete  CLOSTRIDIUM DIFFICILE BY PCR     Status: None   Collection Time    10/20/13  6:10 AM      Result Value Ref Range Status   C difficile by pcr NEGATIVE  NEGATIVE Final   Comment: Performed at [facility]   HIV 1 RNA Quant (copies/mL)  Date Value  10/08/2013 756*  09/27/2013 13616*  09/13/2013 409811*     CD4 T Cell Abs (/uL)  Date Value  10/08/2013 30*  09/27/2013 30*  09/13/2013 60*     Studies/Results: Dg Abd 1 View  10/22/2013   CLINICAL DATA:  Feeding tube placement.  EXAM: ABDOMEN - 1 VIEW  COMPARISON:  10/21/2013  FINDINGS: Small caliber enteric tube has been removed and a weighted feeding tube has been placed. The tip of the tube projects just beneath  the left hemidiaphragm in the expected region of the gastric fundus. No dilated loops of bowel are seen. There is elevation of the right hemidiaphragm with right greater than left basilar lung opacities, more fully evaluated on chest radiograph earlier today.  IMPRESSION: Feeding tube placement with tip overlying the gastric fundus.   Electronically Signed   By: Sebastian Ache   On: 10/22/2013 13:31   Dg Abd  1 View  10/21/2013   CLINICAL DATA:  48 year old male status post NG tube advancement. Initial encounter.  EXAM: ABDOMEN - 1 VIEW  COMPARISON:  1146 hr chest radiograph from the same day and earlier.  FINDINGS: Portable AP supine view at 1250 hrs. The enteric tube is been advanced, the tip now projects in the gastric cardia. The side hole probably is still proximal to the GE junction. Recommend advancing 5 more cm to place the side hole definitely within the stomach.  Non obstructed bowel gas pattern.  IMPRESSION: NG tube tip just inside the stomach. Advance 5 additional cm for more optimal placement.   Electronically Signed   By: Augusto Gamble M.D.   On: 10/21/2013 13:00   Dg Abd 1 View  10/21/2013   CLINICAL DATA:  48 year old male nasogastric tube placement. Initial encounter.  EXAM: ABDOMEN - 1 VIEW  COMPARISON:  Chest radiographs 1020 hr the same day. Abdomen film 10/05/2013.  FINDINGS: Portable AP supine view at 1108 hrs. Mild motion artifact. No enteric tube identified in the abdomen or visible lower chest. Some EKG leads and wires are present. No enteric tube identified on the recent chest radiograph. Non obstructed bowel gas pattern.  IMPRESSION: No enteric tube identified on this view of the abdomen.  Study discussed by telephone with the patient's RN at the bedside on 10/21/2013 at 11:18.  She reports that clinically it seems the tube is appropriately placed.  I advised a followup portable chest view to further evaluate, and that is being arranged at the time of this report.   Electronically Signed    By: Augusto Gamble M.D.   On: 10/21/2013 11:21   Dg Chest Port 1 View  10/22/2013   CLINICAL DATA:  Respiratory failure common pneumothorax  EXAM: PORTABLE CHEST - 1 VIEW  COMPARISON:  Prior chest x-ray 10/21/2013  FINDINGS: Left upper extremity PICC. Catheter tip in good position in the distal SVC. Unchanged position of right thoracostomy tube. The nasogastric tube is been advanced. The tip is now in the gastric fundus.  No definite right apical pneumothorax. Increased atelectasis in the right lung base. Otherwise, stable appearance of diffuse bilateral predominantly interstitial airspace opacities. Cardiac and mediastinal contours are unchanged. No acute osseous abnormality. Degenerative changes noted in both glenohumeral joints.  IMPRESSION: 1. The nasogastric tube is been advanced. The tip now overlies the gastric fundus. 2. No definite right-sided pneumothorax on today's examination. Thoracostomy tube remains in place. 3. Increased right basilar atelectasis. Otherwise, stable bilateral predominantly interstitial airspace disease. 4. Other support apparatus in stable and satisfactory position.   Electronically Signed   By: Malachy Moan M.D.   On: 10/22/2013 07:20   Chest Portable 1 View To Assess Tube Placement And Rule-out Pneumothorax  10/21/2013   CLINICAL DATA:  NG tube replacement.  EXAM: PORTABLE CHEST - 1 VIEW  COMPARISON:  One-view chest 10/21/2013  FINDINGS: Heart size is normal. Diffuse interstitial and airspace disease is similar to the prior exam. A left-sided PICC line is in place. The tracheostomy tube is stable. A right-sided chest tube is unchanged. A small right apical pneumothorax is stable. An NG tube is in place. The tip is at the level heart, beyond the carina, but well above the GE junction. The tube will need to be advanced nearly 20 cm for the side port to be be on the GE junction.  IMPRESSION: Add  1. The NG tube terminates in the distal esophagus and will need to be advanced  significantly for the side port to be beyond the GE junction. 2. Stable right apical pneumothorax. 3. Stable diffuse bilateral interstitial and airspace disease.   Electronically Signed   By: Gennette Pac M.D.   On: 10/21/2013 12:08   Dg Chest Port 1 View  10/21/2013   CLINICAL DATA:  Status post chest tube placement  EXAM: PORTABLE CHEST - 1 VIEW  COMPARISON:  Film from earlier in the same day  FINDINGS: A tracheostomy tube is now identified in satisfactory position 5.8 cm above the carina. A left-sided PICC line is again identified and stable. A new right-sided chest tube is been placed with near complete resolution of the previously seen pneumothorax. Only a small apical component remains. Diffuse bilateral infiltrates are again identified. No new bony abnormality is seen.  IMPRESSION: Near-complete resolution of right-sided pneumothorax following chest tube placement.  New tracheostomy catheter in satisfactory position.  The remainder of the exam is stable.   Electronically Signed   By: Alcide Clever M.D.   On: 10/21/2013 10:33   Dg Chest Port 1 View  10/21/2013   CLINICAL DATA:  Right pneumothorax  EXAM: PORTABLE CHEST - 1 VIEW  COMPARISON:  Film from earlier in the same day  FINDINGS: Cardiac shadow is stable. The endotracheal tube, PICC line and nasogastric catheter are all stable in appearance. Diffuse bilateral infiltrates are again identified. The previously described right-sided pneumothorax has increased when compared with the prior exam. There is now approximately 2 cm of excursion in the base laterally and a and apical component is now visualized. No acute bony abnormality is noted.  IMPRESSION: Increasing right pneumothorax.  Critical Value/emergent results were called by telephone at the time of interpretation on 10/21/2013 at 8:36 AM to [facility], the patient's nurse, who verbally acknowledged these results.   Electronically Signed   By: Alcide Clever M.D.   On: 10/21/2013 08:37   Dg Chest Port 1  View  10/21/2013   CLINICAL DATA:  Check endotracheal tube position  EXAM: PORTABLE CHEST - 1 VIEW  COMPARISON:  10/20/2013  FINDINGS: Endotracheal tube is noted 4.3 cm above the carina. A left-sided PICC line is seen in the distal superior vena cava. A nasogastric catheter is noted within stomach. Diffuse infiltrative changes are again identified bilaterally. Is small pneumothorax is noted in the right lung base laterally. No other pneumothorax is seen. No acute bony abnormality is noted.  IMPRESSION: New right basilar pneumothorax.  The remainder of the exam is stable from the prior study.  Critical Value/emergent results were called by telephone at the time of interpretation on 10/21/2013 at 7:00 AM to Tiffany the patient's nurse, who verbally acknowledged these results.   Electronically Signed   By: Alcide Clever M.D.   On: 10/21/2013 07:04     Assessment/Plan: AIDS VDRF  Pneumomediastinum, small R PTX  Parainfluenza  Candidemia  CMV viremia   Total days of antibiotics: Vanc 4/4 >>> off  Cefepime 4/4 >>> off  Micafungin 4/17 >>> 4/24  Gancyclovir 4/6 >>>  Diflucan 4/24 >>>  Zithromax4/19 >>>  Bactrim 4/4 >>>  HAART: DTGV/TRV   Fever has improved over last 24h His FiO2 is down to 35%, 5 of peep.  Pneumothorax resolved on today's CXR Will continue his current treatments, hopefully will tolerate lower FiO2 and Peep.         Ginnie Smart Infectious Diseases (pager) [phone] [email or web address] 10/22/2013, 2:57 PM  LOS: 21 days   **Disclaimer: This note may have been dictated with  voice recognition software. Similar sounding words can inadvertently be transcribed and this note may contain transcription errors which may not have been corrected upon publication of note.**

## 2013-10-22 NOTE — Progress Notes (Signed)
Spoke with [name] via phone.  [name] had specific questions as to what Pt's insurance covers for inpt hospital stays.  CSW encouraged [name] to contact Pt's insurance company directly, as CSW does not have knowledge of each insurances' coverage.  CSW did speak with [name] on how [facility].  CSW explained that Pt will have to wait until he starts receiving bills from the hospital before any help can be sought.  At that time, Pt (or wife) can contact the number listed on the bills and talk with a representative about setting up a payment plan.  CSW offered emotional support to [name] and encouraged her to notify the RN if she has any other CSW needs.  CSW thanked [name] for her time.  Providence CrosbyAmanda Amariah Kierstead, LCSWA Clinical Social Work [phone]

## 2013-10-22 NOTE — Progress Notes (Signed)
T/c with [name].  [name] told CSW that she was on the other line talking with Pt's MD.  CSW to call [name] back at a later time.  [name], LCSWA Clinical Social Work [phone]

## 2013-10-22 NOTE — Progress Notes (Signed)
Visited with patient's wife in patient's room.  Apparently there has been some medical progress and the wife Harmon Pier(Caryn) was in a very positive mood.  She was smiles and very conversational.  Talked for about an hour and covered varied topics.  She appreciated the company, as she was alone with her husband.    The progress she had seen brought her real hope for recovery.  She mentioned that her husband is on "so many prayer chains" and felt she was seeing the fruits of those prayers.  We discussed that there is still much ahead.  She could hear that.    Wife states she is Methodist, but mentioned no church,. And that [place] but with no teachings or affiliation growing up.  She stated her husband has examined Christianity, Catholicsm, Judaism, Buddhism.  He is also a Psychologist, prison and probation serviceshockey player and his physical condition should help him in rehab.  They have been married 20 years.  She had plenty of smiles throughout our conversation.  If situations should reverse for any reason, Chaplain is of the mind it may be devastating.  Rema Jasmineichard Laronica Bhagat, Chaplain Pager: [phone]

## 2013-10-22 NOTE — Progress Notes (Signed)
PULMONARY / CRITICAL CARE MEDICINE   Name: William Bender MRN: [id number] DOB: 1965/10/29    ADMISSION DATE:  10/15/2013 CONSULTATION DATE:  10/23/2013  REFERRING MD :  EDP  CHIEF COMPLAINT:  Shortness of breath, fever, cough  BRIEF PATIENT DESCRIPTION:  48 yo with recently diagnosis of HIV/AIDS (CD4 = 30 on 3/31) admitted 4/4 with acute hypoxemic respiratory failure and bilateral pneumonia. Of note PCP negative preadmission on Bactrim Px.  SIGNIFICANT / STUDIES EVENTS: 4/4    Chest CTA >>> No embolism, bilateral airspace disease 4/9    Chest CT >>> Pneumomediastinum, small R pneumothorax, bilateral airspace disease 4/15  Venous Doppler RUE >>> no DVT 4/18  TTE >>> EF 60%, no RWMA  LINES / TUBES: OETT 4/5 >>> 4/24 J CVL 4/5 >>> 4/17 L PICC 4/17 >>> Trach (JY) 4/24 >>> R chest tube 4/24 >>>  CULTURES: 4/4    CMV PCR >>> POSITIVE 4/5    AFB  bal >> 4/11  Resp virus panel >>> PARAINFLUENZA 2 4/11  AFB blood cx >>> 4/13  Fungal BAL >>> CANDIDA ALBICANS 4/16  Blood >>> CANDIDA ALBICANS 4/16  Sputum >>> neg 4/16  Urine >>> neg  ANTIBIOTICS: Vanc 4/4 >>> off Cefepime 4/4 >>> off Micafungin 4/17 >>> 4/24 Gancyclovir 4/6 >>> Diflucan 4/24 >>> Zithromax4/19 >>> Bactrim 4/4 >>> HAART  SUBJECTIVE: Tracheostomy / R chest tube on 4/24. Overnight noted TF in oropharynx / tracheal secretions.   VITAL SIGNS: Temp:  [97.9 F (36.6 C)-103.1 F (39.5 C)] 97.9 F (36.6 C) (04/25 0800) Pulse Rate:  [80-120] 80 (04/25 0800) Resp:  [16-21] 18 (04/25 0800) BP: (103-149)/(61-98) 119/79 mmHg (04/25 0800) SpO2:  [91 %-97 %] 97 % (04/25 0800) FiO2 (%):  [40 %] 40 % (04/25 0800) Weight:  [64.9 kg (143 lb 1.3 oz)] 64.9 kg (143 lb 1.3 oz) (04/25 0500)  VENTILATOR SETTINGS: Vent Mode:  [-] PCV FiO2 (%):  [40 %] 40 % Set Rate:  [18 bmp] 18 bmp PEEP:  [5 cmH20-10 cmH20] 5 cmH20 Plateau Pressure:  [20 cmH20-27 cmH20] 20 cmH20  INTAKE / OUTPUT: Intake/Output     04/24 0701 - 04/25 0700  04/25 0701 - 04/26 0700   I.V. (mL/kg) 450.7 (6.9) 86 (1.3)   Other 655 20   NG/GT 1495    IV Piggyback 700 100   Total Intake(mL/kg) 3300.7 (50.9) 206 (3.2)   Urine (mL/kg/hr) 4025 (2.6) 400 (1.9)   Stool 300 (0.2)    Total Output 4325 400   Net -1024.4 -194        Stool Occurrence 2 x     PHYSICAL EXAMINATION: Gen: Mechanically ventilated, synchronous HEENT: Tracheostomy site intact  PULM: Bilateral air entry, rhonchi / rales CV: Regular, no murmurs AB: Soft, non tender Neuro: Well sedated Ext: Trace edema Derm: Sacral decub  LABS: CBC  Recent Labs Lab 10/20/13 0535 10/21/13 0430 10/22/13 0130  WBC 10.6* 11.5* 6.2  HGB 9.4* 9.5* 8.2*  HCT 29.6* 30.2* 28.2*  PLT 247 290 245   Coag's  Recent Labs Lab 10/17/13 0620  INR 1.13   BMET  Recent Labs Lab 10/20/13 0535 10/21/13 0430 10/22/13 0130  NA 141 142 149*  K 3.8 3.4* 3.8  CL 90* 93* 104  CO2 41* 37* 37*  BUN 37* 64* 69*  CREATININE 0.59 1.01 1.11  GLUCOSE 186* 226* 228*   Electrolytes  Recent Labs Lab 10/20/13 0535 10/21/13 0430 10/22/13 0130  CALCIUM 8.1* 8.0* 8.5  MG 2.4 2.9* 2.9*  PHOS  4.2 5.0* 5.2*   Sepsis Markers No results found for this basename: LATICACIDVEN, PROCALCITON, O2SATVEN,  in the last 168 hours ABG  Recent Labs Lab 10/19/13 0436 10/20/13 0435 10/21/13 0415  PHART 7.408 7.384 7.386  PCO2ART 63.7* 76.1* 67.6*  PO2ART 76.2* 104.0* 95.1   Liver Enzymes  Recent Labs Lab 10/18/13 0530  AST 35  ALT 34  ALKPHOS 101  BILITOT <0.2*  ALBUMIN 1.2*   Cardiac Enzymes  Recent Labs Lab 10/22/13 0130 10/22/13 0750  TROPONINI <0.30 <0.30   Glucose  Recent Labs Lab 10/21/13 0728 10/21/13 1149 10/21/13 1554 10/21/13 2004 10/22/13 0007 10/22/13 0334  GLUCAP 187* 177* 202* 199* 193* 213*   IMAGING:  Dg Abd 1 View  10/21/2013   CLINICAL DATA:  48 year old male status post NG tube advancement. Initial encounter.  EXAM: ABDOMEN - 1 VIEW  COMPARISON:  1146 hr  chest radiograph from the same day and earlier.  FINDINGS: Portable AP supine view at 1250 hrs. The enteric tube is been advanced, the tip now projects in the gastric cardia. The side hole probably is still proximal to the GE junction. Recommend advancing 5 more cm to place the side hole definitely within the stomach.  Non obstructed bowel gas pattern.  IMPRESSION: NG tube tip just inside the stomach. Advance 5 additional cm for more optimal placement.   Electronically Signed   By: Augusto Gamble M.D.   On: 10/21/2013 13:00   Dg Abd 1 View  10/21/2013   CLINICAL DATA:  48 year old male nasogastric tube placement. Initial encounter.  EXAM: ABDOMEN - 1 VIEW  COMPARISON:  Chest radiographs 1020 hr the same day. Abdomen film 10/05/2013.  FINDINGS: Portable AP supine view at 1108 hrs. Mild motion artifact. No enteric tube identified in the abdomen or visible lower chest. Some EKG leads and wires are present. No enteric tube identified on the recent chest radiograph. Non obstructed bowel gas pattern.  IMPRESSION: No enteric tube identified on this view of the abdomen.  Study discussed by telephone with the patient's RN at the bedside on 10/21/2013 at 11:18.  She reports that clinically it seems the tube is appropriately placed.  I advised a followup portable chest view to further evaluate, and that is being arranged at the time of this report.   Electronically Signed   By: Augusto Gamble M.D.   On: 10/21/2013 11:21   Dg Chest Port 1 View  10/22/2013   CLINICAL DATA:  Respiratory failure common pneumothorax  EXAM: PORTABLE CHEST - 1 VIEW  COMPARISON:  Prior chest x-ray 10/21/2013  FINDINGS: Left upper extremity PICC. Catheter tip in good position in the distal SVC. Unchanged position of right thoracostomy tube. The nasogastric tube is been advanced. The tip is now in the gastric fundus.  No definite right apical pneumothorax. Increased atelectasis in the right lung base. Otherwise, stable appearance of diffuse bilateral  predominantly interstitial airspace opacities. Cardiac and mediastinal contours are unchanged. No acute osseous abnormality. Degenerative changes noted in both glenohumeral joints.  IMPRESSION: 1. The nasogastric tube is been advanced. The tip now overlies the gastric fundus. 2. No definite right-sided pneumothorax on today's examination. Thoracostomy tube remains in place. 3. Increased right basilar atelectasis. Otherwise, stable bilateral predominantly interstitial airspace disease. 4. Other support apparatus in stable and satisfactory position.   Electronically Signed   By: Malachy Moan M.D.   On: 10/22/2013 07:20   Chest Portable 1 View To Assess Tube Placement And Rule-out Pneumothorax  10/21/2013   CLINICAL  DATA:  NG tube replacement.  EXAM: PORTABLE CHEST - 1 VIEW  COMPARISON:  One-view chest 10/21/2013  FINDINGS: Heart size is normal. Diffuse interstitial and airspace disease is similar to the prior exam. A left-sided PICC line is in place. The tracheostomy tube is stable. A right-sided chest tube is unchanged. A small right apical pneumothorax is stable. An NG tube is in place. The tip is at the level heart, beyond the carina, but well above the GE junction. The tube will need to be advanced nearly 20 cm for the side port to be be on the GE junction.  IMPRESSION: Add  1. The NG tube terminates in the distal esophagus and will need to be advanced significantly for the side port to be beyond the GE junction. 2. Stable right apical pneumothorax. 3. Stable diffuse bilateral interstitial and airspace disease.   Electronically Signed   By: Gennette Pachris  Mattern M.D.   On: 10/21/2013 12:08   Dg Chest Port 1 View  10/21/2013   CLINICAL DATA:  Status post chest tube placement  EXAM: PORTABLE CHEST - 1 VIEW  COMPARISON:  Film from earlier in the same day  FINDINGS: A tracheostomy tube is now identified in satisfactory position 5.8 cm above the carina. A left-sided PICC line is again identified and stable. A new  right-sided chest tube is been placed with near complete resolution of the previously seen pneumothorax. Only a small apical component remains. Diffuse bilateral infiltrates are again identified. No new bony abnormality is seen.  IMPRESSION: Near-complete resolution of right-sided pneumothorax following chest tube placement.  New tracheostomy catheter in satisfactory position.  The remainder of the exam is stable.   Electronically Signed   By: Alcide CleverMark  Lukens M.D.   On: 10/21/2013 10:33   Dg Chest Port 1 View  10/21/2013   CLINICAL DATA:  Right pneumothorax  EXAM: PORTABLE CHEST - 1 VIEW  COMPARISON:  Film from earlier in the same day  FINDINGS: Cardiac shadow is stable. The endotracheal tube, PICC line and nasogastric catheter are all stable in appearance. Diffuse bilateral infiltrates are again identified. The previously described right-sided pneumothorax has increased when compared with the prior exam. There is now approximately 2 cm of excursion in the base laterally and a and apical component is now visualized. No acute bony abnormality is noted.  IMPRESSION: Increasing right pneumothorax.  Critical Value/emergent results were called by telephone at the time of interpretation on 10/21/2013 at 8:36 AM to [facility], the patient's nurse, who verbally acknowledged these results.   Electronically Signed   By: Alcide CleverMark  Lukens M.D.   On: 10/21/2013 08:37   Dg Chest Port 1 View  10/21/2013   CLINICAL DATA:  Check endotracheal tube position  EXAM: PORTABLE CHEST - 1 VIEW  COMPARISON:  10/20/2013  FINDINGS: Endotracheal tube is noted 4.3 cm above the carina. A left-sided PICC line is seen in the distal superior vena cava. A nasogastric catheter is noted within stomach. Diffuse infiltrative changes are again identified bilaterally. Is small pneumothorax is noted in the right lung base laterally. No other pneumothorax is seen. No acute bony abnormality is noted.  IMPRESSION: New right basilar pneumothorax.  The remainder of the  exam is stable from the prior study.  Critical Value/emergent results were called by telephone at the time of interpretation on 10/21/2013 at 7:00 AM to Tiffany the patient's nurse, who verbally acknowledged these results.   Electronically Signed   By: Alcide CleverMark  Lukens M.D.   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 P:   Goal pH>7.30, SpO2>92 Continuous mechanical support VAP bundle Start daily SBT after sedation decreased Trend ABG/CXR Decrease Solu-Medrol 40 q12h, and titrate to off Chest tube Albuterol PRN  CARDIOVASCULAR A:  Severe sepsis, resolved ST-T changes noted 4/24 but troponin negative x 2 H/o HTN, normotensive now P:  Goal MAP>65 D/c Metoprolol  RENAL A:   Hypernatremia Contraction alkalosis  P:   Hold further diuresis Free water 200 q8h Trend BMP  GASTROINTESTINAL A:   High NGT ( side port possibly above diaphragm ) Protein calorie malnutrition GI Px P:   Advance NGT 10 cm Restart TF Protonix  HEMATOLOGIC A:   Anemia of critical illness VTE Px P:  Trend CBC Lovenox  INFECTIOUS A:   HIV / AIDS Parainfluenza pneumonia Less likely PCP pneumonia ( given preadmission PCP negative and Bactrim Px) CMV viremia Candida dfungimia Stage II buttock ulcers without evidence of infaction P:   ID input appreciated  HAART Azithromycin  / Bactrim Px Ganciclovir Diflucan   ENDOCRINE A:   Steroid induced hyperglycemia P:   SSI Add Lantus 10 Decrease steroids as above  NEUROLOGIC A:    Acute encephalopathy P:   Goal RASS 0 to -2 Dilaudid titrate down as tolerated D/c Propofol ( not on but ordered ) Versed titrated down as tolerated Seroquel May meed Precedex  I have personally obtained history, examined patient, evaluated and interpreted  laboratory and imaging results, reviewed medical records, formulated assessment / plan and placed orders.  CRITICAL CARE:  The patient is critically ill with multiple organ systems failure and requires high complexity decision making for assessment and support, frequent evaluation and titration of therapies, application of advanced monitoring technologies and extensive interpretation of multiple databases. Critical Care Time devoted to patient care services described in this note is 45 minutes.   Lonia FarberKonstantin Rikita Grabert, MD Pulmonary and Critical Care Medicine [facility] Pager: [phone]  10/22/2013, 10:43 AM

## 2013-10-22 NOTE — Progress Notes (Signed)
Pt with ST changes noted tonight.  HR 100-115, BP 125/80, sats 96 on 40%, temp 102-103 F all evening.  12-lead completed and showed acute MI/ STEMI.  Possible lateral infarct.  Dr. Darrick Pennaeterding notified.  Troponins drawn and pending.   Awaiting further orders.

## 2013-10-23 ENCOUNTER — Inpatient Hospital Stay (HOSPITAL_COMMUNITY): Payer: BC Managed Care – PPO

## 2013-10-23 DIAGNOSIS — R0602 Shortness of breath: Secondary | ICD-10-CM

## 2013-10-23 DIAGNOSIS — R509 Fever, unspecified: Secondary | ICD-10-CM

## 2013-10-23 LAB — BASIC METABOLIC PANEL
BUN: 60 mg/dL — ABNORMAL HIGH (ref 6–23)
CALCIUM: 8.6 mg/dL (ref 8.4–10.5)
CO2: 34 mEq/L — ABNORMAL HIGH (ref 19–32)
Chloride: 106 mEq/L (ref 96–112)
Creatinine, Ser: 0.79 mg/dL (ref 0.50–1.35)
GFR calc Af Amer: >90 mL/min (ref >90)
GFR calc non Af Amer: >90 mL/min (ref >90)
Glucose, Bld: 205 mg/dL — ABNORMAL HIGH (ref 70–99)
Potassium: 3.4 mEq/L — ABNORMAL LOW (ref 3.7–5.3)
Sodium: 151 mEq/L — ABNORMAL HIGH (ref 137–147)

## 2013-10-23 LAB — GLUCOSE, CAPILLARY
GLUCOSE-CAPILLARY: 190 mg/dL — AB (ref 70–99)
Glucose-Capillary: 151 mg/dL — ABNORMAL HIGH (ref 70–99)
Glucose-Capillary: 159 mg/dL — ABNORMAL HIGH (ref 70–99)
Glucose-Capillary: 189 mg/dL — ABNORMAL HIGH (ref 70–99)
Glucose-Capillary: 192 mg/dL — ABNORMAL HIGH (ref 70–99)
Glucose-Capillary: 225 mg/dL — ABNORMAL HIGH (ref 70–99)

## 2013-10-23 LAB — CULTURE, BLOOD (ROUTINE X 2)
Culture: NO GROWTH
Culture: NO GROWTH

## 2013-10-23 LAB — LIPASE, BLOOD: Lipase: 32 U/L (ref 11–59)

## 2013-10-23 LAB — AMYLASE: AMYLASE: 48 U/L (ref 0–105)

## 2013-10-23 MED ORDER — FREE WATER
200.0000 mL | Status: DC
  Administered 2013-10-24 – 2013-11-05 (×73): 200 mL

## 2013-10-23 MED ORDER — DEXMEDETOMIDINE HCL IN NACL 200 MCG/50ML IV SOLN
0.4000 ug/kg/h | INTRAVENOUS | Status: DC
  Administered 2013-10-23: 0.502 ug/kg/h via INTRAVENOUS
  Administered 2013-10-23: 0.5 ug/kg/h via INTRAVENOUS
  Administered 2013-10-24: 1.2 ug/kg/h via INTRAVENOUS
  Administered 2013-10-24 (×4): 0.9 ug/kg/h via INTRAVENOUS
  Administered 2013-10-25 (×3): 1.2 ug/kg/h via INTRAVENOUS
  Filled 2013-10-23 (×7): qty 50
  Filled 2013-10-23: qty 100
  Filled 2013-10-23 (×3): qty 50

## 2013-10-23 MED ORDER — METOPROLOL TARTRATE 1 MG/ML IV SOLN
5.0000 mg | Freq: Four times a day (QID) | INTRAVENOUS | Status: DC
Start: 2013-10-23 — End: 2013-10-24
  Administered 2013-10-23 (×4): 5 mg via INTRAVENOUS
  Filled 2013-10-23 (×9): qty 5

## 2013-10-23 NOTE — Progress Notes (Addendum)
INFECTIOUS DISEASE PROGRESS NOTE  ID: William Bender is a 48 y.o. male with  Principal Problem:   Pneumonia Active Problems:   Acute respiratory failure   AIDS   PCP (pneumocystis carinii pneumonia)   CMV (cytomegalovirus)   Sacral decubitus ulcer, stage II   Hyponatremia   Pneumomediastinum   Fungemia   Spontaneous pneumothorax  Subjective: Weaning sedation this AM.  Hypertensive, SpO2 40%  Abtx:  Anti-infectives   Start     Dose/Rate Route Frequency Ordered Stop   10/22/13 1000  vancomycin (VANCOCIN) IVPB 750 mg/150 ml premix  Status:  Discontinued     750 mg 150 mL/hr over 60 Minutes Intravenous Every 12 hours 10/21/13 1318 10/21/13 1550   10/21/13 2200  micafungin (MYCAMINE) 100 mg in sodium chloride 0.9 % 100 mL IVPB  Status:  Discontinued     100 mg 100 mL/hr over 1 Hours Intravenous Daily at bedtime 10/21/13 1342 10/21/13 1550   10/21/13 2200  fluconazole (DIFLUCAN) IVPB 400 mg     400 mg 100 mL/hr over 120 Minutes Intravenous Every 24 hours 10/21/13 1615     10/19/13 2000  vancomycin (VANCOCIN) 1,250 mg in sodium chloride 0.9 % 250 mL IVPB  Status:  Discontinued     1,250 mg 166.7 mL/hr over 90 Minutes Intravenous Every 8 hours 10/19/13 1736 10/21/13 1318   10/19/13 1830  ceFEPIme (MAXIPIME) 1 g in dextrose 5 % 50 mL IVPB  Status:  Discontinued     1 g 100 mL/hr over 30 Minutes Intravenous 3 times per day 10/19/13 1736 10/21/13 1550   10/18/13 1600  sulfamethoxazole-trimethoprim (BACTRIM DS) 800-160 MG per tablet 1 tablet     1 tablet Per Tube Daily 10/18/13 1550     10/15/13 0100  micafungin (MYCAMINE) 150 mg in sodium chloride 0.9 % 100 mL IVPB  Status:  Discontinued     150 mg 100 mL/hr over 1 Hours Intravenous Daily at bedtime 10/15/13 0056 10/21/13 1342   10/13/13 1800  vancomycin (VANCOCIN) IVPB 1000 mg/200 mL premix  Status:  Discontinued     1,000 mg 200 mL/hr over 60 Minutes Intravenous Every 8 hours 10/13/13 0836 10/15/13 1033   10/13/13 1800   ceFEPIme (MAXIPIME) 2 g in dextrose 5 % 50 mL IVPB  Status:  Discontinued     2 g 100 mL/hr over 30 Minutes Intravenous Every 8 hours 10/13/13 0836 10/15/13 1033   10/13/13 0900  vancomycin (VANCOCIN) 1,750 mg in sodium chloride 0.9 % 500 mL IVPB     1,750 mg 250 mL/hr over 120 Minutes Intravenous  Once 10/13/13 0836 10/13/13 1200   10/13/13 0900  ceFEPIme (MAXIPIME) 2 g in dextrose 5 % 50 mL IVPB     2 g 100 mL/hr over 30 Minutes Intravenous  Once 10/13/13 0836 10/13/13 1000   10/11/13 1600  fluconazole (DIFLUCAN) tablet 100 mg  Status:  Discontinued     100 mg Oral Weekly 10/11/13 1553 10/15/13 1033   10/08/13 1400  ceFEPIme (MAXIPIME) 1 g in dextrose 5 % 50 mL IVPB  Status:  Discontinued     1 g 100 mL/hr over 30 Minutes Intravenous 3 times per day 10/08/13 1119 10/11/13 1553   10/06/13 1000  azithromycin (ZITHROMAX) tablet 1,200 mg     1,200 mg Oral Weekly 27-Oct-2013 1355     10/03/13 1800  ganciclovir (CYTOVENE) 385 mg in sodium chloride 0.9 % 100 mL IVPB     5 mg/kg  76.5 kg 100 mL/hr over 60  Minutes Intravenous Every 12 hours 10/03/13 1723     10/03/13 1000  dolutegravir (TIVICAY) tablet 50 mg    Comments:  GIVE DOWN THE NG TUBE   50 mg Oral Daily 10/02/13 1554     10/03/13 1000  emtricitabine-tenofovir (TRUVADA) 200-300 MG per tablet 1 tablet     1 tablet Oral Daily 10/02/13 1554     10/02/13 0800  elvitegravir-cobicistat-emtricitabine-tenofovir (STRIBILD) 150-150-200-300 MG tablet 1 tablet  Status:  Discontinued     1 tablet Oral Daily with breakfast 27-May-2014 1355 10/02/13 1554   27-May-2014 2200  ceFEPIme (MAXIPIME) 1 g in dextrose 5 % 50 mL IVPB  Status:  Discontinued     1 g 100 mL/hr over 30 Minutes Intravenous 3 times per day 27-May-2014 1141 10/08/13 0934   27-May-2014 2000  vancomycin (VANCOCIN) IVPB 750 mg/150 ml premix  Status:  Discontinued     750 mg 150 mL/hr over 60 Minutes Intravenous Every 8 hours 27-May-2014 1141 27-May-2014 1444   27-May-2014 2000  sulfamethoxazole-trimethoprim  (BACTRIM) 450 mg in dextrose 5 % 500 mL IVPB  Status:  Discontinued     450 mg 352.1 mL/hr over 90 Minutes Intravenous Every 8 hours 27-May-2014 1141 10/18/13 1550   27-May-2014 1115  ceFEPIme (MAXIPIME) 2 g in dextrose 5 % 50 mL IVPB  Status:  Discontinued     2 g 100 mL/hr over 30 Minutes Intravenous Every 12 hours 27-May-2014 1104 27-May-2014 1141   27-May-2014 1100  sulfamethoxazole-trimethoprim (BACTRIM) 450 mg in dextrose 5 % 500 mL IVPB     450 mg 352.1 mL/hr over 90 Minutes Intravenous  Once 27-May-2014 1054 10/04/13 1643   27-May-2014 1045  vancomycin (VANCOCIN) 1,500 mg in sodium chloride 0.9 % 500 mL IVPB     1,500 mg 250 mL/hr over 120 Minutes Intravenous  Once 27-May-2014 1038 27-May-2014 1300   27-May-2014 1030  vancomycin (VANCOCIN) IVPB 1000 mg/200 mL premix  Status:  Discontinued     1,000 mg 200 mL/hr over 60 Minutes Intravenous  Once 27-May-2014 1024 27-May-2014 1035      Medications:  Scheduled: . antiseptic oral rinse  15 mL Mouth Rinse QID  . azithromycin  1,200 mg Oral Weekly  . chlorhexidine  15 mL Mouth Rinse BID  . collagenase   Topical Daily  . docusate  100 mg Oral BID  . dolutegravir  50 mg Oral Daily  . emtricitabine-tenofovir  1 tablet Oral Daily  . enoxaparin (LOVENOX) injection  40 mg Subcutaneous Daily  . etomidate  40 mg Intravenous Once  . feeding supplement (PRO-STAT SUGAR FREE 64)  30 mL Per Tube BID  . fluconazole (DIFLUCAN) IV  400 mg Intravenous Q24H  . free water  200 mL Per Tube 3 times per day  . ganciclovir (CYTOVENE) IV  5 mg/kg Intravenous Q12H  . insulin aspart  0-15 Units Subcutaneous 6 times per day  . insulin glargine  10 Units Subcutaneous QHS  . methylPREDNISolone (SOLU-MEDROL) injection  40 mg Intravenous Q12H  . metoprolol  5 mg Intravenous 4 times per day  . multivitamin  5 mL Per Tube Daily  . pantoprazole sodium  40 mg Per Tube Daily  . QUEtiapine  50 mg Per Tube BID  . sodium chloride  10-40 mL Intracatheter Q12H  . sulfamethoxazole-trimethoprim  1  tablet Per Tube Daily    Objective: Vital signs in last 24 hours: Temp:  [97.2 F (36.2 C)-103.3 F (39.6 C)] 102.8 F (39.3 C) (04/26 0900) Pulse Rate:  [73-133]  112 (04/26 1000) Resp:  [11-23] 17 (04/26 1000) BP: (124-176)/(79-126) 144/82 mmHg (04/26 1000) SpO2:  [89 %-100 %] 92 % (04/26 1000) FiO2 (%):  [30 %-40 %] 40 % (04/26 0800) Weight:  [65.3 kg (143 lb 15.4 oz)] 65.3 kg (143 lb 15.4 oz) (04/26 0400)   General appearance: no distress Resp: rhonchi bilaterally Cardio: tachycardic GI: normal findings: bowel sounds normal and soft, non-tender Extremities: LUE pic is clean.   Lab Results  Recent Labs  10/21/13 0430 10/22/13 0130 10/23/13 0850  WBC 11.5* 6.2  --   HGB 9.5* 8.2*  --   HCT 30.2* 28.2*  --   NA 142 149* 151*  K 3.4* 3.8 3.4*  CL 93* 104 106  CO2 37* 37* 34*  BUN 64* 69* 60*  CREATININE 1.01 1.11 0.79   Liver Panel No results found for this basename: PROT, ALBUMIN, AST, ALT, ALKPHOS, BILITOT, BILIDIR, IBILI,  in the last 72 hours Sedimentation Rate No results found for this basename: ESRSEDRATE,  in the last 72 hours C-Reactive Protein No results found for this basename: CRP,  in the last 72 hours  Microbiology: Recent Results (from the past 240 hour(s))  URINE CULTURE     Status: None   Collection Time    10/13/13 12:27 PM      Result Value Ref Range Status   Specimen Description URINE, CATHETERIZED   Final   Special Requests NONE   Final   Culture  Setup Time     Final   Value: 10/13/2013 14:45     [facility]   Colony Count     Final   Value: 5,000 COLONIES/ML     [facility]   Culture     Final   Value: INSIGNIFICANT GROWTH     [facility]   Report Status 10/14/2013 FINAL   Final  CULTURE, RESPIRATORY (NON-EXPECTORATED)     Status: None   Collection Time    10/13/13  4:04 PM      Result Value Ref Range Status   Specimen Description ENDOTRACHEAL   Final   Special  Requests NONE   Final   Gram Stain     Final   Value: ABUNDANT WBC PRESENT,BOTH PMN AND MONONUCLEAR     NO SQUAMOUS EPITHELIAL CELLS SEEN     NO ORGANISMS SEEN     [facility]formed at [facility]   Culture     Final   Value: Non-Pathogenic Oropharyngeal-type Flora Isolated.     Performed at [facility]   Report Status 10/16/2013 FINAL   Final  URINE CULTURE     Status: None   Collection Time    10/17/13  4:59 PM      Result Value Ref Range Status   Specimen Description URINE, CATHETERIZED   Final   Special Requests Normal   Final   Culture  Setup Time     Final   Value: 10/18/2013 02:13     Performed at [facility]   Colony Count     Final   Value: NO GROWTH     [facility]formed at [facility]   Culture     Final   Value: NO GROWTH     Performed at [facility]   Report Status 10/18/2013 FINAL   Final  CULTURE, BLOOD (ROUTINE X 2)     Status: None   Collection Time    10/17/13  5:00  PM      Result Value Ref Range Status   Specimen Description BLOOD RIGHT RIGHT ANTECUBITAL   Final   Special Requests BOTTLES DRAWN AEROBIC AND ANAEROBIC 10CC   Final   Culture  Setup Time     Final   Value: 10/17/2013 18:56     Performed at [facility]   Culture     Final   Value:        BLOOD CULTURE RECEIVED NO GROWTH TO DATE CULTURE WILL BE HELD FOR 5 DAYS BEFORE ISSUING A FINAL NEGATIVE REPORT     Performed at [facility]   Report Status PENDING   Incomplete  CULTURE, RESPIRATORY (NON-EXPECTORATED)     Status: None   Collection Time    10/17/13  5:00 PM      Result Value Ref Range Status   Specimen Description TRACHEAL ASPIRATE   Final   Special Requests NONE   Final   Gram Stain     Final   Value: MODERATE WBC PRESENT, PREDOMINANTLY PMN     NO SQUAMOUS EPITHELIAL CELLS SEEN     NO ORGANISMS SEEN     Performed at [facility]   Culture     Final   Value: Non-Pathogenic Oropharyngeal-type Flora Isolated.     Performed at  [facility]   Report Status 10/19/2013 FINAL   Final  CULTURE, BLOOD (ROUTINE X 2)     Status: None   Collection Time    10/17/13  5:05 PM      Result Value Ref Range Status   Specimen Description BLOOD RIGHT ARM   Final   Special Requests BOTTLES DRAWN AEROBIC AND ANAEROBIC 10CC   Final   Culture  Setup Time     Final   Value: 10/17/2013 18:56     Performed at [facility]   Culture     Final   Value:        BLOOD CULTURE RECEIVED NO GROWTH TO DATE CULTURE WILL BE HELD FOR 5 DAYS BEFORE ISSUING A FINAL NEGATIVE REPORT     Performed at [facility]   Report Status PENDING   Incomplete  CULTURE, RESPIRATORY (NON-EXPECTORATED)     Status: None   Collection Time    10/19/13  5:53 PM      Result Value Ref Range Status   Specimen Description TRACHEAL ASPIRATE   Final   Special Requests NONE   Final   Gram Stain     Final   Value: ABUNDANT WBC PRESENT,BOTH PMN AND MONONUCLEAR     NO SQUAMOUS EPITHELIAL CELLS SEEN     NO ORGANISMS SEEN     Performed at [facility]   Culture     Final   Value: Non-Pathogenic Oropharyngeal-type Flora Isolated.     Performed at [facility]   Report Status 10/22/2013 FINAL   Final  CULTURE, BLOOD (ROUTINE X 2)     Status: None   Collection Time    10/19/13  5:55 PM      Result Value Ref Range Status   Specimen Description BLOOD RIGHT ARM   Final   Special Requests BOTTLES DRAWN AEROBIC AND ANAEROBIC 8CC   Final   Culture  Setup Time     Final   Value: 10/19/2013 21:55     Performed at [facility]   Culture     Final   Value:        BLOOD CULTURE  RECEIVED NO GROWTH TO DATE CULTURE WILL BE HELD FOR 5 DAYS BEFORE ISSUING A FINAL NEGATIVE REPORT     Performed at [facility]   Report Status PENDING   Incomplete  CULTURE, BLOOD (ROUTINE X 2)     Status: None   Collection Time    10/19/13  6:00 PM      Result Value Ref Range Status   Specimen Description BLOOD RIGHT ARM   Final   Special  Requests BOTTLES DRAWN AEROBIC ONLY 2CC   Final   Culture  Setup Time     Final   Value: 10/19/2013 21:55     Performed at [facility]   Culture     Final   Value:        BLOOD CULTURE RECEIVED NO GROWTH TO DATE CULTURE WILL BE HELD FOR 5 DAYS BEFORE ISSUING A FINAL NEGATIVE REPORT     Performed at [facility]   Report Status PENDING   Incomplete  CLOSTRIDIUM DIFFICILE BY PCR     Status: None   Collection Time    10/20/13  6:10 AM      Result Value Ref Range Status   C difficile by pcr NEGATIVE  NEGATIVE Final   Comment: Performed at [facility]    Studies/Results: Dg Abd 1 View  10/22/2013   CLINICAL DATA:  Feeding tube placement.  EXAM: ABDOMEN - 1 VIEW  COMPARISON:  10/21/2013  FINDINGS: Small caliber enteric tube has been removed and a weighted feeding tube has been placed. The tip of the tube projects just beneath the left hemidiaphragm in the expected region of the gastric fundus. No dilated loops of bowel are seen. There is elevation of the right hemidiaphragm with right greater than left basilar lung opacities, more fully evaluated on chest radiograph earlier today.  IMPRESSION: Feeding tube placement with tip overlying the gastric fundus.   Electronically Signed   By: Sebastian Ache   On: 10/22/2013 13:31   Dg Abd 1 View  10/21/2013   CLINICAL DATA:  48 year old male status post NG tube advancement. Initial encounter.  EXAM: ABDOMEN - 1 VIEW  COMPARISON:  1146 hr chest radiograph from the same day and earlier.  FINDINGS: Portable AP supine view at 1250 hrs. The enteric tube is been advanced, the tip now projects in the gastric cardia. The side hole probably is still proximal to the GE junction. Recommend advancing 5 more cm to place the side hole definitely within the stomach.  Non obstructed bowel gas pattern.  IMPRESSION: NG tube tip just inside the stomach. Advance 5 additional cm for more optimal placement.   Electronically Signed   By: Augusto Gamble M.D.   On:  10/21/2013 13:00   Dg Abd 1 View  10/21/2013   CLINICAL DATA:  48 year old male nasogastric tube placement. Initial encounter.  EXAM: ABDOMEN - 1 VIEW  COMPARISON:  Chest radiographs 1020 hr the same day. Abdomen film 10/05/2013.  FINDINGS: Portable AP supine view at 1108 hrs. Mild motion artifact. No enteric tube identified in the abdomen or visible lower chest. Some EKG leads and wires are present. No enteric tube identified on the recent chest radiograph. Non obstructed bowel gas pattern.  IMPRESSION: No enteric tube identified on this view of the abdomen.  Study discussed by telephone with the patient's RN at the bedside on 10/21/2013 at 11:18.  She reports that clinically it seems the tube is appropriately placed.  I advised a followup portable chest  view to further evaluate, and that is being arranged at the time of this report.   Electronically Signed   By: Augusto Gamble M.D.   On: 10/21/2013 11:21   Dg Chest Port 1 View  10/22/2013   CLINICAL DATA:  Respiratory failure common pneumothorax  EXAM: PORTABLE CHEST - 1 VIEW  COMPARISON:  Prior chest x-ray 10/21/2013  FINDINGS: Left upper extremity PICC. Catheter tip in good position in the distal SVC. Unchanged position of right thoracostomy tube. The nasogastric tube is been advanced. The tip is now in the gastric fundus.  No definite right apical pneumothorax. Increased atelectasis in the right lung base. Otherwise, stable appearance of diffuse bilateral predominantly interstitial airspace opacities. Cardiac and mediastinal contours are unchanged. No acute osseous abnormality. Degenerative changes noted in both glenohumeral joints.  IMPRESSION: 1. The nasogastric tube is been advanced. The tip now overlies the gastric fundus. 2. No definite right-sided pneumothorax on today's examination. Thoracostomy tube remains in place. 3. Increased right basilar atelectasis. Otherwise, stable bilateral predominantly interstitial airspace disease. 4. Other support  apparatus in stable and satisfactory position.   Electronically Signed   By: Malachy Moan M.D.   On: 10/22/2013 07:20   Chest Portable 1 View To Assess Tube Placement And Rule-out Pneumothorax  10/21/2013   CLINICAL DATA:  NG tube replacement.  EXAM: PORTABLE CHEST - 1 VIEW  COMPARISON:  One-view chest 10/21/2013  FINDINGS: Heart size is normal. Diffuse interstitial and airspace disease is similar to the prior exam. A left-sided PICC line is in place. The tracheostomy tube is stable. A right-sided chest tube is unchanged. A small right apical pneumothorax is stable. An NG tube is in place. The tip is at the level heart, beyond the carina, but well above the GE junction. The tube will need to be advanced nearly 20 cm for the side port to be be on the GE junction.  IMPRESSION: Add  1. The NG tube terminates in the distal esophagus and will need to be advanced significantly for the side port to be beyond the GE junction. 2. Stable right apical pneumothorax. 3. Stable diffuse bilateral interstitial and airspace disease.   Electronically Signed   By: Gennette Pac M.D.   On: 10/21/2013 12:08     Assessment/Plan: Fever AIDS VDRF VDRF   Pneumomediastinum, small R PTX   Parainfluenza   Candidemia   CMV viremia Decubitus ulcer  Total days of antibiotics Vanc 4/4 >>> 4/24 Cefepime 4/4 >>> 4/24 Micafungin 4/17 >>> 4/24  Gancyclovir 4/6 >>>  Diflucan 4/24 >>>  Zithromax4/19 >>> (prophylaxis) Bactrim 4/4 >>> (prophylaxis) HAART: DTGV/TRV  Will check amylase, lipase Will repeat his Cx Lower and upper extr dopplers (on LMWH) ? IRIS Pull PIC? Agree with taper steroids as PCP BAL was (-) CT sinus/chest/abd/pelvis if fever continues?         Ginnie Smart Infectious Diseases (pager) [phone] [email or web address] 10/23/2013, 10:56 AM  LOS: 22 days   **Disclaimer: This note may have been dictated with voice recognition software. Similar sounding words can inadvertently be  transcribed and this note may contain transcription errors which may not have been corrected upon publication of note.**

## 2013-10-23 NOTE — Progress Notes (Signed)
VASCULAR LAB PRELIMINARY  PRELIMINARY  PRELIMINARY  PRELIMINARY  Bilateral upper and lower extremity venous Dopplers completed.    Preliminary report:  There is no obvious evidence of DVT or SVT noted in the bilateral lower extremity veins.  There is no obvious evidence of DVT or SVT noted in the bilateral upper extremities.  It is difficult to compress the left axillary vein secondary to PICC line, but no obvious thrombus is noted.  Kern AlbertaCandace R Daniyla Pfahler, RVT 10/23/2013, 5:44 PM

## 2013-10-23 NOTE — Progress Notes (Signed)
eLink Physician-Brief Progress Note Patient Name: Tennis MustBrent Wann DOB: 06/02/1966 MRN: [id number]  Date of Service  10/23/2013   HPI/Events of Note   Hypernatremia  eICU Interventions  Double free water   Intervention Category Minor Interventions: Electrolytes abnormality - evaluation and management  Lupita LeashDouglas B Anamari Galeas 10/23/2013, 11:18 PM

## 2013-10-23 NOTE — Progress Notes (Addendum)
PULMONARY / CRITICAL CARE MEDICINE   Name: William Bender MRN: [id number] DOB: 07/13/1965    ADMISSION DATE:  10/04/2013 CONSULTATION DATE:  10/21/2013  REFERRING MD :  EDP  CHIEF COMPLAINT:  Shortness of breath, fever, cough  BRIEF PATIENT DESCRIPTION:  48 yo with recently diagnosis of HIV/AIDS (CD4 = 30 on 3/31) admitted 4/4 with acute hypoxemic respiratory failure and bilateral pneumonia. Of note PCP negative preadmission on Bactrim Px.  SIGNIFICANT / STUDIES EVENTS: 4/4    Chest CTA >>> No embolism, bilateral airspace disease 4/9    Chest CT >>> Pneumomediastinum, small R pneumothorax, bilateral airspace disease 4/15  Venous Doppler RUE >>> no DVT 4/18  TTE >>> EF 60%, no RWMA  LINES / TUBES: OETT 4/5 >>> 4/24 J CVL 4/5 >>> 4/17 L PICC 4/17 >>> Trach (JY) 4/24 >>> R chest tube 4/24 >>>  CULTURES: 4/4    CMV PCR >>> POSITIVE 4/5    AFB  bal >> 4/11  Resp virus panel >>> PARAINFLUENZA 2 4/11  AFB blood cx >>> 4/13  Fungal BAL >>> CANDIDA ALBICANS 4/16  Blood >>> CANDIDA ALBICANS 4/16  Sputum >>> neg 4/16  Urine >>> neg  ANTIBIOTICS: Vanc 4/4 >>> off Cefepime 4/4 >>> off Micafungin 4/17 >>> 4/24 Gancyclovir 4/6 >>> Diflucan 4/24 >>> Zithromax4/19 >>> Bactrim 4/4 >>> HAART  SUBJECTIVE: weaning sedation, became agitated somewhat overnight, otherwise no acute events   VITAL SIGNS: Temp:  [97.2 F (36.2 C)-103.3 F (39.6 C)] 103.3 F (39.6 C) (04/26 0400) Pulse Rate:  [73-133] 122 (04/26 0700) Resp:  [11-23] 20 (04/26 0700) BP: (124-163)/(79-107) 154/88 mmHg (04/26 0700) SpO2:  [89 %-100 %] 92 % (04/26 0700) FiO2 (%):  [30 %-40 %] 40 % (04/26 0357) Weight:  [65.3 kg (143 lb 15.4 oz)] 65.3 kg (143 lb 15.4 oz) (04/26 0400)  VENTILATOR SETTINGS: Vent Mode:  [-] PCV FiO2 (%):  [30 %-40 %] 40 % Set Rate:  [18 bmp] 18 bmp PEEP:  [5 cmH20] 5 cmH20 Plateau Pressure:  [18 cmH20-24 cmH20] 24 cmH20  INTAKE / OUTPUT: Intake/Output     04/25 0701 - 04/26 0700 04/26  0701 - 04/27 0700   I.V. (mL/kg) 345.5 (5.3)    Other 650    NG/GT 1052.5    IV Piggyback 400    Total Intake(mL/kg) 2448 (37.5)    Urine (mL/kg/hr) 2345 (1.5)    Stool 150 (0.1)    Total Output 2495     Net -47          Stool Occurrence 1 x     PHYSICAL EXAMINATION: Gen: sedated heavily on vent, emaciated HEENT: Trach site c/d/i, NG in place PULM: rhonchi bilat CV: Tachy, regular, no murmur AB: BS+, soft Ext: warm no edema Neuro: sedated, reaches spontaneously to touch  LABS: CBC  Recent Labs Lab 10/20/13 0535 10/21/13 0430 10/22/13 0130  WBC 10.6* 11.5* 6.2  HGB 9.4* 9.5* 8.2*  HCT 29.6* 30.2* 28.2*  PLT 247 290 245   Coag's  Recent Labs Lab 10/17/13 0620  INR 1.13   BMET  Recent Labs Lab 10/20/13 0535 10/21/13 0430 10/22/13 0130  NA 141 142 149*  K 3.8 3.4* 3.8  CL 90* 93* 104  CO2 41* 37* 37*  BUN 37* 64* 69*  CREATININE 0.59 1.01 1.11  GLUCOSE 186* 226* 228*   Electrolytes  Recent Labs Lab 10/20/13 0535 10/21/13 0430 10/22/13 0130  CALCIUM 8.1* 8.0* 8.5  MG 2.4 2.9* 2.9*  PHOS 4.2 5.0* 5.2*  Sepsis Markers No results found for this basename: LATICACIDVEN, PROCALCITON, O2SATVEN,  in the last 168 hours ABG  Recent Labs Lab 10/19/13 0436 10/20/13 0435 10/21/13 0415  PHART 7.408 7.384 7.386  PCO2ART 63.7* 76.1* 67.6*  PO2ART 76.2* 104.0* 95.1   Liver Enzymes  Recent Labs Lab 10/18/13 0530  AST 35  ALT 34  ALKPHOS 101  BILITOT <0.2*  ALBUMIN 1.2*   Cardiac Enzymes  Recent Labs Lab 10/22/13 0130 10/22/13 0750 10/22/13 1515  TROPONINI <0.30 <0.30 <0.30   Glucose  Recent Labs Lab 10/22/13 0736 10/22/13 1217 10/22/13 1555 10/22/13 2039 10/23/13 0019 10/23/13 0359  GLUCAP 214* 167* 191* 185* 225* 151*   IMAGING:   ASSESSMENT / PLAN:  PULMONARY A:  Acute respiratory failure with ARDS 2nd to Parainfluenza PNA and presumed PCP in setting of HIV Pneumomediastinum, resolved R pneumothorax, s/p chest tube  placement 4/24 Tracheostomy status 4/24 P:   Continue full vent support, wean as tolerated CXR now VAP bundle Start daily SBT after sedation decreased Solu-Medrol 40 q12h (PCP?) > could likely wean to off Chest tube to suction Albuterol PRN  CARDIOVASCULAR A:  Sinus tach with fever  ST-T changes noted 4/24 but troponin negative x 2 Hypertension P:  Goal MAP>65 APAP prn Restart metoprolol  RENAL A:   Hypernatremia Contraction alkalosis  P:   Hold further diuresis Free water 200 q8h Check BMET now  GASTROINTESTINAL A:   Protein calorie malnutrition GI Px P:   Continue TF Protonix  HEMATOLOGIC A:   Anemia of critical illness VTE Px P:  Trend CBC Lovenox  INFECTIOUS A:   PNA in setting of HIV >> viral pneumonitis (+ parainfluenza 2) +/-bacterial, PCP.  Stage II buttock ulcers.  CMV viremia.  His parainfluenza 2 was positive >> clinically Significant with ARDS.  Fungemia ? (has been receiving antifungal therapy > 1 week)  P:   ID input appreciated  HAART Azithromycin  / Bactrim Px Ganciclovir Diflucan   ENDOCRINE A:   Steroid induced hyperglycemia P:   SSI Add Lantus 10 Decrease steroids this week  NEUROLOGIC A:    Acute encephalopathy P:   Goal RASS 0 to -2 Dilaudid titrate down as tolerated Versed titrated down as tolerated Seroquel May meed Precedex  I have personally obtained history, examined patient, evaluated and interpreted laboratory and imaging results, reviewed medical records, formulated assessment / plan and placed orders.  CRITICAL CARE:  The patient is critically ill with multiple organ systems failure and requires high complexity decision making for assessment and support, frequent evaluation and titration of therapies, application of advanced monitoring technologies and extensive interpretation of multiple databases. Critical Care Time devoted to patient care services described in this note is 35 minutes.   Heber CarolinaBrent McQuaid,  MD [facility] Pager: [phone] Cell: [phone] If no response, call [phone]   10/23/2013, 8:03 AM

## 2013-10-24 ENCOUNTER — Inpatient Hospital Stay (HOSPITAL_COMMUNITY): Payer: BC Managed Care – PPO

## 2013-10-24 DIAGNOSIS — J939 Pneumothorax, unspecified: Secondary | ICD-10-CM

## 2013-10-24 DIAGNOSIS — Z9911 Dependence on respirator [ventilator] status: Secondary | ICD-10-CM

## 2013-10-24 LAB — GLUCOSE, CAPILLARY
GLUCOSE-CAPILLARY: 153 mg/dL — AB (ref 70–99)
GLUCOSE-CAPILLARY: 155 mg/dL — AB (ref 70–99)
GLUCOSE-CAPILLARY: 159 mg/dL — AB (ref 70–99)
GLUCOSE-CAPILLARY: 165 mg/dL — AB (ref 70–99)
GLUCOSE-CAPILLARY: 194 mg/dL — AB (ref 70–99)

## 2013-10-24 LAB — URINE CULTURE
Colony Count: NO GROWTH
Culture: NO GROWTH

## 2013-10-24 LAB — BASIC METABOLIC PANEL
BUN: 39 mg/dL — AB (ref 6–23)
CALCIUM: 8.2 mg/dL — AB (ref 8.4–10.5)
CHLORIDE: 108 meq/L (ref 96–112)
CO2: 33 mEq/L — ABNORMAL HIGH (ref 19–32)
CREATININE: 0.51 mg/dL (ref 0.50–1.35)
GFR calc non Af Amer: >90 mL/min (ref >90)
Glucose, Bld: 179 mg/dL — ABNORMAL HIGH (ref 70–99)
Potassium: 4.1 mEq/L (ref 3.7–5.3)
Sodium: 150 mEq/L — ABNORMAL HIGH (ref 137–147)

## 2013-10-24 LAB — CBC WITH DIFFERENTIAL/PLATELET
BASOS ABS: 0 10*3/uL (ref 0.0–0.1)
BASOS PCT: 0 % (ref 0–1)
EOS PCT: 0 % (ref 0–5)
Eosinophils Absolute: 0 10*3/uL (ref 0.0–0.7)
HEMATOCRIT: 25.1 % — AB (ref 39.0–52.0)
Hemoglobin: 7.6 g/dL — ABNORMAL LOW (ref 13.0–17.0)
Lymphocytes Relative: 4 % — ABNORMAL LOW (ref 12–46)
Lymphs Abs: 0.3 10*3/uL — ABNORMAL LOW (ref 0.7–4.0)
MCH: 29.7 pg (ref 26.0–34.0)
MCHC: 30.3 g/dL (ref 30.0–36.0)
MCV: 98 fL (ref 78.0–100.0)
MONO ABS: 0.2 10*3/uL (ref 0.1–1.0)
Monocytes Relative: 3 % (ref 3–12)
Neutro Abs: 6.3 10*3/uL (ref 1.7–7.7)
Neutrophils Relative %: 93 % — ABNORMAL HIGH (ref 43–77)
PLATELETS: 229 10*3/uL (ref 150–400)
RBC: 2.56 MIL/uL — ABNORMAL LOW (ref 4.22–5.81)
RDW: 17.1 % — AB (ref 11.5–15.5)
WBC: 6.8 10*3/uL (ref 4.0–10.5)

## 2013-10-24 MED ORDER — AZITHROMYCIN 600 MG PO TABS
1200.0000 mg | ORAL_TABLET | ORAL | Status: DC
  Administered 2013-10-27 – 2013-11-03 (×2): 1200 mg
  Filled 2013-10-24 (×2): qty 2

## 2013-10-24 MED ORDER — METHYLPREDNISOLONE SODIUM SUCC 40 MG IJ SOLR
40.0000 mg | Freq: Every day | INTRAMUSCULAR | Status: DC
  Administered 2013-10-25: 40 mg via INTRAVENOUS
  Filled 2013-10-24: qty 1

## 2013-10-24 MED ORDER — EMTRICITABINE-TENOFOVIR DF 200-300 MG PO TABS
1.0000 | ORAL_TABLET | Freq: Every day | ORAL | Status: DC
  Administered 2013-10-25 – 2013-11-05 (×12): 1
  Filled 2013-10-24 (×14): qty 1

## 2013-10-24 MED ORDER — FENTANYL 100 MCG/HR TD PT72
100.0000 ug | MEDICATED_PATCH | TRANSDERMAL | Status: DC
  Administered 2013-10-24: 100 ug via TRANSDERMAL
  Filled 2013-10-24: qty 1

## 2013-10-24 MED ORDER — METOPROLOL TARTRATE 25 MG/10 ML ORAL SUSPENSION
25.0000 mg | Freq: Two times a day (BID) | ORAL | Status: DC
  Administered 2013-10-24 – 2013-10-30 (×14): 25 mg
  Filled 2013-10-24 (×16): qty 10

## 2013-10-24 MED ORDER — DEXTROSE 5 % IV SOLN
INTRAVENOUS | Status: DC
  Administered 2013-10-24 – 2013-10-26 (×3): via INTRAVENOUS

## 2013-10-24 MED ORDER — LORAZEPAM 2 MG/ML IJ SOLN
0.5000 mg | INTRAMUSCULAR | Status: DC | PRN
  Administered 2013-10-24 – 2013-10-25 (×7): 1 mg via INTRAVENOUS
  Filled 2013-10-24 (×7): qty 1

## 2013-10-24 MED ORDER — HYDROMORPHONE HCL PF 1 MG/ML IJ SOLN
0.5000 mg | INTRAMUSCULAR | Status: DC | PRN
  Administered 2013-10-25 (×7): 1 mg via INTRAVENOUS
  Filled 2013-10-24 (×7): qty 1

## 2013-10-24 NOTE — Progress Notes (Signed)
PULMONARY / CRITICAL CARE MEDICINE   Name: William Bender MRN: [id number] DOB: 08-Sep-1965    ADMISSION DATE:  10/03/2013 CONSULTATION DATE:  10/21/2013  REFERRING MD :  EDP  CHIEF COMPLAINT:  Shortness of breath, fever, cough  BRIEF PATIENT DESCRIPTION:  48 yo with recently diagnosis of HIV/AIDS (CD4 = 30 on 3/31) admitted 4/4 with acute hypoxemic respiratory failure and bilateral pneumonia. Of note PCP negative preadmission on Bactrim Px.  SIGNIFICANT / STUDIES EVENTS: 4/04 Chest CTA:  No embolism, bilateral airspace disease 4/09 Chest CT: Pneumomediastinum, small R pneumothorax, bilateral airspace disease 4/15 RUE Venous Doppler: no DVT 4/18 Echocardiogram: EF 60%, no RWMA 4/24 Spontaneous R PTX > chest tube placed 4/24 Trach tube placed 4/26 BLE venous Dopplers: No DVT noted 4/26 BUE venous Dopplers: No DVT noted  LINES / TUBES: ETT 4/5 >> 4/24 IJ CVL 4/5 >> 4/17 LUE PICC 4/17 >>  Trach tube Marland KitchenJY) 4/24 >>  R chest tube 4/24 >>   MICRO: HIV 1 RNA Quant  3/17: 697,948  3/31: 13,616    4/11: 756  4/28:   AFB  BAL 4/05 >> smear neg >> NGTD Fungal BAL 4/05 >> smear neg >> NGTD Blood 4/04 >> NEG CMV BAL 4/05 >> NEG Bact BAL 4/05 >> NEG Pneumocystis DFA 4/05 >> NEG Blood 4/11 >> NEG AFB blood cx 4/11 >> NGTD RVP 4/11 >> PARAINFLUENZA 2 Blast Abs 4/13 >> NEG Histo Abs 4/13 >> NEG Fungal BAL 4/13 >> CANDIDA ALBICANS Bact BAL 4/13 >> NEG M TB complex PCR, BAL 4/13 >> NEG CMV DNA probe (serum) 4/13 >> POSITIVE (101,862) Blood 4/16 >> 1/2 CANDIDA ALBICANS Resp 4/16 >> NEG Urine 4/16  >> NEG Resp 4/20 >> NEG Blood 4/20 >> NEG Resp 422 >> NEG Blood 4/22 >>  C diff 4/23 >> NEG Urine 4/26 >>  Resp 4/26 >>  Blood 4/26 >>    ANTIBIOTICS: (all abx per ID) HAART Vanc 4/04 >> off Cefepime 4/04 >> off Micafungin 4/17 >> 4/24 Gancyclovir 4/6 >>  Fluconazole 4/24 >>  Azithromycin 4/19 >>  TMP-SMX 4/04 >>    SUBJECTIVE: weaning sedation, now off versed.   VITAL  SIGNS: Temp:  [98.6 F (37 C)-101.7 F (38.7 C)] 98.6 F (37 C) (04/27 0800) Pulse Rate:  [74-114] 91 (04/27 0900) Resp:  [15-41] 20 (04/27 0900) BP: (86-144)/(42-99) 97/55 mmHg (04/27 0900) SpO2:  [90 %-99 %] 91 % (04/27 0900) FiO2 (%):  [40 %] 40 % (04/27 0800) Weight:  [63.8 kg (140 lb 10.5 oz)] 63.8 kg (140 lb 10.5 oz) (04/27 0500)  VENTILATOR SETTINGS: Vent Mode:  [-] PCV FiO2 (%):  [40 %] 40 % Set Rate:  [18 bmp] 18 bmp PEEP:  [5 cmH20] 5 cmH20 Plateau Pressure:  [19 cmH20-28 cmH20] 25 cmH20  INTAKE / OUTPUT: Intake/Output     04/26 0701 - 04/27 0700 04/27 0701 - 04/28 0700   I.V. (mL/kg) 860.3 (13.5) 99.6 (1.6)   Other 710 120   NG/GT 1610 100   IV Piggyback 400 100   Total Intake(mL/kg) 3580.3 (56.1) 419.6 (6.6)   Urine (mL/kg/hr) 2270 (1.5)    Stool 250 (0.2)    Total Output 2520     Net +1060.3 +419.6         PHYSICAL EXAMINATION: Gen: sedated  on vent, emaciated HEENT: Trach site clean, NG in place PULM: Mildly labored, scattered rhonchi, no air leak CV: Tachy, regular, no murmur AB: BS+, soft Ext: warm no edema Neuro: sedated, no focal  deficits, not F/C  LABS: I have reviewed all of today's lab results. Relevant abnormalities are discussed in the A/P section  CXR: [place] ARDS pattern and small R apical PTX  ASSESSMENT / PLAN:  PULMONARY A:  Prolonged VDRF Non-resolving ARDS Parainfluenza virus PNA  Pneumomediastinum, resolved Spontaneous R pneumothorax Tracheostomy status  Vent dyssynchrony - improved in PSV mode P:   Wean in PSV mode as long as tolerated  Cont vent bundle Change chest tube to water seal Daily CXR as long as chest tube in place  CARDIOVASCULAR A:  Intermittent sinus tachycardia, reactive (fever, pain, etc) Abnormal EKG 4/24 with negative biomarkers Hypertension, controlled P:  Cont to monitor BP and rhythm  RENAL A:   Hypernatremia Met alkalosis, mild P:   Monitor BMET intermittently Monitor I/Os Correct  electrolytes as indicated  GASTROINTESTINAL A:   Protein calorie malnutrition P:   SUP: Pantoprazole per tube Continue TFs  HEMATOLOGIC A:   Anemia of critical illness without evidence of acute blood loss P:  DVT px: LMWH Monitor CBC intermittently Transfuse per usual ICU guidelines  INFECTIOUS A:   HIV/AIDS Parainfluenza virus pneumonitis Candidemia CMV viremia P:   ID input appreciated  HAART Azithromycin  / Bactrim Px Ganciclovir Diflucan   ENDOCRINE A:   Steroid induced hyperglycemia P:   Cont SSI Cont Lantus Cont to wean steroids to off rapidly  NEUROLOGIC A:    Acute encephalopathy ICU associated discomfort P:   Goal RASS 0 to -1 Cont dexmedetomidine Begin Fentanyl patch 4/27 Wean hydromorphone to off 4/27 DC midaz infusion 4/27 PRN lorazepam and hydromorphone started 4/27   [facility] ACNP [facility] Pager [phone] till 3 pm If no answer page [phone] 10/24/2013, 9:44 AM   PCCM ATTENDING: I have interviewed and examined the patient and reviewed the database. I have formulated the assessment and plan as reflected in the note above with amendments made by me. 45 mins of direct critical care time provided  Father updated in detail @ bedside Will meet with wife, mother and father 4/29 AM Need to clarify end points  Merton Border, MD;  PCCM service; [place] [phone]

## 2013-10-24 NOTE — Progress Notes (Signed)
NUTRITION FOLLOW UP  Intervention:   - Continue TF of Oxepa at 84m/hr with Prostat 31mBID. This will provide 2000 calories, 105g protein, 94253mree water and meets 95% estimated calorie needs and 100% estimated protein needs. Getting 200m37mee water flushes 6 times/day for a total of 2142ml22mid.  - Will continue to monitor   Nutrition Dx:   Inadequate oral intake related to inability to eat as evidenced by NPO - ongoing   Goal:   TF regimen to meet >/= 90% of their estimated nutrition needs - met  Monitor:   Weights, labs, TF tolerance, vent status   Assessment:   47 y/51male recently diagnosed with HIV and treated empirically for PCP [facility] with acute hypoxemic respiratory failure.   4/5 - Pt intubated 4/5. Per family, pt has lost ~15-20 lbs over the past few weeks. He is using nutritional supplements at home (Boost or Ensure.). Received consult for TF management, RD ordered Vital AF 1.2 @ 20 ml/hr via OG tube and increase by 10 ml every 4 hours to goal rate of 80 ml/hr. At goal rate, tube feeding regimen will provide 2304 kcal, 103 grams of protein, and 1549 ml of H2O. Goal rate meets 100% of estimated calorie and protein needs.   4/7 - Pt started on ARDS protocol 4/6. Per RN, pt tolerating Vital AF 1.2 at goal rate of 80ml/44mith 30ml o62msiduals this morning and 30-40ml re16mals overnight. Remains intubated.   4/10 - Patient remains intubated.  Receiving Oxepa at 20 ml/hr plus prostat and tolerating well. Propofol: 27.5 ml/hr provides 726 calories continues  4/13 - Remains intubated. Receiving Oxepa at 30ml/hr 57m Prosat 30ml QID 10movides 1480 calories, 105g protein. TF plus current calories from Propofol provide 2327 calories meeting 124% estimated calorie needs and 100% estimated protein needs. Paralytics added 4/10. Needed ET tube changed today which was performed after emergency bronchoscopy. Per RN, pt tolerating TF well without any residuals.    4/17 - Remains intubated. Receiving Oxepa to 20 ml/hr as pt getting high rate of Propfol. Oxepa at 20ml/hr wi36mrostat 30ml QID wi13malories from current Propfol rate will provide 1967 calories, 90g protein, and 377ml free wa40mand meet 105% estimated calorie needs and 86% estimated protein needs. Per RN, pt tolerating TF with no residuals.   4/20 - Remains intubated. Receiving Oxepa to 20 ml/hr as pt getting high rate of Propfol. Oxepa at 20ml/hr with 69mtat 30ml QID with 14mries from current Propfol rate will provide 1967 calories, 90g protein, and 377ml free water74m meet 105% of previously estimated calorie needs and 86% estimated protein needs. Per conversation with RN, only 10ml of TF resid52m this morning. D/C Prostat and Oxepa. Start TF via OGT of Vital High Protein at 20ml/hr increase 57m0ml every 4 hours60mgoal of 60ml/hr. This will 6mide 1440 calories, 126g protein, 1203 ml water. Goal rate plus calories from Propofol will provide 2348 calories and meet 101% estimated calorie needs and 101% estimated protein needs. If IVF d/c, recommend 200ml water flushes 660mes/day. Changed TF rate as pt getting so little of Oxepa for TF to be beneficial.   4/21 - Remains intubated. Receiving Vital High Protein at 50ml/hr which provide34m00 calories, 105g protein, and 1003ml free water which 53ms 52% of previously estimated calorie needs and 100% estimated protein needs. Continues to have severe ARDS per RN. Propofol d/c today due to pancreatic damage per conversation with  RN. RN reports pt tolerating TF well without any residuals. Will change TF back to Oxepa for pt to get specialized nutrition now that pt no longer on Propofol. Ordered Oxepa at 59m/hr increase by 193mevery 4 hours to goal of 6079mr with Prostat 12m62mily  4/23 - Remains intubated. Per RN, pt tolerating TF well without any residuals, at goal rate. RN states they are awaiting family decision on terminal wean.   4/27 -  Extubated 4/24 then had tracheostomy placed. Chest tube placed 4/24 for pneumothorax. On full vent support but weaning trial in progress. Per RN, pt with 70-100ml9mTF residual today. Per conversation with RN, plan is for goals of care meeting on Wednesday.   TF: Oxepa at 50ml/17mith Prostat 12ml B67m provides 2000 calories, 105g protein, 942ml fr70mater and meets 95% estimated calorie needs, 100% estimated protein needs  Sodium elevated, free water flushes doubled yesterday   Patient is currently intubated on ventilator support MV: 12.1 L/min Temp (24hrs), Avg:99.5 F (37.5 C), Min:98.6 F (37 C), Max:101.1 F (38.4 C)  Propofol: off     Height: Ht Readings from Last 1 Encounters:  10/21/13 5' 10"  (1.778 m)    Weight Status:   Wt Readings from Last 1 Encounters:  10/24/13 140 lb 10.5 oz (63.8 kg)  Admit wt:        155 lb (70.3 kg) Net I/Os: +2.7L  Re-estimated needs:  Kcal: 2100 Protein: 105-125g Fluid: per MD  Skin: +1 Generalized edema, non-pitting RUE, LUE, LLE, RLE edema, stage 2 sacral pressure ulcer on buttocks    Diet Order: NPO   Intake/Output Summary (Last 24 hours) at 10/24/13 0953 Last data filed at 10/24/13 0900  Gross per 24 hour  Intake 3731.86 ml  Output   2320 ml  Net 1411.86 ml    Last BM: 4/27   Labs:   Recent Labs Lab 10/20/13 0535 10/21/13 0430 10/22/13 0130 10/23/13 0850 10/24/13 0500  NA 141 142 149* 151* 150*  K 3.8 3.4* 3.8 3.4* 4.1  CL 90* 93* 104 106 108  CO2 41* 37* 37* 34* 33*  BUN 37* 64* 69* 60* 39*  CREATININE 0.59 1.01 1.11 0.79 0.51  CALCIUM 8.1* 8.0* 8.5 8.6 8.2*  MG 2.4 2.9* 2.9*  --   --   PHOS 4.2 5.0* 5.2*  --   --   GLUCOSE 186* 226* 228* 205* 179*    CBG (last 3)   Recent Labs  10/24/13 0006 10/24/13 0420 10/24/13 0737  GLUCAP 153* 159* 165*    Scheduled Meds: . antiseptic oral rinse  15 mL Mouth Rinse QID  . azithromycin  1,200 mg Oral Weekly  . chlorhexidine  15 mL Mouth Rinse BID  .  collagenase   Topical Daily  . docusate  100 mg Oral BID  . dolutegravir  50 mg Oral Daily  . emtricitabine-tenofovir  1 tablet Oral Daily  . enoxaparin (LOVENOX) injection  40 mg Subcutaneous Daily  . etomidate  40 mg Intravenous Once  . feeding supplement (PRO-STAT SUGAR FREE 64)  30 mL Per Tube BID  . fluconazole (DIFLUCAN) IV  400 mg Intravenous Q24H  . free water  200 mL Per Tube Q4H  . ganciclovir (CYTOVENE) IV  5 mg/kg Intravenous Q12H  . insulin aspart  0-15 Units Subcutaneous 6 times per day  . insulin glargine  10 Units Subcutaneous QHS  . methylPREDNISolone (SOLU-MEDROL) injection  40 mg Intravenous Q12H  . metoprolol  5 mg  Intravenous 4 times per day  . multivitamin  5 mL Per Tube Daily  . pantoprazole sodium  40 mg Per Tube Daily  . QUEtiapine  50 mg Per Tube BID  . sodium chloride  10-40 mL Intracatheter Q12H  . sulfamethoxazole-trimethoprim  1 tablet Per Tube Daily    Continuous Infusions: . dexmedetomidine 0.9 mcg/kg/hr (10/24/13 0900)  . dextrose    . feeding supplement (OXEPA) 1,000 mL (10/23/13 1157)  . HYDROmorphone 2 mg/hr (10/24/13 0908)  . midazolam (VERSED) 1 mg/ml in sodium chloride 0.9 % 100 mL infusion Stopped (10/24/13 0800)     Mikey College MS, [place], [place] Pager [phone] After Hours Pager

## 2013-10-24 NOTE — Progress Notes (Signed)
ANTIBIOTIC CONSULT NOTE - FOLLOW UP  Pharmacy Consult for Fluconazole Indication: Candidemia  Allergies  Allergen Reactions  . Penicillins Rash    Patient Measurements: Height: 5\' 10"  (177.8 cm) Weight: 140 lb 10.5 oz (63.8 kg) IBW/kg (Calculated) : 73 Adjusted Body Weight:   Vital Signs: Temp: 98.6 F (37 C) (04/27 0800) Temp src: Axillary (04/27 0800) BP: 114/52 mmHg (04/27 1000) Pulse Rate: 88 (04/27 1000) Intake/Output from previous day: 04/26 0701 - 04/27 0700 In: 3580.3 [I.V.:860.3; NG/GT:1610; IV Piggyback:400] Out: 2520 [Urine:2270; Stool:250] Intake/Output from this shift: Total I/O In: 488.6 [I.V.:118.6; Other:120; NG/GT:150; IV Piggyback:100] Out: -   Labs:  Recent Labs  10/22/13 0130 10/23/13 0850 10/24/13 0500  WBC 6.2  --  6.8  HGB 8.2*  --  7.6*  PLT 245  --  229  CREATININE 1.11 0.79 0.51   Estimated Creatinine Clearance: 103 ml/min (by C-G formula based on Cr of 0.51).  Recent Labs  10/21/13 1130 10/22/13 0130  VANCOTROUGH 48.4*  --   VANCORANDOM  --  22.2      Assessment: 89 yoM admitted 4/4 with tachycardia and SOB, concerning for ongoing/recurrent PCP pneumonia. PMH includes recent hospitalization (3/17- 09/20/13) with newly diagnosed HIV, started on ART (Stribild), and treatment for PCP pneumonia. At discharge, he was taking Bactrim, but prematurely decreased his dosage. Pharmacy initially consulted to dose Vancomycin, Cefepime, and Bactrim. ID consult recommended repeating an entire new course for PCP PNA. 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 Since then, antiinfectives narrowed to bactrim and ganciclovir, with micafungin added for fungemia.  On 4/22 vancomycin and cefepime added as patient febrile 4/21 and continues to have diffuse BL pulmonary infiltrates and increased O2 requirements. Vanc, Cefepime and Micafungin were discontinued and Fluconzole IV started on 4/24 for fungemia.  4/4 >> Vanc >> 4/4, 4/16 >> 18, 4/22  >>4/24 4/4 >> Cefepime >> 4/14, 4/16 >> 18, 4/22 >>4/24 4/4 >> Bactrim IV >> 4/21 4/6 >> ganciclovir >> 4/14 >> Qweek fluconazole 100mg  >> 4/18 PTA >> Qweek azithro (MAC ppx) >> 4/18 >> micafungin >>4/24 4/21>> Septra PO (ppx) >> 4/24 >> fluconazole >>   Tmax: 101.7 WBCs: 6.8 Renal: SCr 0.51 (improved), CrCl = > 18400ml/min(C-G), excellent UOP   CD4: 30 (4/11), 30 (3/31) HIV RNA quant: 756 (4/11), 13K (3/31)  4/4 Histoplasma Ag: neg 4/5 PCP DFA: neg (on Bactrim x2 weeks prior to collection) 4/5 Fungal: ngtd, pending, in progress x4 weeks 4/5 CMV cx: neg 4/7 fungitell (B glucan test) (-) 4/8 CMV DNA plasma: 78k  4/9 cryptococcal Ag (-) 4/11 AFB: ngtd, pending, in progress x6 weeks 4/11 resp virus panel: +parainfluenza 2 4/11 Quantiferon TB: indeterminate 4/13: CMV DNA: > 101,000 (high) 4/13 Fungus w smear (BAL): C.Albicans 4/13 fungal Ab: all neg 4/13 TB PCR: not detected 4/16 Blood: 1/2 candida albicans, 1/2 NGF 4/16 Urine: insig growth 4/20 Urine: NGF 4/20 Trach asp: flora 4/20 Blood x2: ng F 4/22 blood x 2: ng F 4/22 sputum: nl flora 4/23 C. Diff: neg 4/26 Resp culture: 4/26 Urine:  4/26 Blood x 2: ngtd   Drug level / dose changes info:   Goal of Therapy:  Eradication of infection  Plan:   Continue Fluconazole 400mg  IV Q24H  F/u cultures and sensitivities   Pager: [phone]  10/24/2013,10:23 AM

## 2013-10-24 NOTE — Progress Notes (Signed)
Patient ID: William Bender, male   DOB: [birth year], 48 y.o.   MRN: [id number]         [facility]    Date of Admission:  2013-10-27                   Day 22 ganciclovir        Day 10 antifungal therapy Principal Problem:   Pneumonia Active Problems:   Acute respiratory failure   AIDS   PCP (pneumocystis carinii pneumonia)   CMV (cytomegalovirus)   Sacral decubitus ulcer, stage II   Hyponatremia   Pneumomediastinum   Fungemia   Spontaneous pneumothorax   Pneumothorax on right   . antiseptic oral rinse  15 mL Mouth Rinse QID  . [START ON 10/27/2013] azithromycin  1,200 mg Per Tube Weekly  . chlorhexidine  15 mL Mouth Rinse BID  . collagenase   Topical Daily  . dolutegravir  50 mg Oral Daily  . [START ON 10/25/2013] emtricitabine-tenofovir  1 tablet Per Tube Daily  . enoxaparin (LOVENOX) injection  40 mg Subcutaneous Daily  . etomidate  40 mg Intravenous Once  . feeding supplement (PRO-STAT SUGAR FREE 64)  30 mL Per Tube BID  . fentaNYL  100 mcg Transdermal Q72H  . fluconazole (DIFLUCAN) IV  400 mg Intravenous Q24H  . free water  200 mL Per Tube Q4H  . ganciclovir (CYTOVENE) IV  5 mg/kg Intravenous Q12H  . insulin aspart  0-15 Units Subcutaneous 6 times per day  . insulin glargine  10 Units Subcutaneous QHS  . [START ON 10/25/2013] methylPREDNISolone (SOLU-MEDROL) injection  40 mg Intravenous Daily  . metoprolol tartrate  25 mg Per Tube BID  . multivitamin  5 mL Per Tube Daily  . pantoprazole sodium  40 mg Per Tube Daily  . QUEtiapine  50 mg Per Tube BID  . sodium chloride  10-40 mL Intracatheter Q12H  . sulfamethoxazole-trimethoprim  1 tablet Per Tube Daily    Objective: Temp:  [98.1 F (36.7 C)-99.8 F (37.7 C)] 98.1 F (36.7 C) (04/27 1600) Pulse Rate:  [74-110] 89 (04/27 1600) Resp:  [18-41] 22 (04/27 1600) BP: (86-132)/(42-99) 124/73 mmHg (04/27 1600) SpO2:  [90 %-99 %] 97 % (04/27 1600) FiO2 (%):  [40 %-50 %] 50 % (04/27 1511) Weight:   [140 lb 10.5 oz (63.8 kg)] 140 lb 10.5 oz (63.8 kg) (04/27 0500)   General: He is more alert. He is reaching for his NG tube Lungs: Clear anteriorly Cor: Regular S1-S2 no murmurs Abdomen: Soft.   Lab Results Lab Results  Component Value Date   WBC 6.8 10/24/2013   HGB 7.6* 10/24/2013   HCT 25.1* 10/24/2013   MCV 98.0 10/24/2013   PLT 229 10/24/2013    Lab Results  Component Value Date   CREATININE 0.51 10/24/2013   BUN 39* 10/24/2013   NA 150* 10/24/2013   K 4.1 10/24/2013   CL 108 10/24/2013   CO2 33* 10/24/2013    Lab Results  Component Value Date   ALT 34 10/18/2013   AST 35 10/18/2013   ALKPHOS 101 10/18/2013   BILITOT <0.2* 10/18/2013    HIV 1 RNA Quant (copies/mL)  Date Value  10/08/2013 756*  09/27/2013 13616*  09/13/2013 409811*     CD4 T Cell Abs (/uL)  Date Value  10/08/2013 30*  09/27/2013 30*  09/13/2013 60*    Microbiology: Recent Results (from the past 240 hour(s))  URINE CULTURE     Status: None  Collection Time    10/17/13  4:59 PM      Result Value Ref Range Status   Specimen Description URINE, CATHETERIZED   Final   Special Requests Normal   Final   Culture  Setup Time     Final   Value: 10/18/2013 02:13     [facility]formed [facility]   Colony Count     Final   Value: NO GROWTH     [facility]   Culture     Final   Value: NO GROWTH     [facility]formed at [facility]   Report Status 10/18/2013 FINAL   Final  CULTURE, BLOOD (ROUTINE X 2)     Status: None   Collection Time    10/17/13  5:00 PM      Result Value Ref Range Status   Specimen Description BLOOD RIGHT RIGHT ANTECUBITAL   Final   Special Requests BOTTLES DRAWN AEROBIC AND ANAEROBIC 10CC   Final   Culture  Setup Time     Final   Value: 10/17/2013 18:56     [facility]formed at [facility]   Culture     Final   Value: NO GROWTH 5 DAYS     [facility]formed at [facility]   Report Status 10/23/2013 FINAL   Final  CULTURE, RESPIRATORY (NON-EXPECTORATED)      Status: None   Collection Time    10/17/13  5:00 PM      Result Value Ref Range Status   Specimen Description TRACHEAL ASPIRATE   Final   Special Requests NONE   Final   Gram Stain     Final   Value: MODERATE WBC PRESENT, PREDOMINANTLY PMN     NO SQUAMOUS EPITHELIAL CELLS SEEN     NO ORGANISMS SEEN     Performed at [facility]   Culture     Final   Value: Non-Pathogenic Oropharyngeal-type Flora Isolated.     Performed at [facility]   Report Status 10/19/2013 FINAL   Final  CULTURE, BLOOD (ROUTINE X 2)     Status: None   Collection Time    10/17/13  5:05 PM      Result Value Ref Range Status   Specimen Description BLOOD RIGHT ARM   Final   Special Requests BOTTLES DRAWN AEROBIC AND ANAEROBIC 10CC   Final   Culture  Setup Time     Final   Value: 10/17/2013 18:56     Performed at [facility]   Culture     Final   Value: NO GROWTH 5 DAYS     Performed at [facility]   Report Status 10/23/2013 FINAL   Final  CULTURE, RESPIRATORY (NON-EXPECTORATED)     Status: None   Collection Time    10/19/13  5:53 PM      Result Value Ref Range Status   Specimen Description TRACHEAL ASPIRATE   Final   Special Requests NONE   Final   Gram Stain     Final   Value: ABUNDANT WBC PRESENT,BOTH PMN AND MONONUCLEAR     NO SQUAMOUS EPITHELIAL CELLS SEEN     NO ORGANISMS SEEN     Performed at [facility]   Culture     Final   Value: Non-Pathogenic Oropharyngeal-type Flora Isolated.     Performed at [facility]   Report Status 10/22/2013 FINAL   Final  CULTURE, BLOOD (ROUTINE X 2)  Status: None   Collection Time    10/19/13  5:55 PM      Result Value Ref Range Status   Specimen Description BLOOD RIGHT ARM   Final   Special Requests BOTTLES DRAWN AEROBIC AND ANAEROBIC 8CC   Final   Culture  Setup Time     Final   Value: 10/19/2013 21:55     Performed at [facility]   Culture     Final   Value:        BLOOD CULTURE RECEIVED NO  GROWTH TO DATE CULTURE WILL BE HELD FOR 5 DAYS BEFORE ISSUING A FINAL NEGATIVE REPORT     Performed at [facility]   Report Status PENDING   Incomplete  CULTURE, BLOOD (ROUTINE X 2)     Status: None   Collection Time    10/19/13  6:00 PM      Result Value Ref Range Status   Specimen Description BLOOD RIGHT ARM   Final   Special Requests BOTTLES DRAWN AEROBIC ONLY 2CC   Final   Culture  Setup Time     Final   Value: 10/19/2013 21:55     Performed at [facility]   Culture     Final   Value:        BLOOD CULTURE RECEIVED NO GROWTH TO DATE CULTURE WILL BE HELD FOR 5 DAYS BEFORE ISSUING A FINAL NEGATIVE REPORT     Performed at [facility]   Report Status PENDING   Incomplete  CLOSTRIDIUM DIFFICILE BY PCR     Status: None   Collection Time    10/20/13  6:10 AM      Result Value Ref Range Status   C difficile by pcr NEGATIVE  NEGATIVE Final   Comment: Performed at [facility]  CULTURE, RESPIRATORY (NON-EXPECTORATED)     Status: None   Collection Time    10/23/13 11:40 AM      Result Value Ref Range Status   Specimen Description TRACHEAL ASPIRATE   Final   Special Requests Immunocompromised   Final   Gram Stain     Final   Value: RARE WBC PRESENT,BOTH PMN AND MONONUCLEAR     RARE SQUAMOUS EPITHELIAL CELLS PRESENT     MODERATE GRAM POSITIVE COCCI     IN PAIRS FEW GRAM NEGATIVE RODS     RARE GRAM POSITIVE RODS   Culture     Final   Value: Culture reincubated for better growth     Performed at [facility]   Report Status PENDING   Incomplete  CULTURE, BLOOD (ROUTINE X 2)     Status: None   Collection Time    10/23/13 11:55 AM      Result Value Ref Range Status   Specimen Description BLOOD RIGHT ARM   Final   Special Requests     Final   Value: BOTTLES DRAWN AEROBIC AND ANAEROBIC 4CC BOTH BOTTLES   Culture  Setup Time     Final   Value: 10/23/2013 18:28     Performed at [facility]   Culture     Final   Value:        BLOOD  CULTURE RECEIVED NO GROWTH TO DATE CULTURE WILL BE HELD FOR 5 DAYS BEFORE ISSUING A FINAL NEGATIVE REPORT     Performed at [facility]   Report Status PENDING   Incomplete    Studies/Results: Dg Chest Port 1 View  10/24/2013  CLINICAL DATA:  ARDS, pneumothorax, RIGHT chest tube, tracheostomy, history hypertension, HIV  EXAM: PORTABLE CHEST - 1 VIEW  COMPARISON:  Portable exam 0512 hr compared to 10/23/2013  FINDINGS: Endotracheal tube and feeding tube unchanged.  LEFT arm PICC line tip projects over mid SVC.  Stable RIGHT thoracostomy tube.  Mild enlargement of cardiac silhouette.  Stable mediastinal contours.  Diffuse airspace infiltrates consistent with history of ARDS.  No pleural effusion.  Small RIGHT apex pneumothorax again noted.  IMPRESSION: Persistent small RIGHT apex pneumothorax despite thoracostomy tube.  Diffuse BILATERAL pulmonary infiltrates consistent with history of ARDS, grossly unchanged.   Electronically Signed   By: Ulyses SouthwardMark  Boles M.D.   On: 10/24/2013 07:21   Dg Chest Port 1 View  10/23/2013   CLINICAL DATA:  ARDS, pneumothorax, endotracheal tube placement  EXAM: PORTABLE CHEST - 1 VIEW  COMPARISON:  DG CHEST 1V PORT dated 10/22/2013; DG CHEST 1V PORT dated 10/20/2013  FINDINGS: Grossly unchanged cardiac silhouette and mediastinal contours. Interval exchange and placement of a new weighted tip enteric tube with tip terminating over the expected location of the gastric fundus. Otherwise, stable positioning of support apparatus. Interval development of a tiny right apical pneumothorax. Extensive bilateral coarsened reticular heterogeneous opacities are unchanged with relative areas of consolidation within the left upper and right mid lungs. No new focal airspace opacities. No pleural effusion. Unchanged bones.  IMPRESSION: 1. Interval exchange/replacement of enteric tube with weighted tip enteric tube now projecting over the gastric fundus. Otherwise, stable positioning of support  apparatus. 2. Interval development of a tiny right apical pneumothorax. 3. Grossly unchanged extensive bilateral airspace opacities worrisome for multifocal infection (including atypical etiologies) and/or ARDS.   Electronically Signed   By: Simonne ComeJohn  Watts M.D.   On: 10/23/2013 11:45    Assessment: He remains ventilator dependent and is having intermittent fevers but overall has made progress weaning in the past 5-6 days.  Plan: 1. Continue ganciclovir, fluconazole and steroids 2. Continue antiretroviral therapy and prophylaxis for pneumocystis and Mycobacterium avium  Cliffton AstersJohn Amardeep Beckers, MD [facility] [phone] pager   [phone] cell 10/24/2013, 5:30 PM

## 2013-10-24 NOTE — Progress Notes (Signed)
CARE MANAGEMENT NOTE 10/24/2013  Patient:  Moehring,Derin   Account Number:  [id number]  Date Initiated:  10/03/2013  Documentation initiated by:  Demond Shallenberger  Subjective/Objective Assessment:   pt admitted on 10/04/2013/required [id number]/hcap and failed outpt treatment moved to icu on [id number].     Action/Plan:   tbd, will follow and see what needs are present once extubated and awake.   Anticipated DC Date:  10/27/2013   Anticipated DC Plan:  LONG TERM ACUTE CARE (LTAC)  In-house referral  NA      DC Planning Services  NA      PAC Choice  NA   Choice offered to / List presented to:  NA   DME arranged  NA      DME agency  NA     HH arranged  NA      HH agency  NA   Status of service:  In process, will continue to follow Medicare Important Message given?  NA - LOS <3 / Initial given by admissions (If response is "NO", the following Medicare IM given date fields will be blank) Date Medicare IM given:   Date Additional Medicare IM given:    Discharge Disposition:    Per UR Regulation:  Reviewed for med. necessity/level of care/duration of stay  If discussed at Long Length of Stay Meetings, dates discussed:   10/06/2013  10/11/2013  10/13/2013  10/18/2013  10/20/2013    Comments:  [id number]/Roe Koffman Earlene Plateravis, RN, BSN, CCM, [phone] Chart reviewed for update of needs and condition./ Dr.Simons spoke at length with Father of son, family meeting for Wednesday, was trached on [id number] remains vent dependent.   [id number]/Peng Thorstenson Earlene Plateravis, RN, BSN, CCM [phone] Chart Reviewed for discharge and hospital needs. REMAINS ON FULL VENT SUPPORT/ IV SEDATION/ POSS TRACH AND PEG TUBE TODAY OR TOMORROW/FAMILY ATTEMPTING TO MAKE A DECISION CONCERNING FUTURE CONDITION AND CARE-SNF WITH TRACH AND PEG OR TERMINAL WEAN/ Discharge needs at time of review: None present will follow for needs. Review of patient progress due on [id number].   [id number]/Arien Morine Earlene Plateravis, RN, BSN, CCM,  [phone] Chart reviewed for update of needs and condition./ remains on full vent support, and sedated due to agitiation/poss conversion to trach later this week, failed weaning attempts/pt will need ltach palcement once trached and has failed three attempts to wean/  [id number]/Bailei Buist Earlene Plateravis, RN, BSN, [place] [phone] Chart Reviewed for discharge and hospital needs. remains vent dependant/ards,multiple critical medical problems. Discharge needs at time of review: None present will follow for needs. Review of patient progress due on [id number].   [id number]/Suheyb Raucci Earlene PlaterDavis, RN, BSN, [place] [phone] Chart Reviewed for discharge and hospital needs. Discharge needs at time of review: None present will follow for needs. Review of patient progress due on [id number].   [id number]/Jailey Booton Earlene PlaterDavis, RN, BSN, [place] [phone] Chart Reviewed for discharge and hospital needs. Discharge needs at time of review: None present will follow for needs. Review of patient progress due on [id number]. vent day4   [id number]/Truett Mcfarlan Earlene PlaterDavis, RN, BSN, [place] [phone] Chart Reviewed for discharge and hospital needs. Discharge needs at time of review: None present will follow for needs. Review of patient progress due on [id number].

## 2013-10-24 NOTE — Progress Notes (Signed)
10/24/13 1600  Clinical Encounter Type  Visited With Family;Patient and family together (mom Cordelia PenSherry in conference room; prayer at bedside w/ mom & pt)  Visit Type Spiritual support;Social support  Spiritual Encounters  Spiritual Needs Emotional;Prayer  Stress Factors  Family Stress Factors Loss of control;Major life changes;Financial concerns;Exhausted   Continuing to follow pt and family for spiritual and emotional support.  Served as empathic witness and discernment sounding board as mom Cordelia PenSherry processed her impressions of the latest news from pulmonology and infectious disease, including possible implications of the pulmonology meeting scheduled for Wednesday at 11 am, as well as many questions about future trajectories, quality of life, and best sense of William Bender's wishes and values that might guide his care.  Family is aware of chaplain support available before, during, and/or after meeting, as desired.  Please page at any time:  [phone].  Thank you.  [address]Chaplain Isaias Dowson [place], [place] [phone]

## 2013-10-25 ENCOUNTER — Inpatient Hospital Stay (HOSPITAL_COMMUNITY): Payer: BC Managed Care – PPO

## 2013-10-25 DIAGNOSIS — B49 Unspecified mycosis: Secondary | ICD-10-CM

## 2013-10-25 LAB — GLUCOSE, CAPILLARY
GLUCOSE-CAPILLARY: 159 mg/dL — AB (ref 70–99)
GLUCOSE-CAPILLARY: 106 mg/dL — AB (ref 70–99)
GLUCOSE-CAPILLARY: 159 mg/dL — AB (ref 70–99)
Glucose-Capillary: 158 mg/dL — ABNORMAL HIGH (ref 70–99)
Glucose-Capillary: 154 mg/dL — ABNORMAL HIGH (ref 70–99)
Glucose-Capillary: 172 mg/dL — ABNORMAL HIGH (ref 70–99)
Glucose-Capillary: 159 mg/dL — ABNORMAL HIGH (ref 70–99)
Glucose-Capillary: 127 mg/dL — ABNORMAL HIGH (ref 70–99)

## 2013-10-25 LAB — CBC
HEMATOCRIT: 23.1 % — AB (ref 39.0–52.0)
HEMOGLOBIN: 7 g/dL — AB (ref 13.0–17.0)
MCH: 29.9 pg (ref 26.0–34.0)
MCHC: 30.3 g/dL (ref 30.0–36.0)
MCV: 98.7 fL (ref 78.0–100.0)
Platelets: 196 10*3/uL (ref 150–400)
RBC: 2.34 MIL/uL — AB (ref 4.22–5.81)
RDW: 17.1 % — ABNORMAL HIGH (ref 11.5–15.5)
WBC: 4.9 10*3/uL (ref 4.0–10.5)

## 2013-10-25 LAB — BASIC METABOLIC PANEL
BUN: 30 mg/dL — AB (ref 6–23)
CHLORIDE: 106 meq/L (ref 96–112)
CO2: 33 meq/L — AB (ref 19–32)
CREATININE: 0.42 mg/dL — AB (ref 0.50–1.35)
Calcium: 8.2 mg/dL — ABNORMAL LOW (ref 8.4–10.5)
GFR calc Af Amer: >90 mL/min (ref >90)
GFR calc non Af Amer: >90 mL/min (ref >90)
GLUCOSE: 161 mg/dL — AB (ref 70–99)
Potassium: 4 mEq/L (ref 3.7–5.3)
Sodium: 146 mEq/L (ref 137–147)

## 2013-10-25 LAB — CULTURE, BLOOD (ROUTINE X 2)
Culture: NO GROWTH
Culture: NO GROWTH

## 2013-10-25 LAB — CULTURE, RESPIRATORY W GRAM STAIN

## 2013-10-25 LAB — T-HELPER CELLS (CD4) COUNT (NOT AT ARMC)
CD4 % Helper T Cell: 12 % — ABNORMAL LOW (ref 33–55)
CD4 T Cell Abs: 30 /uL — ABNORMAL LOW (ref 400–2700)

## 2013-10-25 LAB — CULTURE, RESPIRATORY

## 2013-10-25 MED ORDER — CLONAZEPAM 0.5 MG PO TBDP
1.0000 mg | ORAL_TABLET | Freq: Two times a day (BID) | ORAL | Status: DC
  Administered 2013-10-25 – 2013-10-26 (×3): 1 mg via ORAL
  Filled 2013-10-25 (×4): qty 2

## 2013-10-25 MED ORDER — DEXMEDETOMIDINE HCL IN NACL 400 MCG/100ML IV SOLN
0.0000 ug/kg/h | INTRAVENOUS | Status: DC
  Administered 2013-10-25 (×3): 1.2 ug/kg/h via INTRAVENOUS
  Administered 2013-10-26: 0.8 ug/kg/h via INTRAVENOUS
  Administered 2013-10-26: 1 ug/kg/h via INTRAVENOUS
  Administered 2013-10-26 – 2013-10-27 (×2): 0.7 ug/kg/h via INTRAVENOUS
  Filled 2013-10-25 (×7): qty 100

## 2013-10-25 MED ORDER — QUETIAPINE FUMARATE 100 MG PO TABS
100.0000 mg | ORAL_TABLET | Freq: Two times a day (BID) | ORAL | Status: DC
  Administered 2013-10-25 – 2013-10-26 (×2): 100 mg
  Filled 2013-10-25 (×4): qty 1

## 2013-10-25 MED ORDER — CLONIDINE HCL 0.1 MG/24HR TD PTWK
0.1000 mg | MEDICATED_PATCH | TRANSDERMAL | Status: DC
  Administered 2013-10-25 – 2013-11-01 (×2): 0.1 mg via TRANSDERMAL
  Filled 2013-10-25 (×2): qty 1

## 2013-10-25 MED ORDER — CLONAZEPAM 0.1 MG/ML ORAL SUSPENSION
1.0000 mg | Freq: Two times a day (BID) | ORAL | Status: DC
  Filled 2013-10-25 (×2): qty 10

## 2013-10-25 MED ORDER — LORAZEPAM 2 MG/ML IJ SOLN
0.5000 mg | INTRAMUSCULAR | Status: DC | PRN
  Administered 2013-10-26 – 2013-11-02 (×11): 1 mg via INTRAVENOUS
  Administered 2013-11-02: 0.5 mg via INTRAVENOUS
  Administered 2013-11-02: 1 mg via INTRAVENOUS
  Filled 2013-10-25 (×14): qty 1

## 2013-10-25 MED ORDER — PREDNISONE 5 MG/ML PO CONC
20.0000 mg | Freq: Every day | ORAL | Status: DC
  Administered 2013-10-26: 20 mg
  Filled 2013-10-25 (×2): qty 4

## 2013-10-25 NOTE — Progress Notes (Signed)
Wasted 75 mL of 100 mL bag of Dilaudid 0.5 mg/mL in sink. Witnessed by Wonda HornerMegan Stocks, RN.

## 2013-10-25 NOTE — Progress Notes (Signed)
Patient ID: William Bender, male   DOB: 04-18-1966, 48 y.o.   MRN: [id number]         [facility]ious Disease    Date of Admission:  10/09/2013           Day 23 ganciclovir        Day 11 antifungal therapy Principal Problem:   Pneumonia Active Problems:   Acute respiratory failure   AIDS   PCP (pneumocystis carinii pneumonia)   CMV (cytomegalovirus)   Sacral decubitus ulcer, stage II   Hyponatremia   Pneumomediastinum   Fungemia   Spontaneous pneumothorax   Pneumothorax on right   . antiseptic oral rinse  15 mL Mouth Rinse QID  . [START ON 10/27/2013] azithromycin  1,200 mg Per Tube Weekly  . chlorhexidine  15 mL Mouth Rinse BID  . clonazepam  1 mg Oral BID  . cloNIDine  0.1 mg Transdermal Weekly  . collagenase   Topical Daily  . dolutegravir  50 mg Oral Daily  . emtricitabine-tenofovir  1 tablet Per Tube Daily  . enoxaparin (LOVENOX) injection  40 mg Subcutaneous Daily  . etomidate  40 mg Intravenous Once  . feeding supplement (PRO-STAT SUGAR FREE 64)  30 mL Per Tube BID  . fentaNYL  100 mcg Transdermal Q72H  . fluconazole (DIFLUCAN) IV  400 mg Intravenous Q24H  . free water  200 mL Per Tube Q4H  . ganciclovir (CYTOVENE) IV  5 mg/kg Intravenous Q12H  . insulin aspart  0-15 Units Subcutaneous 6 times per day  . insulin glargine  10 Units Subcutaneous QHS  . metoprolol tartrate  25 mg Per Tube BID  . multivitamin  5 mL Per Tube Daily  . pantoprazole sodium  40 mg Per Tube Daily  . [START ON 10/26/2013] predniSONE  20 mg Per Tube Q breakfast  . QUEtiapine  100 mg Per Tube BID  . sodium chloride  10-40 mL Intracatheter Q12H  . sulfamethoxazole-trimethoprim  1 tablet Per Tube Daily    Objective: Temp:  [98.3 F (36.8 C)-101.9 F (38.8 C)] 98.3 F (36.8 C) (04/28 1200) Pulse Rate:  [69-122] 119 (04/28 1600) Resp:  [15-39] 35 (04/28 1600) BP: (91-168)/(40-83) 154/83 mmHg (04/28 1600) SpO2:  [89 %-100 %] 92 % (04/28 1600) FiO2 (%):  [40 %-50 %] 50 %  (04/28 1610) Weight:  [140 lb 10.5 oz (63.8 kg)] 140 lb 10.5 oz (63.8 kg) (04/28 0500)  General: Intermittently more alert. He did tolerate weaning for about 5 hours today Lungs:  Few rhonchi Cor:  regular S1 and S2 no murmurs Abdomen: Soft. Diarrhea has decreased   Lab Results Lab Results  Component Value Date   WBC 4.9 10/25/2013   HGB 7.0* 10/25/2013   HCT 23.1* 10/25/2013   MCV 98.7 10/25/2013   PLT 196 10/25/2013    Lab Results  Component Value Date   CREATININE 0.42* 10/25/2013   BUN 30* 10/25/2013   NA 146 10/25/2013   K 4.0 10/25/2013   CL 106 10/25/2013   CO2 33* 10/25/2013    Lab Results  Component Value Date   ALT 34 10/18/2013   AST 35 10/18/2013   ALKPHOS 101 10/18/2013   BILITOT <0.2* 10/18/2013      HIV 1 RNA Quant (copies/mL)  Date Value  10/08/2013 756*  09/27/2013 13616*  09/13/2013 191478*     CD4 T Cell Abs (/uL)  Date Value  10/24/2013 30*  10/08/2013 30*  09/27/2013 30*   Microbiology: Recent Results (from  the past 240 hour(s))  URINE CULTURE     Status: None   Collection Time    10/17/13  4:59 PM      Result Value Ref Range Status   Specimen Description URINE, CATHETERIZED   Final   Special Requests Normal   Final   Culture  Setup Time     Final   Value: 10/18/2013 02:13     Performed at [facility]   Colony Count     Final   Value: NO GROWTH     Performed at [facility]   Culture     Final   Value: NO GROWTH     Performed at [facility]   Report Status 10/18/2013 FINAL   Final  CULTURE, BLOOD (ROUTINE X 2)     Status: None   Collection Time    10/17/13  5:00 PM      Result Value Ref Range Status   Specimen Description BLOOD RIGHT RIGHT ANTECUBITAL   Final   Special Requests BOTTLES DRAWN AEROBIC AND ANAEROBIC 10CC   Final   Culture  Setup Time     Final   Value: 10/17/2013 18:56     Performed at [facility]   Culture     Final   Value: NO GROWTH 5 DAYS     Performed at [facility]   Report  Status 10/23/2013 FINAL   Final  CULTURE, RESPIRATORY (NON-EXPECTORATED)     Status: None   Collection Time    10/17/13  5:00 PM      Result Value Ref Range Status   Specimen Description TRACHEAL ASPIRATE   Final   Special Requests NONE   Final   Gram Stain     Final   Value: MODERATE WBC PRESENT, PREDOMINANTLY PMN     NO SQUAMOUS EPITHELIAL CELLS SEEN     NO ORGANISMS SEEN     Performed at [facility]   Culture     Final   Value: Non-Pathogenic Oropharyngeal-type Flora Isolated.     Performed at [facility]   Report Status 10/19/2013 FINAL   Final  CULTURE, BLOOD (ROUTINE X 2)     Status: None   Collection Time    10/17/13  5:05 PM      Result Value Ref Range Status   Specimen Description BLOOD RIGHT ARM   Final   Special Requests BOTTLES DRAWN AEROBIC AND ANAEROBIC 10CC   Final   Culture  Setup Time     Final   Value: 10/17/2013 18:56     Performed at [facility]   Culture     Final   Value: NO GROWTH 5 DAYS     Performed at [facility]   Report Status 10/23/2013 FINAL   Final  CULTURE, RESPIRATORY (NON-EXPECTORATED)     Status: None   Collection Time    10/19/13  5:53 PM      Result Value Ref Range Status   Specimen Description TRACHEAL ASPIRATE   Final   Special Requests NONE   Final   Gram Stain     Final   Value: ABUNDANT WBC PRESENT,BOTH PMN AND MONONUCLEAR     NO SQUAMOUS EPITHELIAL CELLS SEEN     NO ORGANISMS SEEN     Performed at [facility]   Culture     Final   Value: Non-Pathogenic Oropharyngeal-type Flora Isolated.     Performed at [facility]  Report Status 10/22/2013 FINAL   Final  CULTURE, BLOOD (ROUTINE X 2)     Status: None   Collection Time    10/19/13  5:55 PM      Result Value Ref Range Status   Specimen Description BLOOD RIGHT ARM   Final   Special Requests BOTTLES DRAWN AEROBIC AND ANAEROBIC 8CC   Final   Culture  Setup Time     Final   Value: 10/19/2013 21:55     Performed at [facility]   Culture     Final   Value: NO GROWTH 5 DAYS     Performed at [facility]   Report Status 10/25/2013 FINAL   Final  CULTURE, BLOOD (ROUTINE X 2)     Status: None   Collection Time    10/19/13  6:00 PM      Result Value Ref Range Status   Specimen Description BLOOD RIGHT ARM   Final   Special Requests BOTTLES DRAWN AEROBIC ONLY 2CC   Final   Culture  Setup Time     Final   Value: 10/19/2013 21:55     Performed at [facility]   Culture     Final   Value: NO GROWTH 5 DAYS     Performed at [facility]   Report Status 10/25/2013 FINAL   Final  CLOSTRIDIUM DIFFICILE BY PCR     Status: None   Collection Time    10/20/13  6:10 AM      Result Value Ref Range Status   C difficile by pcr NEGATIVE  NEGATIVE Final   Comment: [facility]  CULTURE, RESPIRATORY (NON-EXPECTORATED)     Status: None   Collection Time    10/23/13 11:40 AM      Result Value Ref Range Status   Specimen Description TRACHEAL ASPIRATE   Final   Special Requests Immunocompromised   Final   Gram Stain     Final   Value: RARE WBC PRESENT,BOTH PMN AND MONONUCLEAR     RARE SQUAMOUS EPITHELIAL CELLS PRESENT     MODERATE GRAM POSITIVE COCCI     IN PAIRS FEW GRAM NEGATIVE RODS     RARE GRAM POSITIVE RODS   Culture     Final   Value: Non-Pathogenic Oropharyngeal-type Flora Isolated.     Performed at [facility]   Report Status 10/25/2013 FINAL   Final  CULTURE, BLOOD (ROUTINE X 2)     Status: None   Collection Time    10/23/13 11:55 AM      Result Value Ref Range Status   Specimen Description BLOOD RIGHT ARM   Final   Special Requests     Final   Value: BOTTLES DRAWN AEROBIC AND ANAEROBIC 4CC BOTH BOTTLES   Culture  Setup Time     Final   Value: 10/23/2013 18:28     Performed at [facility]   Culture     Final   Value:        BLOOD CULTURE RECEIVED NO GROWTH TO DATE CULTURE WILL BE HELD FOR 5 DAYS BEFORE ISSUING A FINAL NEGATIVE REPORT       Performed at [facility]   Report Status PENDING   Incomplete  URINE CULTURE     Status: None   Collection Time    10/23/13 12:02 PM      Result Value Ref Range Status   Specimen Description URINE, CATHETERIZED   Final  Special Requests Immunocompromised   Final   Culture  Setup Time     Final   Value: 10/23/2013 20:38     Performed at [facility]   Colony Count     Final   Value: NO GROWTH     Performed at [facility]   Culture     Final   Value: NO GROWTH     Performed at [facility]   Report Status 10/24/2013 FINAL   Final    Studies/Results: Dg Chest Port 1 View  10/25/2013   CLINICAL DATA:  Acute respiratory failure.  Pneumocystis pneumonia.  EXAM: PORTABLE CHEST - 1 VIEW  COMPARISON:  10/24/2013  FINDINGS: Tracheostomy tube, feeding tube, and right-sided chest tube and PICC all appear unchanged. There is small amount of subcutaneous emphysema in the right supraclavicular region. There is no definable right apical pneumothorax although I suspect there is residual pleural air at the apex.  Extensive bilateral consolidative pneumonia is unchanged. Heart size and vascularity are normal.  IMPRESSION: No change in the pulmonary infiltrates. New slight subcutaneous emphysema in the right supraclavicular region. I suspect there is a tiny residual right apical pneumothorax.   Electronically Signed   By: Geanie Cooley M.D.   On: 10/25/2013 08:04   Dg Chest Port 1 View  10/24/2013   CLINICAL DATA:  ARDS, pneumothorax, RIGHT chest tube, tracheostomy, history hypertension, HIV  EXAM: PORTABLE CHEST - 1 VIEW  COMPARISON:  Portable exam 0512 hr compared to 10/23/2013  FINDINGS: Endotracheal tube and feeding tube unchanged.  LEFT arm PICC line tip projects over mid SVC.  Stable RIGHT thoracostomy tube.  Mild enlargement of cardiac silhouette.  Stable mediastinal contours.  Diffuse airspace infiltrates consistent with history of ARDS.  No pleural effusion.   Small RIGHT apex pneumothorax again noted.  IMPRESSION: Persistent small RIGHT apex pneumothorax despite thoracostomy tube.  Diffuse BILATERAL pulmonary infiltrates consistent with history of ARDS, grossly unchanged.   Electronically Signed   By: Ulyses Southward M.D.   On: 10/24/2013 07:21    Assessment: He continues to have intermittent fevers that may be related to his ARDS. If his fevers continue will consider a reevaluation for new infection soon. There are no guidelines for optimal duration of ganciclovir therapy and HIV infected patients with probable CMV pneumonia. 3-6 weeks of therapy it is the general with lymphoma. I will consider stopping ganciclovir toward the end of this week. I will continue fluconazole through May 4 giving him a total of 2 weeks of treatment following the first negative set of blood cultures after documentation of his transient Candida albicans fungemia.  Plan: 1. Continue current antiretroviral and prophylactic antibiotic therapies  2. Consider stopping ganciclovir in the next few days 3. Continue fluconazole through May 4  Cliffton Asters, MD [facility] for Infectious Disease [facility] [phone] pager   [phone] cell 10/25/2013, 5:05 PM

## 2013-10-25 NOTE — Progress Notes (Signed)
PULMONARY / CRITICAL CARE MEDICINE   Name: William Bender MRN: [id number] DOB: 08-27-1965    ADMISSION DATE:  09/30/2013 CONSULTATION DATE:  09/28/2013  REFERRING MD :  EDP  CHIEF COMPLAINT:  Shortness of breath, fever, cough  BRIEF PATIENT DESCRIPTION:  48 yo with recently diagnosis of HIV/AIDS (CD4 = 30 on 3/31) admitted 4/4 with acute hypoxemic respiratory failure and bilateral pneumonia. Of note PCP negative preadmission on Bactrim Px.  SIGNIFICANT / STUDIES EVENTS: 4/04 Chest CTA:  No embolism, bilateral airspace disease 4/09 Chest CT: Pneumomediastinum, small R pneumothorax, bilateral airspace disease 4/15 RUE Venous Doppler: no DVT 4/18 Echocardiogram: EF 60%, no RWMA 4/24 Spontaneous R PTX > chest tube placed 4/24 Trach tube placed 4/26 BLE venous Dopplers: No DVT noted 4/26 BUE venous Dopplers: No DVT noted  LINES / TUBES: ETT 4/5 >> 4/24 IJ CVL 4/5 >> 4/17 LUE PICC 4/17 >>  Trach tube Marland KitchenJY) 4/24 >>  R chest tube 4/24 >>   MICRO: HIV 1 RNA Quant  3/17: 078,675  3/31: 13,616    4/11: 756  4/28:   AFB  BAL 4/05 >> smear neg >> NGTD Fungal BAL 4/05 >> smear neg >> NGTD Blood 4/04 >> NEG CMV BAL 4/05 >> NEG Bact BAL 4/05 >> NEG Pneumocystis DFA 4/05 >> NEG Blood 4/11 >> NEG AFB blood cx 4/11 >> NGTD RVP 4/11 >> PARAINFLUENZA 2 Blast Abs 4/13 >> NEG Histo Abs 4/13 >> NEG Fungal BAL 4/13 >> CANDIDA ALBICANS Bact BAL 4/13 >> NEG M TB complex PCR, BAL 4/13 >> NEG CMV DNA probe (serum) 4/13 >> POSITIVE (101,862) Blood 4/16 >> 1/2 CANDIDA ALBICANS Resp 4/16 >> NEG Urine 4/16  >> NEG Resp 4/20 >> NEG Blood 4/20 >> NEG Resp 422 >> NEG Blood 4/22 >> NEG C diff 4/23 >> NEG Urine 4/26 >> NEG Resp 4/26 >> NOF Blood 4/26 >>    ANTIBIOTICS: (all abx per ID) HAART Vanc 4/04 >> off Cefepime 4/04 >> off Micafungin 4/17 >> 4/24 Gancyclovir 4/6 >>  Fluconazole 4/24 >>  Azithromycin 4/19 >>  TMP-SMX 4/04 >>    SUBJECTIVE:  Intermittent agitation. Responds to  voice but not F/C.   VITAL SIGNS: Temp:  [98.1 F (36.7 C)-101.9 F (38.8 C)] 98.3 F (36.8 C) (04/28 1200) Pulse Rate:  [69-122] 110 (04/28 1200) Resp:  [15-39] 31 (04/28 1200) BP: (91-139)/(40-79) 117/60 mmHg (04/28 1200) SpO2:  [89 %-100 %] 92 % (04/28 1200) FiO2 (%):  [40 %-50 %] 50 % (04/28 0830) Weight:  [63.8 kg (140 lb 10.5 oz)] 63.8 kg (140 lb 10.5 oz) (04/28 0500)  VENTILATOR SETTINGS: Vent Mode:  [-] PCV FiO2 (%):  [40 %-50 %] 50 % Set Rate:  [18 bmp] 18 bmp PEEP:  [5 cmH20] 5 cmH20 Pressure Support:  [18 cmH20] 18 cmH20 Plateau Pressure:  [22 cmH20-29 cmH20] 29 cmH20  INTAKE / OUTPUT: Intake/Output     04/27 0701 - 04/28 0700 04/28 0701 - 04/29 0700   I.V. (mL/kg) 1565.3 (24.5) 390.9 (6.1)   Other 120    NG/GT 1200 540   IV Piggyback 400 100   Total Intake(mL/kg) 3285.3 (51.5) 1030.9 (16.2)   Urine (mL/kg/hr) 1725 (1.1) 285 (0.7)   Stool 415 (0.3)    Chest Tube  30 (0.1)   Total Output 2140 315   Net +1145.3 +715.9         PHYSICAL EXAMINATION: Gen: vented, RASS -1 to +1 HEENT: Trach site clean, NG in place PULM: Mildly labored, scattered  rhonchi, no air leak CV: regular, no murmur AB: BS+, soft Ext: warm no edema Neuro: no focal deficits  LABS: I have reviewed all of today's lab results. Relevant abnormalities are discussed in the A/P section  CXR: William Bender ARDS pattern, PTX not seen  ASSESSMENT / PLAN:  PULMONARY A:  Prolonged VDRF Non-resolving ARDS Parainfluenza virus PNA  Pneumomediastinum, resolved Spontaneous R pneumothorax Tracheostomy status  Vent dyssynchrony - improved in PSV mode P:   Wean in PSV mode as long as tolerated  Cont vent bundle Cont chest tube on water seal Daily CXR as long as chest tube in place  CARDIOVASCULAR A:  Intermittent sinus tachycardia, reactive (fever, pain, etc) Abnormal EKG 4/24 with negative biomarkers Hypertension, controlled P:  Cont to monitor BP and rhythm  RENAL A:   Hypernatremia Met  alkalosis, mild P:   Monitor BMET intermittently Monitor I/Os Correct electrolytes as indicated Cont free water repletion  GASTROINTESTINAL A:   Protein calorie malnutrition P:   SUP: Pantoprazole per tube Continue TFs  HEMATOLOGIC A:   Anemia of critical illness without evidence of acute blood loss P:  DVT px: LMWH Monitor CBC intermittently Transfuse per usual ICU guidelines  INFECTIOUS A:   HIV/AIDS Parainfluenza virus pneumonitis Candidemia CMV viremia P:   Micro and as above - ID service managing  ENDOCRINE A:   Steroid induced hyperglycemia P:   Cont SSI Cont Lantus Cont to wean steroids to off  NEUROLOGIC A:    Acute encephalopathy ICU associated discomfort P:   Goal RASS 0 to -1 Cont dexmedetomidine Cont Fentanyl patch  Cont PRN lorazepam and hydromorphone Increase quetiapine 4/28 Add clonazepam 4/28 Add clonidine patch 4/28 with goal of weaning off dexmedetomidine   PCCM ATTENDING: I have interviewed and examined the patient and reviewed the database. I have formulated the assessment and plan as reflected in the note above with amendments made by me. 40 mins of direct critical care time provided  Father updated in detail @ bedside Will meet with wife, mother and father 4/29 AM Need to clarify end points  William Border, MD;  PCCM service; Mobile [phone]

## 2013-10-26 ENCOUNTER — Inpatient Hospital Stay (HOSPITAL_COMMUNITY): Payer: BC Managed Care – PPO

## 2013-10-26 DIAGNOSIS — J9600 Acute respiratory failure, unspecified whether with hypoxia or hypercapnia: Secondary | ICD-10-CM

## 2013-10-26 DIAGNOSIS — J189 Pneumonia, unspecified organism: Secondary | ICD-10-CM

## 2013-10-26 LAB — BLOOD GAS, ARTERIAL
Acid-Base Excess: 7.9 mmol/L — ABNORMAL HIGH (ref 0.0–2.0)
Bicarbonate: 32.1 mEq/L — ABNORMAL HIGH (ref 20.0–24.0)
Drawn by: 232811
FIO2: 0.5 %
LHR: 25 {breaths}/min
O2 SAT: 94.9 %
PEEP: 5 cmH2O
PH ART: 7.449 (ref 7.350–7.450)
Patient temperature: 100.3
TCO2: 30.4 mmol/L (ref 0–100)
pCO2 arterial: 47.6 mmHg — ABNORMAL HIGH (ref 35.0–45.0)
pO2, Arterial: 77.8 mmHg — ABNORMAL LOW (ref 80.0–100.0)

## 2013-10-26 LAB — COMPREHENSIVE METABOLIC PANEL
ALBUMIN: 1.4 g/dL — AB (ref 3.5–5.2)
ALT: 143 U/L — ABNORMAL HIGH (ref 0–53)
AST: 40 U/L — AB (ref 0–37)
Alkaline Phosphatase: 74 U/L (ref 39–117)
BILIRUBIN TOTAL: 0.3 mg/dL (ref 0.3–1.2)
BUN: 20 mg/dL (ref 6–23)
CO2: 32 mEq/L (ref 19–32)
CREATININE: 0.4 mg/dL — AB (ref 0.50–1.35)
Calcium: 8 mg/dL — ABNORMAL LOW (ref 8.4–10.5)
Chloride: 100 mEq/L (ref 96–112)
GFR calc Af Amer: >90 mL/min (ref >90)
GFR calc non Af Amer: >90 mL/min (ref >90)
Glucose, Bld: 127 mg/dL — ABNORMAL HIGH (ref 70–99)
POTASSIUM: 4.1 meq/L (ref 3.7–5.3)
Sodium: 139 mEq/L (ref 137–147)
TOTAL PROTEIN: 4.4 g/dL — AB (ref 6.0–8.3)

## 2013-10-26 LAB — CBC
HCT: 25.9 % — ABNORMAL LOW (ref 39.0–52.0)
HEMATOCRIT: 20.1 % — AB (ref 39.0–52.0)
HEMOGLOBIN: 8.3 g/dL — AB (ref 13.0–17.0)
Hemoglobin: 6.2 g/dL — CL (ref 13.0–17.0)
MCH: 29.8 pg (ref 26.0–34.0)
MCH: 29.5 pg (ref 26.0–34.0)
MCHC: 30.8 g/dL (ref 30.0–36.0)
MCHC: 32 g/dL (ref 30.0–36.0)
MCV: 96.6 fL (ref 78.0–100.0)
MCV: 92.2 fL (ref 78.0–100.0)
PLATELETS: 149 10*3/uL — AB (ref 150–400)
Platelets: 170 10*3/uL (ref 150–400)
RBC: 2.08 MIL/uL — AB (ref 4.22–5.81)
RBC: 2.81 MIL/uL — ABNORMAL LOW (ref 4.22–5.81)
RDW: 17.5 % — ABNORMAL HIGH (ref 11.5–15.5)
RDW: 18.4 % — ABNORMAL HIGH (ref 11.5–15.5)
WBC: 4.1 10*3/uL (ref 4.0–10.5)
WBC: 8.3 10*3/uL (ref 4.0–10.5)

## 2013-10-26 LAB — HIV-1 RNA QUANT-NO REFLEX-BLD
HIV 1 RNA Quant: 53 copies/mL — ABNORMAL HIGH (ref <20)
HIV-1 RNA QUANT, LOG: 1.72 {Log} — AB (ref <1.30)

## 2013-10-26 LAB — GLUCOSE, CAPILLARY
GLUCOSE-CAPILLARY: 120 mg/dL — AB (ref 70–99)
GLUCOSE-CAPILLARY: 158 mg/dL — AB (ref 70–99)
Glucose-Capillary: 123 mg/dL — ABNORMAL HIGH (ref 70–99)
Glucose-Capillary: 183 mg/dL — ABNORMAL HIGH (ref 70–99)
Glucose-Capillary: 207 mg/dL — ABNORMAL HIGH (ref 70–99)

## 2013-10-26 LAB — PREPARE RBC (CROSSMATCH)

## 2013-10-26 MED ORDER — CLONAZEPAM 0.5 MG PO TBDP: 2.0000 mg | ORAL_TABLET | Freq: Two times a day (BID) | ORAL | Status: DC

## 2013-10-26 MED ORDER — SODIUM CHLORIDE 0.9 % IV SOLN
1.0000 mg/h | INTRAVENOUS | Status: DC
  Administered 2013-10-26: 2 mg/h via INTRAVENOUS
  Filled 2013-10-26: qty 5

## 2013-10-26 MED ORDER — QUETIAPINE FUMARATE 200 MG PO TABS
200.0000 mg | ORAL_TABLET | Freq: Two times a day (BID) | ORAL | Status: DC
  Administered 2013-10-26 – 2013-10-29 (×7): 200 mg
  Filled 2013-10-26 (×9): qty 1

## 2013-10-26 MED ORDER — METHYLPREDNISOLONE SODIUM SUCC 125 MG IJ SOLR
80.0000 mg | Freq: Two times a day (BID) | INTRAMUSCULAR | Status: DC
  Administered 2013-10-26 – 2013-11-02 (×14): 80 mg via INTRAVENOUS
  Administered 2013-11-02: 125 mg via INTRAVENOUS
  Administered 2013-11-03 – 2013-11-05 (×5): 80 mg via INTRAVENOUS
  Filled 2013-10-26 (×22): qty 1.28

## 2013-10-26 MED ORDER — HYDROMORPHONE HCL PF 1 MG/ML IJ SOLN
0.5000 mg | INTRAMUSCULAR | Status: DC | PRN
  Administered 2013-10-26 (×2): 1 mg via INTRAVENOUS
  Administered 2013-10-27: 2 mg via INTRAVENOUS
  Administered 2013-10-28 – 2013-11-02 (×21): 1 mg via INTRAVENOUS
  Filled 2013-10-26 (×24): qty 1

## 2013-10-26 MED ORDER — CLONAZEPAM 0.5 MG PO TBDP
2.0000 mg | ORAL_TABLET | Freq: Two times a day (BID) | ORAL | Status: DC
  Administered 2013-10-26 – 2013-10-28 (×5): 2 mg
  Filled 2013-10-26 (×5): qty 4

## 2013-10-26 MED ORDER — INSULIN GLARGINE 100 UNIT/ML ~~LOC~~ SOLN
25.0000 [IU] | Freq: Every day | SUBCUTANEOUS | Status: DC
  Administered 2013-10-26 – 2013-11-04 (×10): 25 [IU] via SUBCUTANEOUS
  Filled 2013-10-26 (×11): qty 0.25

## 2013-10-26 MED ORDER — METHADONE HCL 10 MG/ML PO CONC
20.0000 mg | Freq: Three times a day (TID) | ORAL | Status: DC
  Administered 2013-10-26 – 2013-10-28 (×6): 20 mg
  Filled 2013-10-26 (×6): qty 2

## 2013-10-26 NOTE — Progress Notes (Signed)
10/26/13 1130  Clinical Encounter Type  Visited With Patient and family together;Health care provider Fabian November(Shae, RN)  Visit Type Spiritual support;Social support  Spiritual Encounters  Spiritual Needs Prayer;Emotional   Continuing to follow family closely for spiritual and emotional support.  Wife William Bender's focus this morning was on letting William Bender know that he is forgiven, should carry no burden in his heart, and should fight for life.  Parents William Bender and William Bender present as more circumspect and are concerned about how to support William Bender where she is, while also considering difficult prognoses.  Provided pastoral presence, prayer at bedside per William Bender's request, and family check-in prior to scheduled meeting with pulmonologist.  Family plans to request chaplain page when ready for follow-up support after the meeting.  [address]Chaplain Shayana Hornstein [place], [place] [phone]

## 2013-10-26 NOTE — Progress Notes (Signed)
eLink Physician-Brief Progress Note Patient Name: William Bender DOB: 09/12/1965 MRN: [id number]  Date of Service  10/26/2013   HPI/Events of Note   Sacral and anal decub per RN, getting worse  eICU Interventions  Wound care consult   Intervention Category Minor Interventions: Other:  Kalman ShanMurali Davan Nawabi 10/26/2013, 8:20 PM

## 2013-10-26 NOTE — Progress Notes (Signed)
PCCM Acute Evaluation Note  S: Pt with increased WOB, increased respiratory rate, tachycardia.  O: Blood pressure 148/93, pulse 102, temperature 98.5 F (36.9 C), temperature source Oral, resp. rate 34, height 5\' 10"  (1.778 m), weight 140 lb 10.5 oz (63.8 kg), SpO2 95.00%.  General: increased WOB HEENT: mild leak from trach >> improved after inflating cuff with additional 2 cc air Cardiac: regular, tachycardia Chest: b/l rales Abd: soft, non tender Ext: no edema Neuro: RASS -2    CBC Recent Labs     10/24/13  0500  10/25/13  0400  WBC  6.8  4.9  HGB  7.6*  7.0*  HCT  25.1*  23.1*  PLT  229  196   BMET Recent Labs     10/23/13  0850  10/24/13  0500  10/25/13  0400  NA  151*  150*  146  K  3.4*  4.1  4.0  CL  106  108  106  CO2  34*  33*  33*  BUN  60*  39*  30*  CREATININE  0.79  0.51  0.42*  GLUCOSE  205*  179*  161*    Electrolytes Recent Labs     10/23/13  0850  10/24/13  0500  10/25/13  0400  CALCIUM  8.6  8.2*  8.2*   Glucose Recent Labs     10/25/13  0333  10/25/13  0736  10/25/13  1200  10/25/13  1556  10/25/13  1945  10/25/13  2310  GLUCAP  154*  106*  159*  172*  159*  127*    Imaging Dg Chest Port 1 View  10/25/2013   CLINICAL DATA:  Acute respiratory failure.  Pneumocystis pneumonia.  EXAM: PORTABLE CHEST - 1 VIEW  COMPARISON:  10/24/2013  FINDINGS: Tracheostomy tube, feeding tube, and right-sided chest tube and PICC all appear unchanged. There is small amount of subcutaneous emphysema in the right supraclavicular region. There is no definable right apical pneumothorax although I suspect there is residual pleural air at the apex.  Extensive bilateral consolidative pneumonia is unchanged. Heart size and vascularity are normal.  IMPRESSION: No change in the pulmonary infiltrates. New slight subcutaneous emphysema in the right supraclavicular region. I suspect there is a tiny residual right apical pneumothorax.   Electronically Signed   By: Geanie CooleyJim   Maxwell M.D.   On: 10/25/2013 08:04   Dg Chest Port 1 View  10/24/2013   CLINICAL DATA:  ARDS, pneumothorax, RIGHT chest tube, tracheostomy, history hypertension, HIV  EXAM: PORTABLE CHEST - 1 VIEW  COMPARISON:  Portable exam 0512 hr compared to 10/23/2013  FINDINGS: Endotracheal tube and feeding tube unchanged.  LEFT arm PICC line tip projects over mid SVC.  Stable RIGHT thoracostomy tube.  Mild enlargement of cardiac silhouette.  Stable mediastinal contours.  Diffuse airspace infiltrates consistent with history of ARDS.  No pleural effusion.  Small RIGHT apex pneumothorax again noted.  IMPRESSION: Persistent small RIGHT apex pneumothorax despite thoracostomy tube.  Diffuse BILATERAL pulmonary infiltrates consistent with history of ARDS, grossly unchanged.   Electronically Signed   By: Ulyses SouthwardMark  Boles M.D.   On: 10/24/2013 07:21   A:  Acute on chronic respiratory failure 2nd to PNA with ARDS complicated by PTX now with increased WOB.    P:  Increase pressure control to 30 over PEEP 5, increase respiratory rate to 25 Continue sedation/analgesia F/u CXR, ABG in AM  CC time 35 minutes  Coralyn HellingVineet Zafar Debrosse, MD [facility] 10/26/2013, 12:08 AM  Pager:  [phone] After 3pm call: [phone]

## 2013-10-26 NOTE — Progress Notes (Signed)
10/26/13 1600  Clinical Encounter Type  Visited With Family (wife Caryn at bedside)  Visit Type Follow-up;Spiritual support;Social support   Visited with wife Caryn to follow up after meeting this morning.  She summarized the message about treatment plan as discerning "when doing FOR him becomes doing TO him."  Provided pastoral presence and reflective listening.  Will continue to follow, but please also page as needs arise.  [address] Arrian Manson [place], [place] [phone]

## 2013-10-26 NOTE — Progress Notes (Signed)
Patient ID: William Bender, male   DOB: August 23, 1965, 48 y.o.   MRN: [id number]         [facility]    Date of Admission:  10/14/2013           Day 24 ganciclovir        Day 12 antifungal therapy Principal Problem:   Pneumonia Active Problems:   Acute respiratory failure   AIDS   PCP (pneumocystis carinii pneumonia)   CMV (cytomegalovirus)   Sacral decubitus ulcer, stage II   Hyponatremia   Pneumomediastinum   Fungemia   Spontaneous pneumothorax   Pneumothorax on right   . antiseptic oral rinse  15 mL Mouth Rinse QID  . [START ON 10/27/2013] azithromycin  1,200 mg Per Tube Weekly  . chlorhexidine  15 mL Mouth Rinse BID  . clonazepam  2 mg Per Tube BID  . cloNIDine  0.1 mg Transdermal Weekly  . collagenase   Topical Daily  . dolutegravir  50 mg Oral Daily  . emtricitabine-tenofovir  1 tablet Per Tube Daily  . enoxaparin (LOVENOX) injection  40 mg Subcutaneous Daily  . etomidate  40 mg Intravenous Once  . feeding supplement (PRO-STAT SUGAR FREE 64)  30 mL Per Tube BID  . fentaNYL  100 mcg Transdermal Q72H  . fluconazole (DIFLUCAN) IV  400 mg Intravenous Q24H  . free water  200 mL Per Tube Q4H  . ganciclovir (CYTOVENE) IV  5 mg/kg Intravenous Q12H  . insulin aspart  0-15 Units Subcutaneous 6 times per day  . insulin glargine  25 Units Subcutaneous Daily  . methadone  20 mg Per Tube 3 times per day  . methylPREDNISolone (SOLU-MEDROL) injection  80 mg Intravenous Q12H  . metoprolol tartrate  25 mg Per Tube BID  . multivitamin  5 mL Per Tube Daily  . pantoprazole sodium  40 mg Per Tube Daily  . QUEtiapine  200 mg Per Tube BID  . sodium chloride  10-40 mL Intracatheter Q12H  . sulfamethoxazole-trimethoprim  1 tablet Per Tube Daily    Objective: Temp:  [97.4 F (36.3 C)-101.1 F (38.4 C)] 101.1 F (38.4 C) (04/29 1200) Pulse Rate:  [68-132] 102 (04/29 1700) Resp:  [22-48] 48 (04/29 1700) BP: (80-166)/(36-100) 128/71 mmHg (04/29 1700) SpO2:  [90  %-100 %] 96 % (04/29 1700) FiO2 (%):  [50 %] 50 % (04/29 1600) Weight:  [140 lb 14 oz (63.9 kg)] 140 lb 14 oz (63.9 kg) (04/29 0600)  General: Sedation was increased last night after he developed increased work of breathing Lungs:  Few rhonchi. Some yellow-green sputum being suctioned Cor:  regular S1 and S2 no murmurs Abdomen: Soft. Diarrhea has decreased   Lab Results Lab Results  Component Value Date   WBC 8.3 10/26/2013   HGB 8.3* 10/26/2013   HCT 25.9* 10/26/2013   MCV 92.2 10/26/2013   PLT 170 10/26/2013    Lab Results  Component Value Date   CREATININE 0.40* 10/26/2013   BUN 20 10/26/2013   NA 139 10/26/2013   K 4.1 10/26/2013   CL 100 10/26/2013   CO2 32 10/26/2013    Lab Results  Component Value Date   ALT 143* 10/26/2013   AST 40* 10/26/2013   ALKPHOS 74 10/26/2013   BILITOT 0.3 10/26/2013      HIV 1 RNA Quant (copies/mL)  Date Value  10/24/2013 53*  10/08/2013 756*  09/27/2013 13616*     CD4 T Cell Abs (/uL)  Date Value  10/24/2013 30*  10/08/2013 30*  09/27/2013 30*   Microbiology: Recent Results (from the past 240 hour(s))  URINE CULTURE     Status: None   Collection Time    10/17/13  4:59 PM      Result Value Ref Range Status   Specimen Description URINE, CATHETERIZED   Final   Special Requests Normal   Final   Culture  Setup Time     Final   Value: 10/18/2013 02:13     Performed at [facility]   Colony Count     Final   Value: NO GROWTH     [facility]formed [facility]   Culture     Final   Value: NO GROWTH     Performed at [facility]   Report Status 10/18/2013 FINAL   Final  CULTURE, BLOOD (ROUTINE X 2)     Status: None   Collection Time    10/17/13  5:00 PM      Result Value Ref Range Status   Specimen Description BLOOD RIGHT RIGHT ANTECUBITAL   Final   Special Requests BOTTLES DRAWN AEROBIC AND ANAEROBIC 10CC   Final   Culture  Setup Time     Final   Value: 10/17/2013 18:56     Performed at [facility]   Culture      Final   Value: NO GROWTH 5 DAYS     Performed at [facility]   Report Status 10/23/2013 FINAL   Final  CULTURE, RESPIRATORY (NON-EXPECTORATED)     Status: None   Collection Time    10/17/13  5:00 PM      Result Value Ref Range Status   Specimen Description TRACHEAL ASPIRATE   Final   Special Requests NONE   Final   Gram Stain     Final   Value: MODERATE WBC PRESENT, PREDOMINANTLY PMN     NO SQUAMOUS EPITHELIAL CELLS SEEN     NO ORGANISMS SEEN     Performed at [facility]   Culture     Final   Value: Non-Pathogenic Oropharyngeal-type Flora Isolated.     Performed at [facility]   Report Status 10/19/2013 FINAL   Final  CULTURE, BLOOD (ROUTINE X 2)     Status: None   Collection Time    10/17/13  5:05 PM      Result Value Ref Range Status   Specimen Description BLOOD RIGHT ARM   Final   Special Requests BOTTLES DRAWN AEROBIC AND ANAEROBIC 10CC   Final   Culture  Setup Time     Final   Value: 10/17/2013 18:56     Performed at [facility]   Culture     Final   Value: NO GROWTH 5 DAYS     Performed at [facility]   Report Status 10/23/2013 FINAL   Final  CULTURE, RESPIRATORY (NON-EXPECTORATED)     Status: None   Collection Time    10/19/13  5:53 PM      Result Value Ref Range Status   Specimen Description TRACHEAL ASPIRATE   Final   Special Requests NONE   Final   Gram Stain     Final   Value: ABUNDANT WBC PRESENT,BOTH PMN AND MONONUCLEAR     NO SQUAMOUS EPITHELIAL CELLS SEEN     NO ORGANISMS SEEN     Performed at [facility]   Culture     Final   Value: Non-Pathogenic  Oropharyngeal-type Flora Isolated.     Performed at [facility]   Report Status 10/22/2013 FINAL   Final  CULTURE, BLOOD (ROUTINE X 2)     Status: None   Collection Time    10/19/13  5:55 PM      Result Value Ref Range Status   Specimen Description BLOOD RIGHT ARM   Final   Special Requests BOTTLES DRAWN AEROBIC AND ANAEROBIC 8CC   Final    Culture  Setup Time     Final   Value: 10/19/2013 21:55     Performed at [facility]   Culture     Final   Value: NO GROWTH 5 DAYS     Performed at [facility]   Report Status 10/25/2013 FINAL   Final  CULTURE, BLOOD (ROUTINE X 2)     Status: None   Collection Time    10/19/13  6:00 PM      Result Value Ref Range Status   Specimen Description BLOOD RIGHT ARM   Final   Special Requests BOTTLES DRAWN AEROBIC ONLY 2CC   Final   Culture  Setup Time     Final   Value: 10/19/2013 21:55     Performed at [facility]   Culture     Final   Value: NO GROWTH 5 DAYS     Performed at [facility]   Report Status 10/25/2013 FINAL   Final  CLOSTRIDIUM DIFFICILE BY PCR     Status: None   Collection Time    10/20/13  6:10 AM      Result Value Ref Range Status   C difficile by pcr NEGATIVE  NEGATIVE Final   Comment: Performed at [facility]  CULTURE, RESPIRATORY (NON-EXPECTORATED)     Status: None   Collection Time    10/23/13 11:40 AM      Result Value Ref Range Status   Specimen Description TRACHEAL ASPIRATE   Final   Special Requests Immunocompromised   Final   Gram Stain     Final   Value: RARE WBC PRESENT,BOTH PMN AND MONONUCLEAR     RARE SQUAMOUS EPITHELIAL CELLS PRESENT     MODERATE GRAM POSITIVE COCCI     IN PAIRS FEW GRAM NEGATIVE RODS     RARE GRAM POSITIVE RODS   Culture     Final   Value: Non-Pathogenic Oropharyngeal-type Flora Isolated.     Performed at [facility]   Report Status 10/25/2013 FINAL   Final  CULTURE, BLOOD (ROUTINE X 2)     Status: None   Collection Time    10/23/13 11:55 AM      Result Value Ref Range Status   Specimen Description BLOOD RIGHT ARM   Final   Special Requests     Final   Value: BOTTLES DRAWN AEROBIC AND ANAEROBIC 4CC BOTH BOTTLES   Culture  Setup Time     Final   Value: 10/23/2013 18:28     Performed at [facility]   Culture     Final   Value:        BLOOD CULTURE RECEIVED NO  GROWTH TO DATE CULTURE WILL BE HELD FOR 5 DAYS BEFORE ISSUING A FINAL NEGATIVE REPORT     Performed at [facility]   Report Status PENDING   Incomplete  URINE CULTURE     Status: None   Collection Time    10/23/13 12:02 PM      Result  Value Ref Range Status   Specimen Description URINE, CATHETERIZED   Final   Special Requests Immunocompromised   Final   Culture  Setup Time     Final   Value: 10/23/2013 20:38     Performed at [facility]   Colony Count     Final   Value: NO GROWTH     Performed at [facility]   Culture     Final   Value: NO GROWTH     Performed at [facility]   Report Status 10/24/2013 FINAL   Final    Studies/Results: Dg Chest Port 1 View  10/26/2013   CLINICAL DATA:  Respiratory failure.  EXAM: PORTABLE CHEST - 1 VIEW  COMPARISON:  DG CHEST 1V PORT dated 10/25/2013  FINDINGS: Tracheostomy tube, feeding tube, right chest tube, left PICC line in stable position. Stable cardiomegaly. Diffuse unchanged dense bilateral pulmonary infiltrates. Tiny right apical pneumothorax cannot be excluded. Subtle right supraclavicular subcutaneous emphysema cannot be excluded. Mild subcutaneous emphysema noted along the right chest wall.  IMPRESSION: 1. Line and tube positions stable. 2. Tiny right apical pneumothorax again cannot be excluded. No progression. 3. Dense unchanged bilateral pulmonary infiltrates. 4. Stable severe cardiomegaly.   Electronically Signed   By: Maisie Fushomas  Register   On: 10/26/2013 07:27   Dg Chest Port 1 View  10/25/2013   CLINICAL DATA:  Acute respiratory failure.  Pneumocystis pneumonia.  EXAM: PORTABLE CHEST - 1 VIEW  COMPARISON:  10/24/2013  FINDINGS: Tracheostomy tube, feeding tube, and right-sided chest tube and PICC all appear unchanged. There is small amount of subcutaneous emphysema in the right supraclavicular region. There is no definable right apical pneumothorax although I suspect there is residual pleural air at the apex.   Extensive bilateral consolidative pneumonia is unchanged. Heart size and vascularity are normal.  IMPRESSION: No change in the pulmonary infiltrates. New slight subcutaneous emphysema in the right supraclavicular region. I suspect there is a tiny residual right apical pneumothorax.   Electronically Signed   By: Geanie CooleyJim  Maxwell M.D.   On: 10/25/2013 08:04    Assessment: He continues to struggle on the ventilator and has intermittent fevers of uncertain cause. I will repeat blood and sputum cultures tonight.  Plan: 1. Repeat blood and sputum cultures tonight 2. Consider stopping ganciclovir tomorrow 3. Continue fluconazole through May 4 4. I discussed the current situation and plan with his wife, Germaine PomfretCaryn  Rosell Khouri, MD [facility] [phone] pager   [phone] cell 10/26/2013, 5:10 PM

## 2013-10-26 NOTE — Progress Notes (Signed)
eLink Physician-Brief Progress Note Patient Name: William Bender DOB: 09/10/1965 MRN: [id number]  Date of Service  10/26/2013   HPI/Events of Note  Hgb 6.2 down from 7.0   eICU Interventions  Plan: Transfuse 1 unit pRBC Post-transfusion CBC   Intervention Category Intermediate Interventions: Bleeding - evaluation and treatment with blood products  Dorise Hisslizabeth C Ervie Mccard 10/26/2013, 6:22 AM

## 2013-10-26 NOTE — Progress Notes (Signed)
PULMONARY / CRITICAL CARE MEDICINE   Name: William Bender MRN: [id number] DOB: 06-19-1966    ADMISSION DATE:  10/12/2013 CONSULTATION DATE:  10/26/2013  REFERRING MD :  EDP  CHIEF COMPLAINT:  Shortness of breath, fever, cough  BRIEF PATIENT DESCRIPTION:  48 yo with recently diagnosis of HIV/AIDS (CD4 = 30 on 3/31) admitted 4/4 with acute hypoxemic respiratory failure and bilateral pneumonia. Of note PCP negative preadmission on Bactrim Px.  SIGNIFICANT / STUDIES EVENTS: 4/04 Chest CTA:  No embolism, bilateral airspace disease 4/09 Chest CT: Pneumomediastinum, small R pneumothorax, bilateral airspace disease 4/15 RUE Venous Doppler: no DVT 4/18 Echocardiogram: EF 60%, no RWMA 4/24 Spontaneous R PTX > chest tube placed 4/24 Trach tube placed 4/26 BLE venous Dopplers: No DVT noted 4/26 BUE venous Dopplers: No DVT noted 4/29 placed back on IV dilaudid drip.PC increased to 25 and fio2 to 50% per float MD. 4/29 Hgb 6.2. One unit PRBCs transfused 4/29 Pierce conference with wife, mother and father. Plan to continue full support but not escalate much from current level. Specifically discussed was possibility of ACLS in event of cardiac arrest and HD in event of acute renal failure. It was agreed that these would be excessive interventions in his current status  LINES / TUBES: ETT 4/5 >> 4/24 IJ CVL 4/5 >> 4/17 LUE PICC 4/17 >>  Trach tube [name]) 4/24 >>  R chest tube 4/24 >>   MICRO: HIV 1 RNA Quant  3/17: 786,767  3/31: 13,616    4/11: 756  4/27: 53 AFB  BAL 4/05 >> smear neg >> NGTD Fungal BAL 4/05 >> smear neg >> NGTD Blood 4/04 >> NEG CMV BAL 4/05 >> NEG Bact BAL 4/05 >> NEG Pneumocystis DFA 4/05 >> NEG Blood 4/11 >> NEG AFB blood cx 4/11 >> NGTD RVP 4/11 >> PARAINFLUENZA 2 Blast Abs 4/13 >> NEG Histo Abs 4/13 >> NEG Fungal BAL 4/13 >> CANDIDA ALBICANS Bact BAL 4/13 >> NEG M TB complex PCR, BAL 4/13 >> NEG CMV DNA probe (serum) 4/13 >> POSITIVE (101,862) Blood 4/16 >> 1/2  CANDIDA ALBICANS Resp 4/16 >> NEG Urine 4/16  >> NEG Resp 4/20 >> NEG Blood 4/20 >> NEG Resp 422 >> NEG Blood 4/22 >> NEG C diff 4/23 >> NEG Urine 4/26 >> NEG Resp 4/26 >> NOF Blood 4/26 >>  Resp 4/29 >>  Blood 4/29 >>    ANTIBIOTICS: (all abx per ID) HAART Vanc 4/04 >> off Cefepime 4/04 >> off Micafungin 4/17 >> 4/24 Gancyclovir 4/6 >>  Fluconazole 4/24 >>  Azithromycin 4/19 >>  TMP-SMX 4/04 >>    SUBJECTIVE:  Heavily sedated, Increased sedation and PCV added during the night  VITAL SIGNS: Temp:  [97.4 F (36.3 C)-98.5 F (36.9 C)] 97.9 F (36.6 C) (04/29 0940) Pulse Rate:  [68-132] 86 (04/29 0940) Resp:  [22-39] 36 (04/29 0940) BP: (80-168)/(36-100) 164/84 mmHg (04/29 0940) SpO2:  [89 %-100 %] 90 % (04/29 0940) FiO2 (%):  [50 %] 50 % (04/29 0940) Weight:  [63.9 kg (140 lb 14 oz)] 63.9 kg (140 lb 14 oz) (04/29 0600)  VENTILATOR SETTINGS: Vent Mode:  [-] PCV FiO2 (%):  [50 %] 50 % Set Rate:  [15 bmp-25 bmp] 25 bmp PEEP:  [5 cmH20] 5 cmH20 Pressure Support:  [18 cmH20] 18 cmH20 Plateau Pressure:  [25 cmH20-32 cmH20] 32 cmH20  INTAKE / OUTPUT: Intake/Output     04/28 0701 - 04/29 0700 04/29 0701 - 04/30 0700   I.V. (mL/kg) 1791.7 (28) 189.7 (3)  Blood  312.5   Other     NG/GT 2290 500   IV Piggyback 400 100   Total Intake(mL/kg) 4481.7 (70.1) 1102.2 (17.2)   Urine (mL/kg/hr) 1735 (1.1) 250 (1.4)   Stool     Chest Tube 70 (0)    Total Output 1805 250   Net +2676.7 +852.2         PHYSICAL EXAMINATION: Gen: vented, RASS -1 HEENT: Trach site clean, NG in place PULM: Mildly labored, scattered rhonchi, no air leak CV: regular, no murmur AB: BS+, soft Ext: warm no edema Neuro: heavily sedated  LABS: reviewed CXR: Vance ARDS pattern,  R PTX tiny  ASSESSMENT / PLAN:  PULMONARY A:  Prolonged VDRF ARDS - appears to worsen as steroids tapered  Parainfluenza virus PNA  Pneumomediastinum, resolved Spontaneous R pneumothorax Tracheostomy status   Vent dyssynchrony P:   Vent settings reviewed and adjusted Cont vent bundle Cont chest tube on water seal Daily CXR as long as chest tube in place Increase systemic steroids  CARDIOVASCULAR A:  Intermittent sinus tachycardia, reactive (fever, pain, etc) Abnormal EKG 4/24 with negative biomarkers Hypertension, controlled P:  Cont to monitor BP and rhythm  RENAL  A:   Hypernatremia(resolved) Met alkalosis, mild  Recent Labs Lab 10/24/13 0500 10/25/13 0400 10/26/13 0600  NA 150* 146 139    P:   Monitor BMET intermittently Monitor I/Os Correct electrolytes as indicated  GASTROINTESTINAL A:   Protein calorie malnutrition P:   SUP: Pantoprazole per tube Continue TFs  HEMATOLOGIC  Recent Labs  10/25/13 0400 10/26/13 0600  HGB 7.0* 6.2*    A:   Anemia of critical illness without evidence of acute blood loss P:  DVT px: LMWH Monitor CBC intermittently Transfuse per usual ICU guidelines  INFECTIOUS A:   HIV/AIDS Parainfluenza virus pneumonitis Candidemia CMV viremia P:   Micro and as above - ID service managing  ENDOCRINE CBG (last 3)   Recent Labs  10/25/13 2310 10/26/13 0344 10/26/13 0755  GLUCAP 127* 123* 120*     A:   Steroid induced hyperglycemia P:   Cont SSI Cont Lantus  NEUROLOGIC A:    Acute encephalopathy ICU associated discomfort P:   Goal RASS 0 to -1 Cont dexmedetomidine (consider dc not effective) Cont Fentanyl patch  Cont PRN lorazepam and hydromorphone(4/29 back on drip) Increase quetiapine 4/28 Add clonazepam 4/28, increased 4/29 Added clonidine patch 4/28 with goal of weaning off dexmedetomidine Add methadone 4/29  Richardson Landry Minor ACNP Maryanna Shape PCCM Pager [phone] till 3 pm If no answer page [phone] 10/26/2013, 10:04 AM  PCCM ATTENDING: I have interviewed and examined the patient and reviewed the database. I have formulated the assessment and plan as reflected in the note above with amendments made by me.  60 mins of direct critical care time provided including time spent in family conference discussing goals of care  Merton Border, MD;  PCCM service; Mobile [phone]

## 2013-10-26 NOTE — Progress Notes (Signed)
Called [facility] regarding patients increased work of breathing. Heavy accessory /abdominal muscle use (appearance similar to guppy breathing). Dilaudid and Ativan used at PRN intervals was ineffective. No fever noted, but very heavy and profuse night sweats.

## 2013-10-27 ENCOUNTER — Telehealth: Payer: Self-pay | Admitting: Licensed Clinical Social Worker

## 2013-10-27 ENCOUNTER — Inpatient Hospital Stay (HOSPITAL_COMMUNITY): Payer: BC Managed Care – PPO

## 2013-10-27 DIAGNOSIS — J111 Influenza due to unidentified influenza virus with other respiratory manifestations: Secondary | ICD-10-CM

## 2013-10-27 DIAGNOSIS — B379 Candidiasis, unspecified: Secondary | ICD-10-CM

## 2013-10-27 LAB — GLUCOSE, CAPILLARY
GLUCOSE-CAPILLARY: 159 mg/dL — AB (ref 70–99)
Glucose-Capillary: 141 mg/dL — ABNORMAL HIGH (ref 70–99)
Glucose-Capillary: 144 mg/dL — ABNORMAL HIGH (ref 70–99)
Glucose-Capillary: 152 mg/dL — ABNORMAL HIGH (ref 70–99)
Glucose-Capillary: 190 mg/dL — ABNORMAL HIGH (ref 70–99)
Glucose-Capillary: 156 mg/dL — ABNORMAL HIGH (ref 70–99)

## 2013-10-27 LAB — CBC
HCT: 23.8 % — ABNORMAL LOW (ref 39.0–52.0)
HEMOGLOBIN: 7.4 g/dL — AB (ref 13.0–17.0)
MCH: 29.4 pg (ref 26.0–34.0)
MCHC: 31.1 g/dL (ref 30.0–36.0)
MCV: 94.4 fL (ref 78.0–100.0)
Platelets: 170 10*3/uL (ref 150–400)
RBC: 2.52 MIL/uL — ABNORMAL LOW (ref 4.22–5.81)
RDW: 18.5 % — ABNORMAL HIGH (ref 11.5–15.5)
WBC: 6.5 10*3/uL (ref 4.0–10.5)

## 2013-10-27 LAB — BASIC METABOLIC PANEL
BUN: 19 mg/dL (ref 6–23)
CO2: 30 mEq/L (ref 19–32)
Calcium: 7.9 mg/dL — ABNORMAL LOW (ref 8.4–10.5)
Chloride: 104 mEq/L (ref 96–112)
Creatinine, Ser: 0.33 mg/dL — ABNORMAL LOW (ref 0.50–1.35)
GFR calc Af Amer: >90 mL/min (ref >90)
GLUCOSE: 164 mg/dL — AB (ref 70–99)
POTASSIUM: 4.6 meq/L (ref 3.7–5.3)
SODIUM: 141 meq/L (ref 137–147)

## 2013-10-27 LAB — PROTIME-INR
INR: 1.21 (ref 0.00–1.49)
Prothrombin Time: 15 seconds (ref 11.6–15.2)

## 2013-10-27 LAB — TYPE AND SCREEN
ABO/RH(D): A POS
Antibody Screen: NEGATIVE
Unit division: 0

## 2013-10-27 LAB — APTT: aPTT: 28 seconds (ref 24–37)

## 2013-10-27 MED ORDER — PRO-STAT SUGAR FREE PO LIQD
30.0000 mL | Freq: Four times a day (QID) | ORAL | Status: DC
  Administered 2013-10-27 – 2013-11-05 (×37): 30 mL
  Filled 2013-10-27 (×43): qty 30

## 2013-10-27 MED ORDER — DEXMEDETOMIDINE HCL IN NACL 400 MCG/100ML IV SOLN
0.0000 ug/kg/h | INTRAVENOUS | Status: DC
  Administered 2013-10-27: 0.6 ug/kg/h via INTRAVENOUS
  Administered 2013-10-27: 0.7 ug/kg/h via INTRAVENOUS
  Administered 2013-10-28: 0.4 ug/kg/h via INTRAVENOUS
  Filled 2013-10-27: qty 200

## 2013-10-27 MED ORDER — OXEPA PO LIQD
1000.0000 mL | ORAL | Status: DC
Start: 2013-10-28 — End: 2013-11-06
  Administered 2013-10-28 – 2013-11-05 (×9): 1000 mL
  Filled 2013-10-27 (×10): qty 1000

## 2013-10-27 NOTE — Progress Notes (Signed)
Called pharmacy for methadone.

## 2013-10-27 NOTE — Telephone Encounter (Signed)
[facility] sent a fax to [facility] stating that they are trying  to ship medications to the patient but not able to reach him by phone to set up delivery. I called them to let them know that the patient is in the hospital and has been there for awhile now, we will notify them when he recovers and is discharged.

## 2013-10-27 NOTE — Progress Notes (Signed)
eLink Physician-Brief Progress Note Patient Name: Tennis MustBrent Mort DOB: 06/29/1966 MRN: [id number]  Date of Service  10/27/2013   HPI/Events of Note   Marked increased work of breathing Volumes good on vent, not double-clutching Oxygenation OK resp muscle fatigue? Withdrawal?  eICU Interventions  Increase dilaudid gtt to 2mg /hr Continue methadone, clonazepam   Intervention Category Major Interventions: Respiratory failure - evaluation and management  Lupita LeashDouglas B McQuaid 10/27/2013, 2:27 AM

## 2013-10-27 NOTE — Progress Notes (Signed)
ANTIBIOTIC CONSULT NOTE - FOLLOW UP  Pharmacy Consult for Fluconazole Indication: Candidemia  Allergies  Allergen Reactions  . Penicillins Rash    Patient Measurements: Height: 5\' 10"  (177.8 cm) Weight: 139 lb 12.4 oz (63.4 kg) IBW/kg (Calculated) : 73  Vital Signs: Temp: 96.7 F (35.9 C) (04/30 0800) Temp src: Axillary (04/30 0800) BP: 121/54 mmHg (04/30 0900) Pulse Rate: 71 (04/30 0900) Intake/Output from previous day: 04/29 0701 - 04/30 0700 In: 2883 [I.V.:670.5; Blood:312.5; NG/GT:1500; IV Piggyback:400] Out: 2220 [Urine:2150; Chest Tube:70] Intake/Output from this shift: Total I/O In: 262.4 [I.V.:62.4; NG/GT:100; IV Piggyback:100] Out: 130 [Urine:130]  Labs:  Recent Labs  10/25/13 0400 10/26/13 0600 10/26/13 1315 10/27/13 0455  WBC 4.9 4.1 8.3 6.5  HGB 7.0* 6.2* 8.3* 7.4*  PLT 196 149* 170 170  CREATININE 0.42* 0.40*  --  0.33*   Estimated Creatinine Clearance: 102.4 ml/min (by C-G formula based on Cr of 0.33). No results found for this basename: VANCOTROUGH, VANCOPEAK, VANCORANDOM, GENTTROUGH, GENTPEAK, GENTRANDOM, TOBRATROUGH, TOBRAPEAK, TOBRARND, AMIKACINPEAK, AMIKACINTROU, AMIKACIN,  in the last 72 hours    Assessment: [age] yoM admitted 4/4 with tachycardia and SOB, concerning for ongoing/recurrent PCP pneumonia. PMH includes recent hospitalization (3/17- 09/20/13) with newly diagnosed HIV, started on ART (Stribild), and treatment for PCP pneumonia. At discharge, he was taking Bactrim, but prematurely decreased his dosage. Pharmacy initially consulted to dose Vancomycin, Cefepime, and Bactrim. ID consult recommended repeating an entire new course for PCP PNA. 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 Since then, antiinfectives narrowed to bactrim and ganciclovir, with micafungin added for fungemia.  On 4/22 vancomycin and cefepime added as patient febrile 4/21 and continues to have diffuse BL pulmonary infiltrates and increased O2 requirements.  Vanc, Cefepime and Micafungin were discontinued and Fluconzole IV started on 4/24 for fungemia.  4/4 >> Vanc >> 4/4, 4/16 >> 18, 4/22 >>4/24 4/4 >> Cefepime >> 4/14, 4/16 >> 18, 4/22 >>4/24 4/4 >> Bactrim IV >> 4/21 4/6 >> ganciclovir >> 4/14 >> Qweek fluconazole 100mg  >> 4/18 PTA >> Qweek azithro (MAC ppx) >> 4/18 >> micafungin >>4/24 4/21>> Septra PO (ppx) >> 4/24 >> fluconazole >>   Tmax: 101.1 WBCs: 6.5 Renal: SCr 0.33, CrCl = > 16600ml/min(C-G)   CD4: 30 (4/11), 30 (3/31) HIV RNA quant: 756 (4/11), 13K (3/31)  4/4 Histoplasma Ag: neg 4/5 PCP DFA: neg (on Bactrim x2 weeks prior to collection) 4/5 Fungal: ngtd, pending, in progress x4 weeks 4/5 CMV cx: neg 4/7 fungitell (B glucan test) (-) 4/8 CMV DNA plasma: 78k  4/9 cryptococcal Ag (-) 4/11 AFB: ngtd, pending, in progress x6 weeks 4/11 resp virus panel: +parainfluenza 2 4/11 Quantiferon TB: indeterminate 4/13: CMV DNA: > 101,000 (high) 4/13 Fungus w smear (BAL): C.Albicans 4/13 fungal Ab: all neg 4/13 TB PCR: not detected 4/16 Blood: 1/2 candida albicans, 1/2 NGF 4/16 Urine: insig growth 4/20 Urine: NGF 4/20 Trach asp: flora 4/20 Blood x2: ng F 4/22 blood x 2: ng F 4/22 sputum: nl flora 4/23 C. Diff: neg 4/26 Resp culture: non pathogenic flora F 4/26 Urine: ng F 4/26 Blood x 2: ngtd 4/29 blood x2: 4/29 Trach asp:  Drug level / dose changes info:   Goal of Therapy:  Eradication of infection  Plan:   Continue Fluconazole 400mg  IV Q24H through May 4th (per ID)  F/u cultures and sensitivities   Loma BostonLaura Tyniesha Howald PharmD Pager: [phone]  10/27/2013,10:17 AM

## 2013-10-27 NOTE — Progress Notes (Signed)
PULMONARY / CRITICAL CARE MEDICINE   Name: William Bender MRN: [id number] DOB: 10-06-1965    ADMISSION DATE:  10/10/2013 CONSULTATION DATE:  10/23/2013  REFERRING MD :  EDP  CHIEF COMPLAINT:  Shortness of breath, fever, cough  BRIEF PATIENT DESCRIPTION:  48 yo with recently diagnosis of HIV/AIDS (CD4 = 30 on 3/31) admitted 4/4 with acute hypoxemic respiratory failure and bilateral pneumonia. Of note PCP negative preadmission on Bactrim Px.  SIGNIFICANT / STUDIES EVENTS: 4/04 Chest CTA:  No embolism, bilateral airspace disease 4/09 Chest CT: Pneumomediastinum, small R pneumothorax, bilateral airspace disease 4/15 RUE Venous Doppler: no DVT 4/18 Echocardiogram: EF 60%, no RWMA 4/24 Spontaneous R PTX > chest tube placed 4/24 Trach tube placed 4/26 BLE venous Dopplers: No DVT noted 4/26 BUE venous Dopplers: No DVT noted 4/29 placed back on IV dilaudid drip.PC increased to 25 and fio2 to 50% per float MD. 4/29 Hgb 6.2. One unit PRBCs transfused 4/29 William Bender conference with wife, mother and father. Plan to continue full support but not escalate much from current level. Specifically discussed was possibility of ACLS in event of cardiac arrest and HD in event of acute renal failure. It was agreed that these would be excessive interventions in his current status 4/29 started on methadone. Steroids empirically increased for non-resolving pneumonitis/ARDS. CD4 count remains < 30 4/30 better vent synchrony with methadone started 4/29. Tolerating PS 10 cm H2O   LINES / TUBES: ETT 4/5 >> 4/24 IJ CVL 4/5 >> 4/17 LUE PICC 4/17 >>  Trach tube [name]) 4/24 >>  R chest tube 4/24 >>   MICRO: HIV 1 RNA Quant  3/17: 841,324  3/31: 13,616    4/11: 756  4/27: 53 AFB  BAL 4/05 >> smear neg >> NGTD Fungal BAL 4/05 >> smear neg >> NGTD Blood 4/04 >> NEG CMV BAL 4/05 >> NEG Bact BAL 4/05 >> NEG Pneumocystis DFA 4/05 >> NEG Blood 4/11 >> NEG AFB blood cx 4/11 >> NGTD RVP 4/11 >> PARAINFLUENZA 2 Blast Abs  4/13 >> NEG Histo Abs 4/13 >> NEG Fungal BAL 4/13 >> CANDIDA ALBICANS Bact BAL 4/13 >> NEG M TB complex PCR, BAL 4/13 >> NEG CMV DNA probe (serum) 4/13 >> POSITIVE (101,862) Blood 4/16 >> 1/2 CANDIDA ALBICANS Resp 4/16 >> NEG Urine 4/16  >> NEG Resp 4/20 >> NEG Blood 4/20 >> NEG Resp 422 >> NEG Blood 4/22 >> NEG C diff 4/23 >> NEG Urine 4/26 >> NEG Resp 4/26 >> NOF Blood 4/26 >>  Resp 4/29 >>  Blood 4/29 >>    ANTIBIOTICS: (all abx per ID) HAART Vanc 4/04 >> off Cefepime 4/04 >> off Micafungin 4/17 >> 4/24 Gancyclovir 4/6 >>  Fluconazole 4/24 >>  Azithromycin 4/19 >>  TMP-SMX 4/04 >>    SUBJECTIVE:  Better vent synchrony with methadone. Tolerating PS 10 cm H2O  VITAL SIGNS: Temp:  [96.7 F (35.9 C)-101.1 F (38.4 C)] 96.7 F (35.9 C) (04/30 0800) Pulse Rate:  [58-124] 71 (04/30 0900) Resp:  [25-48] 36 (04/30 0900) BP: (98-168)/(50-95) 121/54 mmHg (04/30 0900) SpO2:  [91 %-100 %] 95 % (04/30 0900) FiO2 (%):  [50 %] 50 % (04/30 0809) Weight:  [63.4 kg (139 lb 12.4 oz)] 63.4 kg (139 lb 12.4 oz) (04/30 0645)  VENTILATOR SETTINGS: Vent Mode:  [-] CPAP FiO2 (%):  [50 %] 50 % Set Rate:  [30 bmp] 30 bmp PEEP:  [5 cmH20-8 cmH20] 5 cmH20 Pressure Support:  [10 cmH20] 10 cmH20 Plateau Pressure:  [27 cmH20-36 cmH20]  27 cmH20  INTAKE / OUTPUT: Intake/Output     04/29 0701 - 04/30 0700 04/30 0701 - 05/01 0700   I.V. (mL/kg) 670.5 (10.6) 62.4 (1)   Blood 312.5    NG/GT 1500 100   IV Piggyback 400 100   Total Intake(mL/kg) 2883 (45.5) 262.4 (4.1)   Urine (mL/kg/hr) 2150 (1.4) 130 (0.6)   Chest Tube 70 (0)    Total Output 2220 130   Net +663 +132.4         PHYSICAL EXAMINATION: Gen: vented, RASS -3, cachectic HEENT: Trach site clean, temporal wasting, NSC PULM: Mildly labored, scattered rhonchi, no air leak CV: regular, no murmur AB: BS+, soft Ext: warm no edema Neuro: MAEs  LABS: I have reviewed all of today's lab results. Relevant abnormalities are  discussed in the A/P section  CXR: Gautier ARDS pattern,  Small R apical PTX, NSC  ASSESSMENT / PLAN:  PULMONARY A:  Prolonged VDRF ARDS - suspect steroid responsive component Parainfluenza virus PNA  Pneumomediastinum, resolved Spontaneous R pneumothorax Tracheostomy status  Vent dyssynchrony P:   Vent settings reviewed and adjusted Cont vent bundle Cont chest tube on water seal Daily CXR as long as chest tube in place Increase systemic steroids 4/29 54m q 12h  CARDIOVASCULAR A:  Intermittent sinus tachycardia, reactive (fever, pain, etc) Abnormal EKG, resolved Hypertension, controlled P:  Cont to monitor BP and rhythm  RENAL A:   Hypernatremia, resolved Met alkalosis, mild P:   Monitor BMET intermittently Monitor I/Os Correct electrolytes as indicated  GASTROINTESTINAL A:   Protein calorie malnutrition P:   SUP: Pantoprazole per tube Continue TFs  HEMATOLOGIC A:   Anemia of critical illness without evidence of acute blood loss P:  DVT px: LMWH Monitor CBC intermittently Transfuse per usual ICU guidelines (Hgb < 7.0 or acute bleeding)  INFECTIOUS A:   HIV/AIDS Parainfluenza virus pneumonitis Candidemia CMV viremia Intermittent fevers P:   Micro and as above - ID service managing  ENDOCRINE A:   Steroid induced hyperglycemia P:   Cont SSI Cont Lantus  NEUROLOGIC A:    Acute encephalopathy ICU associated discomfort P:   Goal RASS 0 to -1 DC Fentanyl patch 4/30 Cont methadone - started 4/29 Cont clonazepam at current dose Cont quetiapine @ current dose Cont clonidine patch as adjunctive therapy Added methadone 4/29 Cont PRN lorazepam and hydromorphone   Global: Agitation is better controlled with methadone. Wean as tolerated. Dc Duragesic 4/30. Mother updated @ bedside  [facility] ACNP LMaryanna ShapePCCM Pager [phone] 3 pm If no answer page [phone]/30/2015, 10:20 AM  PCCM ATTENDING: I have interviewed and examined the  patient and reviewed the database. I have formulated the assessment and plan as reflected in the note above with amendments made by me. 35 mins of direct critical care time provided  DMerton Border MD;  PCCM service; Mobile ([phone]

## 2013-10-27 NOTE — Progress Notes (Addendum)
Patient ID: William Bender, male   DOB: 06/10/1966, 48 y.o.   MRN: [id number]         [facility]    Date of Admission:  10/13/2013           Day 25 ganciclovir        Day 13 antifungal therapy Principal Problem:   Pneumonia Active Problems:   Acute respiratory failure   AIDS   PCP (pneumocystis carinii pneumonia)   CMV (cytomegalovirus)   Sacral decubitus ulcer, stage II   Hyponatremia   Pneumomediastinum   Fungemia   Spontaneous pneumothorax   Pneumothorax on right   . antiseptic oral rinse  15 mL Mouth Rinse QID  . azithromycin  1,200 mg Per Tube Weekly  . chlorhexidine  15 mL Mouth Rinse BID  . clonazepam  2 mg Per Tube BID  . cloNIDine  0.1 mg Transdermal Weekly  . collagenase   Topical Daily  . dolutegravir  50 mg Oral Daily  . emtricitabine-tenofovir  1 tablet Per Tube Daily  . enoxaparin (LOVENOX) injection  40 mg Subcutaneous Daily  . etomidate  40 mg Intravenous Once  . [START ON 10/28/2013] feeding supplement (OXEPA)  1,000 mL Per Tube Q24H  . feeding supplement (PRO-STAT SUGAR FREE 64)  30 mL Per Tube QID  . fluconazole (DIFLUCAN) IV  400 mg Intravenous Q24H  . free water  200 mL Per Tube Q4H  . ganciclovir (CYTOVENE) IV  5 mg/kg Intravenous Q12H  . insulin aspart  0-15 Units Subcutaneous 6 times per day  . insulin glargine  25 Units Subcutaneous Daily  . methadone  20 mg Per Tube 3 times per day  . methylPREDNISolone (SOLU-MEDROL) injection  80 mg Intravenous Q12H  . metoprolol tartrate  25 mg Per Tube BID  . multivitamin  5 mL Per Tube Daily  . pantoprazole sodium  40 mg Per Tube Daily  . QUEtiapine  200 mg Per Tube BID  . sodium chloride  10-40 mL Intracatheter Q12H  . sulfamethoxazole-trimethoprim  1 tablet Per Tube Daily    Objective: Temp:  [96.7 F (35.9 C)-98.7 F (37.1 C)] 97.8 F (36.6 C) (04/30 1600) Pulse Rate:  [58-124] 68 (04/30 1000) Resp:  [24-48] 24 (04/30 1000) BP: (98-168)/(50-95) 115/52 mmHg (04/30  1000) SpO2:  [91 %-100 %] 97 % (04/30 1000) FiO2 (%):  [40 %-50 %] 50 % (04/30 1617) Weight:  [139 lb 12.4 oz (63.4 kg)] 139 lb 12.4 oz (63.4 kg) (04/30 0645)  General: He is resting quietly on the ventilator. Skin: No rash. Sacral decubitus has gotten slightly worse over the past week but is without signs of infection Lungs: Clear anteriorly Cor: Regular S1-S2 no murmurs Abdomen: Soft and nontender. Diarrhea is improved recently   Lab Results Lab Results  Component Value Date   WBC 6.5 10/27/2013   HGB 7.4* 10/27/2013   HCT 23.8* 10/27/2013   MCV 94.4 10/27/2013   PLT 170 10/27/2013    Lab Results  Component Value Date   CREATININE 0.33* 10/27/2013   BUN 19 10/27/2013   NA 141 10/27/2013   K 4.6 10/27/2013   CL 104 10/27/2013   CO2 30 10/27/2013    Lab Results  Component Value Date   ALT 143* 10/26/2013   AST 40* 10/26/2013   ALKPHOS 74 10/26/2013   BILITOT 0.3 10/26/2013      HIV 1 RNA Quant (copies/mL)  Date Value  10/24/2013 53*  10/08/2013 756*  09/27/2013 13616*  CD4 T Cell Abs (/uL)  Date Value  10/24/2013 30*  10/08/2013 30*  09/27/2013 30*   Microbiology: Recent Results (from the past 240 hour(s))  URINE CULTURE     Status: None   Collection Time    10/17/13  4:59 PM      Result Value Ref Range Status   Specimen Description URINE, CATHETERIZED   Final   Special Requests Normal   Final   Culture  Setup Time     Final   Value: 10/18/2013 02:13     [facility]formed [facility]   Colony Count     Final   Value: NO GROWTH     [facility]   Culture     Final   Value: NO GROWTH     [facility]formed at [facility]   Report Status 10/18/2013 FINAL   Final  CULTURE, BLOOD (ROUTINE X 2)     Status: None   Collection Time    10/17/13  5:00 PM      Result Value Ref Range Status   Specimen Description BLOOD RIGHT RIGHT ANTECUBITAL   Final   Special Requests BOTTLES DRAWN AEROBIC AND ANAEROBIC 10CC   Final   Culture  Setup Time     Final    Value: 10/17/2013 18:56     [facility]formed at [facility]   Culture     Final   Value: NO GROWTH 5 DAYS     [facility]formed at [facility]   Report Status 10/23/2013 FINAL   Final  CULTURE, RESPIRATORY (NON-EXPECTORATED)     Status: None   Collection Time    10/17/13  5:00 PM      Result Value Ref Range Status   Specimen Description TRACHEAL ASPIRATE   Final   Special Requests NONE   Final   Gram Stain     Final   Value: MODERATE WBC PRESENT, PREDOMINANTLY PMN     NO SQUAMOUS EPITHELIAL CELLS SEEN     NO ORGANISMS SEEN     Performed at [facility]   Culture     Final   Value: Non-Pathogenic Oropharyngeal-type Flora Isolated.     Performed at [facility]   Report Status 10/19/2013 FINAL   Final  CULTURE, BLOOD (ROUTINE X 2)     Status: None   Collection Time    10/17/13  5:05 PM      Result Value Ref Range Status   Specimen Description BLOOD RIGHT ARM   Final   Special Requests BOTTLES DRAWN AEROBIC AND ANAEROBIC 10CC   Final   Culture  Setup Time     Final   Value: 10/17/2013 18:56     Performed at [facility]   Culture     Final   Value: NO GROWTH 5 DAYS     Performed at [facility]   Report Status 10/23/2013 FINAL   Final  CULTURE, RESPIRATORY (NON-EXPECTORATED)     Status: None   Collection Time    10/19/13  5:53 PM      Result Value Ref Range Status   Specimen Description TRACHEAL ASPIRATE   Final   Special Requests NONE   Final   Gram Stain     Final   Value: ABUNDANT WBC PRESENT,BOTH PMN AND MONONUCLEAR     NO SQUAMOUS EPITHELIAL CELLS SEEN     NO ORGANISMS SEEN     [facility]formed at [facility]   Culture  Final   Value: Non-Pathogenic Oropharyngeal-type Flora Isolated.     Performed at [facility]   Report Status 10/22/2013 FINAL   Final  CULTURE, BLOOD (ROUTINE X 2)     Status: None   Collection Time    10/19/13  5:55 PM      Result Value Ref Range Status   Specimen Description BLOOD RIGHT ARM    Final   Special Requests BOTTLES DRAWN AEROBIC AND ANAEROBIC 8CC   Final   Culture  Setup Time     Final   Value: 10/19/2013 21:55     Performed at [facility]   Culture     Final   Value: NO GROWTH 5 DAYS     Performed at [facility]   Report Status 10/25/2013 FINAL   Final  CULTURE, BLOOD (ROUTINE X 2)     Status: None   Collection Time    10/19/13  6:00 PM      Result Value Ref Range Status   Specimen Description BLOOD RIGHT ARM   Final   Special Requests BOTTLES DRAWN AEROBIC ONLY 2CC   Final   Culture  Setup Time     Final   Value: 10/19/2013 21:55     Performed at [facility]   Culture     Final   Value: NO GROWTH 5 DAYS     Performed at [facility]   Report Status 10/25/2013 FINAL   Final  CLOSTRIDIUM DIFFICILE BY PCR     Status: None   Collection Time    10/20/13  6:10 AM      Result Value Ref Range Status   C difficile by pcr NEGATIVE  NEGATIVE Final   Comment: Performed at [facility]  CULTURE, RESPIRATORY (NON-EXPECTORATED)     Status: None   Collection Time    10/23/13 11:40 AM      Result Value Ref Range Status   Specimen Description TRACHEAL ASPIRATE   Final   Special Requests Immunocompromised   Final   Gram Stain     Final   Value: RARE WBC PRESENT,BOTH PMN AND MONONUCLEAR     RARE SQUAMOUS EPITHELIAL CELLS PRESENT     MODERATE GRAM POSITIVE COCCI     IN PAIRS FEW GRAM NEGATIVE RODS     RARE GRAM POSITIVE RODS   Culture     Final   Value: Non-Pathogenic Oropharyngeal-type Flora Isolated.     Performed at [facility]   Report Status 10/25/2013 FINAL   Final  CULTURE, BLOOD (ROUTINE X 2)     Status: None   Collection Time    10/23/13 11:55 AM      Result Value Ref Range Status   Specimen Description BLOOD RIGHT ARM   Final   Special Requests     Final   Value: BOTTLES DRAWN AEROBIC AND ANAEROBIC 4CC BOTH BOTTLES   Culture  Setup Time     Final   Value: 10/23/2013 18:28     Performed at [facility]   Culture     Final   Value:        BLOOD CULTURE RECEIVED NO GROWTH TO DATE CULTURE WILL BE HELD FOR 5 DAYS BEFORE ISSUING A FINAL NEGATIVE REPORT     Performed at [facility]   Report Status PENDING   Incomplete  URINE CULTURE     Status: None   Collection Time    10/23/13 12:02 PM  Result Value Ref Range Status   Specimen Description URINE, CATHETERIZED   Final   Special Requests Immunocompromised   Final   Culture  Setup Time     Final   Value: 10/23/2013 20:38     Performed at [facility]   Colony Count     Final   Value: NO GROWTH     Performed at [facility]   Culture     Final   Value: NO GROWTH     Performed at [facility]   Report Status 10/24/2013 FINAL   Final  CULTURE, RESPIRATORY (NON-EXPECTORATED)     Status: None   Collection Time    10/26/13  5:30 PM      Result Value Ref Range Status   Specimen Description TRACHEAL ASPIRATE   Final   Special Requests NONE   Final   Gram Stain     Final   Value: MODERATE WBC PRESENT,BOTH PMN AND MONONUCLEAR     RARE SQUAMOUS EPITHELIAL CELLS PRESENT     ABUNDANT GRAM POSITIVE COCCI IN PAIRS     ABUNDANT GRAM NEGATIVE RODS     Performed at [facility]   Culture PENDING   Incomplete   Report Status PENDING   Incomplete    Studies/Results: Dg Chest Port 1 View  10/27/2013   CLINICAL DATA:  Pneumothorax  EXAM: PORTABLE CHEST - 1 VIEW  COMPARISON:  DG CHEST 1V PORT dated 10/26/2013; DG CHEST 1V PORT dated 10/24/2013; DG CHEST 1V PORT dated 10/21/2013; DG CHEST 1V PORT dated 10/19/2013  FINDINGS: Grossly unchanged cardiac silhouette and mediastinal contours. Stable positioning of support apparatus with unchanged very tiny right apical pneumothorax. Extensive diffuse though mid and lower lung predominant heterogeneous airspace opacities are unchanged. No new focal airspace opacities. There is persistent mild elevation of the right hemidiaphragm. No definite pleural effusion.  Unchanged bones. The amount of subcutaneous emphysema about the right lateral chest wall is grossly unchanged.  IMPRESSION: 1. Stable positioning of support apparatus. Unchanged tiny right apical pneumothorax. 2. Grossly unchanged extensive bilateral airspace opacities with broad differential considerations though favored to represent multifocal infection (including atypical etiologies) and/or ARDS.   Electronically Signed   By: Simonne Come M.D.   On: 10/27/2013 07:43   Dg Chest Port 1 View  10/26/2013   CLINICAL DATA:  Respiratory failure.  EXAM: PORTABLE CHEST - 1 VIEW  COMPARISON:  DG CHEST 1V PORT dated 10/25/2013  FINDINGS: Tracheostomy tube, feeding tube, right chest tube, left PICC line in stable position. Stable cardiomegaly. Diffuse unchanged dense bilateral pulmonary infiltrates. Tiny right apical pneumothorax cannot be excluded. Subtle right supraclavicular subcutaneous emphysema cannot be excluded. Mild subcutaneous emphysema noted along the right chest wall.  IMPRESSION: 1. Line and tube positions stable. 2. Tiny right apical pneumothorax again cannot be excluded. No progression. 3. Dense unchanged bilateral pulmonary infiltrates. 4. Stable severe cardiomegaly.   Electronically Signed   By: Maisie Fus  Register   On: 10/26/2013 07:27    Assessment: Nocholas has recently diagnosed HIV infection complicated by multifactorial pneumonia and ventilator dependent respiratory failure. He has had back-to-back hospitalizations. He was treated presumptively during his initial hospitalization for pneumocystis. He worsened after discharged leading to readmission. He completed a second round of therapy for possible pneumocystis. He was found to have CMV viremia and BAL fluid was PCR positive for CMV. He is now been on 25 days of ganciclovir. He was also PCR positive for parainfluenza. He has had intermittent fevers leading  to intermittent use of antibacterial agents for possible HCAP. He did have one blood culture  positive for Candida albicans and has been on fluconazole. Repeat blood cultures have been negative. Two weeks ago we began to wean his steroids and an attempt to improve his immune response to CMV and parainfluenza. He had a fairly abrupt increase in ventilatory requirements. Steroids were increased again last week and he has shown some improvement. He probably now has ARDS.  He has already achieved a fairly good viral suppression on antiretroviral therapy but his CD4 count remains quite low. This does not fit the pattern of immune reconstitution inflammatory syndrome. I will stop his ganciclovir today and continue fluconazole through May 4 to complete 2 weeks after first negative blood culture. He continues to have intermittent fevers. Blood and sputum cultures were repeated yesterday and are pending.  Plan: 1. Continue full dose fluconazole through May 4 then convert to a once weekly prophylactic dose  2. Discontinue ganciclovir 3. Await results of repeat blood and sputum cultures 4. Continue current antiretroviral therapy and prophylaxis for pneumocystis Mycobacterium avium 5. I will discontinue contact precautions for parainfluenza now  Cliffton Asters, MD [facility] [phone] pager   [phone] cell 10/27/2013, 4:37 PM

## 2013-10-27 NOTE — Progress Notes (Signed)
Decreased Fi02 to 40% and decreased precedex to . . Pt experiened increased WOB and RR of 35. Sats decreased to 87-89%. Precedex resumed at . and Fi02 resumed at 50%. Will attempt to decrease again later today.

## 2013-10-27 NOTE — Progress Notes (Signed)
NUTRITION FOLLOW UP  Intervention:   - Change TF via NGT of Oxepa to 36m/hr and increase Prostat 375mto QID. This will provide 2020 calories, 128g protein, and 84810mree water and meet 105% estimated calorie needs and 100% estimated protein needs. Getting 200m69mter flushes 6 times/day via NGT which will provide a total of 2048ml60mer.  - Will continue to monitor   Nutrition Dx:   Inadequate oral intake related to inability to eat as evidenced by NPO - ongoing   Goal:   TF regimen to meet >/= 90% of their estimated nutrition needs - met  Monitor:   Weights, labs, TF tolerance, vent status   Assessment:   47 y/24male recently diagnosed with HIV and treated empirically for PCP [facility] with acute hypoxemic respiratory failure.   4/5 - Pt intubated 4/5. Per family, pt has lost ~15-20 lbs over the past few weeks. He is using nutritional supplements at home (Boost or Ensure.). Received consult for TF management, RD ordered Vital AF 1.2 @ 20 ml/hr via OG tube and increase by 10 ml every 4 hours to goal rate of 80 ml/hr. At goal rate, tube feeding regimen will provide 2304 kcal, 103 grams of protein, and 1549 ml of H2O. Goal rate meets 100% of estimated calorie and protein needs.   4/7 - Pt started on ARDS protocol 4/6. Per RN, pt tolerating Vital AF 1.2 at goal rate of 80ml/54mith 30ml o81msiduals this morning and 30-40ml re63mals overnight. Remains intubated.   4/10 - Patient remains intubated.  Receiving Oxepa at 20 ml/hr plus prostat and tolerating well. Propofol: 27.5 ml/hr provides 726 calories continues  4/13 - Remains intubated. Receiving Oxepa at 30ml/hr 35m Prosat 30ml QID 57movides 1480 calories, 105g protein. TF plus current calories from Propofol provide 2327 calories meeting 124% estimated calorie needs and 100% estimated protein needs. Paralytics added 4/10. Needed ET tube changed today which was performed after emergency bronchoscopy. Per RN, pt  tolerating TF well without any residuals.   4/17 - Remains intubated. Receiving Oxepa to 20 ml/hr as pt getting high rate of Propfol. Oxepa at 20ml/hr wi49mrostat 30ml QID wi53malories from current Propfol rate will provide 1967 calories, 90g protein, and 377ml free wa39mand meet 105% estimated calorie needs and 86% estimated protein needs. Per RN, pt tolerating TF with no residuals.   4/20 - Remains intubated. Receiving Oxepa to 20 ml/hr as pt getting high rate of Propfol. Oxepa at 20ml/hr with 4mtat 30ml QID with 60mries from current Propfol rate will provide 1967 calories, 90g protein, and 377ml free water36m meet 105% of previously estimated calorie needs and 86% estimated protein needs. Per conversation with RN, only 10ml of TF resid58m this morning. D/C Prostat and Oxepa. Start TF via OGT of Vital High Protein at 20ml/hr increase 15m0ml every 4 hours1mgoal of 60ml/hr. This will 31mide 1440 calories, 126g protein, 1203 ml water. Goal rate plus calories from Propofol will provide 2348 calories and meet 101% estimated calorie needs and 101% estimated protein needs. If IVF d/c, recommend 200ml water flushes 612mes/day. Changed TF rate as pt getting so little of Oxepa for TF to be beneficial.   4/21 - Remains intubated. Receiving Vital High Protein at 50ml/hr which provide20m00 calories, 105g protein, and 1003ml free water which 68ms 52% of previously estimated calorie needs and 100% estimated protein needs. Continues to have severe ARDS per RN. 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 RN reports pt tolerating TF well without any residuals. Will change TF back to Oxepa for pt to get specialized nutrition now that pt no longer on Propofol. Ordered Oxepa at 75m/hr increase by 122mevery 4 hours to goal of 6015mr with Prostat 25m70mily  4/23 - Remains intubated. Per RN, pt tolerating TF well without any residuals, at goal rate. RN states they are awaiting family  decision on terminal wean.   4/27 - Extubated 4/24 then had tracheostomy placed. Chest tube placed 4/24 for pneumothorax. On full vent support but weaning trial in progress. Per RN, pt with 70-100ml25mTF residual today. Per conversation with RN, plan is for goals of care meeting on Wednesday. Sodium elevated, free water flushes doubled yesterday.  4/30 - Had marked increase work of breathing early this morning on ventilator. Continues to get TF via NGT since pt had tracheostomy. Per conversation with RN, pt with <25ml 65mesiduals today.   TF: Oxepa at 50ml/h80mth Prostat 25ml BI85mprovides 2000 calories, 105g protein, 942ml fre56mter and meets 104% estimated calorie needs, 100% estimated protein needs   Patient is currently intubated on ventilator support MV: 13.1 L/min Temp (24hrs), Avg:97.8 F (36.6 C), Min:96.7 F (35.9 C), Max:98.7 F (37.1 C)  Propofol: off    Height: Ht Readings from Last 1 Encounters:  10/21/13 5' 10"  (1.778 m)    Weight Status:   Wt Readings from Last 1 Encounters:  10/27/13 139 lb 12.4 oz (63.4 kg)  Admit wt:        155 lb (70.3 kg) Net I/Os: +6.9L  Re-estimated needs:  Kcal: 1914 Protein: 115-130g Fluid: per MD  Skin: +1 Generalized edema, non-pitting RUE, LUE, LLE, RLE edema, stage 2 sacral pressure ulcer on buttocks    Diet Order:  NPO   Intake/Output Summary (Last 24 hours) at 10/27/13 1500 Last data filed at 10/27/13 1258  Gross per 24 hour  Intake 1644.25 ml  Output   1955 ml  Net -310.75 ml    Last BM: 4/28   Labs:   Recent Labs Lab 10/21/13 0430 10/22/13 0130  10/25/13 0400 10/26/13 0600 10/27/13 0455  NA 142 149*  < > 146 139 141  K 3.4* 3.8  < > 4.0 4.1 4.6  CL 93* 104  < > 106 100 104  CO2 37* 37*  < > 33* 32 30  BUN 64* 69*  < > 30* 20 19  CREATININE 1.01 1.11  < > 0.42* 0.40* 0.33*  CALCIUM 8.0* 8.5  < > 8.2* 8.0* 7.9*  MG 2.9* 2.9*  --   --   --   --   PHOS 5.0* 5.2*  --   --   --   --   GLUCOSE 226*  228*  < > 161* 127* 164*  < > = values in this interval not displayed.  CBG (last 3)   Recent Labs  10/27/13 0337 10/27/13 0810 10/27/13 1159  GLUCAP 144* 152* 190*    Scheduled Meds: . antiseptic oral rinse  15 mL Mouth Rinse QID  . azithromycin  1,200 mg Per Tube Weekly  . chlorhexidine  15 mL Mouth Rinse BID  . clonazepam  2 mg Per Tube BID  . cloNIDine  0.1 mg Transdermal Weekly  . collagenase   Topical Daily  . dolutegravir  50 mg Oral Daily  . emtricitabine-tenofovir  1 tablet Per Tube Daily  . enoxaparin (LOVENOX)  injection  40 mg Subcutaneous Daily  . etomidate  40 mg Intravenous Once  . feeding supplement (PRO-STAT SUGAR FREE 64)  30 mL Per Tube BID  . fluconazole (DIFLUCAN) IV  400 mg Intravenous Q24H  . free water  200 mL Per Tube Q4H  . ganciclovir (CYTOVENE) IV  5 mg/kg Intravenous Q12H  . insulin aspart  0-15 Units Subcutaneous 6 times per day  . insulin glargine  25 Units Subcutaneous Daily  . methadone  20 mg Per Tube 3 times per day  . methylPREDNISolone (SOLU-MEDROL) injection  80 mg Intravenous Q12H  . metoprolol tartrate  25 mg Per Tube BID  . multivitamin  5 mL Per Tube Daily  . pantoprazole sodium  40 mg Per Tube Daily  . QUEtiapine  200 mg Per Tube BID  . sodium chloride  10-40 mL Intracatheter Q12H  . sulfamethoxazole-trimethoprim  1 tablet Per Tube Daily    Continuous Infusions: . dexmedetomidine 0.6 mcg/kg/hr (10/27/13 1405)  . feeding supplement (OXEPA) 1,000 mL (10/27/13 0800)     Mikey College MS, RD, LDN [phone] Pager [phone] After Hours Pager

## 2013-10-27 NOTE — Consult Note (Signed)
WOC wound consult note Reason for Consult: right Sacral pressure ulcer and anal ulcer, pressure and MASD (moisture associated skin damage) Wound type: Pressure ulcer Pressure Ulcer POA: Yes Measurement: sacrum 2 cm x 2 cm unstageable, 100% soft, brown eschar.   Anal lesion 1 cm x 2 cm x 0.1 cm Wound bed: sacrum lesion 100% eschar Drainage (amount, consistency, odor) minimal serosanguinous drainage.  Periwound: intact Dressing procedure/placement/frequency: Will begin Santyl to twice daily for debridement sacral ulcer.  No change in care to anal ulcer.  Continue to turn/reposition every 2 hours..  Will not follow at this time.  Please re-consult if needed.  Maple HudsonKaren Carlie Corpus RN BSN CWON Pager [phone]

## 2013-10-27 NOTE — Progress Notes (Signed)
[id number]/Rhonda Davis, RN, BSN, CCM  [phone]  Chart Reviewed for discharge and hospital needs.  Discharge needs at time of review: None present will follow for needs.  Review of patient progress due on [id number]. 

## 2013-10-28 ENCOUNTER — Inpatient Hospital Stay (HOSPITAL_COMMUNITY): Payer: BC Managed Care – PPO

## 2013-10-28 LAB — CBC
HCT: 24.9 % — ABNORMAL LOW (ref 39.0–52.0)
HEMOGLOBIN: 7.9 g/dL — AB (ref 13.0–17.0)
MCH: 29.7 pg (ref 26.0–34.0)
MCHC: 31.7 g/dL (ref 30.0–36.0)
MCV: 93.6 fL (ref 78.0–100.0)
PLATELETS: 231 10*3/uL (ref 150–400)
RBC: 2.66 MIL/uL — AB (ref 4.22–5.81)
RDW: 18.4 % — ABNORMAL HIGH (ref 11.5–15.5)
WBC: 9.9 10*3/uL (ref 4.0–10.5)

## 2013-10-28 LAB — GLUCOSE, CAPILLARY
GLUCOSE-CAPILLARY: 146 mg/dL — AB (ref 70–99)
GLUCOSE-CAPILLARY: 115 mg/dL — AB (ref 70–99)
GLUCOSE-CAPILLARY: 155 mg/dL — AB (ref 70–99)
Glucose-Capillary: 157 mg/dL — ABNORMAL HIGH (ref 70–99)
Glucose-Capillary: 140 mg/dL — ABNORMAL HIGH (ref 70–99)
Glucose-Capillary: 124 mg/dL — ABNORMAL HIGH (ref 70–99)
Glucose-Capillary: 136 mg/dL — ABNORMAL HIGH (ref 70–99)

## 2013-10-28 LAB — CLOSTRIDIUM DIFFICILE BY PCR: Toxigenic C. Difficile by PCR: NEGATIVE

## 2013-10-28 LAB — BASIC METABOLIC PANEL
BUN: 28 mg/dL — AB (ref 6–23)
CHLORIDE: 102 meq/L (ref 96–112)
CO2: 35 mEq/L — ABNORMAL HIGH (ref 19–32)
CREATININE: 0.36 mg/dL — AB (ref 0.50–1.35)
Calcium: 8.3 mg/dL — ABNORMAL LOW (ref 8.4–10.5)
Glucose, Bld: 147 mg/dL — ABNORMAL HIGH (ref 70–99)
Potassium: 4.3 mEq/L (ref 3.7–5.3)
Sodium: 142 mEq/L (ref 137–147)

## 2013-10-28 MED ORDER — VALPROIC ACID 250 MG/5ML PO SYRP
500.0000 mg | ORAL_SOLUTION | Freq: Two times a day (BID) | ORAL | Status: DC
  Administered 2013-10-28 – 2013-10-29 (×4): 500 mg
  Filled 2013-10-28 (×6): qty 10

## 2013-10-28 MED ORDER — METHADONE HCL 10 MG/ML PO CONC: 30.0000 mg | Freq: Three times a day (TID) | ORAL | Status: DC

## 2013-10-28 MED ORDER — ALTEPLASE 2 MG IJ SOLR
2.0000 mg | Freq: Once | INTRAMUSCULAR | Status: AC
  Administered 2013-10-28: 2 mg
  Filled 2013-10-28: qty 2

## 2013-10-28 MED ORDER — METHADONE HCL 10 MG/ML PO CONC
20.0000 mg | Freq: Three times a day (TID) | ORAL | Status: DC
  Administered 2013-10-28 – 2013-11-01 (×12): 20 mg
  Filled 2013-10-28 (×12): qty 2

## 2013-10-28 NOTE — Progress Notes (Addendum)
[facility]  Date of Admission:  10/21/2013  Antibiotics: Ganciclovir 25 days Fluconazole day 14  Subjective: Remains intubated and sedated  Objective: Temp:  [97.6 F (36.4 C)-98.3 F (36.8 C)] 98.3 F (36.8 C) (05/01 0800) Pulse Rate:  [61-128] 97 (05/01 1200) Resp:  [16-59] 21 (05/01 1200) BP: (113-178)/(50-109) 142/91 mmHg (05/01 1200) SpO2:  [92 %-100 %] 99 % (05/01 1200) FiO2 (%):  [50 %] 50 % (05/01 1148) Weight:  [142 lb 13.7 oz (64.8 kg)] 142 lb 13.7 oz (64.8 kg) (05/01 0400)  General: currently being bathed, no response, on ventilator Skin: no rash, + sacral decubitus ulcer Lungs: CTA B, no wheezes noted, anterior exam Cor: tach rr, no murmurs Abdomen: soft, nt, nd Ext: no edema  Lab Results Lab Results  Component Value Date   WBC 9.9 10/28/2013   HGB 7.9* 10/28/2013   HCT 24.9* 10/28/2013   MCV 93.6 10/28/2013   PLT 231 10/28/2013    Lab Results  Component Value Date   CREATININE 0.36* 10/28/2013   BUN 28* 10/28/2013   NA 142 10/28/2013   K 4.3 10/28/2013   CL 102 10/28/2013   CO2 35* 10/28/2013    Lab Results  Component Value Date   ALT 143* 10/26/2013   AST 40* 10/26/2013   ALKPHOS 74 10/26/2013   BILITOT 0.3 10/26/2013      Microbiology: Recent Results (from the past 240 hour(s))  CULTURE, RESPIRATORY (NON-EXPECTORATED)     Status: None   Collection Time    10/19/13  5:53 PM      Result Value Ref Range Status   Specimen Description TRACHEAL ASPIRATE   Final   Special Requests NONE   Final   Gram Stain     Final   Value: ABUNDANT WBC PRESENT,BOTH PMN AND MONONUCLEAR     NO SQUAMOUS EPITHELIAL CELLS SEEN     NO ORGANISMS SEEN     Performed at [facility]   Culture     Final   Value: Non-Pathogenic Oropharyngeal-type Flora Isolated.     Performed at [facility]   Report Status 10/22/2013 FINAL   Final  CULTURE, BLOOD (ROUTINE X 2)     Status: None   Collection Time    10/19/13  5:55 PM      Result Value Ref  Range Status   Specimen Description BLOOD RIGHT ARM   Final   Special Requests BOTTLES DRAWN AEROBIC AND ANAEROBIC 8CC   Final   Culture  Setup Time     Final   Value: 10/19/2013 21:55     Performed at [facility]   Culture     Final   Value: NO GROWTH 5 DAYS     Performed at [facility]   Report Status 10/25/2013 FINAL   Final  CULTURE, BLOOD (ROUTINE X 2)     Status: None   Collection Time    10/19/13  6:00 PM      Result Value Ref Range Status   Specimen Description BLOOD RIGHT ARM   Final   Special Requests BOTTLES DRAWN AEROBIC ONLY 2CC   Final   Culture  Setup Time     Final   Value: 10/19/2013 21:55     Performed at [facility]   Culture     Final   Value: NO GROWTH 5 DAYS     Performed at [facility]   Report Status 10/25/2013 FINAL   Final  CLOSTRIDIUM DIFFICILE BY PCR     Status: None   Collection Time    10/20/13  6:10 AM      Result Value Ref Range Status   C difficile by pcr NEGATIVE  NEGATIVE Final   Comment: [facility]  CULTURE, RESPIRATORY (NON-EXPECTORATED)     Status: None   Collection Time    10/23/13 11:40 AM      Result Value Ref Range Status   Specimen Description TRACHEAL ASPIRATE   Final   Special Requests Immunocompromised   Final   Gram Stain     Final   Value: RARE WBC PRESENT,BOTH PMN AND MONONUCLEAR     RARE SQUAMOUS EPITHELIAL CELLS PRESENT     MODERATE GRAM POSITIVE COCCI     IN PAIRS FEW GRAM NEGATIVE RODS     RARE GRAM POSITIVE RODS   Culture     Final   Value: Non-Pathogenic Oropharyngeal-type Flora Isolated.     [facility]   Report Status 10/25/2013 FINAL   Final  CULTURE, BLOOD (ROUTINE X 2)     Status: None   Collection Time    10/23/13 11:55 AM      Result Value Ref Range Status   Specimen Description BLOOD RIGHT ARM   Final   Special Requests     Final   Value: BOTTLES DRAWN AEROBIC AND ANAEROBIC 4CC BOTH BOTTLES   Culture  Setup Time     Final    Value: 10/23/2013 18:28     [facility]   Culture     Final   Value:        BLOOD CULTURE RECEIVED NO GROWTH TO DATE CULTURE WILL BE HELD FOR 5 DAYS BEFORE ISSUING A FINAL NEGATIVE REPORT     [facility]   Report Status PENDING   Incomplete  URINE CULTURE     Status: None   Collection Time    10/23/13 12:02 PM      Result Value Ref Range Status   Specimen Description URINE, CATHETERIZED   Final   Special Requests Immunocompromised   Final   Culture  Setup Time     Final   Value: 10/23/2013 20:38     [facility]   Colony Count     Final   Value: NO GROWTH     [facility]   Culture     Final   Value: NO GROWTH     [facility]   Report Status 10/24/2013 FINAL   Final  CULTURE, RESPIRATORY (NON-EXPECTORATED)     Status: None   Collection Time    10/26/13  5:30 PM      Result Value Ref Range Status   Specimen Description TRACHEAL ASPIRATE   Final   Special Requests NONE   Final   Gram Stain     Final   Value: MODERATE WBC PRESENT,BOTH PMN AND MONONUCLEAR     RARE SQUAMOUS EPITHELIAL CELLS PRESENT     ABUNDANT GRAM POSITIVE COCCI IN PAIRS     ABUNDANT GRAM NEGATIVE RODS     [facility]   Culture     Final   Value: Non-Pathogenic Oropharyngeal-type Flora Isolated.     [facility]formed at [facility]   Report Status PENDING   Incomplete  CULTURE, BLOOD (ROUTINE X 2)     Status: None   Collection Time  10/26/13  6:25 PM      Result Value Ref Range Status   Specimen Description BLOOD RIGHT ARM   Final   Special Requests BOTTLES DRAWN AEROBIC AND ANAEROBIC 5CC   Final   Culture  Setup Time     Final   Value: 10/27/2013 01:59     Performed at [facility]   Culture     Final   Value:        BLOOD CULTURE RECEIVED NO GROWTH TO DATE CULTURE WILL BE HELD FOR 5 DAYS BEFORE ISSUING A FINAL NEGATIVE REPORT     Performed at [facility]     Report Status PENDING   Incomplete  CULTURE, BLOOD (ROUTINE X 2)     Status: None   Collection Time    10/26/13  6:31 PM      Result Value Ref Range Status   Specimen Description BLOOD RIGHT HAND   Final   Special Requests BOTTLES DRAWN AEROBIC ONLY 3CC   Final   Culture  Setup Time     Final   Value: 10/27/2013 01:59     Performed at [facility]   Culture     Final   Value:        BLOOD CULTURE RECEIVED NO GROWTH TO DATE CULTURE WILL BE HELD FOR 5 DAYS BEFORE ISSUING A FINAL NEGATIVE REPORT     Performed at [facility]   Report Status PENDING   Incomplete    Studies/Results: Dg Chest Port 1 View  10/28/2013   CLINICAL DATA:  Chest tube.  Pneumothorax.  EXAM: PORTABLE CHEST - 1 VIEW  COMPARISON:  10/27/2013  FINDINGS: Tracheostomy tube overlies the airway. Enteric tube courses towards the left upper abdomen. Right-sided chest tube remains in place, unchanged. Left PICC remains in place with tip overlying the lower SVC. Diffuse bilateral airspace opacities do not appear significantly changed. Elevation of the right hemidiaphragm is unchanged. Tiny right apical pneumothorax on the prior study is no longer clearly identified on the current examination. Subcutaneous emphysema remains in the right chest wall.  IMPRESSION: 1. Stable position of support apparatus. No definite residual pneumothorax identified. 2. Unchanged, diffuse bilateral airspace opacities.   Electronically Signed   By: Sebastian Ache   On: 10/28/2013 07:55   Dg Chest Port 1 View  10/27/2013   CLINICAL DATA:  Pneumothorax  EXAM: PORTABLE CHEST - 1 VIEW  COMPARISON:  DG CHEST 1V PORT dated 10/26/2013; DG CHEST 1V PORT dated 10/24/2013; DG CHEST 1V PORT dated 10/21/2013; DG CHEST 1V PORT dated 10/19/2013  FINDINGS: Grossly unchanged cardiac silhouette and mediastinal contours. Stable positioning of support apparatus with unchanged very tiny right apical pneumothorax. Extensive diffuse though mid and lower lung  predominant heterogeneous airspace opacities are unchanged. No new focal airspace opacities. There is persistent mild elevation of the right hemidiaphragm. No definite pleural effusion. Unchanged bones. The amount of subcutaneous emphysema about the right lateral chest wall is grossly unchanged.  IMPRESSION: 1. Stable positioning of support apparatus. Unchanged tiny right apical pneumothorax. 2. Grossly unchanged extensive bilateral airspace opacities with broad differential considerations though favored to represent multifocal infection (including atypical etiologies) and/or ARDS.   Electronically Signed   By: Simonne Come M.D.   On: 10/27/2013 07:43    Assessment/Plan: 1) Respiratory failure - multifactorial, steroid dependent at this time.  Has had positive parainfluenza.  No new positive cultures.  Respiratory culture not pathogenic organisms.  Some element of ARDS.   No  changes.   Family not available  2) HIV/AIDS - [place], [place].  CD4 remains low, viral load suppressed.   3) Candidemia - + albicans.  On fluconazole through May 4th then weekly prophylaxis.    Dr. Orvan Falconerampbell will be available over the weekend if needed  Gardiner Barefootobert W Maitlyn Penza, MD [facility] [email or web address] [phone] pager   [phone] cell 10/28/2013, 12:34 PM

## 2013-10-28 NOTE — Progress Notes (Signed)
SLP Trach Team Note  Patient Details Name: [name] MRN: [id number] DOB: 07/27/1965    Per chart review, pt not responsive, on ventilator.  No ST needs at this time.  Will follow.  [name], [facility] CenterMSP, CCC-SLP [phone]  Gray BernhardtCelia B Jaidy Cottam 10/28/2013, 2:29 PM

## 2013-10-28 NOTE — Progress Notes (Signed)
10/28/13 1100  Clinical Encounter Type  Visited With Family  Visit Type Follow-up;Spiritual support;Social support  Spiritual Encounters  Spiritual Needs Emotional;Prayer   Continuing to follow for spiritual, emotional, and prayer support.  Wife Harmon PierCaryn remains focused on William Bender's recovery.  Family acknowledges some tension about hopes, plans, dynamics, and [place] life [place] (his parents Cordelia PenSherry and [place] live in [place]  We talked about the importance of self-care and sustainable daily/weekly rhythm of visits and tending to other needs.  They plan to see a movie together this weekend for fun and normalcy, per request from dtr [place].  Following, but please also page as needs arise.  Thank you.  [address] South LaneChaplain Ladell Lea [place]antonLunde[place], [place] [phone]

## 2013-10-28 NOTE — Progress Notes (Addendum)
PULMONARY / CRITICAL CARE MEDICINE   Name: William Bender MRN: [id number] DOB: 02/24/1966    ADMISSION DATE:  10/05/2013 CONSULTATION DATE:  10/12/2013  REFERRING MD :  EDP  CHIEF COMPLAINT:  Shortness of breath, fever, cough  BRIEF PATIENT DESCRIPTION:  48 yo with recently diagnosis of HIV/AIDS (CD4 = 30 on 3/31) admitted 4/4 with acute hypoxemic respiratory failure and bilateral pneumonia. Of note PCP negative preadmission on Bactrim Px.  SIGNIFICANT / STUDIES EVENTS: 4/04 - Chest CTA:  No embolism, bilateral airspace disease 4/09 - Chest CT: Pneumomediastinum, small R pneumothorax, bilateral airspace disease 4/15 - RUE Venous Doppler: no DVT 4/18 - Echocardiogram: EF 60%, no RWMA 4/24 - Spontaneous R PTX > chest tube placed 4/24 - Trach tube placed 4/26 - BLE venous Dopplers: No DVT noted 4/26 - BUE venous Dopplers: No DVT noted 4/29 - placed back on IV dilaudid drip.PC increased to 25 and fio2 to 50% per float MD. 4/29 - Hgb 6.2. One unit PRBCs transfused 4/29 - Echo conference with wife, mother and father. Plan to continue full support but not escalate much from current level. Specifically discussed was possibility of ACLS in event of cardiac arrest and HD in event of acute renal failure. It was agreed that these would be excessive interventions in his current status 4/29 - started on methadone. Steroids empirically increased for non-resolving pneumonitis/ARDS. CD4 count remains < 30 4/30 - better vent synchrony with methadone started 4/29. Tolerating PS 10 cm H2O 5/01 - did not tolerate PSV 24 wean, tachypnea / agitation. Valproic acid added as adjunct for delirium  LINES / TUBES: ETT 4/5 >> 4/24 IJ CVL 4/5 >> 4/17 LUE PICC 4/17 >> 5/01 (ordered) Trach tube [name]) 4/24 >>  R chest tube 4/24 >>  LUE PICC - exchanged over wire 5/01 >>   MICRO: HIV 1 RNA Quant  3/17: 673,419  3/31: 13,616    4/11: 756  4/27: 53 AFB  BAL 4/05 >> smear neg >> NGTD Fungal BAL 4/05 >> smear neg >>  NGTD Blood 4/04 >> NEG CMV BAL 4/05 >> NEG Bact BAL 4/05 >> NEG Pneumocystis DFA 4/05 >> NEG Blood 4/11 >> NEG AFB blood cx 4/11 >> NGTD RVP 4/11 >> PARAINFLUENZA 2 Blast Abs 4/13 >> NEG Histo Abs 4/13 >> NEG Fungal BAL 4/13 >> CANDIDA ALBICANS Bact BAL 4/13 >> NEG M TB complex PCR, BAL 4/13 >> NEG CMV DNA probe (serum) 4/13 >> POSITIVE (101,862) Blood 4/16 >> 1/2 CANDIDA ALBICANS Resp 4/16 >> NEG Urine 4/16  >> NEG Resp 4/20 >> NEG Blood 4/20 >> NEG Resp 422 >> NEG Blood 4/22 >> NEG C diff 4/23 >> NEG Urine 4/26 >> NEG Resp 4/26 >> NOF Blood 4/26 >>  Resp 4/29 >> abundant GPC, abundant GNR >> NOF Blood 4/29 >>    ANTIBIOTICS: (all abx per ID) HAART Vanc 4/04 >> off Cefepime 4/04 >> off Micafungin 4/17 >> 4/24 Gancyclovir 4/6 >> 5/01 Fluconazole 4/24 >>  Azithromycin (MAC px) 4/19 >>  TMP-SMX (px dosage) 4/04 >>    SUBJECTIVE:  Failed PSV 24 wean.  Intermittent agitation. RN reports 2/3 ports of PICC with clotting issues.    VITAL SIGNS: Temp:  [97.1 F (36.2 C)-98.3 F (36.8 C)] 98.3 F (36.8 C) (05/01 0800) Pulse Rate:  [61-128] 94 (05/01 1100) Resp:  [16-59] 16 (05/01 1100) BP: (107-178)/(50-109) 152/86 mmHg (05/01 1100) SpO2:  [92 %-100 %] 97 % (05/01 1100) FiO2 (%):  [50 %] 50 % (05/01 1028) Weight:  [  142 lb 13.7 oz (64.8 kg)] 142 lb 13.7 oz (64.8 kg) (05/01 0400)  VENTILATOR SETTINGS: Vent Mode:  [-] PCV FiO2 (%):  [50 %] 50 % Set Rate:  [25 bmp-30 bmp] 25 bmp PEEP:  [5 cmH20-8 cmH20] 8 cmH20 Pressure Support:  [10 cmH20-24 cmH20] 24 cmH20 Plateau Pressure:  [27 cmH20-35 cmH20] 35 cmH20  INTAKE / OUTPUT: Intake/Output     04/30 0701 - 05/01 0700 05/01 0701 - 05/02 0700   I.V. (mL/kg) 332.5 (5.1) 3.2 (0)   Blood     NG/GT 1590 380   IV Piggyback 300    Total Intake(mL/kg) 2222.5 (34.3) 383.2 (5.9)   Urine (mL/kg/hr) 1645 (1.1) 200 (0.6)   Chest Tube 30 (0)    Total Output 1675 200   Net +547.5 +183.2         PHYSICAL EXAMINATION: Gen:  vented, RASS +1, cachectic HEENT: Trach site clean, temporal wasting, NSC PULM: Labored on PSV, improved on PCV, scattered rhonchi, CT w no air leak CV: regular, no murmur AB: BS+, soft Ext: warm no edema Neuro: MAEs  LABS: I have reviewed all of today's lab results. Relevant abnormalities are discussed in the A/P section  CXR: 5/1 - ARDS pattern,  No residual ptx  ASSESSMENT / PLAN:  PULMONARY A:  Prolonged VDRF ARDS - suspect steroid responsive component Parainfluenza virus PNA  Pneumomediastinum, resolved Spontaneous R pneumothorax Tracheostomy status  Vent dyssynchrony P:   Vent settings reviewed and adjusted - PCV 23, r 25, peep 8, 50% Cont vent bundle Cont chest tube on water seal Daily CXR as long as chest tube in place Increased systemic steroids 4/29 > 51m q 12h, he has not historically tolerated decrease of steroids  CARDIOVASCULAR A:  Intermittent sinus tachycardia, reactive (fever, pain, etc) Abnormal EKG, resolved Hypertension, controlled P:  Cont to monitor BP and rhythm  RENAL A:   Hypernatremia, resolved Met alkalosis, mild P:   Monitor BMET intermittently Monitor I/Os Correct electrolytes as indicated  GASTROINTESTINAL A:   Protein calorie malnutrition P:   SUP: Pantoprazole per tube Continue TFs  HEMATOLOGIC A:   Anemia of critical illness without evidence of acute blood loss FOB Positive  P:  DVT px: LMWH Monitor CBC intermittently Transfuse per usual ICU guidelines (Hgb < 7.0 or acute bleeding)  INFECTIOUS A:   HIV/AIDS Parainfluenza viral pneumonitis Candidemia CMV viremia Intermittent fevers P:   Micro and as above - ID service managing, appreciate assistance  ENDOCRINE A:   Steroid induced hyperglycemia P:   Cont SSI Cont Lantus  NEUROLOGIC A:    Acute encephalopathy ICU associated discomfort P:   Goal RASS 0 to -1 Methadone - started 4/29 Cont clonazepam  Cont quetiapine  Cont clonidine patch as  adjunctive therapy Add VPA 5/01 as adjunctive therapy Cont PRN lorazepam and hydromorphone   GLOBAL: -family updated in detail at bedside. Confirmed DNR status in event of cardiac arrest, They also understand that we would not undertake HD unless rest of clinical status much improved -Discussed with Dr CLinus Salmons-exchange PICC line 5/1   BNoe Gens NP-C [facility] Pgr: [phone] or [phone]  35 mins CCM time   DMerton Border MD ; P[facility] Mobile ([phone]  After 5:30 PM or weekends, call [phone]

## 2013-10-28 NOTE — Progress Notes (Signed)
[name], mother and young son were bedside when I arrived. Pt's nurse referred me to pt saying they appreciated visits. Pt's family was very thankful for visit and prayer. Pt's son it was "annoying" to see his dad back in hospital. He was very respectful and pleasant. Family appreciates visits. Marjory LiesPamela Carrington Holder Chaplain

## 2013-10-28 DEATH — deceased

## 2013-10-29 LAB — CULTURE, BLOOD (ROUTINE X 2): Culture: NO GROWTH

## 2013-10-29 LAB — BASIC METABOLIC PANEL
BUN: 24 mg/dL — ABNORMAL HIGH (ref 6–23)
CALCIUM: 8.4 mg/dL (ref 8.4–10.5)
CO2: 38 mEq/L — ABNORMAL HIGH (ref 19–32)
Chloride: 102 mEq/L (ref 96–112)
Creatinine, Ser: 0.37 mg/dL — ABNORMAL LOW (ref 0.50–1.35)
GFR calc non Af Amer: >90 mL/min (ref >90)
GLUCOSE: 152 mg/dL — AB (ref 70–99)
POTASSIUM: 5.1 meq/L (ref 3.7–5.3)
SODIUM: 144 meq/L (ref 137–147)

## 2013-10-29 LAB — CBC
HEMATOCRIT: 26 % — AB (ref 39.0–52.0)
Hemoglobin: 7.8 g/dL — ABNORMAL LOW (ref 13.0–17.0)
MCH: 28.9 pg (ref 26.0–34.0)
MCHC: 30 g/dL (ref 30.0–36.0)
MCV: 96.3 fL (ref 78.0–100.0)
Platelets: 226 10*3/uL (ref 150–400)
RBC: 2.7 MIL/uL — AB (ref 4.22–5.81)
RDW: 18.2 % — ABNORMAL HIGH (ref 11.5–15.5)
WBC: 9.5 10*3/uL (ref 4.0–10.5)

## 2013-10-29 LAB — BLOOD GAS, ARTERIAL
ACID-BASE EXCESS: 9.3 mmol/L — AB (ref 0.0–2.0)
BICARBONATE: 35 meq/L — AB (ref 20.0–24.0)
Drawn by: 23604
FIO2: 0.5 %
O2 SAT: 96.6 %
PATIENT TEMPERATURE: 99.8
PEEP: 8 cmH2O
PO2 ART: 88.9 mmHg (ref 80.0–100.0)
Pressure control: 23 cmH2O
RATE: 25 resp/min
TCO2: 33.2 mmol/L (ref 0–100)
pCO2 arterial: 60.2 mmHg (ref 35.0–45.0)
pH, Arterial: 7.386 (ref 7.350–7.450)

## 2013-10-29 LAB — GLUCOSE, CAPILLARY
GLUCOSE-CAPILLARY: 133 mg/dL — AB (ref 70–99)
GLUCOSE-CAPILLARY: 147 mg/dL — AB (ref 70–99)
GLUCOSE-CAPILLARY: 155 mg/dL — AB (ref 70–99)
Glucose-Capillary: 139 mg/dL — ABNORMAL HIGH (ref 70–99)
Glucose-Capillary: 152 mg/dL — ABNORMAL HIGH (ref 70–99)

## 2013-10-29 LAB — CULTURE, RESPIRATORY

## 2013-10-29 LAB — CULTURE, RESPIRATORY W GRAM STAIN

## 2013-10-29 MED ORDER — CLONAZEPAM 0.5 MG PO TBDP
1.5000 mg | ORAL_TABLET | Freq: Two times a day (BID) | ORAL | Status: DC
  Administered 2013-10-29 – 2013-11-05 (×16): 1.5 mg
  Filled 2013-10-29 (×17): qty 3

## 2013-10-29 MED ORDER — CLONAZEPAM 0.5 MG PO TBDP: 1.5000 mg | ORAL_TABLET | Freq: Two times a day (BID) | ORAL | Status: DC

## 2013-10-29 NOTE — Progress Notes (Signed)
Assess PICC for exchange.  TPA instilled over night successful for good blood return.  All port blood return and flushes well.  RN notified and to inform MD

## 2013-10-29 NOTE — Progress Notes (Signed)
PULMONARY / CRITICAL CARE MEDICINE   Name: William Bender MRN: [id number] DOB: 1965-09-17    ADMISSION DATE:  10/12/2013 CONSULTATION DATE:  10/03/2013  REFERRING MD :  EDP  CHIEF COMPLAINT:  Shortness of breath, fever, cough  BRIEF PATIENT DESCRIPTION:  48 yo with recently diagnosis of HIV/AIDS (CD4 = 30 on 3/31) admitted 4/4 with acute hypoxemic respiratory failure and bilateral pneumonia. Of note PCP negative preadmission on Bactrim Px.  SIGNIFICANT / STUDIES EVENTS: 4/04 - Chest CTA:  No embolism, bilateral airspace disease 4/09 - Chest CT: Pneumomediastinum, small R pneumothorax, bilateral airspace disease 4/15 - RUE Venous Doppler: no DVT 4/18 - Echocardiogram: EF 60%, no RWMA 4/24 - Spontaneous R PTX > chest tube placed 4/24 - Trach tube placed 4/26 - BLE venous Dopplers: No DVT noted 4/26 - BUE venous Dopplers: No DVT noted 4/29 - placed back on IV dilaudid drip.PC increased to 25 and fio2 to 50% per float MD. 4/29 - Hgb 6.2. One unit PRBCs transfused 4/29 - [place] conference with wife, mother and father. Plan to continue full support but not escalate much from current level. Specifically discussed was possibility of ACLS in event of cardiac arrest and HD in event of acute renal failure. It was agreed that these would be excessive interventions in his current status 4/29 - started on methadone. Steroids empirically increased for non-resolving pneumonitis/ARDS. CD4 count remains < 30 4/30 - better vent synchrony with methadone started 4/29. Tolerating PS 10 cm H2O 5/01 - did not tolerate PSV 24 wean, tachypnea / agitation. Valproic acid added as adjunct for delirium  LINES / TUBES: ETT 4/5 >> 4/24 IJ CVL 4/5 >> 4/17 LUE PICC 4/17 >> 5/01 (ordered) Trach tube [name]) 4/24 >>  R chest tube 4/24 >>  LUE PICC - exchanged over wire 5/01 >>   MICRO: HIV 1 RNA Quant  3/17: 425,956  3/31: 13,616    4/11: 756  4/27: 53 AFB  BAL 4/05 >> smear neg >> NGTD Fungal BAL 4/05 >> smear neg >>  NGTD Blood 4/04 >> NEG CMV BAL 4/05 >> NEG Bact BAL 4/05 >> NEG Pneumocystis DFA 4/05 >> NEG Blood 4/11 >> NEG AFB blood cx 4/11 >> NGTD RVP 4/11 >> PARAINFLUENZA 2 Blast Abs 4/13 >> NEG Histo Abs 4/13 >> NEG Fungal BAL 4/13 >> CANDIDA ALBICANS Bact BAL 4/13 >> NEG M TB complex PCR, BAL 4/13 >> NEG CMV DNA probe (serum) 4/13 >> POSITIVE (101,862) Blood 4/16 >> 1/2 CANDIDA ALBICANS Resp 4/16 >> NEG Urine 4/16  >> NEG Resp 4/20 >> NEG Blood 4/20 >> NEG Resp 422 >> NEG Blood 4/22 >> NEG C diff 4/23 >> NEG Urine 4/26 >> NEG Resp 4/26 >> NOF Blood 4/26 >>  Resp 4/29 >> abundant GPC, abundant GNR >> NOF Blood 4/29 >>  C diff 5/1 >> negative   ANTIBIOTICS: (all abx per ID) HAART Vanc 4/04 >> off Cefepime 4/04 >> off Micafungin 4/17 >> 4/24 Gancyclovir 4/6 >> 5/01 Fluconazole 4/24 >> planned thru 5/4, then to prophylactic dosing Azithromycin (MAC px) 4/19 >>  TMP-SMX (px dosage) 4/04 >>    SUBJECTIVE:   Received dilaudid this am, was bradypneic on PSV  Currently on PCV and PEEP 8  VITAL SIGNS: Temp:  [97.3 F (36.3 C)-100.4 F (38 C)] 98.8 F (37.1 C) (05/02 0800) Pulse Rate:  [67-134] 77 (05/02 0900) Resp:  [8-51] 30 (05/02 0900) BP: (129-174)/(62-98) 174/81 mmHg (05/02 0900) SpO2:  [91 %-100 %] 91 % (05/02 0900) FiO2 (%):  [  40 %-50 %] 40 % (05/02 0845) Weight:  [69.9 kg (154 lb 1.6 oz)] 69.9 kg (154 lb 1.6 oz) (05/02 0500)  VENTILATOR SETTINGS: Vent Mode:  [-] PCV FiO2 (%):  [40 %-50 %] 40 % Set Rate:  [25 bmp] 25 bmp PEEP:  [8 cmH20] 8 cmH20 Plateau Pressure:  [19 cmH20-38 cmH20] 32 cmH20  INTAKE / OUTPUT: Intake/Output     05/01 0701 - 05/02 0700 05/02 0701 - 05/03 0700   I.V. (mL/kg) 273.2 (3.9) 40 (0.6)   NG/GT 2080 90   IV Piggyback 200    Total Intake(mL/kg) 2553.2 (36.5) 130 (1.9)   Urine (mL/kg/hr) 1395 (0.8) 150 (0.7)   Chest Tube     Total Output 1395 150   Net +1158.2 -20        Stool Occurrence 3 x     PHYSICAL EXAMINATION: Gen:  vented, RASS +1, cachectic HEENT: Trach site clean, temporal wasting, NSC PULM: on PCV, scattered rhonchi, no air leak chest tube CV: regular, no murmur AB: BS+, soft Ext: warm no edema Neuro: MAEs  LABS:  Recent Labs Lab 10/26/13 0430 10/29/13 0638  PHART 7.449 7.386  PCO2ART 47.6* 60.2*  PO2ART 77.8* 88.9  HCO3 32.1* 35.0*  TCO2 30.4 33.2  O2SAT 94.9 96.6    Recent Labs Lab 10/27/13 0455 10/28/13 0545 10/29/13 0620  HGB 7.4* 7.9* 7.8*  HCT 23.8* 24.9* 26.0*  WBC 6.5 9.9 9.5  PLT 170 231 226    Recent Labs Lab 10/25/13 0400 10/26/13 0600 10/27/13 0455 10/28/13 0545 10/29/13 0620  NA 146 139 141 142 144  K 4.0 4.1 4.6 4.3 5.1  CL 106 100 104 102 102  CO2 33* 32 30 35* 38*  GLUCOSE 161* 127* 164* 147* 152*  BUN 30* 20 19 28* 24*  CREATININE 0.42* 0.40* 0.33* 0.36* 0.37*  CALCIUM 8.2* 8.0* 7.9* 8.3* 8.4    Recent Labs Lab 10/27/13 0610  INR 1.21    Recent Labs Lab 10/26/13 0600 10/27/13 0610  AST 40*  --   ALT 143*  --   ALKPHOS 74  --   BILITOT 0.3  --   PROT 4.4*  --   ALBUMIN 1.4*  --   INR  --  1.21    Dg Chest Port 1 View  10/28/2013   CLINICAL DATA:  Chest tube.  Pneumothorax.  EXAM: PORTABLE CHEST - 1 VIEW  COMPARISON:  10/27/2013  FINDINGS: Tracheostomy tube overlies the airway. Enteric tube courses towards the left upper abdomen. Right-sided chest tube remains in place, unchanged. Left PICC remains in place with tip overlying the lower SVC. Diffuse bilateral airspace opacities do not appear significantly changed. Elevation of the right hemidiaphragm is unchanged. Tiny right apical pneumothorax on the prior study is no longer clearly identified on the current examination. Subcutaneous emphysema remains in the right chest wall.  IMPRESSION: 1. Stable position of support apparatus. No definite residual pneumothorax identified. 2. Unchanged, diffuse bilateral airspace opacities.   Electronically Signed   By: Logan Bores   On: 10/28/2013 07:55    CXR: 5/1 - ARDS pattern,  No residual ptx  ASSESSMENT / PLAN:  PULMONARY A:  Prolonged VDRF ARDS - suspect steroid responsive component Parainfluenza virus PNA  Pneumomediastinum, resolved Spontaneous R pneumothorax Tracheostomy status  Vent dyssynchrony P:   Vent settings reviewed and adjusted - PCV 23, r 25, peep 8, 50% Cont vent bundle Cont chest tube on water seal Daily CXR as long as chest tube in place; would  be risky to remove when he is requiring high vent pressures Increased systemic steroids 4/29 > 86m q 12h, he has not historically tolerated decrease of steroids  CARDIOVASCULAR A:  Intermittent sinus tachycardia, reactive (fever, pain, etc) Abnormal EKG, resolved Hypertension, controlled P:  Cont to monitor BP and rhythm  RENAL A:   Hypernatremia, resolved Met alkalosis, mild P:   Monitor BMET intermittently Monitor I/Os Correct electrolytes as indicated  GASTROINTESTINAL A:   Protein calorie malnutrition P:   SUP: Pantoprazole per tube Continue TFs  HEMATOLOGIC A:   Anemia of critical illness without evidence of acute blood loss FOB Positive  P:  DVT px: LMWH Monitor CBC intermittently Transfuse per usual ICU guidelines (Hgb < 7.0 or acute bleeding)  INFECTIOUS A:   HIV/AIDS Parainfluenza viral pneumonitis Candidemia CMV viremia Intermittent fevers P:   Micro and as above - ID service managing, appreciate assistance  ENDOCRINE A:   Steroid induced hyperglycemia P:   Cont SSI Cont Lantus  NEUROLOGIC A:    Acute encephalopathy and agitation > on 5/2 very somnolent and unable to perform PSV, suspect due to adjusted sedation regimen.  ICU associated discomfort P:   Goal RASS 0 to -1 Methadone - started 4/29 On clonazepam > will decrease slightly 5/2 after addition of VPA on 5/1 Cont quetiapine  Cont clonidine patch as adjunctive therapy Added VPA 5/01 as adjunctive therapy Cont PRN lorazepam and  hydromorphone   GLOBAL: -Confirmed DNR status in event of cardiac arrest, They also understand that we would not undertake HD unless rest of clinical status much improved -will adjust clonazepam down slightly as he will not do PSV on 5/2; note VPA was added on 5/1  CC time 35 minutes  RBaltazar Apo MD, PhD 10/29/2013, 10:14 AM [place] [facility] [place] [facility] [phone] if no answer [phone]

## 2013-10-29 NOTE — Progress Notes (Signed)
Patient ID: William Bender, male   DOB: 06/05/1966, 48 y.o.   MRN: [id number]         [facility]    Date of Admission:  10/24/2013                   Day 15 antifungal therapy Principal Problem:   Pneumonia Active Problems:   Acute respiratory failure   AIDS   PCP (pneumocystis carinii pneumonia)   CMV (cytomegalovirus)   Sacral decubitus ulcer, stage II   Hyponatremia   Pneumomediastinum   Fungemia   Spontaneous pneumothorax   Pneumothorax on right   . antiseptic oral rinse  15 mL Mouth Rinse QID  . azithromycin  1,200 mg Per Tube Weekly  . chlorhexidine  15 mL Mouth Rinse BID  . clonazepam  2 mg Per Tube BID  . cloNIDine  0.1 mg Transdermal Weekly  . collagenase   Topical Daily  . dolutegravir  50 mg Oral Daily  . emtricitabine-tenofovir  1 tablet Per Tube Daily  . enoxaparin (LOVENOX) injection  40 mg Subcutaneous Daily  . etomidate  40 mg Intravenous Once  . feeding supplement (OXEPA)  1,000 mL Per Tube Q24H  . feeding supplement (PRO-STAT SUGAR FREE 64)  30 mL Per Tube QID  . fluconazole (DIFLUCAN) IV  400 mg Intravenous Q24H  . free water  200 mL Per Tube Q4H  . insulin aspart  0-15 Units Subcutaneous 6 times per day  . insulin glargine  25 Units Subcutaneous Daily  . methadone  20 mg Per Tube 3 times per day  . methylPREDNISolone (SOLU-MEDROL) injection  80 mg Intravenous Q12H  . metoprolol tartrate  25 mg Per Tube BID  . multivitamin  5 mL Per Tube Daily  . pantoprazole sodium  40 mg Per Tube Daily  . QUEtiapine  200 mg Per Tube BID  . sodium chloride  10-40 mL Intracatheter Q12H  . sulfamethoxazole-trimethoprim  1 tablet Per Tube Daily  . Valproic Acid  500 mg Per Tube BID    Objective: Temp:  [97.3 F (36.3 C)-100.4 F (38 C)] 98.8 F (37.1 C) (05/02 0800) Pulse Rate:  [67-134] 78 (05/02 0600) Resp:  [8-51] 26 (05/02 0600) BP: (129-178)/(62-109) 154/73 mmHg (05/02 0600) SpO2:  [95 %-100 %] 99 % (05/02 0600) FiO2 (%):  [50  %] 50 % (05/02 0600) Weight:  [154 lb 1.6 oz (69.9 kg)] 154 lb 1.6 oz (69.9 kg) (05/02 0500)  General: He is sedated and resting quietly on the ventilator. Skin: No rash.  Lungs: Clear anteriorly Cor: Regular S1-S2 no murmurs Abdomen: Soft    Lab Results Lab Results  Component Value Date   WBC 9.5 10/29/2013   HGB 7.8* 10/29/2013   HCT 26.0* 10/29/2013   MCV 96.3 10/29/2013   PLT 226 10/29/2013    Lab Results  Component Value Date   CREATININE 0.37* 10/29/2013   BUN 24* 10/29/2013   NA 144 10/29/2013   K 5.1 10/29/2013   CL 102 10/29/2013   CO2 38* 10/29/2013    Lab Results  Component Value Date   ALT 143* 10/26/2013   AST 40* 10/26/2013   ALKPHOS 74 10/26/2013   BILITOT 0.3 10/26/2013      HIV 1 RNA Quant (copies/mL)  Date Value  10/24/2013 53*  10/08/2013 756*  09/27/2013 13616*     CD4 T Cell Abs (/uL)  Date Value  10/24/2013 30*  10/08/2013 30*  09/27/2013 30*   Microbiology: Recent  Results (from the past 240 hour(s))  CULTURE, RESPIRATORY (NON-EXPECTORATED)     Status: None   Collection Time    10/19/13  5:53 PM      Result Value Ref Range Status   Specimen Description TRACHEAL ASPIRATE   Final   Special Requests NONE   Final   Gram Stain     Final   Value: ABUNDANT WBC PRESENT,BOTH PMN AND MONONUCLEAR     NO SQUAMOUS EPITHELIAL CELLS SEEN     NO ORGANISMS SEEN     Performed at [facility]   Culture     Final   Value: Non-Pathogenic Oropharyngeal-type Flora Isolated.     Performed at [facility]   Report Status 10/22/2013 FINAL   Final  CULTURE, BLOOD (ROUTINE X 2)     Status: None   Collection Time    10/19/13  5:55 PM      Result Value Ref Range Status   Specimen Description BLOOD RIGHT ARM   Final   Special Requests BOTTLES DRAWN AEROBIC AND ANAEROBIC 8CC   Final   Culture  Setup Time     Final   Value: 10/19/2013 21:55     Performed at [facility]   Culture     Final   Value: NO GROWTH 5 DAYS     Performed at [facility]    Report Status 10/25/2013 FINAL   Final  CULTURE, BLOOD (ROUTINE X 2)     Status: None   Collection Time    10/19/13  6:00 PM      Result Value Ref Range Status   Specimen Description BLOOD RIGHT ARM   Final   Special Requests BOTTLES DRAWN AEROBIC ONLY 2CC   Final   Culture  Setup Time     Final   Value: 10/19/2013 21:55     Performed at [facility]   Culture     Final   Value: NO GROWTH 5 DAYS     Performed at [facility]   Report Status 10/25/2013 FINAL   Final  CLOSTRIDIUM DIFFICILE BY PCR     Status: None   Collection Time    10/20/13  6:10 AM      Result Value Ref Range Status   C difficile by pcr NEGATIVE  NEGATIVE Final   Comment: Performed at [facility]  CULTURE, RESPIRATORY (NON-EXPECTORATED)     Status: None   Collection Time    10/23/13 11:40 AM      Result Value Ref Range Status   Specimen Description TRACHEAL ASPIRATE   Final   Special Requests Immunocompromised   Final   Gram Stain     Final   Value: RARE WBC PRESENT,BOTH PMN AND MONONUCLEAR     RARE SQUAMOUS EPITHELIAL CELLS PRESENT     MODERATE GRAM POSITIVE COCCI     IN PAIRS FEW GRAM NEGATIVE RODS     RARE GRAM POSITIVE RODS   Culture     Final   Value: Non-Pathogenic Oropharyngeal-type Flora Isolated.     Performed at [facility]   Report Status 10/25/2013 FINAL   Final  CULTURE, BLOOD (ROUTINE X 2)     Status: None   Collection Time    10/23/13 11:55 AM      Result Value Ref Range Status   Specimen Description BLOOD RIGHT ARM   Final   Special Requests     Final   Value: BOTTLES DRAWN AEROBIC AND ANAEROBIC  4CC BOTH BOTTLES   Culture  Setup Time     Final   Value: 10/23/2013 18:28     [facility]   Culture     Final   Value:        BLOOD CULTURE RECEIVED NO GROWTH TO DATE CULTURE WILL BE HELD FOR 5 DAYS BEFORE ISSUING A FINAL NEGATIVE REPORT     [facility]   Report Status PENDING   Incomplete  URINE CULTURE     Status:  None   Collection Time    10/23/13 12:02 PM      Result Value Ref Range Status   Specimen Description URINE, CATHETERIZED   Final   Special Requests Immunocompromised   Final   Culture  Setup Time     Final   Value: 10/23/2013 20:38     [facility]   Colony Count     Final   Value: NO GROWTH     [facility]   Culture     Final   Value: NO GROWTH     [facility]   Report Status 10/24/2013 FINAL   Final  CULTURE, RESPIRATORY (NON-EXPECTORATED)     Status: None   Collection Time    10/26/13  5:30 PM      Result Value Ref Range Status   Specimen Description TRACHEAL ASPIRATE   Final   Special Requests NONE   Final   Gram Stain     Final   Value: MODERATE WBC PRESENT,BOTH PMN AND MONONUCLEAR     RARE SQUAMOUS EPITHELIAL CELLS PRESENT     ABUNDANT GRAM POSITIVE COCCI IN PAIRS     ABUNDANT GRAM NEGATIVE RODS     [facility]   Culture     Final   Value: Non-Pathogenic Oropharyngeal-type Flora Isolated.     [facility]formed at [facility]   Report Status PENDING   Incomplete  CULTURE, BLOOD (ROUTINE X 2)     Status: None   Collection Time    10/26/13  6:25 PM      Result Value Ref Range Status   Specimen Description BLOOD RIGHT ARM   Final   Special Requests BOTTLES DRAWN AEROBIC AND ANAEROBIC 5CC   Final   Culture  Setup Time     Final   Value: 10/27/2013 01:59     [facility]   Culture     Final   Value:        BLOOD CULTURE RECEIVED NO GROWTH TO DATE CULTURE WILL BE HELD FOR 5 DAYS BEFORE ISSUING A FINAL NEGATIVE REPORT     [facility]   Report Status PENDING   Incomplete  CULTURE, BLOOD (ROUTINE X 2)     Status: None   Collection Time    10/26/13  6:31 PM      Result Value Ref Range Status   Specimen Description BLOOD RIGHT HAND   Final   Special Requests BOTTLES DRAWN AEROBIC ONLY 3CC   Final   Culture  Setup Time     Final   Value:  10/27/2013 01:59     [facility]   Culture     Final   Value:        BLOOD CULTURE RECEIVED NO GROWTH TO DATE CULTURE WILL BE HELD FOR 5 DAYS BEFORE ISSUING A FINAL NEGATIVE REPORT  Performed at [facility]   Report Status PENDING   Incomplete  CLOSTRIDIUM DIFFICILE BY PCR     Status: None   Collection Time    10/28/13  1:23 PM      Result Value Ref Range Status   C difficile by pcr NEGATIVE  NEGATIVE Final   Comment: Performed at [facility]    Studies/Results: Dg Chest Port 1 View  10/28/2013   CLINICAL DATA:  Chest tube.  Pneumothorax.  EXAM: PORTABLE CHEST - 1 VIEW  COMPARISON:  10/27/2013  FINDINGS: Tracheostomy tube overlies the airway. Enteric tube courses towards the left upper abdomen. Right-sided chest tube remains in place, unchanged. Left PICC remains in place with tip overlying the lower SVC. Diffuse bilateral airspace opacities do not appear significantly changed. Elevation of the right hemidiaphragm is unchanged. Tiny right apical pneumothorax on the prior study is no longer clearly identified on the current examination. Subcutaneous emphysema remains in the right chest wall.  IMPRESSION: 1. Stable position of support apparatus. No definite residual pneumothorax identified. 2. Unchanged, diffuse bilateral airspace opacities.   Electronically Signed   By: Sebastian Ache   On: 10/28/2013 07:55    Assessment: He continues to be ventilator dependent from his ARDS. His fevers have decreased recently and all recent cultures have been negative. I believe there has been some clearing in his right upper lobe on chest x-ray over the past week.  Plan: 1. Continue full dose fluconazole through May 4 then convert to a once weekly prophylactic dose  2. Continue current antiretroviral therapy and prophylaxis for pneumocystis Mycobacterium avium 3. I discussed these plans with Gautham's parents  Cliffton Asters, MD [facility] [phone] pager   [phone] cell 10/29/2013, 9:26 AM

## 2013-10-29 NOTE — Therapy (Signed)
Dr. Delton CoombesByrum aware of the High Cuff pressures to maintain a good seal. Initial cuff pressure was 50cm, tried to decreased to a Cuff pressure of 30cm, but a small leak still exist. RT will continue to monitor.

## 2013-10-30 ENCOUNTER — Inpatient Hospital Stay (HOSPITAL_COMMUNITY): Payer: BC Managed Care – PPO

## 2013-10-30 LAB — CBC
HCT: 26.9 % — ABNORMAL LOW (ref 39.0–52.0)
Hemoglobin: 8.2 g/dL — ABNORMAL LOW (ref 13.0–17.0)
MCH: 29.4 pg (ref 26.0–34.0)
MCHC: 30.5 g/dL (ref 30.0–36.0)
MCV: 96.4 fL (ref 78.0–100.0)
Platelets: 277 10*3/uL (ref 150–400)
RBC: 2.79 MIL/uL — AB (ref 4.22–5.81)
RDW: 17.9 % — ABNORMAL HIGH (ref 11.5–15.5)
WBC: 14.7 10*3/uL — ABNORMAL HIGH (ref 4.0–10.5)

## 2013-10-30 LAB — GLUCOSE, CAPILLARY
GLUCOSE-CAPILLARY: 145 mg/dL — AB (ref 70–99)
GLUCOSE-CAPILLARY: 154 mg/dL — AB (ref 70–99)
Glucose-Capillary: 148 mg/dL — ABNORMAL HIGH (ref 70–99)
Glucose-Capillary: 122 mg/dL — ABNORMAL HIGH (ref 70–99)
Glucose-Capillary: 123 mg/dL — ABNORMAL HIGH (ref 70–99)
Glucose-Capillary: 147 mg/dL — ABNORMAL HIGH (ref 70–99)
Glucose-Capillary: 144 mg/dL — ABNORMAL HIGH (ref 70–99)

## 2013-10-30 LAB — PHOSPHORUS: Phosphorus: 2.8 mg/dL (ref 2.3–4.6)

## 2013-10-30 LAB — BASIC METABOLIC PANEL
BUN: 22 mg/dL (ref 6–23)
CHLORIDE: 98 meq/L (ref 96–112)
CO2: 41 mEq/L (ref 19–32)
CREATININE: 0.33 mg/dL — AB (ref 0.50–1.35)
Calcium: 8.1 mg/dL — ABNORMAL LOW (ref 8.4–10.5)
GFR calc Af Amer: >90 mL/min (ref >90)
Glucose, Bld: 160 mg/dL — ABNORMAL HIGH (ref 70–99)
POTASSIUM: 4.6 meq/L (ref 3.7–5.3)
Sodium: 142 mEq/L (ref 137–147)

## 2013-10-30 LAB — MAGNESIUM: MAGNESIUM: 2.2 mg/dL (ref 1.5–2.5)

## 2013-10-30 MED ORDER — VALPROIC ACID 250 MG/5ML PO SYRP
250.0000 mg | ORAL_SOLUTION | Freq: Two times a day (BID) | ORAL | Status: DC
Start: 2013-10-30 — End: 2013-11-06
  Administered 2013-10-30 – 2013-11-05 (×14): 250 mg
  Filled 2013-10-30 (×17): qty 5

## 2013-10-30 MED ORDER — QUETIAPINE FUMARATE 100 MG PO TABS
100.0000 mg | ORAL_TABLET | Freq: Two times a day (BID) | ORAL | Status: DC
Start: 2013-10-30 — End: 2013-10-31
  Administered 2013-10-30 – 2013-10-31 (×3): 100 mg
  Filled 2013-10-30 (×4): qty 1

## 2013-10-30 NOTE — Progress Notes (Addendum)
PULMONARY / CRITICAL CARE MEDICINE   Name: Vineeth Fell MRN: [id number] DOB: 10-Apr-1966    ADMISSION DATE:  10/15/2013 CONSULTATION DATE:  10/05/2013  REFERRING MD :  EDP  CHIEF COMPLAINT:  Shortness of breath, fever, cough  BRIEF PATIENT DESCRIPTION:  48 yo with recently diagnosis of HIV/AIDS (CD4 = 30 on 3/31) admitted 4/4 with acute hypoxemic respiratory failure and bilateral pneumonia. Of note PCP negative preadmission on Bactrim Px.  SIGNIFICANT / STUDIES EVENTS: 4/04 - Chest CTA:  No embolism, bilateral airspace disease 4/09 - Chest CT: Pneumomediastinum, small R pneumothorax, bilateral airspace disease 4/15 - RUE Venous Doppler: no DVT 4/18 - Echocardiogram: EF 60%, no RWMA 4/24 - Spontaneous R PTX > chest tube placed 4/24 - Trach tube placed 4/26 - BLE venous Dopplers: No DVT noted 4/26 - BUE venous Dopplers: No DVT noted 4/29 - placed back on IV dilaudid drip.PC increased to 25 and fio2 to 50% per float MD. 4/29 - Hgb 6.2. One unit PRBCs transfused 4/29 - [place] conference with wife, mother and father. Plan to continue full support but not escalate much from current level. Specifically discussed was possibility of ACLS in event of cardiac arrest and HD in event of acute renal failure. It was agreed that these would be excessive interventions in his current status 4/29 - started on methadone. Steroids empirically increased for non-resolving pneumonitis/ARDS. CD4 count remains < 30 4/30 - better vent synchrony with methadone started 4/29. Tolerating PS 10 cm H2O 5/01 - did not tolerate PSV 24 wean, tachypnea / agitation. Valproic acid added as adjunct for delirium  LINES / TUBES: ETT 4/5 >> 4/24 IJ CVL 4/5 >> 4/17 LUE PICC 4/17 >> 5/01 (ordered) Trach tube [name]) 4/24 >>  R chest tube 4/24 >>  LUE PICC - exchanged over wire 5/01 >>   MICRO: HIV 1 RNA Quant  3/17: 166,063  3/31: 13,616    4/11: 756  4/27: 53 AFB  BAL 4/05 >> smear neg >> NGTD Fungal BAL 4/05 >> smear neg >>  NGTD Blood 4/04 >> NEG CMV BAL 4/05 >> NEG Bact BAL 4/05 >> NEG Pneumocystis DFA 4/05 >> NEG Blood 4/11 >> NEG AFB blood cx 4/11 >> NGTD RVP 4/11 >> PARAINFLUENZA 2 Blast Abs 4/13 >> NEG Histo Abs 4/13 >> NEG Fungal BAL 4/13 >> CANDIDA ALBICANS Bact BAL 4/13 >> NEG M TB complex PCR, BAL 4/13 >> NEG CMV DNA probe (serum) 4/13 >> POSITIVE (101,862) Blood 4/16 >> 1/2 CANDIDA ALBICANS Resp 4/16 >> NEG Urine 4/16  >> NEG Resp 4/20 >> NEG Blood 4/20 >> NEG Resp 422 >> NEG Blood 4/22 >> NEG C diff 4/23 >> NEG Urine 4/26 >> NEG Resp 4/26 >> NOF Blood 4/26 >>  Resp 4/29 >> abundant GPC, abundant GNR >> NOF Blood 4/29 >>  C diff 5/1 >> negative   ANTIBIOTICS: (all abx per ID) HAART Vanc 4/04 >> off Cefepime 4/04 >> off Micafungin 4/17 >> 4/24 Gancyclovir 4/6 >> 5/01 Fluconazole 4/24 >> planned thru 5/4, then to prophylactic dosing Azithromycin (MAC px) 4/19 >>  TMP-SMX (px dosage) 4/04 >>    SUBJECTIVE:   Foley was removed 5/2 pm but did not void so foley replaced Currently on PCV and PEEP 8 Has been more somnolent last 48 hours  VITAL SIGNS: Temp:  [97 F (36.1 C)-99.3 F (37.4 C)] 97 F (36.1 C) (05/03 0800) Pulse Rate:  [60-134] 72 (05/03 0700) Resp:  [13-57] 25 (05/03 0700) BP: (132-173)/(61-92) 156/70 mmHg (05/03 0700)  SpO2:  [90 %-99 %] 95 % (05/03 0700) FiO2 (%):  [40 %] 40 % (05/03 0456) Weight:  [69.7 kg (153 lb 10.6 oz)] 69.7 kg (153 lb 10.6 oz) (05/03 0500)  VENTILATOR SETTINGS: Vent Mode:  [-] PCV FiO2 (%):  [40 %] 40 % Set Rate:  [25 bmp] 25 bmp PEEP:  [8 cmH20] 8 cmH20 Plateau Pressure:  [23 cmH20-26 cmH20] 26 cmH20  INTAKE / OUTPUT: Intake/Output     05/02 0701 - 05/03 0700 05/03 0701 - 05/04 0700   I.V. (mL/kg) 470 (6.7)    NG/GT 1980    IV Piggyback 200    Total Intake(mL/kg) 2650 (38)    Urine (mL/kg/hr) 1645 (1)    Total Output 1645     Net +1005          Stool Occurrence      PHYSICAL EXAMINATION: Gen: vented, RASS -3,  cachectic HEENT: Trach sutures on R with slight skin breakdown, no abscess or ulcer, no expressed purulence PULM: on PCV, scattered rhonchi, no air leak chest tube CV: regular, no murmur AB: BS+, soft Ext: warm no edema Neuro: very sedated, won't wake to stim or voice  LABS:  Recent Labs Lab 10/26/13 0430 10/29/13 0638  PHART 7.449 7.386  PCO2ART 47.6* 60.2*  PO2ART 77.8* 88.9  HCO3 32.1* 35.0*  TCO2 30.4 33.2  O2SAT 94.9 96.6    Recent Labs Lab 10/28/13 0545 10/29/13 0620 10/30/13 0540  HGB 7.9* 7.8* 8.2*  HCT 24.9* 26.0* 26.9*  WBC 9.9 9.5 14.7*  PLT 231 226 277    Recent Labs Lab 10/26/13 0600 10/27/13 0455 10/28/13 0545 10/29/13 0620 10/30/13 0540  NA 139 141 142 144 142  K 4.1 4.6 4.3 5.1 4.6  CL 100 104 102 102 98  CO2 32 30 35* 38* 41*  GLUCOSE 127* 164* 147* 152* 160*  BUN 20 19 28* 24* 22  CREATININE 0.40* 0.33* 0.36* 0.37* 0.33*  CALCIUM 8.0* 7.9* 8.3* 8.4 8.1*  MG  --   --   --   --  2.2  PHOS  --   --   --   --  2.8    Recent Labs Lab 10/27/13 0610  INR 1.21    Recent Labs Lab 10/26/13 0600 10/27/13 0610  AST 40*  --   ALT 143*  --   ALKPHOS 74  --   BILITOT 0.3  --   PROT 4.4*  --   ALBUMIN 1.4*  --   INR  --  1.21    Dg Chest Port 1 View  10/30/2013   CLINICAL DATA:  Airspace disease.  EXAM: PORTABLE CHEST - 1 VIEW  COMPARISON:  DG CHEST 1V PORT dated 10/28/2013  FINDINGS: Tracheostomy tube, NG tube, left PICC line, right chest tube in stable position. Mediastinum and hilar regions are stable. Heart size is stable. There is mild cardiomegaly. Persistent bilateral pulmonary alveolar infiltrates are present. These are unchanged. No pneumothorax. Subcutaneous emphysema right chest again noted. Subcutaneous emphysema is now noted in the soft tissues of the neck bilaterally.  IMPRESSION: 1. Line and tube positions stable. 2. Stable dense bilateral pulmonary infiltrates. 3. Subcutaneous emphysema again noted along the right chest wall. New  onset of subcutaneous emphysema noted in the soft tissues of the neck bilaterally.   Electronically Signed   By: Marcello Moores  Register   On: 10/30/2013 07:13   CXR: 5/1 - ARDS pattern,  No residual ptx  ASSESSMENT / PLAN:  PULMONARY A:  Prolonged  VDRF ARDS - suspect steroid responsive component Parainfluenza virus PNA  Pneumomediastinum, resolved Spontaneous R pneumothorax Tracheostomy status, ? Some evidence trach suture skin breakdown Vent dyssynchrony P:   Vent settings reviewed - PCV 23, r 25, peep 8, 50% Cont vent bundle Cont chest tube on water seal Daily CXR as long as chest tube in place; would be risky to remove when he is requiring high vent pressures Increased systemic steroids 4/29 > 39m q 12h, he has not historically tolerated decrease of steroids Will remove sutures 5/3; there doesn't appear to be a discrete lesion to culture  CARDIOVASCULAR A:  Intermittent sinus tachycardia, reactive (fever, pain, etc) Abnormal EKG, resolved Hypertension, controlled P:  Cont to monitor BP and rhythm  RENAL A:   Hypernatremia, resolved Met alkalosis, mild P:   Monitor BMET intermittently Monitor I/Os Correct electrolytes as indicated  GASTROINTESTINAL A:   Protein calorie malnutrition P:   SUP: Pantoprazole per tube Continue TFs  HEMATOLOGIC A:   Anemia of critical illness without evidence of acute blood loss FOB Positive  P:  DVT px: LMWH Monitor CBC intermittently Transfuse per usual ICU guidelines (Hgb < 7.0 or acute bleeding)  INFECTIOUS A:   HIV/AIDS Parainfluenza viral pneumonitis Candidemia CMV viremia Intermittent fevers P:   Micro and as above - ID service managing, appreciate assistance Follow trach suture site > no ulceration yet, but some evolving skin breakdown. At risk infxn  ENDOCRINE A:   Steroid induced hyperglycemia P:   Cont SSI Cont Lantus  NEUROLOGIC A:    Acute encephalopathy and agitation > on 5/2 very somnolent and unable  to perform PSV, suspect due to adjusted sedation regimen.  ICU associated discomfort P:   Goal RASS 0 to -1, currently -3 Methadone - started 4/29; consider decreasing as we move forward On clonazepam > will decrease 5/2 and 5/3 after addition of VPA on 5/1 Cont quetiapine > decrease to 1074mbid 5/3 and follow MS Cont clonidine patch as adjunctive therapy Added VPA 5/01 as adjunctive therapy > decrease to 25021mid on 5/3 Cont PRN lorazepam and hydromorphone   GLOBAL: -Confirmed DNR status in event of cardiac arrest, They also understand that we would not undertake HD unless rest of clinical status much improved -will adjust sedating meds down SLOWLY as a major issue for him has been agitation affecting work to breathe  CC time 35 minutes  RobBaltazar ApoD, PhD 10/30/2013, 9:01 AM [facility] [phone] if no answer [phone]

## 2013-10-30 NOTE — Progress Notes (Signed)
Patient ID: William Bender, male   DOB: [birth year], 48 y.o.   MRN: [id number]         [facility]    Date of Admission:  10-07-2013                   Day 16 antifungal therapy Principal Problem:   Pneumonia Active Problems:   Acute respiratory failure   AIDS   PCP (pneumocystis carinii pneumonia)   CMV (cytomegalovirus)   Sacral decubitus ulcer, stage II   Hyponatremia   Pneumomediastinum   Fungemia   Spontaneous pneumothorax   Pneumothorax on right   . antiseptic oral rinse  15 mL Mouth Rinse QID  . azithromycin  1,200 mg Per Tube Weekly  . chlorhexidine  15 mL Mouth Rinse BID  . clonazepam  1.5 mg Per Tube BID  . cloNIDine  0.1 mg Transdermal Weekly  . collagenase   Topical Daily  . dolutegravir  50 mg Oral Daily  . emtricitabine-tenofovir  1 tablet Per Tube Daily  . enoxaparin (LOVENOX) injection  40 mg Subcutaneous Daily  . etomidate  40 mg Intravenous Once  . feeding supplement (OXEPA)  1,000 mL Per Tube Q24H  . feeding supplement (PRO-STAT SUGAR FREE 64)  30 mL Per Tube QID  . fluconazole (DIFLUCAN) IV  400 mg Intravenous Q24H  . free water  200 mL Per Tube Q4H  . insulin aspart  0-15 Units Subcutaneous 6 times per day  . insulin glargine  25 Units Subcutaneous Daily  . methadone  20 mg Per Tube 3 times per day  . methylPREDNISolone (SOLU-MEDROL) injection  80 mg Intravenous Q12H  . metoprolol tartrate  25 mg Per Tube BID  . multivitamin  5 mL Per Tube Daily  . pantoprazole sodium  40 mg Per Tube Daily  . QUEtiapine  100 mg Per Tube BID  . sodium chloride  10-40 mL Intracatheter Q12H  . sulfamethoxazole-trimethoprim  1 tablet Per Tube Daily  . Valproic Acid  250 mg Per Tube BID    Objective: Temp:  [97 F (36.1 C)-99.3 F (37.4 C)] 97 F (36.1 C) (05/03 0800) Pulse Rate:  [60-134] 78 (05/03 0900) Resp:  [13-57] 23 (05/03 0900) BP: (132-173)/(61-94) 162/94 mmHg (05/03 0900) SpO2:  [92 %-99 %] 96 % (05/03 0900) FiO2 (%):  [40 %]  40 % (05/03 0800) Weight:  [153 lb 10.6 oz (69.7 kg)] 153 lb 10.6 oz (69.7 kg) (05/03 0500)  General: He is sedated and resting quietly on the ventilator. Skin: No rash.  Lungs: Scattered rhonchi. Some purulent secretions around trach Cor: Regular S1-S2 no murmurs Abdomen: Soft    Lab Results Lab Results  Component Value Date   WBC 14.7* 10/30/2013   HGB 8.2* 10/30/2013   HCT 26.9* 10/30/2013   MCV 96.4 10/30/2013   PLT 277 10/30/2013    Lab Results  Component Value Date   CREATININE 0.33* 10/30/2013   BUN 22 10/30/2013   NA 142 10/30/2013   K 4.6 10/30/2013   CL 98 10/30/2013   CO2 41* 10/30/2013    Lab Results  Component Value Date   ALT 143* 10/26/2013   AST 40* 10/26/2013   ALKPHOS 74 10/26/2013   BILITOT 0.3 10/26/2013      HIV 1 RNA Quant (copies/mL)  Date Value  10/24/2013 53*  10/08/2013 756*  09/27/2013 13616*     CD4 T Cell Abs (/uL)  Date Value  10/24/2013 30*  10/08/2013 30*  09/27/2013  30*   Microbiology: Recent Results (from the past 240 hour(s))  CULTURE, RESPIRATORY (NON-EXPECTORATED)     Status: None   Collection Time    10/23/13 11:40 AM      Result Value Ref Range Status   Specimen Description TRACHEAL ASPIRATE   Final   Special Requests Immunocompromised   Final   Gram Stain     Final   Value: RARE WBC PRESENT,BOTH PMN AND MONONUCLEAR     RARE SQUAMOUS EPITHELIAL CELLS PRESENT     MODERATE GRAM POSITIVE COCCI     IN PAIRS FEW GRAM NEGATIVE RODS     RARE GRAM POSITIVE RODS   Culture     Final   Value: Non-Pathogenic Oropharyngeal-type Flora Isolated.     Performed at [facility]   Report Status 10/25/2013 FINAL   Final  CULTURE, BLOOD (ROUTINE X 2)     Status: None   Collection Time    10/23/13 11:55 AM      Result Value Ref Range Status   Specimen Description BLOOD RIGHT ARM   Final   Special Requests     Final   Value: BOTTLES DRAWN AEROBIC AND ANAEROBIC 4CC BOTH BOTTLES   Culture  Setup Time     Final   Value: 10/23/2013 18:28     Performed  at [facility]   Culture     Final   Value: NO GROWTH 5 DAYS     Performed at [facility]   Report Status 10/29/2013 FINAL   Final  URINE CULTURE     Status: None   Collection Time    10/23/13 12:02 PM      Result Value Ref Range Status   Specimen Description URINE, CATHETERIZED   Final   Special Requests Immunocompromised   Final   Culture  Setup Time     Final   Value: 10/23/2013 20:38     Performed at [facility]   Colony Count     Final   Value: NO GROWTH     Performed at [facility]   Culture     Final   Value: NO GROWTH     Performed at [facility]   Report Status 10/24/2013 FINAL   Final  CULTURE, RESPIRATORY (NON-EXPECTORATED)     Status: None   Collection Time    10/26/13  5:30 PM      Result Value Ref Range Status   Specimen Description TRACHEAL ASPIRATE   Final   Special Requests NONE   Final   Gram Stain     Final   Value: MODERATE WBC PRESENT,BOTH PMN AND MONONUCLEAR     RARE SQUAMOUS EPITHELIAL CELLS PRESENT     ABUNDANT GRAM POSITIVE COCCI IN PAIRS     ABUNDANT GRAM NEGATIVE RODS     Performed at [facility]   Culture     Final   Value: Non-Pathogenic Oropharyngeal-type Flora Isolated.     Performed at [facility]   Report Status 10/29/2013 FINAL   Final  CULTURE, BLOOD (ROUTINE X 2)     Status: None   Collection Time    10/26/13  6:25 PM      Result Value Ref Range Status   Specimen Description BLOOD RIGHT ARM   Final   Special Requests BOTTLES DRAWN AEROBIC AND ANAEROBIC 5CC   Final   Culture  Setup Time     Final   Value: 10/27/2013 01:59  Performed at [facility]   Culture     Final   Value:        BLOOD CULTURE RECEIVED NO GROWTH TO DATE CULTURE WILL BE HELD FOR 5 DAYS BEFORE ISSUING A FINAL NEGATIVE REPORT     Performed at [facility]   Report Status PENDING   Incomplete  CULTURE, BLOOD (ROUTINE X 2)     Status: None   Collection Time    10/26/13  6:31 PM       Result Value Ref Range Status   Specimen Description BLOOD RIGHT HAND   Final   Special Requests BOTTLES DRAWN AEROBIC ONLY 3CC   Final   Culture  Setup Time     Final   Value: 10/27/2013 01:59     Performed at [facility]   Culture     Final   Value:        BLOOD CULTURE RECEIVED NO GROWTH TO DATE CULTURE WILL BE HELD FOR 5 DAYS BEFORE ISSUING A FINAL NEGATIVE REPORT     Performed at [facility]   Report Status PENDING   Incomplete  CLOSTRIDIUM DIFFICILE BY PCR     Status: None   Collection Time    10/28/13  1:23 PM      Result Value Ref Range Status   C difficile by pcr NEGATIVE  NEGATIVE Final   Comment: [facility]formed [facility]    Studies/Results: Dg Chest Port 1 View  10/30/2013   CLINICAL DATA:  Airspace disease.  EXAM: PORTABLE CHEST - 1 VIEW  COMPARISON:  DG CHEST 1V PORT dated 10/28/2013  FINDINGS: Tracheostomy tube, NG tube, left PICC line, right chest tube in stable position. Mediastinum and hilar regions are stable. Heart size is stable. There is mild cardiomegaly. Persistent bilateral pulmonary alveolar infiltrates are present. These are unchanged. No pneumothorax. Subcutaneous emphysema right chest again noted. Subcutaneous emphysema is now noted in the soft tissues of the neck bilaterally.  IMPRESSION: 1. Line and tube positions stable. 2. Stable dense bilateral pulmonary infiltrates. 3. Subcutaneous emphysema again noted along the right chest wall. New onset of subcutaneous emphysema noted in the soft tissues of the neck bilaterally.   Electronically Signed   By: Maisie Fushomas  Register   On: 10/30/2013 07:13    Assessment: William Bender has been stable overnight. His fevers have been lower in the past few days. Dr. Delton CoombesByrum removed the stitches around his trach. 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 Hopefully it can be managed with local therapy. The plan is to stop full dose fluconazole  tomorrow.  Plan: 1. Continue full dose fluconazole through tomorrow then convert to a once weekly prophylactic dose  2. Continue current antiretroviral therapy and prophylaxis for pneumocystis Mycobacterium avium 3. I discussed these plans with William Bender parents  William AstersJohn Greydis Stlouis, MD [facility] [phone] pager   [phone] cell 10/30/2013, 10:09 AM

## 2013-10-30 NOTE — Progress Notes (Signed)
At about 2200 on 10/29/13, need for Foley cath was reassessed, since pt is not been diuresed, E-link MD agreed that Pt Foley be discontinued and condom cath be used instead. Condom cath was placed at about 2215, pt was unable to void till 0515. Bladder scan was done and it showed >43000ml of urine in his bladder. Dr Deterding was notified, and order was received to place a foley cath, foley cath was placed per protocol, 550ml clear yellow urine returned. Pt tolerated procedure ok.

## 2013-10-30 NOTE — Progress Notes (Signed)
CRITICAL VALUE ALERT  Critical value received:  CO2 41  Date of notification:  10/30/13  Time of notification:  0625  Critical value read back:yes  Nurse who received alert:  Reuel BoomQueeneth Coriana Angello RN  MD notified (1st page):  E-Link  Time of first page:  0631  MD notified (2nd page):  Time of second page:  Responding MD:  Dr Darrick Pennaeterding  Time MD responded:  [phone]

## 2013-10-31 ENCOUNTER — Inpatient Hospital Stay (HOSPITAL_COMMUNITY): Payer: BC Managed Care – PPO

## 2013-10-31 LAB — FUNGUS CULTURE W SMEAR: FUNGAL SMEAR: NONE SEEN

## 2013-10-31 LAB — GLUCOSE, CAPILLARY
GLUCOSE-CAPILLARY: 144 mg/dL — AB (ref 70–99)
GLUCOSE-CAPILLARY: 117 mg/dL — AB (ref 70–99)
Glucose-Capillary: 148 mg/dL — ABNORMAL HIGH (ref 70–99)
Glucose-Capillary: 162 mg/dL — ABNORMAL HIGH (ref 70–99)
Glucose-Capillary: 137 mg/dL — ABNORMAL HIGH (ref 70–99)

## 2013-10-31 LAB — BASIC METABOLIC PANEL
BUN: 22 mg/dL (ref 6–23)
CO2: 41 mEq/L (ref 19–32)
Calcium: 8.5 mg/dL (ref 8.4–10.5)
Chloride: 92 mEq/L — ABNORMAL LOW (ref 96–112)
Creatinine, Ser: 0.32 mg/dL — ABNORMAL LOW (ref 0.50–1.35)
GFR calc Af Amer: >90 mL/min (ref >90)
GFR calc non Af Amer: >90 mL/min (ref >90)
GLUCOSE: 151 mg/dL — AB (ref 70–99)
POTASSIUM: 4.4 meq/L (ref 3.7–5.3)
Sodium: 139 mEq/L (ref 137–147)

## 2013-10-31 MED ORDER — METOPROLOL TARTRATE 25 MG/10 ML ORAL SUSPENSION
12.5000 mg | Freq: Two times a day (BID) | ORAL | Status: DC
  Administered 2013-10-31 – 2013-11-04 (×9): 12.5 mg
  Filled 2013-10-31 (×11): qty 5

## 2013-10-31 MED ORDER — HYDRALAZINE HCL 20 MG/ML IJ SOLN
10.0000 mg | Freq: Four times a day (QID) | INTRAMUSCULAR | Status: DC | PRN
  Administered 2013-11-01 – 2013-11-02 (×2): 10 mg via INTRAVENOUS
  Filled 2013-10-31 (×2): qty 1

## 2013-10-31 MED ORDER — QUETIAPINE FUMARATE 100 MG PO TABS
100.0000 mg | ORAL_TABLET | Freq: Every morning | ORAL | Status: DC
  Administered 2013-11-01 – 2013-11-05 (×5): 100 mg
  Filled 2013-10-31 (×6): qty 1

## 2013-10-31 MED ORDER — QUETIAPINE FUMARATE 200 MG PO TABS
200.0000 mg | ORAL_TABLET | Freq: Every day | ORAL | Status: DC
  Administered 2013-10-31 – 2013-11-05 (×6): 200 mg
  Filled 2013-10-31 (×11): qty 1

## 2013-10-31 MED ORDER — QUETIAPINE FUMARATE 200 MG PO TABS
200.0000 mg | ORAL_TABLET | Freq: Every day | ORAL | Status: DC
  Filled 2013-10-31: qty 1

## 2013-10-31 MED ORDER — FUROSEMIDE 10 MG/ML IJ SOLN
40.0000 mg | Freq: Two times a day (BID) | INTRAMUSCULAR | Status: DC
  Administered 2013-10-31 – 2013-11-04 (×10): 40 mg via INTRAVENOUS
  Filled 2013-10-31 (×17): qty 4

## 2013-10-31 MED ORDER — METOPROLOL TARTRATE 25 MG/10 ML ORAL SUSPENSION
12.5000 mg | Freq: Once | ORAL | Status: AC
  Administered 2013-10-31: 12.5 mg
  Filled 2013-10-31: qty 5

## 2013-10-31 NOTE — Progress Notes (Addendum)
PULMONARY / CRITICAL CARE MEDICINE   Name: William Bender MRN: [id number] DOB: September 10, 1965    ADMISSION DATE:  10/06/2013 CONSULTATION DATE:  10/09/2013  REFERRING MD :  EDP  CHIEF COMPLAINT:  Shortness of breath, fever, cough  BRIEF PATIENT DESCRIPTION:  48 yo with recently diagnosis of HIV/AIDS (CD4 = 30 on 3/31) admitted 4/4 with acute hypoxemic respiratory failure and bilateral pneumonia. Of note PCP negative preadmission on Bactrim Px.  SIGNIFICANT / STUDIES EVENTS: 4/04 - Chest CTA:  No embolism, bilateral airspace disease 4/09 - Chest CT: Pneumomediastinum, small R pneumothorax, bilateral airspace disease 4/15 - RUE Venous Doppler: no DVT 4/18 - Echocardiogram: EF 60%, no RWMA 4/24 - Spontaneous R PTX > chest tube placed 4/24 - Trach tube placed 4/26 - BLE venous Dopplers: No DVT noted 4/26 - BUE venous Dopplers: No DVT noted 4/29 - placed back on IV dilaudid drip.PC increased to 25 and fio2 to 50% per float MD. 4/29 - Hgb 6.2. One unit PRBCs transfused 4/29 - [place] conference with wife, mother and father. Plan to continue full support but not escalate much from current level. Specifically discussed was possibility of ACLS in event of cardiac arrest and HD in event of acute renal failure. It was agreed that these would be excessive interventions in his current status 4/29 - started on methadone. Steroids empirically increased for non-resolving pneumonitis/ARDS. CD4 count remains < 30 4/30 - better vent synchrony with methadone started 4/29. Tolerating PS 10 cm H2O 5/01 - did not tolerate PSV 24 wean, tachypnea / agitation. Valproic acid added as adjunct for delirium  LINES / TUBES: ETT 4/5 >> 4/24 IJ CVL 4/5 >> 4/17 LUE PICC 4/17 >> 5/01 (ordered) Trach tube [name]) 4/24 >>  R chest tube 4/24 >>  LUE PICC - exchanged over wire 5/01 >>   MICRO: HIV 1 RNA Quant  3/17: 631,497  3/31: 13,616    4/11: 756  4/27: 53 AFB  BAL 4/05 >> smear neg >> NGTD Fungal BAL 4/05 >> smear neg >>  NGTD Blood 4/04 >> NEG CMV BAL 4/05 >> NEG Bact BAL 4/05 >> NEG Pneumocystis DFA 4/05 >> NEG Blood 4/11 >> NEG AFB blood cx 4/11 >> NGTD RVP 4/11 >> PARAINFLUENZA 2 Blast Abs 4/13 >> NEG Histo Abs 4/13 >> NEG Fungal BAL 4/13 >> CANDIDA ALBICANS Bact BAL 4/13 >> NEG M TB complex PCR, BAL 4/13 >> NEG CMV DNA probe (serum) 4/13 >> POSITIVE (101,862) Blood 4/16 >> 1/2 CANDIDA ALBICANS Resp 4/16 >> NEG Urine 4/16  >> NEG Resp 4/20 >> NEG Blood 4/20 >> NEG Resp 422 >> NEG Blood 4/22 >> NEG C diff 4/23 >> NEG Urine 4/26 >> NEG Resp 4/26 >> NOF Blood 4/26 >>  Resp 4/29 >> abundant GPC, abundant GNR >> NOF Blood 4/29 >>  C diff 5/1 >> negative   ANTIBIOTICS: (all abx per ID) HAART Vanc 4/04 >> off Cefepime 4/04 >> off Micafungin 4/17 >> 4/24 Gancyclovir 4/6 >> 5/01 Fluconazole (fungemia) 4/24 >> 5/4 Fluconazole (px dosing) 5/4 >>  Azithromycin (MAC px) 4/19 >>  TMP-SMX (px dosage) 4/04 >>    SUBJECTIVE:   Seroquel and valproate decreased 5/3, a bit more active, certainly not awake  VITAL SIGNS: Temp:  [97.4 F (36.3 C)-98.9 F (37.2 C)] 98.5 F (36.9 C) (05/04 0400) Pulse Rate:  [55-109] 61 (05/04 0800) Resp:  [12-39] 24 (05/04 0800) BP: (123-177)/(55-113) 151/84 mmHg (05/04 0800) SpO2:  [93 %-97 %] 95 % (05/04 0800) FiO2 (%):  [  40 %] 40 % (05/04 0800) Weight:  [65.8 kg (145 lb 1 oz)] 65.8 kg (145 lb 1 oz) (05/04 0400)  VENTILATOR SETTINGS: Vent Mode:  [-] PCV FiO2 (%):  [40 %] 40 % Set Rate:  [25 bmp] 25 bmp PEEP:  [8 cmH20] 8 cmH20 Pressure Support:  [23 cmH20] 23 cmH20 Plateau Pressure:  [20 cmH20-26 cmH20] 23 cmH20  INTAKE / OUTPUT: Intake/Output     05/03 0701 - 05/04 0700 05/04 0701 - 05/05 0700   I.V. (mL/kg) 480 (7.3) 20 (0.3)   NG/GT 2380 45   IV Piggyback 200    Total Intake(mL/kg) 3060 (46.5) 65 (1)   Urine (mL/kg/hr) 1885 (1.2) 250 (1.5)   Total Output 1885 250   Net +1175 -185         PHYSICAL EXAMINATION: Gen: vented, RASS -3,  cachectic HEENT: Secretions on trach dressing PULM: on PCV, scattered rhonchi, no air leak chest tube CV: regular, no murmur AB: BS+, soft Ext: warm no edema Neuro: moving spontaneously, not able to interact  LABS:  Recent Labs Lab 10/26/13 0430 10/29/13 0638  PHART 7.449 7.386  PCO2ART 47.6* 60.2*  PO2ART 77.8* 88.9  HCO3 32.1* 35.0*  TCO2 30.4 33.2  O2SAT 94.9 96.6    Recent Labs Lab 10/28/13 0545 10/29/13 0620 10/30/13 0540  HGB 7.9* 7.8* 8.2*  HCT 24.9* 26.0* 26.9*  WBC 9.9 9.5 14.7*  PLT 231 226 277    Recent Labs Lab 10/27/13 0455 10/28/13 0545 10/29/13 0620 10/30/13 0540 10/31/13 0457  NA 141 142 144 142 139  K 4.6 4.3 5.1 4.6 4.4  CL 104 102 102 98 92*  CO2 30 35* 38* 41* 41*  GLUCOSE 164* 147* 152* 160* 151*  BUN 19 28* 24* 22 22  CREATININE 0.33* 0.36* 0.37* 0.33* 0.32*  CALCIUM 7.9* 8.3* 8.4 8.1* 8.5  MG  --   --   --  2.2  --   PHOS  --   --   --  2.8  --     Recent Labs Lab 10/27/13 0610  INR 1.21    Recent Labs Lab 10/26/13 0600 10/27/13 0610  AST 40*  --   ALT 143*  --   ALKPHOS 74  --   BILITOT 0.3  --   PROT 4.4*  --   ALBUMIN 1.4*  --   INR  --  1.21    Dg Chest Port 1 View  10/31/2013   NAME CLINICAL DATA: Faxed.  EXAM: PORTABLE CHEST - 1 VIEW  COMPARISON:  Chest x-ray from yesterday  FINDINGS: A feeding tube remains coiled in the stomach. There is a left upper extremity PICC, tip in the region of the mid SVC. Tracheostomy tube and right chest tube in similar position.  Unchanged heart size and mediastinal contours. Diffuse lung opacification is without progression. No increasing pleural fluid. No visible pneumothorax. Subcutaneous emphysema at the thoracic inlet appears similar to previous.  IMPRESSION: 1. Unchanged positioning of tubes and lines. The feeding tube ends in the proximal stomach. 2. No visible pneumothorax. Stable subcutaneous emphysema in the right chest wall and thoracic inlet. 3. Unchanged diffuse airspace  disease.   Electronically Signed   By: Jorje Guild M.D.   On: 10/31/2013 05:31   Dg Chest Port 1 View  10/30/2013   CLINICAL DATA:  Airspace disease.  EXAM: PORTABLE CHEST - 1 VIEW  COMPARISON:  DG CHEST 1V PORT dated 10/28/2013  FINDINGS: Tracheostomy tube, NG tube, left PICC line, right  chest tube in stable position. Mediastinum and hilar regions are stable. Heart size is stable. There is mild cardiomegaly. Persistent bilateral pulmonary alveolar infiltrates are present. These are unchanged. No pneumothorax. Subcutaneous emphysema right chest again noted. Subcutaneous emphysema is now noted in the soft tissues of the neck bilaterally.  IMPRESSION: 1. Line and tube positions stable. 2. Stable dense bilateral pulmonary infiltrates. 3. Subcutaneous emphysema again noted along the right chest wall. New onset of subcutaneous emphysema noted in the soft tissues of the neck bilaterally.   Electronically Signed   By: Marcello Moores  Register   On: 10/30/2013 07:13   CXR: 5/1 - ARDS pattern,  No residual ptx  ASSESSMENT / PLAN:  PULMONARY A:  Prolonged VDRF ARDS - suspect steroid responsive component Parainfluenza virus PNA  Pneumomediastinum, resolved Spontaneous R pneumothorax Tracheostomy status, ? Some evidence trach suture skin breakdown Vent dyssynchrony P:   Vent settings reviewed - PCV 23, r 25, peep 8, 40% > plan decrease PEEP to 5 on 5/4 Cont vent bundle Cont chest tube on water seal Need to start diuresing as he can tolerate - 10 L positive for hosp as of 5/4; 35m IV bid and follow Freq CXR as long as chest tube in place; would be risky to remove when he is requiring high vent pressures Increased systemic steroids 4/29 > 859mq 12h, he has not historically tolerated decrease of steroids May be getting close to place where he could / [facility]   CARDIOVASCULAR A:  Intermittent sinus tachycardia, reactive (fever, pain, etc) Abnormal EKG, resolved Hypertension,  controlled P:  Cont to monitor BP and rhythm  RENAL A:   Hypernatremia, resolved Met alkalosis, mild P:   Monitor BMET intermittently Monitor I/Os Correct electrolytes as indicated  GASTROINTESTINAL A:   Protein calorie malnutrition P:   SUP: Pantoprazole per tube Continue TFs  HEMATOLOGIC A:   Anemia of critical illness without evidence of acute blood loss FOB Positive  P:  DVT px: LMWH Monitor CBC intermittently Transfuse per usual ICU guidelines (Hgb < 7.0 or acute bleeding)  INFECTIOUS A:   HIV/AIDS Parainfluenza viral pneumonitis Candidemia CMV viremia Intermittent fevers P:   Micro and as above - ID service managing, appreciate assistance Follow trach suture site > no ulceration yet, but some evolving skin breakdown. At risk infxn  ENDOCRINE A:   Steroid induced hyperglycemia P:   Cont SSI Cont Lantus  NEUROLOGIC A:    Acute encephalopathy and agitation > on 5/2 very somnolent and unable to perform PSV, suspect due to adjusted sedation regimen.  ICU associated discomfort P:   Goal RASS 0 to -1, currently -3 Methadone - started 4/29; consider decreasing as we move forward On clonazepam > decreased after addition of VPA on 5/1 Cont quetiapine > change to 10083mam, 200 qpm 5/4 and follow MS Cont clonidine patch as adjunctive therapy Added VPA 5/01 as adjunctive therapy > decrease to 250m36md on 5/3 Cont PRN lorazepam and hydromorphone   GLOBAL: -Confirmed DNR status in event of cardiac arrest, They also understand that we would not undertake HD unless rest of clinical status much improved -will adjust sedating meds down SLOWLY as a major issue for him has been agitation affecting work to breathe  CC time 45 minutes  RobeBaltazar Apo, PhD 10/31/2013, 9:34 AM [facility] [phone] no answer [phone]

## 2013-10-31 NOTE — Progress Notes (Signed)
[facility]  Date of Admission:  10/12/2013  Antibiotics: Tivicay/Truvada Bactrim Prophylaxis Azithromycin prophylaxis Fluconazole day 17  Subjective: No change, trying to wean vent, sedation  Objective: Temp:  [97.4 F (36.3 C)-98.9 F (37.2 C)] 98.5 F (36.9 C) (05/04 0400) Pulse Rate:  [55-109] 61 (05/04 0800) Resp:  [12-39] 24 (05/04 0800) BP: (123-177)/(55-113) 151/84 mmHg (05/04 0800) SpO2:  [93 %-97 %] 95 % (05/04 0800) FiO2 (%):  [40 %] 40 % (05/04 0945) Weight:  [145 lb 1 oz (65.8 kg)] 145 lb 1 oz (65.8 kg) (05/04 0400)  General: sedated, unresponsive Skin: no rash Lungs: diffuse rhonchi Cor: RR Abdomen: soft, nt, nd, +bs Ext: no edema  Lab Results Lab Results  Component Value Date   WBC 14.7* 10/30/2013   HGB 8.2* 10/30/2013   HCT 26.9* 10/30/2013   MCV 96.4 10/30/2013   PLT 277 10/30/2013    Lab Results  Component Value Date   CREATININE 0.32* 10/31/2013   BUN 22 10/31/2013   NA 139 10/31/2013   K 4.4 10/31/2013   CL 92* 10/31/2013   CO2 41* 10/31/2013    Lab Results  Component Value Date   ALT 143* 10/26/2013   AST 40* 10/26/2013   ALKPHOS 74 10/26/2013   BILITOT 0.3 10/26/2013      Microbiology: Recent Results (from the past 240 hour(s))  CULTURE, RESPIRATORY (NON-EXPECTORATED)     Status: None   Collection Time    10/23/13 11:40 AM      Result Value Ref Range Status   Specimen Description TRACHEAL ASPIRATE   Final   Special Requests Immunocompromised   Final   Gram Stain     Final   Value: RARE WBC PRESENT,BOTH PMN AND MONONUCLEAR     RARE SQUAMOUS EPITHELIAL CELLS PRESENT     MODERATE GRAM POSITIVE COCCI     IN PAIRS FEW GRAM NEGATIVE RODS     RARE GRAM POSITIVE RODS   Culture     Final   Value: Non-Pathogenic Oropharyngeal-type Flora Isolated.     Performed at [facility]   Report Status 10/25/2013 FINAL   Final  CULTURE, BLOOD (ROUTINE X 2)     Status: None   Collection Time    10/23/13 11:55 AM      Result  Value Ref Range Status   Specimen Description BLOOD RIGHT ARM   Final   Special Requests     Final   Value: BOTTLES DRAWN AEROBIC AND ANAEROBIC 4CC BOTH BOTTLES   Culture  Setup Time     Final   Value: 10/23/2013 18:28     Performed at [facility]   Culture     Final   Value: NO GROWTH 5 DAYS     Performed at [facility]   Report Status 10/29/2013 FINAL   Final  URINE CULTURE     Status: None   Collection Time    10/23/13 12:02 PM      Result Value Ref Range Status   Specimen Description URINE, CATHETERIZED   Final   Special Requests Immunocompromised   Final   Culture  Setup Time     Final   Value: 10/23/2013 20:38     Performed at [facility] Count     Final   Value: NO GROWTH     Performed at [facility]   Culture     Final   Value: NO GROWTH  [facility]   Report Status 10/24/2013 FINAL   Final  CULTURE, RESPIRATORY (NON-EXPECTORATED)     Status: None   Collection Time    10/26/13  5:30 PM      Result Value Ref Range Status   Specimen Description TRACHEAL ASPIRATE   Final   Special Requests NONE   Final   Gram Stain     Final   Value: MODERATE WBC PRESENT,BOTH PMN AND MONONUCLEAR     RARE SQUAMOUS EPITHELIAL CELLS PRESENT     ABUNDANT GRAM POSITIVE COCCI IN PAIRS     ABUNDANT GRAM NEGATIVE RODS     [facility]   Culture     Final   Value: Non-Pathogenic Oropharyngeal-type Flora Isolated.     [facility]formed at [facility]   Report Status 10/29/2013 FINAL   Final  CULTURE, BLOOD (ROUTINE X 2)     Status: None   Collection Time    10/26/13  6:25 PM      Result Value Ref Range Status   Specimen Description BLOOD RIGHT ARM   Final   Special Requests BOTTLES DRAWN AEROBIC AND ANAEROBIC 5CC   Final   Culture  Setup Time     Final   Value: 10/27/2013 01:59     [facility]formed [facility]   Culture     Final   Value:        BLOOD CULTURE RECEIVED NO GROWTH TO DATE  CULTURE WILL BE HELD FOR 5 DAYS BEFORE ISSUING A FINAL NEGATIVE REPORT     [facility]formed [facility]   Report Status PENDING   Incomplete  CULTURE, BLOOD (ROUTINE X 2)     Status: None   Collection Time    10/26/13  6:31 PM      Result Value Ref Range Status   Specimen Description BLOOD RIGHT HAND   Final   Special Requests BOTTLES DRAWN AEROBIC ONLY 3CC   Final   Culture  Setup Time     Final   Value: 10/27/2013 01:59     [facility]formed [facility]   Culture     Final   Value:        BLOOD CULTURE RECEIVED NO GROWTH TO DATE CULTURE WILL BE HELD FOR 5 DAYS BEFORE ISSUING A FINAL NEGATIVE REPORT     [facility]   Report Status PENDING   Incomplete  CLOSTRIDIUM DIFFICILE BY PCR     Status: None   Collection Time    10/28/13  1:23 PM      Result Value Ref Range Status   C difficile by pcr NEGATIVE  NEGATIVE Final   Comment: [facility]    Studies/Results: Dg Chest Port 1 View  10/31/2013   NAME CLINICAL DATA: Faxed.  EXAM: PORTABLE CHEST - 1 VIEW  COMPARISON:  Chest x-ray from yesterday  FINDINGS: A feeding tube remains coiled in the stomach. There is a left upper extremity PICC, tip in the region of the mid SVC. Tracheostomy tube and right chest tube in similar position.  Unchanged heart size and mediastinal contours. Diffuse lung opacification is without progression. No increasing pleural fluid. No visible pneumothorax. Subcutaneous emphysema at the thoracic inlet appears similar to previous.  IMPRESSION: 1. Unchanged positioning of tubes and lines. The feeding tube ends in the proximal stomach. 2. No visible pneumothorax. Stable subcutaneous emphysema in the right chest wall and thoracic inlet. 3. Unchanged  diffuse airspace disease.   Electronically Signed   By: Tiburcio PeaJonathan  Watts M.D.   On: 10/31/2013 05:31   Dg Chest Port 1 View  10/30/2013   CLINICAL DATA:  Airspace disease.  EXAM: PORTABLE CHEST - 1 VIEW  COMPARISON:  DG CHEST 1V  PORT dated 10/28/2013  FINDINGS: Tracheostomy tube, NG tube, left PICC line, right chest tube in stable position. Mediastinum and hilar regions are stable. Heart size is stable. There is mild cardiomegaly. Persistent bilateral pulmonary alveolar infiltrates are present. These are unchanged. No pneumothorax. Subcutaneous emphysema right chest again noted. Subcutaneous emphysema is now noted in the soft tissues of the neck bilaterally.  IMPRESSION: 1. Line and tube positions stable. 2. Stable dense bilateral pulmonary infiltrates. 3. Subcutaneous emphysema again noted along the right chest wall. New onset of subcutaneous emphysema noted in the soft tissues of the neck bilaterally.   Electronically Signed   By: Maisie Fushomas  Register   On: 10/30/2013 07:13    Assessment/Plan: 1) Respiratory failure - trying to wean.    2) HIV/AIDS - on appropriate meds, prophylaxis.    3)  Fungemia - finished therapy. Will change to weekly fluconazole.    Discussed with patients mother at bedside.    Gardiner Barefootobert W Comer, MD [facility] [email or web address] [phone] pager   [phone] cell 10/31/2013, 10:53 AM

## 2013-10-31 NOTE — Progress Notes (Signed)
CARE MANAGEMENT NOTE 10/31/2013  Patient:  William Bender,William Bender   Account Number:  [id number]  Date Initiated:  10/03/2013  Documentation initiated by:  Roddie Riegler  Subjective/Objective Assessment:   pt admitted on 10/19/2013/required intubation04052015/hcap and failed outpt treatment moved to icu on [id number].     Action/Plan:   tbd, will follow and see what needs are present once extubated and awake.   Anticipated DC Date:  11/03/2013   Anticipated DC Plan:  LONG TERM ACUTE CARE (LTAC)  In-house referral  NA      DC Planning Services  NA      PAC Choice  NA   Choice offered to / List presented to:  NA   DME arranged  NA      DME agency  NA     HH arranged  NA      HH agency  NA   Status of service:  In process, will continue to follow Medicare Important Message given?  NA - LOS <3 / Initial given by admissions (If response is "NO", the following Medicare IM given date fields will be blank) Date Medicare IM given:   Date Additional Medicare IM given:    Discharge Disposition:    Per UR Regulation:  Reviewed for med. necessity/level of care/duration of stay  If discussed at Long Length of Stay Meetings, dates discussed:   10/06/2013  10/11/2013  10/13/2013  10/18/2013  10/20/2013  10/25/2013  10/27/2013    Comments:  [id number]/William Bender Earlene Plateravis, RN, BSN, CCM, [phone] Chart reviewed for update of needs and condition./ still considering ltach as disposition but must failed three different attempts to wean from vent.  [id number]/William Bender Earlene Plateravis, RN, BSN, CCM [phone] Chart Reviewed for discharge and hospital needs. Discharge needs at time of review: None present will follow for needs. Review of patient progress due on [id number].   [id number]/William Bender Earlene Plateravis, RN, BSN, CCM, [phone] Chart reviewed for update of needs and condition./ Dr.Simons spoke at length with Father of son, family meeting for Wednesday, was trached on [id number] remains vent  dependent.   [id number]/William Bender Earlene Plateravis, RN, BSN, CCM [phone] Chart Reviewed for discharge and hospital needs. REMAINS ON FULL VENT SUPPORT/ IV SEDATION/ POSS TRACH AND PEG TUBE TODAY OR TOMORROW/FAMILY ATTEMPTING TO MAKE A DECISION CONCERNING FUTURE CONDITION AND CARE-SNF WITH TRACH AND PEG OR TERMINAL WEAN/ Discharge needs at time of review: None present will follow for needs. Review of patient progress due on [id number].   [id number]/William Bender Earlene Plateravis, RN, BSN, CCM, [phone] Chart reviewed for update of needs and condition./ remains on full vent support, and sedated due to agitiation/poss conversion to trach later this week, failed weaning attempts/pt will need ltach palcement once trached and has failed three attempts to wean/  [id number]/William Bender Earlene Plateravis, RN, BSN, [place] [phone] Chart Reviewed for discharge and hospital needs. remains vent dependant/ards,multiple critical medical problems. Discharge needs at time of review: None present will follow for needs. Review of patient progress due on [id number].   [id number]/William Bender Earlene PlaterDavis, RN, BSN, [place] [phone] Chart Reviewed for discharge and hospital needs. Discharge needs at time of review: None present will follow for needs. Review of patient progress due on [id number].   [id number]/William Bender Earlene PlaterDavis, RN, BSN, [place] [phone] Chart Reviewed for discharge and hospital needs. Discharge needs at time of review: None present will follow for needs. Review of patient progress due on [id number]. vent day4   [id number]/William Bender Earlene PlaterDavis, RN, BSN, [place] [phone] Chart Reviewed for discharge and hospital needs. Discharge needs at time of review: None present will follow for needs. Review  of patient progress due on [id number].

## 2013-10-31 NOTE — Progress Notes (Signed)
10/31/13 1200  Clinical Encounter Type  Visited With Family (mom Cordelia PenSherry)  Visit Type Follow-up;Spiritual support;Social support  Spiritual Encounters  Spiritual Needs Emotional;Prayer   Made lengthy visit with mom Cordelia PenSherry as she used the opportunity well to share and process her reflections, hopes, concerns, and plans.  She and Louanne SkyeDick plan to return home to [place] on May 15 for ca ten days to take care of logistical needs and then to [place] for continued support of Kipp BroodBrent and family.  Kipp BroodBrent and Caryn's daughter Lillia AbedLindsay, [place]17, has her AP exams this week.  Per Cordelia PenSherry, family used the sunny weekend to get much-needed time outdoors and continues to work on self-care balance.  Why following for support, but please also page as needs arise.  Thank you.  [address]Chaplain Yoceline Bazar [place], [place] [phone]

## 2013-11-01 ENCOUNTER — Inpatient Hospital Stay (HOSPITAL_COMMUNITY): Payer: BC Managed Care – PPO

## 2013-11-01 DIAGNOSIS — J9383 Other pneumothorax: Secondary | ICD-10-CM

## 2013-11-01 LAB — BLOOD GAS, ARTERIAL
Acid-Base Excess: 16.1 mmol/L — ABNORMAL HIGH (ref 0.0–2.0)
BICARBONATE: 43 meq/L — AB (ref 20.0–24.0)
FIO2: 0.4 %
O2 Saturation: 93.2 %
PCO2 ART: 69.4 mmHg — AB (ref 35.0–45.0)
PEEP/CPAP: 5 cmH2O
PO2 ART: 71.2 mmHg — AB (ref 80.0–100.0)
PRESSURE CONTROL: 23 cmH2O
Patient temperature: 98.6
RATE: 25 resp/min
TCO2: 40.3 mmol/L (ref 0–100)
pH, Arterial: 7.409 (ref 7.350–7.450)

## 2013-11-01 LAB — HEPATIC FUNCTION PANEL
ALBUMIN: 1.8 g/dL — AB (ref 3.5–5.2)
ALT: 69 U/L — ABNORMAL HIGH (ref 0–53)
AST: 19 U/L (ref 0–37)
Alkaline Phosphatase: 80 U/L (ref 39–117)
TOTAL PROTEIN: 5.1 g/dL — AB (ref 6.0–8.3)
Total Bilirubin: 0.2 mg/dL — ABNORMAL LOW (ref 0.3–1.2)

## 2013-11-01 LAB — GLUCOSE, CAPILLARY
GLUCOSE-CAPILLARY: 120 mg/dL — AB (ref 70–99)
GLUCOSE-CAPILLARY: 136 mg/dL — AB (ref 70–99)
GLUCOSE-CAPILLARY: 138 mg/dL — AB (ref 70–99)
Glucose-Capillary: 160 mg/dL — ABNORMAL HIGH (ref 70–99)
Glucose-Capillary: 162 mg/dL — ABNORMAL HIGH (ref 70–99)

## 2013-11-01 LAB — BASIC METABOLIC PANEL
BUN: 24 mg/dL — ABNORMAL HIGH (ref 6–23)
CO2: 43 mEq/L (ref 19–32)
Calcium: 8.4 mg/dL (ref 8.4–10.5)
Chloride: 90 mEq/L — ABNORMAL LOW (ref 96–112)
Creatinine, Ser: 0.36 mg/dL — ABNORMAL LOW (ref 0.50–1.35)
GFR calc non Af Amer: >90 mL/min (ref >90)
Glucose, Bld: 171 mg/dL — ABNORMAL HIGH (ref 70–99)
POTASSIUM: 4.5 meq/L (ref 3.7–5.3)
SODIUM: 138 meq/L (ref 137–147)

## 2013-11-01 LAB — PHOSPHORUS: PHOSPHORUS: 3.7 mg/dL (ref 2.3–4.6)

## 2013-11-01 LAB — CBC
HCT: 27.4 % — ABNORMAL LOW (ref 39.0–52.0)
Hemoglobin: 8.5 g/dL — ABNORMAL LOW (ref 13.0–17.0)
MCH: 29.1 pg (ref 26.0–34.0)
MCHC: 31 g/dL (ref 30.0–36.0)
MCV: 93.8 fL (ref 78.0–100.0)
PLATELETS: 275 10*3/uL (ref 150–400)
RBC: 2.92 MIL/uL — AB (ref 4.22–5.81)
RDW: 17.7 % — AB (ref 11.5–15.5)
WBC: 14.9 10*3/uL — AB (ref 4.0–10.5)

## 2013-11-01 LAB — FUNGUS CULTURE W SMEAR: FUNGAL SMEAR: NONE SEEN

## 2013-11-01 LAB — MAGNESIUM: Magnesium: 2 mg/dL (ref 1.5–2.5)

## 2013-11-01 MED ORDER — METHADONE HCL 10 MG/ML PO CONC
15.0000 mg | Freq: Three times a day (TID) | ORAL | Status: DC
  Administered 2013-11-01 – 2013-11-05 (×12): 15 mg
  Filled 2013-11-01 (×12): qty 2

## 2013-11-01 MED ORDER — FLUCONAZOLE IN SODIUM CHLORIDE 200-0.9 MG/100ML-% IV SOLN: 200.0000 mg | INTRAVENOUS | Status: DC

## 2013-11-01 MED ORDER — FLUCONAZOLE 40 MG/ML PO SUSR
200.0000 mg | ORAL | Status: DC
  Filled 2013-11-01: qty 5

## 2013-11-01 NOTE — Progress Notes (Addendum)
Pt fail SBT 20/5 no pt effort. When suction pt HR will go up in the 140's.

## 2013-11-01 NOTE — Progress Notes (Signed)
PULMONARY / CRITICAL CARE MEDICINE   Name: William Bender MRN: [id number] DOB: 06-Sep-1965    ADMISSION DATE:  10/20/2013 CONSULTATION DATE:  10/04/2013  REFERRING MD :  EDP  CHIEF COMPLAINT:  Shortness of breath, fever, cough  BRIEF PATIENT DESCRIPTION:  48 yo with recently diagnosis of HIV/AIDS (CD4 = 30 on 3/31) admitted 4/4 with acute hypoxemic respiratory failure and bilateral pneumonia. Of note PCP negative preadmission on Bactrim Px.  SIGNIFICANT / STUDIES EVENTS: 4/04 - Chest CTA:  No embolism, bilateral airspace disease 4/09 - Chest CT: Pneumomediastinum, small R pneumothorax, bilateral airspace disease 4/15 - RUE Venous Doppler: no DVT 4/18 - Echocardiogram: EF 60%, no RWMA 4/24 - Spontaneous R PTX > chest tube placed 4/24 - Trach tube placed 4/26 - BLE venous Dopplers: No DVT noted 4/26 - BUE venous Dopplers: No DVT noted 4/29 - placed back on IV dilaudid drip.PC increased to 25 and fio2 to 50% per float MD. 4/29 - Hgb 6.2. One unit PRBCs transfused 4/29 - [place] conference with wife, mother and father. Plan to continue full support but not escalate much from current level. Specifically discussed was possibility of ACLS in event of cardiac arrest and HD in event of acute renal failure. It was agreed that these would be excessive interventions in his current status 4/29 - started on methadone. Steroids empirically increased for non-resolving pneumonitis/ARDS. CD4 count remains < 30 4/30 - better vent synchrony with methadone started 4/29. Tolerating PS 10 cm H2O 5/01 - did not tolerate PSV 24 wean, tachypnea / agitation. Valproic acid added as adjunct for delirium 5/05 - periods of intermittent sedation / agitation.no weaning today due to poor effort.  Thick secretions    LINES / TUBES: ETT 4/5 >> 4/24 IJ CVL 4/5 >> 4/17 LUE PICC 4/17 >> 5/01 (ordered) Trach tube [name]) 4/24 >>  R chest tube 4/24 >>  LUE PICC - exchanged over wire 5/01 >>   MICRO: HIV 1 RNA Quant  3/17:  094,709  3/31: 13,616    4/11: 756  4/27: 53 AFB  BAL 4/05 >> smear neg >> NGTD Fungal BAL 4/05 >> smear neg >> NGTD Blood 4/04 >> NEG CMV BAL 4/05 >> NEG Bact BAL 4/05 >> NEG Pneumocystis DFA 4/05 >> NEG Blood 4/11 >> NEG AFB blood cx 4/11 >> NGTD RVP 4/11 >> PARAINFLUENZA 2 Blast Abs 4/13 >> NEG Histo Abs 4/13 >> NEG Fungal BAL 4/13 >> CANDIDA ALBICANS Bact BAL 4/13 >> NEG M TB complex PCR, BAL 4/13 >> NEG CMV DNA probe (serum) 4/13 >> POSITIVE (101,862) Blood 4/16 >> 1/2 CANDIDA ALBICANS Resp 4/16 >> NEG Urine 4/16  >> NEG Resp 4/20 >> NEG Blood 4/20 >> NEG Resp 422 >> NEG Blood 4/22 >> NEG C diff 4/23 >> NEG Urine 4/26 >> NEG Resp 4/26 >> NOF Blood 4/26 >> neg Resp 4/29 >> abundant GPC, abundant GNR >> NOF Blood 4/29 >>  C diff 5/1 >> negative   ANTIBIOTICS: (all abx per ID) HAART Vanc 4/04 >> off Cefepime 4/04 >> off Micafungin 4/17 >> 4/24 Gancyclovir 4/6 >> 5/01 Fluconazole (fungemia) 4/24 >> 5/4 Fluconazole (px dosing) 5/4 >>  Azithromycin (MAC px) 4/19 >>  TMP-SMX (px dosage) 4/04 >>    SUBJECTIVE:  RT reports thick secretions, no weaning due to poor resp effort  VITAL SIGNS: Temp:  [97.5 F (36.4 C)-100.1 F (37.8 C)] 98.8 F (37.1 C) (05/05 0400) Pulse Rate:  [63-131] 63 (05/05 0800) Resp:  [14-56] 19 (05/05 0800) BP: (  112-176)/(58-116) 144/70 mmHg (05/05 0800) SpO2:  [92 %-100 %] 96 % (05/05 0800) FiO2 (%):  [40 %] 40 % (05/05 0847) Weight:  [141 lb 12.1 oz (64.3 kg)] 141 lb 12.1 oz (64.3 kg) (05/05 0500)  VENTILATOR SETTINGS: Vent Mode:  [-] PCV FiO2 (%):  [40 %] 40 % Set Rate:  [25 bmp] 25 bmp PEEP:  [5 cmH20-25 cmH20] 5 cmH20 Pressure Support:  [20 cmH20] 20 cmH20 Plateau Pressure:  [17 cmH20-31 cmH20] 31 cmH20  INTAKE / OUTPUT: Intake/Output     05/04 0701 - 05/05 0700 05/05 0701 - 05/06 0700   I.V. (mL/kg) 290 (4.5) 10 (0.2)   NG/GT 1880    IV Piggyback 200    Total Intake(mL/kg) 2370 (36.9) 10 (0.2)   Urine (mL/kg/hr) 2860  (1.9) 175 (1.4)   Chest Tube 130 (0.1)    Total Output 2990 175   Net -620 -165        Stool Occurrence 1 x     PHYSICAL EXAMINATION: Gen: vented, RASS -2, cachectic HEENT: #6 trach midline, creamy secretions on trach dressing PULM: on PCV, scattered rhonchi, no air leak chest tube / to water seal CV: regular, no murmur AB: BS+, soft Ext: warm no edema Neuro: moving spontaneously, not able to interact  LABS:  Recent Labs Lab 10/26/13 0430 10/29/13 0638 11/01/13 0632  PHART 7.449 7.386 7.409  PCO2ART 47.6* 60.2* 69.4*  PO2ART 77.8* 88.9 71.2*  HCO3 32.1* 35.0* 43.0*  TCO2 30.4 33.2 40.3  O2SAT 94.9 96.6 93.2    Recent Labs Lab 10/29/13 0620 10/30/13 0540 11/01/13 0535  HGB 7.8* 8.2* 8.5*  HCT 26.0* 26.9* 27.4*  WBC 9.5 14.7* 14.9*  PLT 226 277 275    Recent Labs Lab 10/28/13 0545 10/29/13 0620 10/30/13 0540 10/31/13 0457 11/01/13 0535  NA 142 144 142 139 138  K 4.3 5.1 4.6 4.4 4.5  CL 102 102 98 92* 90*  CO2 35* 38* 41* 41* 43*  GLUCOSE 147* 152* 160* 151* 171*  BUN 28* 24* 22 22 24*  CREATININE 0.36* 0.37* 0.33* 0.32* 0.36*  CALCIUM 8.3* 8.4 8.1* 8.5 8.4  MG  --   --  2.2  --  2.0  PHOS  --   --  2.8  --  3.7    Recent Labs Lab 10/27/13 0610  INR 1.21    Recent Labs Lab 10/26/13 0600 10/27/13 0610 11/01/13 0535  AST 40*  --  19  ALT 143*  --  69*  ALKPHOS 74  --  80  BILITOT 0.3  --  0.2*  PROT 4.4*  --  5.1*  ALBUMIN 1.4*  --  1.8*  INR  --  1.21  --     Dg Chest Port 1 View  11/01/2013   CLINICAL DATA:  Assess pneumothorax.  EXAM: PORTABLE CHEST - 1 VIEW  COMPARISON:  10/31/2013.  FINDINGS: Tubes and lines remaining good position. Bilateral airspace opacities persist. Unchanged tracheostomy.  Poorly seen on yesterday's film, but probably present, is a tiny right apical pneumothorax (arrow). No similar left-sided abnormality.  IMPRESSION: No change aeration. Bilateral airspace opacities persist. Stable tubes and lines.  Small right  apical pneumothorax.   Electronically Signed   By: Rolla Flatten M.D.   On: 11/01/2013 07:10   Dg Chest Port 1 View  10/31/2013   NAME CLINICAL DATA: Faxed.  EXAM: PORTABLE CHEST - 1 VIEW  COMPARISON:  Chest x-ray from yesterday  FINDINGS: A feeding tube remains coiled in the  stomach. There is a left upper extremity PICC, tip in the region of the mid SVC. Tracheostomy tube and right chest tube in similar position.  Unchanged heart size and mediastinal contours. Diffuse lung opacification is without progression. No increasing pleural fluid. No visible pneumothorax. Subcutaneous emphysema at the thoracic inlet appears similar to previous.  IMPRESSION: 1. Unchanged positioning of tubes and lines. The feeding tube ends in the proximal stomach. 2. No visible pneumothorax. Stable subcutaneous emphysema in the right chest wall and thoracic inlet. 3. Unchanged diffuse airspace disease.   Electronically Signed   By: Jorje Guild M.D.   On: 10/31/2013 05:31   CXR: 5/5 - ARDS pattern,  Small R ptx  ASSESSMENT / PLAN:  PULMONARY A:  Prolonged VDRF ARDS - suspect steroid responsive component Parainfluenza virus PNA  Pneumomediastinum, resolved Spontaneous R pneumothorax Tracheostomy status, ? Some evidence trach suture skin breakdown Vent dyssynchrony P:   Vent settings reviewed - PCV 23, r 25, peep 8, 40% > plan decrease PEEP to 5 on 5/4 Cont vent bundle Cont chest tube on water seal Diuresis as he can tolerate - 10 L positive for hosp as of 5/4; 39m IV bid and follow Freq CXR as long as chest tube in place; would be risky to remove when he is requiring high vent pressures Increased systemic steroids 4/29 > 86mq 12h, he has not historically tolerated decrease of steroids May be getting close to place where he could / [facility]   CARDIOVASCULAR A:  Intermittent sinus tachycardia, reactive (fever, pain, etc) Abnormal EKG, resolved Hypertension, controlled P:  Cont to  monitor BP and rhythm  RENAL A:   Hypernatremia, resolved Met alkalosis, mild P:   Monitor BMET intermittently Monitor I/Os Correct electrolytes as indicated  GASTROINTESTINAL A:   Protein calorie malnutrition P:   SUP: Pantoprazole per tube Continue TFs  HEMATOLOGIC A:   Anemia of critical illness without evidence of acute blood loss FOB Positive  P:  DVT px: LMWH Monitor CBC intermittently Transfuse per usual ICU guidelines (Hgb < 7.0 or acute bleeding)  INFECTIOUS A:   HIV/AIDS Parainfluenza viral pneumonitis Candidemia CMV viremia Intermittent fevers P:   Micro and as above - ID service managing, appreciate assistance Follow trach suture site > no ulceration yet, but some evolving skin breakdown. At risk infxn  ENDOCRINE A:   Steroid induced hyperglycemia P:   Cont SSI Cont Lantus  NEUROLOGIC A:    Acute encephalopathy and agitation > on 5/2 very somnolent and unable to perform PSV, suspect due to adjusted sedation regimen.  ICU associated discomfort P:   Goal RASS 0 to -1, currently -2 Methadone - started 4/29; decrease to 15 mg Q8 5/5 (goal to get to BID dosing) Clonazepam  Cont quetiapine > change to 10047mam, 200 qpm 5/4 and follow MS Cont clonidine patch as adjunctive therapy VPA 5/01 as adjunctive therapy > decrease to 250m69md on 5/3 Cont PRN lorazepam and hydromorphone   GLOBAL: -Confirmed DNR status in event of cardiac arrest, They also understand that we would not undertake HD unless rest of clinical status much improved -will adjust sedating meds down SLOWLY as a major issue for him has been agitation affecting work to breathe  CC time 35 minutes  BranNoe Gens-C [facility] Pgr: [phone] or [phone]/2015, 8:54 AM  RobeBaltazar Apo, PhD 11/01/2013, 11:44 AM [facility] [phone] no answer [phone]

## 2013-11-01 NOTE — Progress Notes (Signed)
I met pt's wife Corporate treasurer) in hall and while speaking I told her I would ck on her. When I arrived she was sitting in chair, alone, by the sink and very upset - crying. I provided emotional support and tissue. She shared with me why he is here. She talked about their relationship and how she wants him to come out of this. She said she hopes when he looks at her that he knows he is forgiven.  She was very emotional and open about pt's condition and illness.  She is processing the unknown stating she wants "this" to be over but she just didn't know "how" it was going to be over.  She cried more and I continued to offer emotional support and allow her to talk through the sadness and the unknowns. She shared a Energy manager with me and I shared others to offer her hope. She also appreciated the prayer during our visit. She talked about her children and how they do not know the diagnosis of their father. She said she felt better at the end of our visit and felt more peace than earlier. [name] was having a very difficult day today. Please call if additional support is needed. I will refer pt to next Chaplain to follow-up. Ernest Haber Chaplain  11/01/13 1300  Clinical Encounter Type  Visited With Patient

## 2013-11-01 NOTE — Progress Notes (Signed)
No SBT done at this time due to pt RR in the 40's. Rt will try later when pt is more stable.

## 2013-11-01 NOTE — Progress Notes (Signed)
[facility]  Date of Admission:  10/03/2013  Antibiotics: Tivicay/Truvada Bactrim Prophylaxis Azithromycin prophylaxis Fluconazole 17 days Fluconazole weekly starting 5/10  Subjective: No change, trying to wean vent, sedation  Objective: Temp:  [97.5 F (36.4 C)-100.1 F (37.8 C)] 98.8 F (37.1 C) (05/05 0400) Pulse Rate:  [63-131] 63 (05/05 0800) Resp:  [14-56] 19 (05/05 0800) BP: (112-176)/(58-116) 144/70 mmHg (05/05 0800) SpO2:  [92 %-100 %] 96 % (05/05 0800) FiO2 (%):  [40 %] 40 % (05/05 0739) Weight:  [141 lb 12.1 oz (64.3 kg)] 141 lb 12.1 oz (64.3 kg) (05/05 0500)  General: sedated, unresponsive Skin: no rash Lungs: diffuse rhonchi Cor: RR Abdomen: soft, nt, nd, +bs Ext: no edema  Lab Results Lab Results  Component Value Date   WBC 14.9* 11/01/2013   HGB 8.5* 11/01/2013   HCT 27.4* 11/01/2013   MCV 93.8 11/01/2013   PLT 275 11/01/2013    Lab Results  Component Value Date   CREATININE 0.36* 11/01/2013   BUN 24* 11/01/2013   NA 138 11/01/2013   K 4.5 11/01/2013   CL 90* 11/01/2013   CO2 43* 11/01/2013    Lab Results  Component Value Date   ALT 69* 11/01/2013   AST 19 11/01/2013   ALKPHOS 80 11/01/2013   BILITOT 0.2* 11/01/2013      Microbiology: Recent Results (from the past 240 hour(s))  CULTURE, RESPIRATORY (NON-EXPECTORATED)     Status: None   Collection Time    10/23/13 11:40 AM      Result Value Ref Range Status   Specimen Description TRACHEAL ASPIRATE   Final   Special Requests Immunocompromised   Final   Gram Stain     Final   Value: RARE WBC PRESENT,BOTH PMN AND MONONUCLEAR     RARE SQUAMOUS EPITHELIAL CELLS PRESENT     MODERATE GRAM POSITIVE COCCI     IN PAIRS FEW GRAM NEGATIVE RODS     RARE GRAM POSITIVE RODS   Culture     Final   Value: Non-Pathogenic Oropharyngeal-type Flora Isolated.     Performed at [facility]   Report Status 10/25/2013 FINAL   Final  CULTURE, BLOOD (ROUTINE X 2)     Status: None   Collection  Time    10/23/13 11:55 AM      Result Value Ref Range Status   Specimen Description BLOOD RIGHT ARM   Final   Special Requests     Final   Value: BOTTLES DRAWN AEROBIC AND ANAEROBIC 4CC BOTH BOTTLES   Culture  Setup Time     Final   Value: 10/23/2013 18:28     Performed at [facility]   Culture     Final   Value: NO GROWTH 5 DAYS     Performed at [facility]   Report Status 10/29/2013 FINAL   Final  URINE CULTURE     Status: None   Collection Time    10/23/13 12:02 PM      Result Value Ref Range Status   Specimen Description URINE, CATHETERIZED   Final   Special Requests Immunocompromised   Final   Culture  Setup Time     Final   Value: 10/23/2013 20:38     Performed at [facility]   Colony Count     Final   Value: NO GROWTH     Performed at [facility]   Culture     Final   Value:  NO GROWTH     Performed at [facility]   Report Status 10/24/2013 FINAL   Final  CULTURE, RESPIRATORY (NON-EXPECTORATED)     Status: None   Collection Time    10/26/13  5:30 PM      Result Value Ref Range Status   Specimen Description TRACHEAL ASPIRATE   Final   Special Requests NONE   Final   Gram Stain     Final   Value: MODERATE WBC PRESENT,BOTH PMN AND MONONUCLEAR     RARE SQUAMOUS EPITHELIAL CELLS PRESENT     ABUNDANT GRAM POSITIVE COCCI IN PAIRS     ABUNDANT GRAM NEGATIVE RODS     Performed at [facility]   Culture     Final   Value: Non-Pathogenic Oropharyngeal-type Flora Isolated.     Performed at [facility]   Report Status 10/29/2013 FINAL   Final  CULTURE, BLOOD (ROUTINE X 2)     Status: None   Collection Time    10/26/13  6:25 PM      Result Value Ref Range Status   Specimen Description BLOOD RIGHT ARM   Final   Special Requests BOTTLES DRAWN AEROBIC AND ANAEROBIC 5CC   Final   Culture  Setup Time     Final   Value: 10/27/2013 01:59     Performed at [facility]   Culture     Final   Value:        BLOOD  CULTURE RECEIVED NO GROWTH TO DATE CULTURE WILL BE HELD FOR 5 DAYS BEFORE ISSUING A FINAL NEGATIVE REPORT     Performed at [facility]   Report Status PENDING   Incomplete  CULTURE, BLOOD (ROUTINE X 2)     Status: None   Collection Time    10/26/13  6:31 PM      Result Value Ref Range Status   Specimen Description BLOOD RIGHT HAND   Final   Special Requests BOTTLES DRAWN AEROBIC ONLY 3CC   Final   Culture  Setup Time     Final   Value: 10/27/2013 01:59     Performed at [facility]   Culture     Final   Value:        BLOOD CULTURE RECEIVED NO GROWTH TO DATE CULTURE WILL BE HELD FOR 5 DAYS BEFORE ISSUING A FINAL NEGATIVE REPORT     Performed at [facility]   Report Status PENDING   Incomplete  CLOSTRIDIUM DIFFICILE BY PCR     Status: None   Collection Time    10/28/13  1:23 PM      Result Value Ref Range Status   C difficile by pcr NEGATIVE  NEGATIVE Final   Comment: [facility]    Studies/Results: Dg Chest Port 1 View  11/01/2013   CLINICAL DATA:  Assess pneumothorax.  EXAM: PORTABLE CHEST - 1 VIEW  COMPARISON:  10/31/2013.  FINDINGS: Tubes and lines remaining good position. Bilateral airspace opacities persist. Unchanged tracheostomy.  Poorly seen on yesterday's film, but probably present, is a tiny right apical pneumothorax (arrow). No similar left-sided abnormality.  IMPRESSION: No change aeration. Bilateral airspace opacities persist. Stable tubes and lines.  Small right apical pneumothorax.   Electronically Signed   By: Davonna BellingJohn  Curnes M.D.   On: 11/01/2013 07:10   Dg Chest Port 1 View  10/31/2013   NAME CLINICAL DATA: Faxed.  EXAM: PORTABLE CHEST - 1 VIEW  COMPARISON:  Chest  x-ray from yesterday  FINDINGS: A feeding tube remains coiled in the stomach. There is a left upper extremity PICC, tip in the region of the mid SVC. Tracheostomy tube and right chest tube in similar position.  Unchanged heart size and mediastinal contours. Diffuse lung  opacification is without progression. No increasing pleural fluid. No visible pneumothorax. Subcutaneous emphysema at the thoracic inlet appears similar to previous.  IMPRESSION: 1. Unchanged positioning of tubes and lines. The feeding tube ends in the proximal stomach. 2. No visible pneumothorax. Stable subcutaneous emphysema in the right chest wall and thoracic inlet. 3. Unchanged diffuse airspace disease.   Electronically Signed   By: Tiburcio Pea M.D.   On: 10/31/2013 05:31    Assessment/Plan: 1) Respiratory failure - trying to wean.    2) HIV/AIDS - on appropriate meds, prophylaxis.  Fluconazole weekly.   3)  Fungemia - finished therapy.  No family at bedside  Gardiner Barefoot, MD [facility] [email or web address] [phone] pager   [phone] cell 11/01/2013, 8:44 AM

## 2013-11-02 ENCOUNTER — Inpatient Hospital Stay (HOSPITAL_COMMUNITY): Payer: BC Managed Care – PPO

## 2013-11-02 LAB — BASIC METABOLIC PANEL
BUN: 29 mg/dL — ABNORMAL HIGH (ref 6–23)
CO2: 43 meq/L — AB (ref 19–32)
Calcium: 8.6 mg/dL (ref 8.4–10.5)
Chloride: 90 mEq/L — ABNORMAL LOW (ref 96–112)
Creatinine, Ser: 0.35 mg/dL — ABNORMAL LOW (ref 0.50–1.35)
GFR calc non Af Amer: >90 mL/min (ref >90)
Glucose, Bld: 162 mg/dL — ABNORMAL HIGH (ref 70–99)
Potassium: 4.3 mEq/L (ref 3.7–5.3)
Sodium: 139 mEq/L (ref 137–147)

## 2013-11-02 LAB — GLUCOSE, CAPILLARY
GLUCOSE-CAPILLARY: 132 mg/dL — AB (ref 70–99)
GLUCOSE-CAPILLARY: 157 mg/dL — AB (ref 70–99)
Glucose-Capillary: 142 mg/dL — ABNORMAL HIGH (ref 70–99)
Glucose-Capillary: 128 mg/dL — ABNORMAL HIGH (ref 70–99)
Glucose-Capillary: 154 mg/dL — ABNORMAL HIGH (ref 70–99)
Glucose-Capillary: 173 mg/dL — ABNORMAL HIGH (ref 70–99)
Glucose-Capillary: 145 mg/dL — ABNORMAL HIGH (ref 70–99)

## 2013-11-02 LAB — CULTURE, BLOOD (ROUTINE X 2)
CULTURE: NO GROWTH
Culture: NO GROWTH

## 2013-11-02 LAB — CBC
HEMATOCRIT: 30.4 % — AB (ref 39.0–52.0)
Hemoglobin: 9.3 g/dL — ABNORMAL LOW (ref 13.0–17.0)
MCH: 29 pg (ref 26.0–34.0)
MCHC: 30.6 g/dL (ref 30.0–36.0)
MCV: 94.7 fL (ref 78.0–100.0)
Platelets: 347 10*3/uL (ref 150–400)
RBC: 3.21 MIL/uL — ABNORMAL LOW (ref 4.22–5.81)
RDW: 18.3 % — ABNORMAL HIGH (ref 11.5–15.5)
WBC: 34.1 10*3/uL — AB (ref 4.0–10.5)

## 2013-11-02 MED ORDER — SODIUM CHLORIDE 0.9 % IV SOLN
1.0000 mg/h | INTRAVENOUS | Status: DC
  Administered 2013-11-02: 1 mg/h via INTRAVENOUS
  Administered 2013-11-03 (×2): 4 mg/h via INTRAVENOUS
  Administered 2013-11-03: 2 mg/h via INTRAVENOUS
  Administered 2013-11-04 – 2013-11-05 (×2): 4 mg/h via INTRAVENOUS
  Administered 2013-11-06: 1 mg/h via INTRAVENOUS
  Filled 2013-11-02 (×7): qty 10

## 2013-11-02 MED ORDER — HYDROMORPHONE HCL PF 10 MG/ML IJ SOLN
0.5000 mg/h | INTRAMUSCULAR | Status: DC
  Administered 2013-11-02 (×2): 0.5 mg/h via INTRAVENOUS
  Administered 2013-11-03 – 2013-11-05 (×3): 2 mg/h via INTRAVENOUS
  Filled 2013-11-02 (×4): qty 5

## 2013-11-02 NOTE — Progress Notes (Signed)
PULMONARY / CRITICAL CARE MEDICINE   Name: William Bender MRN: [id number] DOB: 1965-08-08    ADMISSION DATE:  10/15/2013 CONSULTATION DATE:  10/07/2013  REFERRING MD :  EDP  CHIEF COMPLAINT:  Shortness of breath, fever, cough  BRIEF PATIENT DESCRIPTION:  48 yo with recently diagnosis of HIV/AIDS (CD4 = 30 on 3/31) admitted 4/4 with acute hypoxemic respiratory failure and bilateral pneumonia. Of note PCP negative preadmission on Bactrim Px.  SIGNIFICANT / STUDIES EVENTS: 4/04 - Chest CTA:  No embolism, bilateral airspace disease 4/09 - Chest CT: Pneumomediastinum, small R pneumothorax, bilateral airspace disease 4/15 - RUE Venous Doppler: no DVT 4/18 - Echocardiogram: EF 60%, no RWMA 4/24 - Spontaneous R PTX > chest tube placed 4/24 - Trach tube placed 4/26 - BLE venous Dopplers: No DVT noted 4/26 - BUE venous Dopplers: No DVT noted 4/29 - placed back on IV dilaudid drip.PC increased to 25 and fio2 to 50% per float MD. 4/29 - Hgb 6.2. One unit PRBCs transfused 4/29 - [place] conference with wife, mother and father. Plan to continue full support but not escalate much from current level. Specifically discussed was possibility of ACLS in event of cardiac arrest and HD in event of acute renal failure. It was agreed that these would be excessive interventions in his current status 4/29 - started on methadone. Steroids empirically increased for non-resolving pneumonitis/ARDS. CD4 count remains < 30 4/30 - better vent synchrony with methadone started 4/29. Tolerating PS 10 cm H2O 5/01 - did not tolerate PSV 24 wean, tachypnea / agitation. Valproic acid added as adjunct for delirium 5/05 - periods of intermittent sedation / agitation.no weaning today due to poor effort.  Thick secretions    LINES / TUBES: ETT 4/5 >> 4/24 IJ CVL 4/5 >> 4/17 LUE PICC 4/17 >> 5/01 (ordered) Trach tube [name]) 4/24 >>  R chest tube 4/24 >>  LUE PICC - exchanged over wire 5/01 >>   MICRO: HIV 1 RNA Quant  3/17:  299,371  3/31: 13,616    4/11: 756  4/27: 53 AFB  BAL 4/05 >> smear neg >> NGTD Fungal BAL 4/05 >> smear neg >> NGTD Blood 4/04 >> NEG CMV BAL 4/05 >> NEG Bact BAL 4/05 >> NEG Pneumocystis DFA 4/05 >> NEG Blood 4/11 >> NEG AFB blood cx 4/11 >> NGTD RVP 4/11 >> PARAINFLUENZA 2 Blast Abs 4/13 >> NEG Histo Abs 4/13 >> NEG Fungal BAL 4/13 >> CANDIDA ALBICANS Bact BAL 4/13 >> NEG M TB complex PCR, BAL 4/13 >> NEG CMV DNA probe (serum) 4/13 >> POSITIVE (101,862) Blood 4/16 >> 1/2 CANDIDA ALBICANS Resp 4/16 >> NEG Urine 4/16  >> NEG Resp 4/20 >> NEG Blood 4/20 >> NEG Resp 422 >> NEG Blood 4/22 >> NEG C diff 4/23 >> NEG Urine 4/26 >> NEG Resp 4/26 >> NOF Blood 4/26 >> neg Resp 4/29 >> abundant GPC, abundant GNR >> NOF Blood 4/29 >>  C diff 5/1 >> negative   ANTIBIOTICS: (all abx per ID) HAART Vanc 4/04 >> off Cefepime 4/04 >> off Micafungin 4/17 >> 4/24 Gancyclovir 4/6 >> 5/01 Fluconazole (fungemia) 4/24 >> 5/4 Fluconazole (px dosing) 5/4 >>  Azithromycin (MAC px) 4/19 >>  TMP-SMX (px dosage) 4/04 >>    SUBJECTIVE:   New fever to 38.1, thick resp secretions.  Does not tolerate change from PCV to PSV.   VITAL SIGNS: Temp:  [98.7 F (37.1 C)-100.6 F (38.1 C)] 98.7 F (37.1 C) (05/06 0800) Pulse Rate:  [58-135] 122 (05/06 1000)  Resp:  [21-51] 36 (05/06 1000) BP: (103-182)/(56-117) 174/97 mmHg (05/06 1000) SpO2:  [88 %-100 %] 95 % (05/06 1000) FiO2 (%):  [40 %] 40 % (05/06 0900) Weight:  [64.3 kg (141 lb 12.1 oz)] 64.3 kg (141 lb 12.1 oz) (05/06 0500)  VENTILATOR SETTINGS: Vent Mode:  [-] PCV FiO2 (%):  [40 %] 40 % Set Rate:  [25 bmp] 25 bmp PEEP:  [5 cmH20] 5 cmH20 Pressure Support:  [20 cmH20] 20 cmH20 Plateau Pressure:  [23 cmH20-29 cmH20] 29 cmH20  INTAKE / OUTPUT: Intake/Output     05/05 0701 - 05/06 0700 05/06 0701 - 05/07 0700   I.V. (mL/kg) 260 (4) 20 (0.3)   NG/GT 2660 90   IV Piggyback     Total Intake(mL/kg) 2920 (45.4) 110 (1.7)   Urine  (mL/kg/hr) 2815 (1.8) 175 (0.8)   Chest Tube 20 (0)    Total Output 2835 175   Net +85 -65        Stool Occurrence 3 x     PHYSICAL EXAMINATION: Gen: vented, RASS -2, cachectic HEENT: #6 trach midline, secretions on trach dressing PULM: on PCV, scattered rhonchi, no air leak chest tube / to water seal CV: regular, no murmur AB: BS+, soft Ext: warm no edema Neuro: moving spontaneously, not able to interact  LABS:  Recent Labs Lab 10/29/13 0638 11/01/13 0632  PHART 7.386 7.409  PCO2ART 60.2* 69.4*  PO2ART 88.9 71.2*  HCO3 35.0* 43.0*  TCO2 33.2 40.3  O2SAT 96.6 93.2    Recent Labs Lab 10/30/13 0540 11/01/13 0535 11/02/13 0544  HGB 8.2* 8.5* 9.3*  HCT 26.9* 27.4* 30.4*  WBC 14.7* 14.9* 34.1*  PLT 277 275 347    Recent Labs Lab 10/29/13 0620 10/30/13 0540 10/31/13 0457 11/01/13 0535 11/02/13 0544  NA 144 142 139 138 139  K 5.1 4.6 4.4 4.5 4.3  CL 102 98 92* 90* 90*  CO2 38* 41* 41* 43* 43*  GLUCOSE 152* 160* 151* 171* 162*  BUN 24* 22 22 24* 29*  CREATININE 0.37* 0.33* 0.32* 0.36* 0.35*  CALCIUM 8.4 8.1* 8.5 8.4 8.6  MG  --  2.2  --  2.0  --   PHOS  --  2.8  --  3.7  --     Recent Labs Lab 10/27/13 0610  INR 1.21    Recent Labs Lab 10/27/13 0610 11/01/13 0535  AST  --  19  ALT  --  69*  ALKPHOS  --  80  BILITOT  --  0.2*  PROT  --  5.1*  ALBUMIN  --  1.8*  INR 1.21  --     Dg Chest Port 1 View  11/02/2013   CLINICAL DATA:  Assess tracheostomy hand airspace disease  EXAM: PORTABLE CHEST - 1 VIEW  COMPARISON:  Chest x-ray from yesterday  FINDINGS: Stable, unremarkable positioning of tracheostomy tube, feeding tube, left upper extremity PICC, and right-sided chest tube.  Unchanged mild cardiomegaly. Unchanged upper mediastinal contours. Bilateral interstitial and airspace opacity is unchanged in this patient with PCP pneumonia. Right apical pneumothorax is not seen today. Resolved subcutaneous gas.  IMPRESSION: 1. Unchanged positioning of tubes  and lines. 2. Unchanged bilateral pneumonia. 3. No visible pneumothorax.   Electronically Signed   By: Jorje Guild M.D.   On: 11/02/2013 05:49   Dg Chest Port 1 View  11/01/2013   CLINICAL DATA:  Assess pneumothorax.  EXAM: PORTABLE CHEST - 1 VIEW  COMPARISON:  10/31/2013.  FINDINGS: Tubes and lines  remaining good position. Bilateral airspace opacities persist. Unchanged tracheostomy.  Poorly seen on yesterday's film, but probably present, is a tiny right apical pneumothorax (arrow). No similar left-sided abnormality.  IMPRESSION: No change aeration. Bilateral airspace opacities persist. Stable tubes and lines.  Small right apical pneumothorax.   Electronically Signed   By: Rolla Flatten M.D.   On: 11/01/2013 07:10   CXR: 5/5 - ARDS pattern,  Small R ptx  ASSESSMENT / PLAN:  PULMONARY A:  Prolonged VDRF ARDS - suspect steroid responsive component Parainfluenza virus PNA  Pneumomediastinum, resolved Spontaneous R pneumothorax Tracheostomy status, ? Some evidence trach suture skin breakdown Vent dyssynchrony Consider new PNA 5/6 P:   Vent settings reviewed - PCV 23, r 25, peep 5, 40%  Cont vent bundle Cont chest tube on water seal Consider FOB for cx data.  Diuresis as he can tolerate - 9.7 L positive for hosp as of 5/4; 71m IV bid and follow Freq CXR as long as chest tube in place; would be risky to remove when he is requiring high vent pressures Increased systemic steroids 4/29 > 848mq 12h, he has not historically tolerated decrease of steroids May be getting close to place where he could [facility]   CARDIOVASCULAR A:  Intermittent sinus tachycardia, reactive (fever, pain, etc) Abnormal EKG, resolved Hypertension, controlled P:  Cont to monitor BP and rhythm  RENAL A:   Hypernatremia, resolved Met alkalosis, mild P:   Monitor BMET intermittently Monitor I/Os Correct electrolytes as indicated  GASTROINTESTINAL A:   Protein calorie  malnutrition P:   SUP: Pantoprazole per tube Continue TFs  HEMATOLOGIC A:   Anemia of critical illness without evidence of acute blood loss FOB Positive  P:  DVT px: LMWH Monitor CBC intermittently Transfuse per usual ICU guidelines (Hgb < 7.0 or acute bleeding)  INFECTIOUS A:   HIV/AIDS Parainfluenza viral pneumonitis Candidemia CMV viremia Intermittent fevers P:   Micro and as above, currently off abx - ID service managing, appreciate assistance Follow trach suture site > no ulceration yet, but some evolving skin breakdown. At risk infxn  ENDOCRINE A:   Steroid induced hyperglycemia P:   Cont SSI Cont Lantus  NEUROLOGIC A:    Acute encephalopathy and agitation > on 5/2 very somnolent and unable to perform PSV, suspect due to adjusted sedation regimen.  ICU associated discomfort P:   Goal RASS 0 to -1, currently -2 Methadone - started 4/29; decreased to 15 mg Q8 5/5 (goal to get to BID dosing) Clonazepam  Cont quetiapine > 10055mam, 200 qpm 5/4 and follow MS Cont clonidine patch as adjunctive therapy VPA 5/01 as adjunctive therapy > decrease to 250m52md on 5/3 Cont PRN lorazepam and hydromorphone   GLOBAL: -DNR status in event of cardiac arrest, They also understand that we would not undertake HD unless rest of clinical status much improved -will adjust sedating meds down SLOWLY as a major issue for him has been agitation affecting work to breathe  CC time 35 minutes   RobeBaltazar Apo, PhD 11/02/2013, 10:15 AM [facility] [phone] no answer [phone]

## 2013-11-02 NOTE — Progress Notes (Signed)
[facility]  Date of Admission:  10/07/2013  Antibiotics: Tivicay/Truvada Bactrim Prophylaxis Azithromycin prophylaxis Fluconazole 17 days Fluconazole weekly starting 5/10  Subjective: No change, failed weaning this am  Objective: Temp:  [98.7 F (37.1 C)-100.6 F (38.1 C)] 98.7 F (37.1 C) (05/06 0800) Pulse Rate:  [58-135] 122 (05/06 1000) Resp:  [21-51] 36 (05/06 1000) BP: (103-182)/(56-117) 174/97 mmHg (05/06 1000) SpO2:  [88 %-100 %] 95 % (05/06 1000) FiO2 (%):  [40 %] 40 % (05/06 0900) Weight:  [141 lb 12.1 oz (64.3 kg)] 141 lb 12.1 oz (64.3 kg) (05/06 0500)  General: sedated, moving around, unresponsive Skin: no rash Lungs: diffuse rhonchi Cor: RR Abdomen: soft, nt, nd, +bs Ext: no edema  Lab Results Lab Results  Component Value Date   WBC 34.1* 11/02/2013   HGB 9.3* 11/02/2013   HCT 30.4* 11/02/2013   MCV 94.7 11/02/2013   PLT 347 11/02/2013    Lab Results  Component Value Date   CREATININE 0.35* 11/02/2013   BUN 29* 11/02/2013   NA 139 11/02/2013   K 4.3 11/02/2013   CL 90* 11/02/2013   CO2 43* 11/02/2013    Lab Results  Component Value Date   ALT 69* 11/01/2013   AST 19 11/01/2013   ALKPHOS 80 11/01/2013   BILITOT 0.2* 11/01/2013      Microbiology: Recent Results (from the past 240 hour(s))  CULTURE, RESPIRATORY (NON-EXPECTORATED)     Status: None   Collection Time    10/23/13 11:40 AM      Result Value Ref Range Status   Specimen Description TRACHEAL ASPIRATE   Final   Special Requests Immunocompromised   Final   Gram Stain     Final   Value: RARE WBC PRESENT,BOTH PMN AND MONONUCLEAR     RARE SQUAMOUS EPITHELIAL CELLS PRESENT     MODERATE GRAM POSITIVE COCCI     IN PAIRS FEW GRAM NEGATIVE RODS     RARE GRAM POSITIVE RODS   Culture     Final   Value: Non-Pathogenic Oropharyngeal-type Flora Isolated.     Performed at [facility]   Report Status 10/25/2013 FINAL   Final  CULTURE, BLOOD (ROUTINE X 2)     Status: None   Collection Time    10/23/13 11:55 AM      Result Value Ref Range Status   Specimen Description BLOOD RIGHT ARM   Final   Special Requests     Final   Value: BOTTLES DRAWN AEROBIC AND ANAEROBIC 4CC BOTH BOTTLES   Culture  Setup Time     Final   Value: 10/23/2013 18:28     Performed at [facility]   Culture     Final   Value: NO GROWTH 5 DAYS     Performed at [facility]   Report Status 10/29/2013 FINAL   Final  URINE CULTURE     Status: None   Collection Time    10/23/13 12:02 PM      Result Value Ref Range Status   Specimen Description URINE, CATHETERIZED   Final   Special Requests Immunocompromised   Final   Culture  Setup Time     Final   Value: 10/23/2013 20:38     Performed at [facility]   Colony Count     Final   Value: NO GROWTH     Performed at [facility]   Culture     Final   Value:  NO GROWTH     Performed at [facility]   Report Status 10/24/2013 FINAL   Final  CULTURE, RESPIRATORY (NON-EXPECTORATED)     Status: None   Collection Time    10/26/13  5:30 PM      Result Value Ref Range Status   Specimen Description TRACHEAL ASPIRATE   Final   Special Requests NONE   Final   Gram Stain     Final   Value: MODERATE WBC PRESENT,BOTH PMN AND MONONUCLEAR     RARE SQUAMOUS EPITHELIAL CELLS PRESENT     ABUNDANT GRAM POSITIVE COCCI IN PAIRS     ABUNDANT GRAM NEGATIVE RODS     Performed at [facility]   Culture     Final   Value: Non-Pathogenic Oropharyngeal-type Flora Isolated.     Performed at [facility]   Report Status 10/29/2013 FINAL   Final  CULTURE, BLOOD (ROUTINE X 2)     Status: None   Collection Time    10/26/13  6:25 PM      Result Value Ref Range Status   Specimen Description BLOOD RIGHT ARM   Final   Special Requests BOTTLES DRAWN AEROBIC AND ANAEROBIC 5CC   Final   Culture  Setup Time     Final   Value: 10/27/2013 01:59     Performed at [facility]   Culture     Final   Value:  NO GROWTH 5 DAYS     Performed at [facility]   Report Status 11/02/2013 FINAL   Final  CULTURE, BLOOD (ROUTINE X 2)     Status: None   Collection Time    10/26/13  6:31 PM      Result Value Ref Range Status   Specimen Description BLOOD RIGHT HAND   Final   Special Requests BOTTLES DRAWN AEROBIC ONLY 3CC   Final   Culture  Setup Time     Final   Value: 10/27/2013 01:59     Performed at [facility]   Culture     Final   Value: NO GROWTH 5 DAYS     Performed at [facility]   Report Status 11/02/2013 FINAL   Final  CLOSTRIDIUM DIFFICILE BY PCR     Status: None   Collection Time    10/28/13  1:23 PM      Result Value Ref Range Status   C difficile by pcr NEGATIVE  NEGATIVE Final   Comment: Performed at [facility]    Studies/Results: Dg Chest Port 1 View  11/02/2013   CLINICAL DATA:  Assess tracheostomy hand airspace disease  EXAM: PORTABLE CHEST - 1 VIEW  COMPARISON:  Chest x-ray from yesterday  FINDINGS: Stable, unremarkable positioning of tracheostomy tube, feeding tube, left upper extremity PICC, and right-sided chest tube.  Unchanged mild cardiomegaly. Unchanged upper mediastinal contours. Bilateral interstitial and airspace opacity is unchanged in this patient with PCP pneumonia. Right apical pneumothorax is not seen today. Resolved subcutaneous gas.  IMPRESSION: 1. Unchanged positioning of tubes and lines. 2. Unchanged bilateral pneumonia. 3. No visible pneumothorax.   Electronically Signed   By: Tiburcio Pea M.D.   On: 11/02/2013 05:49   Dg Chest Port 1 View  11/01/2013   CLINICAL DATA:  Assess pneumothorax.  EXAM: PORTABLE CHEST - 1 VIEW  COMPARISON:  10/31/2013.  FINDINGS: Tubes and lines remaining good position. Bilateral airspace opacities persist. Unchanged tracheostomy.  Poorly seen on yesterday's film, but probably present,  is a tiny right apical pneumothorax (arrow). No similar left-sided abnormality.  IMPRESSION: No change aeration.  Bilateral airspace opacities persist. Stable tubes and lines.  Small right apical pneumothorax.   Electronically Signed   By: Davonna BellingJohn  Curnes M.D.   On: 11/01/2013 07:10    Assessment/Plan: 1) Respiratory failure - trying to wean without success.  2) HIV/AIDS - on appropriate meds, prophylaxis.  Fluconazole weekly.   3)  Fungemia - finished therapy.  4) Fever - now WBC elevated.  Check UA, respiratory culture, blood cultures with fever.  If new fever, consider cefepime or imipenem.    5)  Disposition - I had a discussion at length with the patients father at bedside.  I discussed how his prognosis is very poor and likelihood of return to normal or near normal function is very unlikely.  I discussed that he would be placed in an LTACH on vent since he has again failed weaning.  I see little chance of meaningful recovery and would suggest consideration of palliative care.  We discussed that the patients wife continues to remain hopeful despite the prognosis largely due to religious beliefs.      I suggested a palliative care consult to first discuss with the patients father, mother and wife what palliative care is, and if the wife is ameniable to have the discussion with the physicians including CCM and myself with the palliative care team.    Spent 35 minutes with patient including 25 minutes in discussion of #5    Gardiner Barefootobert W Comer, MD [facility] [email or web address] [phone] pager   [phone] cell 11/02/2013, 10:45 AM

## 2013-11-02 NOTE — Progress Notes (Signed)
11/02/13 1600  Clinical Encounter Type  Visited With Family (wife Harmon PierCaryn, mother Cordelia PenSherry)  Visit Type Follow-up;Spiritual support;Social support  Referral From Family  Spiritual Encounters  Spiritual Needs Emotional;Grief support   Caryn and Cordelia PenSherry requested chaplain support to process their awareness and feedback from MDs that Travers's condition is unlikely to improve.  Tearfully Caryn concluded, "I just want him to pass peacefully."  They are beginning to consider what the process of withdrawal of care might look like and are asking questions about Palliative support.  They hope deeply that Kipp BroodBrent will survive through this very emotionally loaded weekend.  (Daughter Lillia AbedLindsay takes her last AP exam on Friday.  Harmon PierCaryn has a milestone birthday on Saturday, for which they previously had big celebration plans.  Mother's Day is Sunday.)  Family is working through anticipatory grief, the weight of beginning to acknowledge the severity of his condition and its implications for quality of life and possibly end of life, regrets and hopes, and guilt about discernment and decisions they may face. Provided reflective listening, spiritual companionship, conversation about their strong feelings and grief and needs for support.  [place] will continue to follow closely, but please also page as needs arise.  Thank you.  [address]Chaplain Cassara Nida [place], [place] [phone]

## 2013-11-02 NOTE — Progress Notes (Signed)
Rt helped MD with bedside bronch. Pt has no adverse reaction to procedure.

## 2013-11-02 NOTE — Progress Notes (Signed)
eLink Physician-Brief Progress Note Patient Name: William Bender DOB: 03/02/1966 MRN: [id number]  Date of Service  11/02/2013   HPI/Events of Note   Agitated, asynchronous, no improvement with PRN regimen   eICU Interventions   Started Dilaudid / Versed gtt    Intervention Category Intermediate Interventions: Other:  Feather Berrie 11/02/2013, 7:20 PM

## 2013-11-02 NOTE — Progress Notes (Signed)
Pt failed SBT this am. Pt HR in the 140's, low MV with PS of 20. Pt has lots of thick tan secretions, when suction HR in the 160's.

## 2013-11-02 NOTE — Consult Note (Signed)
WOC attempted to re-eval wounds today, however upon my arrival patient is being prepared for bronchoscopy.  Will attempt to see later in the week.   Karine Garn [place] RN,CWOCN [phone]

## 2013-11-02 NOTE — Procedures (Signed)
Bronchoscopy Procedure Note William Bender [id number] 08/19/1965  Procedure: Bronchoscopy Indications: Obtain specimens for culture and/or other diagnostic studies  Procedure Details Consent: Risks of procedure as well as the alternatives and risks of each were explained to the (patient/caregiver).  Consent for procedure obtained. Time Out: Verified patient identification, verified procedure, site/side was marked, verified correct patient position, special equipment/implants available, medications/allergies/relevent history reviewed, required imaging and test results available.  Performed  In preparation for procedure, patient was given 100% FiO2 and bronchoscope lubricated. Sedation: dilaudid  FOB introduced via trach. No significant secretions noted. RUL BAL performed with clear fluid returned. Will send for cx Airway entered and the following bronchi were examined: RUL, RML, RLL, LUL and LLL.   Procedures performed: RUL BAL, 60cc instilled and 30cc returned Bronchoscope removed.    Evaluation Hemodynamic Status: BP stable throughout; O2 sats: stable throughout Patient's Current Condition: stable Specimens:  RUL BAL for cx data Complications: No apparent complications Patient did tolerate procedure well.  Levy Pupaobert Jencarlos Nicolson, MD, PhD 11/02/2013, 12:47 PM [facility] [phone] or if no answer [phone]

## 2013-11-02 NOTE — Progress Notes (Signed)
NUTRITION FOLLOW UP  Intervention:   - Continue TF via NGT of Oxepa to 26m/hr with Prostat 366mQID - Will continue to monitor   Nutrition Dx:   Inadequate oral intake related to inability to eat as evidenced by NPO - ongoing   Goal:   TF regimen to meet >/= 90% of their estimated nutrition needs - met  Monitor:   Weights, labs, TF tolerance, vent status   Assessment:   [age]/o male recently diagnosed with HIV and treated empirically for [facility]CP [facility]emic respiratory failure.   4/5 - Pt intubated 4/5. Per family, pt has lost ~15-20 lbs over the past few weeks. He is using nutritional supplements at home (Boost or Ensure.). Received consult for TF management, RD ordered Vital AF 1.2 @ 20 ml/hr via OG tube and increase by 10 ml every 4 hours to goal rate of 80 ml/hr. At goal rate, tube feeding regimen will provide 2304 kcal, 103 grams of protein, and 1549 ml of H2O. Goal rate meets 100% of estimated calorie and protein needs.   4/7 - Pt started on ARDS protocol 4/6. Per RN, pt tolerating Vital AF 1.2 at goal rate of 8087mr with 54m79m residuals this morning and 30-40ml2miduals overnight. Remains intubated.   4/10 - Patient remains intubated.  Receiving Oxepa at 20 ml/hr plus prostat and tolerating well. Propofol: 27.5 ml/hr provides 726 calories continues  4/13 - Remains intubated. Receiving Oxepa at 54ml/52mith Prosat 54ml Q43m provides 1480 calories, 105g protein. TF plus current calories from Propofol provide 2327 calories meeting 124% estimated calorie needs and 100% estimated protein needs. Paralytics added 4/10. Needed ET tube changed today which was performed after emergency bronchoscopy. Per RN, pt tolerating TF well without any residuals.   4/17 - Remains intubated. Receiving Oxepa to 20 ml/hr as pt getting high rate of Propfol. Oxepa at 20ml/hr1mh Prostat 54ml QID36mh calories from current Propfol rate will provide 1967 calories, 90g  protein, and 377ml free74mer and meet 105% estimated calorie needs and 86% estimated protein needs. Per RN, pt tolerating TF with no residuals.   4/20 - Remains intubated. Receiving Oxepa to 20 ml/hr as pt getting high rate of Propfol. Oxepa at 20ml/hr wi70mrostat 54ml QID wi50malories from current Propfol rate will provide 1967 calories, 90g protein, and 377ml free wa48mand meet 105% of previously estimated calorie needs and 86% estimated protein needs. Per conversation with RN, only 10ml of TF re39mals this morning. D/C Prostat and Oxepa. Start TF via OGT of Vital High Protein at 20ml/hr increa31my 10ml every 4 ho1mto goal of 60ml/hr. This wi64mrovide 1440 calories, 126g protein, 1203 ml water. Goal rate plus calories from Propofol will provide 2348 calories and meet 101% estimated calorie needs and 101% estimated protein needs. If IVF d/c, recommend 200ml water flushe68mtimes/day. Changed TF rate as pt getting so little of Oxepa for TF to be beneficial.   4/21 - Remains intubated. Receiving Vital High Protein at 50ml/hr which prov3m 1200 calories, 105g protein, and 1003ml free water whi48meets 52% of previously estimated calorie needs and 100% estimated protein needs. Continues to have severe ARDS per RN. 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 RN reports pt tolerating TF well without any residuals. Will change TF back to Oxepa for pt to get specialized nutrition now that pt no longer on Propofol. Ordered Oxepa at 50ml/hr50m  increase by 59m every 4 hours to goal of 642mhr with Prostat 3066maily  4/23 - Remains intubated. Per RN, pt tolerating TF well without any residuals, at goal rate. RN states they are awaiting family decision on terminal wean.   4/27 - Extubated 4/24 then had tracheostomy placed. Chest tube placed 4/24 for pneumothorax. On full vent support but weaning trial in progress. Per RN, pt with 70-100m50m TF residual today. Per conversation with RN,  plan is for goals of care meeting on Wednesday. Sodium elevated, free water flushes doubled yesterday.  4/30 - Had marked increase work of breathing early this morning on ventilator. Continues to get TF via NGT since pt had tracheostomy. Per conversation with RN, pt with <30ml76mresiduals today.   5/6 - MD confirmed DNR status 5/1, no plans to undertake HD unless clinical status much improved. Failed weaning this AM. Bronchoscopy performed this morning. Remains ventilated on trach with NGT for TF. Per RN, pt without any TF residuals overnight or this morning.   TF: Oxepa to 45ml/4mith Prostat 30ml t10mD -provides 2020 calories, 128g protein, and 848ml fr66mater and meets 99% estimated calorie needs and 100% estimated protein needs. Getting 200ml wat80mlushes 6 times/day via NGT which provides a total of 2048ml wate39m Patient is currently intubated on ventilator support MV: 11.8 L/min Temp (24hrs), Avg:99.3 F (37.4 C), Min:98.7 F (37.1 C), Max:100.6 F (38.1 C)  Propofol: off  ALT slightly elevated yesterday CBGs elevated     Height: Ht Readings from Last 1 Encounters:  10/21/13 5' 10"  (1.778 m)    Weight Status:   Wt Readings from Last 1 Encounters:  11/02/13 141 lb 12.1 oz (64.3 kg)  Admit wt:        155 lb (70.3 kg) Net I/Os: +10L  Re-estimated needs:  Kcal: 2040 Protein: 115-130g Fluid: per MD  Skin: Non-pitting RUE, LUE, LLE, RLE edema, stage 2 sacral pressure ulcer on buttocks    Diet Order:  NPO   Intake/Output Summary (Last 24 hours) at 11/02/13 1310 Last data filed at 11/02/13 1300  Gross per 24 hour  Intake   2770 ml  Output   2760 ml  Net     10 ml    Last BM: 5/5   Labs:   Recent Labs Lab 10/30/13 0540 10/31/13 0457 11/01/13 0535 11/02/13 0544  NA 142 139 138 139  K 4.6 4.4 4.5 4.3  CL 98 92* 90* 90*  CO2 41* 41* 43* 43*  BUN 22 22 24* 29*  CREATININE 0.33* 0.32* 0.36* 0.35*  CALCIUM 8.1* 8.5 8.4 8.6  MG 2.2  --  2.0  --    PHOS 2.8  --  3.7  --   GLUCOSE 160* 151* 171* 162*    CBG (last 3)   Recent Labs  11/02/13 0345 11/02/13 0836 11/02/13 1149  GLUCAP 128* 154* 157*    Scheduled Meds: . antiseptic oral rinse  15 mL Mouth Rinse QID  . azithromycin  1,200 mg Per Tube Weekly  . chlorhexidine  15 mL Mouth Rinse BID  . clonazepam  1.5 mg Per Tube BID  . cloNIDine  0.1 mg Transdermal Weekly  . collagenase   Topical Daily  . dolutegravir  50 mg Oral Daily  . emtricitabine-tenofovir  1 tablet Per Tube Daily  . enoxaparin (LOVENOX) injection  40 mg Subcutaneous Daily  . feeding supplement (OXEPA)  1,000 mL Per Tube Q24H  . feeding supplement (PRO-STAT  SUGAR FREE 64)  30 mL Per Tube QID  . [START ON Nov 26, 2013] fluconazole  200 mg Per Tube Weekly  . free water  200 mL Per Tube Q4H  . furosemide  40 mg Intravenous BID  . insulin aspart  0-15 Units Subcutaneous 6 times per day  . insulin glargine  25 Units Subcutaneous Daily  . methadone  15 mg Per Tube 3 times per day  . methylPREDNISolone (SOLU-MEDROL) injection  80 mg Intravenous Q12H  . metoprolol tartrate  12.5 mg Per Tube BID  . multivitamin  5 mL Per Tube Daily  . pantoprazole sodium  40 mg Per Tube Daily  . QUEtiapine  100 mg Per Tube q morning - 10a  . QUEtiapine  200 mg Per Tube QHS  . sodium chloride  10-40 mL Intracatheter Q12H  . sulfamethoxazole-trimethoprim  1 tablet Per Tube Daily  . Valproic Acid  250 mg Per Tube BID         Mikey College MS, RD, [place] Pager [phone] After Hours Pager

## 2013-11-03 ENCOUNTER — Inpatient Hospital Stay (HOSPITAL_COMMUNITY): Payer: BC Managed Care – PPO

## 2013-11-03 LAB — CBC
HCT: 29.7 % — ABNORMAL LOW (ref 39.0–52.0)
Hemoglobin: 9 g/dL — ABNORMAL LOW (ref 13.0–17.0)
MCH: 28.9 pg (ref 26.0–34.0)
MCHC: 30.3 g/dL (ref 30.0–36.0)
MCV: 95.5 fL (ref 78.0–100.0)
PLATELETS: 257 10*3/uL (ref 150–400)
RBC: 3.11 MIL/uL — ABNORMAL LOW (ref 4.22–5.81)
RDW: 18.8 % — ABNORMAL HIGH (ref 11.5–15.5)
WBC: 24.9 10*3/uL — AB (ref 4.0–10.5)

## 2013-11-03 LAB — MAGNESIUM: MAGNESIUM: 2.3 mg/dL (ref 1.5–2.5)

## 2013-11-03 LAB — BASIC METABOLIC PANEL
BUN: 33 mg/dL — ABNORMAL HIGH (ref 6–23)
CO2: 44 mEq/L (ref 19–32)
Calcium: 8.8 mg/dL (ref 8.4–10.5)
Chloride: 90 mEq/L — ABNORMAL LOW (ref 96–112)
Creatinine, Ser: 0.35 mg/dL — ABNORMAL LOW (ref 0.50–1.35)
GFR calc non Af Amer: >90 mL/min (ref >90)
Glucose, Bld: 168 mg/dL — ABNORMAL HIGH (ref 70–99)
POTASSIUM: 4.2 meq/L (ref 3.7–5.3)
SODIUM: 141 meq/L (ref 137–147)

## 2013-11-03 LAB — GLUCOSE, CAPILLARY
GLUCOSE-CAPILLARY: 151 mg/dL — AB (ref 70–99)
GLUCOSE-CAPILLARY: 163 mg/dL — AB (ref 70–99)
Glucose-Capillary: 126 mg/dL — ABNORMAL HIGH (ref 70–99)
Glucose-Capillary: 131 mg/dL — ABNORMAL HIGH (ref 70–99)
Glucose-Capillary: 136 mg/dL — ABNORMAL HIGH (ref 70–99)
Glucose-Capillary: 166 mg/dL — ABNORMAL HIGH (ref 70–99)
Glucose-Capillary: 170 mg/dL — ABNORMAL HIGH (ref 70–99)

## 2013-11-03 LAB — PNEUMOCYSTIS JIROVECI SMEAR BY DFA: PNEUMOCYSTIS JIROVECI AG: NEGATIVE

## 2013-11-03 LAB — PHOSPHORUS: Phosphorus: 3.7 mg/dL (ref 2.3–4.6)

## 2013-11-03 MED ORDER — COLLAGENASE 250 UNIT/GM EX OINT
TOPICAL_OINTMENT | Freq: Two times a day (BID) | CUTANEOUS | Status: DC
  Administered 2013-11-03: 1 via TOPICAL
  Administered 2013-11-03 – 2013-11-05 (×5): via TOPICAL

## 2013-11-03 NOTE — Consult Note (Addendum)
Palliative Medicine Team Consult Note  Met with patient's wife and parents. Chaplain Ree Edman also present for support.    48 yo with newly diagnosed HIV, admitted with respiratory failure/ARDS now vent dependent and not weaning-prognosis is very poor. Will plan on removing mechanical ventilation on Monday morning and transition to full comfort.  Maintain current level of intervention and maintain high level of comfort-minimize additional interventions. Maintain dilaudid and versed infusions.  Will try to arrange for Child Psychologist to help Santiago Glad deliver the news to her 3 and 48 yo tomorrow afternoon. Will also connect them with [facility].  Full consult to follow.  Lane Hacker, DO Palliative Medicine

## 2013-11-03 NOTE — Progress Notes (Signed)
TRACH TEAM NOTE  Per chart review, pt not responsive, on ventilator. No ST needs at this time. Will follow.  Donavan Burnetamara Consuello Lassalle, MS [facility] [phone]

## 2013-11-03 NOTE — Progress Notes (Signed)
[id number]/William Earlene Plateravis, RN, BSN, CCM, [phone] Chart reviewed for update of needs and condition./ PT REMAINS ON THE VENT, FAMILY IS AWAITING PALLIATIVE CARE MEETING TO DECIDE ABOUT NEXT STEPS.  WIFE IS COMING REALIZE THE POOR PROGNOSIS FOR THE PATIENT.

## 2013-11-03 NOTE — Progress Notes (Signed)
PULMONARY / CRITICAL CARE MEDICINE   Name: William Bender MRN: [id number] DOB: 05/12/1966    ADMISSION DATE:  10/04/2013 CONSULTATION DATE:  10/15/2013  REFERRING MD :  EDP  CHIEF COMPLAINT:  Shortness of breath, fever, cough  BRIEF PATIENT DESCRIPTION:  48 yo with recently diagnosis of HIV/AIDS (CD4 = 30 on 3/31) admitted 4/4 with acute hypoxemic respiratory failure and bilateral pneumonia. Of note PCP negative preadmission on Bactrim Px.  SIGNIFICANT / STUDIES EVENTS: 4/04 - Chest CTA:  No embolism, bilateral airspace disease 4/09 - Chest CT: Pneumomediastinum, small R pneumothorax, bilateral airspace disease 4/15 - RUE Venous Doppler: no DVT 4/18 - Echocardiogram: EF 60%, no RWMA 4/24 - Spontaneous R PTX > chest tube placed 4/24 - Trach tube placed 4/26 - BLE venous Dopplers: No DVT noted 4/26 - BUE venous Dopplers: No DVT noted 4/29 - placed back on IV dilaudid drip.PC increased to 25 and fio2 to 50% per float MD. 4/29 - Hgb 6.2. One unit PRBCs transfused 4/29 - [place] conference with wife, mother and father. Plan to continue full support but not escalate much from current level. Specifically discussed was possibility of ACLS in event of cardiac arrest and HD in event of acute renal failure. It was agreed that these would be excessive interventions in his current status 4/29 - started on methadone. Steroids empirically increased for non-resolving pneumonitis/ARDS. CD4 count remains < 30 4/30 - better vent synchrony with methadone started 4/29. Tolerating PS 10 cm H2O 5/01 - did not tolerate PSV 24 wean, tachypnea / agitation. Valproic acid added as adjunct for delirium 5/05 - periods of intermittent sedation / agitation.no weaning today due to poor effort.  Thick secretions    LINES / TUBES: ETT 4/5 >> 4/24 IJ CVL 4/5 >> 4/17 LUE PICC 4/17 >> 5/01 (ordered) Trach tube [name]) 4/24 >>  R chest tube 4/24 >>  LUE PICC - exchanged over wire 5/01 >>   MICRO: HIV 1 RNA Quant  3/17:  786,767  3/31: 13,616    4/11: 756  4/27: 53 AFB  BAL 4/05 >> smear neg >> NGTD Fungal BAL 4/05 >> smear neg >> NGTD Blood 4/04 >> NEG CMV BAL 4/05 >> NEG Bact BAL 4/05 >> NEG Pneumocystis DFA 4/05 >> NEG Blood 4/11 >> NEG AFB blood cx 4/11 >> NGTD RVP 4/11 >> PARAINFLUENZA 2 Blast Abs 4/13 >> NEG Histo Abs 4/13 >> NEG Fungal BAL 4/13 >> CANDIDA ALBICANS Bact BAL 4/13 >> NEG M TB complex PCR, BAL 4/13 >> NEG CMV DNA probe (serum) 4/13 >> POSITIVE (101,862) Blood 4/16 >> 1/2 CANDIDA ALBICANS Resp 4/16 >> NEG Urine 4/16  >> NEG Resp 4/20 >> NEG Blood 4/20 >> NEG Resp 422 >> NEG Blood 4/22 >> NEG C diff 4/23 >> NEG Urine 4/26 >> NEG Resp 4/26 >> NOF Blood 4/26 >> neg Resp 4/29 >> abundant GPC, abundant GNR >> NOF Blood 4/29 >>  C diff 5/1 >> negative RUL BAL PCP 5/6 >> negative RUL BAL 5/6 >>  BAL fungal 5/6 >>  BAL AFB 5/6 >>    ANTIBIOTICS: (all abx per ID) HAART Vanc 4/04 >> off Cefepime 4/04 >> off Micafungin 4/17 >> 4/24 Gancyclovir 4/6 >> 5/01 Fluconazole (fungemia) 4/24 >> 5/4 Fluconazole (px dosing) 5/4 >>  Azithromycin (MAC px) 4/19 >>  TMP-SMX (px dosage) 4/04 >>    SUBJECTIVE:   S/p FOB for cx data 5/6 More awake and tachypneic > sedating gtt's started overnight  VITAL SIGNS: Temp:  [97.7  F (36.5 C)-100.5 F (38.1 C)] 98.9 F (37.2 C) (05/07 0800) Pulse Rate:  [44-140] 121 (05/07 0900) Resp:  [18-48] 42 (05/07 0900) BP: (95-162)/(52-104) 155/97 mmHg (05/07 0900) SpO2:  [93 %-100 %] 97 % (05/07 0900) FiO2 (%):  [40 %] 40 % (05/07 0840) Weight:  [61.2 kg (134 lb 14.7 oz)] 61.2 kg (134 lb 14.7 oz) (05/07 0200)  VENTILATOR SETTINGS: Vent Mode:  [-] PCV FiO2 (%):  [40 %] 40 % Set Rate:  [25 bmp] 25 bmp PEEP:  [5 cmH20] 5 cmH20 Plateau Pressure:  [22 cmH20-31 cmH20] 27 cmH20  INTAKE / OUTPUT: Intake/Output     05/06 0701 - 05/07 0700 05/07 0701 - 05/08 0700   I.V. (mL/kg) 163 (2.7) 12 (0.2)   Other 200    NG/GT 760    Total  Intake(mL/kg) 1123 (18.3) 12 (0.2)   Urine (mL/kg/hr) 2625 (1.8) 275 (1.1)   Stool 1 (0)    Chest Tube 60 (0)    Total Output 2686 275   Net -1563 -263        Stool Occurrence 1 x     PHYSICAL EXAMINATION: Gen: vented, RASS -2, cachectic HEENT: #6 trach midline, secretions on trach dressing PULM: on PCV, scattered rhonchi, no air leak chest tube / to water seal CV: regular, no murmur AB: BS+, soft Ext: warm no edema Neuro: moving spontaneously, not able to interact  LABS:  Recent Labs Lab 10/29/13 0638 11/01/13 0632  PHART 7.386 7.409  PCO2ART 60.2* 69.4*  PO2ART 88.9 71.2*  HCO3 35.0* 43.0*  TCO2 33.2 40.3  O2SAT 96.6 93.2    Recent Labs Lab 11/01/13 0535 11/02/13 0544 11/03/13 0500  HGB 8.5* 9.3* 9.0*  HCT 27.4* 30.4* 29.7*  WBC 14.9* 34.1* 24.9*  PLT 275 347 257    Recent Labs Lab 10/30/13 0540 10/31/13 0457 11/01/13 0535 11/02/13 0544 11/03/13 0500  NA 142 139 138 139 141  K 4.6 4.4 4.5 4.3 4.2  CL 98 92* 90* 90* 90*  CO2 41* 41* 43* 43* 44*  GLUCOSE 160* 151* 171* 162* 168*  BUN 22 22 24* 29* 33*  CREATININE 0.33* 0.32* 0.36* 0.35* 0.35*  CALCIUM 8.1* 8.5 8.4 8.6 8.8  MG 2.2  --  2.0  --  2.3  PHOS 2.8  --  3.7  --  3.7   No results found for this basename: INR,  in the last 168 hours  Recent Labs Lab 11/01/13 0535  AST 19  ALT 69*  ALKPHOS 80  BILITOT 0.2*  PROT 5.1*  ALBUMIN 1.8*    Dg Chest Port 1 View  11/03/2013   CLINICAL DATA:  48 year old male with respiratory failure. AIDS. Initial encounter.  EXAM: PORTABLE CHEST - 1 VIEW  COMPARISON:  11/02/2013 and earlier.  FINDINGS: Portable AP semi upright view at 0520 hrs. Stable tracheostomy tube. Enteric feeding tube courses to the abdomen, appears looped and tip at the gastric fundus. Stable left PICC line. Stable right chest tube.  A small to moderate pneumothorax now is visible on the right, with pleural edge seen along the entire right lung periphery. New right supraclavicular  subcutaneous gas.  Confluent right perihilar and left upper lobe pulmonary opacity, not significantly changed. No definite pleural effusion. Stable cardiac size and mediastinal contours.  IMPRESSION: 1.  Stable lines and tubes.  Right chest tube remains in place. 2. Small to moderate right pneumothorax now visible. New right supraclavicular subcutaneous gas. 3. Stable lung parenchyma with confluent opacities favored to  be pneumonia.   Electronically Signed   By: Lars Pinks M.D.   On: 11/03/2013 07:35   Dg Chest Port 1 View  11/02/2013   CLINICAL DATA:  Assess tracheostomy hand airspace disease  EXAM: PORTABLE CHEST - 1 VIEW  COMPARISON:  Chest x-ray from yesterday  FINDINGS: Stable, unremarkable positioning of tracheostomy tube, feeding tube, left upper extremity PICC, and right-sided chest tube.  Unchanged mild cardiomegaly. Unchanged upper mediastinal contours. Bilateral interstitial and airspace opacity is unchanged in this patient with PCP pneumonia. Right apical pneumothorax is not seen today. Resolved subcutaneous gas.  IMPRESSION: 1. Unchanged positioning of tubes and lines. 2. Unchanged bilateral pneumonia. 3. No visible pneumothorax.   Electronically Signed   By: Jorje Guild M.D.   On: 11/02/2013 05:49   CXR: 5/5 - ARDS pattern,  Small R ptx  ASSESSMENT / PLAN:  PULMONARY A:  Prolonged VDRF ARDS - suspect steroid responsive component Parainfluenza virus PNA  Pneumomediastinum, resolved Spontaneous R pneumothorax Tracheostomy status, ? Some evidence trach suture skin breakdown Vent dyssynchrony Consider new PNA 5/6 P:   Vent settings reviewed - PCV 23, r 25, peep 5, 40%  Cont chest tube on water seal Diuresis as he can tolerate - 8.2 L positive for hosp as of 5/7; 73m IV bid and follow Freq CXR as long as chest tube in place; would be risky to remove when he is requiring high vent pressures Increased systemic steroids 4/29 > 863mq 12h, he has not historically tolerated decrease of  steroids May be getting close to place [facility] could [facility], but family now considering goals and possible palliation.    CARDIOVASCULAR A:  Intermittent sinus tachycardia, reactive (fever, pain, etc) Abnormal EKG, resolved Hypertension, controlled P:  Cont to monitor BP and rhythm  RENAL A:   Hypernatremia, resolved Met alkalosis, mild P:   Monitor BMET intermittently Monitor I/Os Correct electrolytes as indicated  GASTROINTESTINAL A:   Protein calorie malnutrition P:   SUP: Pantoprazole per tube Continue TFs  HEMATOLOGIC A:   Anemia of critical illness without evidence of acute blood loss FOB Positive  P:  DVT px: LMWH Monitor CBC intermittently Transfuse per usual ICU guidelines (Hgb < 7.0 or acute bleeding)  INFECTIOUS A:   HIV/AIDS Parainfluenza viral pneumonitis Candidemia CMV viremia Intermittent fevers P:   Micro and as above, currently off abx - ID service managing, appreciate assistance Follow trach suture site > no ulceration, but some skin breakdown. At risk infxn  ENDOCRINE A:   Steroid induced hyperglycemia P:   Cont SSI Cont Lantus  NEUROLOGIC A:    Acute encephalopathy and agitation > on 5/2 very somnolent and unable to perform PSV, suspect due to adjusted sedation regimen. Dilaudid and versed gtt's added back 5/6 pm ICU associated discomfort P:   Goal RASS 0 to -1, currently -2 Methadone - started 4/29; decreased to 15 mg Q8 5/5 (goal to get to BID dosing) Clonazepam  Cont quetiapine > 10040mam, 200 qpm 5/4 and follow MS Cont clonidine patch as adjunctive therapy VPA 5/01 as adjunctive therapy > decrease to 250m3md on 5/3 Cont PRN lorazepam and hydromorphone   GLOBAL: -DNR status in event of cardiac arrest, They also understand that we would not undertake HD unless rest of clinical status much improved -continue sedating gtt's at this time pending Palliative Care discussions with the family. It  appears that they are beginning to agree that he should not be supported this  way for much longer, that prognosis for improvement is very poor.   CC time 35 minutes   Baltazar Apo, MD, PhD 11/03/2013, 11:13 AM [facility] [phone] or if no answer [phone]

## 2013-11-03 NOTE — Consult Note (Addendum)
WOC wound follow up Wound type: Refer to previous WOC progress notes from 4/30. Sacral wound has increased in size; pt critically ill with multiple systemic factors which can impair healing.  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 Sacrum with unstageable wound 5X6cm, 100% tightly adhered eschar; pressure ulcer was present on admission but has continued to decline. Small amt tan drainage, strong odor.  Surrounded by 1 cm of pink moist wound bed to edges with small amt pink drainage.  Dressing procedure/placement/frequency: Continue present plan of care with Santyl to chemically debride non-viable tissue.  Cross-hatched wound to allow better penetration of enzymatic debridement;  pt tolerated without apparent discomfort and no bleeding. Pt on Sport low-airloss bed to decrease pressure. If eschar begins to soften, then hydrotherapy could assist with removal when pt is stable enough to remain turned on his side. Please re-consult if further assistance is needed.  Thank-you,  Cammie Mcgeeawn Venida Tsukamoto MSN, RN, CWOCN, [place]CN-AP, CNS [phone]

## 2013-11-03 NOTE — Progress Notes (Addendum)
[facility]  Date of Admission:  10-06-2013  Antibiotics: Tivicay/Truvada Bactrim Prophylaxis Azithromycin prophylaxis Fluconazole 17 days Fluconazole weekly starting 5/10  Subjective: No change, fever again yesterday  Objective: Temp:  [97.7 F (36.5 C)-100.5 F (38.1 C)] 98.9 F (37.2 C) (05/07 0800) Pulse Rate:  [44-140] 123 (05/07 0800) Resp:  [18-48] 23 (05/07 0800) BP: (95-174)/(52-104) 128/83 mmHg (05/07 0800) SpO2:  [93 %-100 %] 95 % (05/07 0800) FiO2 (%):  [40 %] 40 % (05/07 0328) Weight:  [134 lb 14.7 oz (61.2 kg)] 134 lb 14.7 oz (61.2 kg) (05/07 0200)  General: sedated, moving around, unresponsive Skin: no rash Lungs: diffuse rhonchi Cor: RR Abdomen: soft, nt, nd, +bs Ext: no edema  Lab Results Lab Results  Component Value Date   WBC 24.9* 11/03/2013   HGB 9.0* 11/03/2013   HCT 29.7* 11/03/2013   MCV 95.5 11/03/2013   PLT 257 11/03/2013    Lab Results  Component Value Date   CREATININE 0.35* 11/03/2013   BUN 33* 11/03/2013   NA 141 11/03/2013   K 4.2 11/03/2013   CL 90* 11/03/2013   CO2 44* 11/03/2013    Lab Results  Component Value Date   ALT 69* 11/01/2013   AST 19 11/01/2013   ALKPHOS 80 11/01/2013   BILITOT 0.2* 11/01/2013      Microbiology: Recent Results (from the past 240 hour(s))  CULTURE, RESPIRATORY (NON-EXPECTORATED)     Status: None   Collection Time    10/26/13  5:30 PM      Result Value Ref Range Status   Specimen Description TRACHEAL ASPIRATE   Final   Special Requests NONE   Final   Gram Stain     Final   Value: MODERATE WBC PRESENT,BOTH PMN AND MONONUCLEAR     RARE SQUAMOUS EPITHELIAL CELLS PRESENT     ABUNDANT GRAM POSITIVE COCCI IN PAIRS     ABUNDANT GRAM NEGATIVE RODS     Performed at [facility]   Culture     Final   Value: Non-Pathogenic Oropharyngeal-type Flora Isolated.     Performed at [facility]   Report Status 10/29/2013 FINAL   Final  CULTURE, BLOOD (ROUTINE X 2)     Status: None   Collection Time    10/26/13  6:25 PM      Result Value Ref Range Status   Specimen Description BLOOD RIGHT ARM   Final   Special Requests BOTTLES DRAWN AEROBIC AND ANAEROBIC 5CC   Final   Culture  Setup Time     Final   Value: 10/27/2013 01:59     Performed at [facility]   Culture     Final   Value: NO GROWTH 5 DAYS     Performed at [facility]   Report Status 11/02/2013 FINAL   Final  CULTURE, BLOOD (ROUTINE X 2)     Status: None   Collection Time    10/26/13  6:31 PM      Result Value Ref Range Status   Specimen Description BLOOD RIGHT HAND   Final   Special Requests BOTTLES DRAWN AEROBIC ONLY 3CC   Final   Culture  Setup Time     Final   Value: 10/27/2013 01:59     Performed at [facility]   Culture     Final   Value: NO GROWTH 5 DAYS     Performed at [facility]   Report Status 11/02/2013 FINAL  Final  CLOSTRIDIUM DIFFICILE BY PCR     Status: None   Collection Time    10/28/13  1:23 PM      Result Value Ref Range Status   C difficile by pcr NEGATIVE  NEGATIVE Final   Comment: [facility]  CULTURE, BAL-QUANTITATIVE     Status: None   Collection Time    11/02/13 12:55 PM      Result Value Ref Range Status   Specimen Description BRONCHIAL ALVEOLAR LAVAGE   Final   Special Requests NONE   Final   Gram Stain     Final   Value: NO WBC SEEN     NO SQUAMOUS EPITHELIAL CELLS SEEN     NO ORGANISMS SEEN     Performed at [facility]   Colony Count PENDING   Incomplete   Culture PENDING   Incomplete   Report Status PENDING   Incomplete  PNEUMOCYSTIS JIROVECI SMEAR BY DFA     Status: None   Collection Time    11/02/13 12:55 PM      Result Value Ref Range Status   Specimen Source-PJ[facility] BRONCHIAL WASHINGS   Final   Pneumocystis jiroveci Ag NEGATIVE   Final   Comment: [facility]formed [facility]    Studies/Results: Dg Chest Port 1 View  11/03/2013   CLINICAL DATA:  48 year old male with  respiratory failure. AIDS. Initial encounter.  EXAM: PORTABLE CHEST - 1 VIEW  COMPARISON:  11/02/2013 and earlier.  FINDINGS: Portable AP semi upright view at 0520 hrs. Stable tracheostomy tube. Enteric feeding tube courses to the abdomen, appears looped and tip at the gastric fundus. Stable left PICC line. Stable right chest tube.  A small to moderate pneumothorax now is visible on the right, with pleural edge seen along the entire right lung periphery. New right supraclavicular subcutaneous gas.  Confluent right perihilar and left upper lobe pulmonary opacity, not significantly changed. No definite pleural effusion. Stable cardiac size and mediastinal contours.  IMPRESSION: 1.  Stable lines and tubes.  Right chest tube remains in place. 2. Small to moderate right pneumothorax now visible. New right supraclavicular subcutaneous gas. 3. Stable lung parenchyma with confluent opacities favored to be pneumonia.   Electronically Signed   By: Augusto GambleLee  Hall M.D.   On: 11/03/2013 07:35   Dg Chest Port 1 View  11/02/2013   CLINICAL DATA:  Assess tracheostomy hand airspace disease  EXAM: PORTABLE CHEST - 1 VIEW  COMPARISON:  Chest x-ray from yesterday  FINDINGS: Stable, unremarkable positioning of tracheostomy tube, feeding tube, left upper extremity PICC, and right-sided chest tube.  Unchanged mild cardiomegaly. Unchanged upper mediastinal contours. Bilateral interstitial and airspace opacity is unchanged in this patient with PCP pneumonia. Right apical pneumothorax is not seen today. Resolved subcutaneous gas.  IMPRESSION: 1. Unchanged positioning of tubes and lines. 2. Unchanged bilateral pneumonia. 3. No visible pneumothorax.   Electronically Signed   By: Tiburcio PeaJonathan  Watts M.D.   On: 11/02/2013 05:49    Assessment/Plan: 1) Respiratory failure - trying to wean without success.  2) HIV/AIDS - on appropriate meds, prophylaxis.  Fluconazole weekly.   3)  Fungemia - finished therapy.  4) Fever - now WBC elevated, some  decrease this am.  This may be due to the pneumothorax?  I have not yet started antibiotics with just low grade fever.  No WBCs on respiratory culture from BAL.  Blood cultures sent.   5)  Disposition - THE FAMILY  HAS AGREED TO PALLIATIVE CARE CONSULT TODAY      Gardiner Barefootobert W Comer, MD [facility] [email or web address] [phone] pager   [phone] cell 11/03/2013, 8:47 AM

## 2013-11-04 DIAGNOSIS — Z515 Encounter for palliative care: Secondary | ICD-10-CM

## 2013-11-04 LAB — BASIC METABOLIC PANEL
BUN: 34 mg/dL — ABNORMAL HIGH (ref 6–23)
CO2: >45 mEq/L (ref 19–32)
Calcium: 8.4 mg/dL (ref 8.4–10.5)
Chloride: 89 mEq/L — ABNORMAL LOW (ref 96–112)
Creatinine, Ser: 0.38 mg/dL — ABNORMAL LOW (ref 0.50–1.35)
GFR calc non Af Amer: >90 mL/min (ref >90)
Glucose, Bld: 147 mg/dL — ABNORMAL HIGH (ref 70–99)
POTASSIUM: 4 meq/L (ref 3.7–5.3)
SODIUM: 140 meq/L (ref 137–147)

## 2013-11-04 LAB — GLUCOSE, CAPILLARY
GLUCOSE-CAPILLARY: 186 mg/dL — AB (ref 70–99)
GLUCOSE-CAPILLARY: 120 mg/dL — AB (ref 70–99)
Glucose-Capillary: 118 mg/dL — ABNORMAL HIGH (ref 70–99)

## 2013-11-04 LAB — CULTURE, BAL-QUANTITATIVE W GRAM STAIN

## 2013-11-04 LAB — CULTURE, BAL-QUANTITATIVE: Gram Stain: NONE SEEN

## 2013-11-04 NOTE — Progress Notes (Signed)
PULMONARY / CRITICAL CARE MEDICINE   Name: William Bender MRN: [id number] DOB: 1965/11/19    ADMISSION DATE:  09/28/2013 CONSULTATION DATE:  10/18/2013  REFERRING MD :  EDP  CHIEF COMPLAINT:  Shortness of breath, fever, cough  BRIEF PATIENT DESCRIPTION:  48 yo with recently diagnosis of HIV/AIDS (CD4 = 30 on 3/31) admitted 4/4 with acute hypoxemic respiratory failure and bilateral pneumonia. Of note PCP negative preadmission on Bactrim Px.  SIGNIFICANT / STUDIES EVENTS: 4/04 - Chest CTA:  No embolism, bilateral airspace disease 4/09 - Chest CT: Pneumomediastinum, small R pneumothorax, bilateral airspace disease 4/15 - RUE Venous Doppler: no DVT 4/18 - Echocardiogram: EF 60%, no RWMA 4/24 - Spontaneous R PTX > chest tube placed 4/24 - Trach tube placed 4/26 - BLE venous Dopplers: No DVT noted 4/26 - BUE venous Dopplers: No DVT noted 4/29 - placed back on IV dilaudid drip.PC increased to 25 and fio2 to 50% per float MD. 4/29 - Hgb 6.2. One unit PRBCs transfused 4/29 - [place] conference with wife, mother and father. Plan to continue full support but not escalate much from current level. Specifically discussed was possibility of ACLS in event of cardiac arrest and HD in event of acute renal failure. It was agreed that these would be excessive interventions in his current status 4/29 - started on methadone. Steroids empirically increased for non-resolving pneumonitis/ARDS. CD4 count remains < 30 4/30 - better vent synchrony with methadone started 4/29. Tolerating PS 10 cm H2O 5/01 - did not tolerate PSV 24 wean, tachypnea / agitation. Valproic acid added as adjunct for delirium 5/05 - periods of intermittent sedation / agitation.no weaning today due to poor effort.  Thick secretions 5/07 - Palliative Care meeting w Dr Hilma Favors, decision for withdrawal of care on     LINES / TUBES: ETT 4/5 >> 4/24 IJ CVL 4/5 >> 4/17 LUE PICC 4/17 >> 5/01 (ordered) Trach tube [name]) 4/24 >>  R chest tube 4/24 >>   LUE PICC - exchanged over wire 5/01 >>   MICRO: HIV 1 RNA Quant  3/17: 364,680  3/31: 13,616    4/11: 756  4/27: 53 AFB  BAL 4/05 >> smear neg >> NGTD Fungal BAL 4/05 >> smear neg >> NGTD Blood 4/04 >> NEG CMV BAL 4/05 >> NEG Bact BAL 4/05 >> NEG Pneumocystis DFA 4/05 >> NEG Blood 4/11 >> NEG AFB blood cx 4/11 >> NGTD RVP 4/11 >> PARAINFLUENZA 2 Blast Abs 4/13 >> NEG Histo Abs 4/13 >> NEG Fungal BAL 4/13 >> CANDIDA ALBICANS Bact BAL 4/13 >> NEG M TB complex PCR, BAL 4/13 >> NEG CMV DNA probe (serum) 4/13 >> POSITIVE (101,862) Blood 4/16 >> 1/2 CANDIDA ALBICANS Resp 4/16 >> NEG Urine 4/16  >> NEG Resp 4/20 >> NEG Blood 4/20 >> NEG Resp 422 >> NEG Blood 4/22 >> NEG C diff 4/23 >> NEG Urine 4/26 >> NEG Resp 4/26 >> NOF Blood 4/26 >> neg Resp 4/29 >> abundant GPC, abundant GNR >> NOF Blood 4/29 >>  C diff 5/1 >> negative RUL BAL PCP 5/6 >> negative RUL BAL 5/6 >>  BAL fungal 5/6 >>  BAL AFB 5/6 >>    ANTIBIOTICS: (all abx per ID) HAART Vanc 4/04 >> off Cefepime 4/04 >> off Micafungin 4/17 >> 4/24 Gancyclovir 4/6 >> 5/01 Fluconazole (fungemia) 4/24 >> 5/4 Fluconazole (px dosing) 5/4 >>  Azithromycin (MAC px) 4/19 >>  TMP-SMX (px dosage) 4/04 >>    SUBJECTIVE:   Appears more comfortable Family met with  Dr Hilma Favors 5/7  VITAL SIGNS: Temp:  [98.5 F (36.9 C)-99.9 F (37.7 C)] 99.8 F (37.7 C) (05/08 0800) Pulse Rate:  [82-147] 85 (05/08 1018) Resp:  [13-48] 29 (05/08 1018) BP: (97-156)/(55-99) 133/76 mmHg (05/08 1018) SpO2:  [91 %-100 %] 95 % (05/08 1018) FiO2 (%):  [40 %] 40 % (05/08 1018) Weight:  [60.4 kg (133 lb 2.5 oz)] 60.4 kg (133 lb 2.5 oz) (05/08 0544)  VENTILATOR SETTINGS: Vent Mode:  [-] PCV FiO2 (%):  [40 %] 40 % Set Rate:  [25 bmp] 25 bmp PEEP:  [5 cmH20] 5 cmH20 Plateau Pressure:  [24 cmH20-28 cmH20] 24 cmH20  INTAKE / OUTPUT: Intake/Output     05/07 0701 - 05/08 0700 05/08 0701 - 05/09 0700   I.V. (mL/kg) 427 (7.1) 54 (0.9)    Other     NG/GT 1635 135   Total Intake(mL/kg) 2062 (34.1) 189 (3.1)   Urine (mL/kg/hr) 2025 (1.4) 140 (0.7)   Stool 1 (0)    Chest Tube     Total Output 2026 140   Net +36 +49        Stool Occurrence  1 x    PHYSICAL EXAMINATION: Gen: vented, RASS -2, cachectic HEENT: #6 trach midline, secretions on trach dressing PULM: on PCV, scattered rhonchi, no air leak chest tube / to water seal CV: regular, no murmur AB: BS+, soft Ext: warm no edema Neuro: sleeping, sedated  LABS:  Recent Labs Lab 10/29/13 0638 11/01/13 0632  PHART 7.386 7.409  PCO2ART 60.2* 69.4*  PO2ART 88.9 71.2*  HCO3 35.0* 43.0*  TCO2 33.2 40.3  O2SAT 96.6 93.2    Recent Labs Lab 11/01/13 0535 11/02/13 0544 11/03/13 0500  HGB 8.5* 9.3* 9.0*  HCT 27.4* 30.4* 29.7*  WBC 14.9* 34.1* 24.9*  PLT 275 347 257    Recent Labs Lab 10/30/13 0540 10/31/13 0457 11/01/13 0535 11/02/13 0544 11/03/13 0500 11/04/13 0500  NA 142 139 138 139 141 140  K 4.6 4.4 4.5 4.3 4.2 4.0  CL 98 92* 90* 90* 90* 89*  CO2 41* 41* 43* 43* 44* >45*  GLUCOSE 160* 151* 171* 162* 168* 147*  BUN 22 22 24* 29* 33* 34*  CREATININE 0.33* 0.32* 0.36* 0.35* 0.35* 0.38*  CALCIUM 8.1* 8.5 8.4 8.6 8.8 8.4  MG 2.2  --  2.0  --  2.3  --   PHOS 2.8  --  3.7  --  3.7  --    No results found for this basename: INR,  in the last 168 hours  Recent Labs Lab 11/01/13 0535  AST 19  ALT 69*  ALKPHOS 80  BILITOT 0.2*  PROT 5.1*  ALBUMIN 1.8*    Dg Chest Port 1 View  11/03/2013   CLINICAL DATA:  48 year old male with respiratory failure. AIDS. Initial encounter.  EXAM: PORTABLE CHEST - 1 VIEW  COMPARISON:  11/02/2013 and earlier.  FINDINGS: Portable AP semi upright view at 0520 hrs. Stable tracheostomy tube. Enteric feeding tube courses to the abdomen, appears looped and tip at the gastric fundus. Stable left PICC line. Stable right chest tube.  A small to moderate pneumothorax now is visible on the right, with pleural edge seen along  the entire right lung periphery. New right supraclavicular subcutaneous gas.  Confluent right perihilar and left upper lobe pulmonary opacity, not significantly changed. No definite pleural effusion. Stable cardiac size and mediastinal contours.  IMPRESSION: 1.  Stable lines and tubes.  Right chest tube remains in place. 2. Small  to moderate right pneumothorax now visible. New right supraclavicular subcutaneous gas. 3. Stable lung parenchyma with confluent opacities favored to be pneumonia.   Electronically Signed   By: Lars Pinks M.D.   On: 11/03/2013 07:35    ASSESSMENT / PLAN:  PULMONARY A:  Prolonged VDRF ARDS - suspect steroid responsive component Parainfluenza virus PNA  Pneumomediastinum, resolved Spontaneous R pneumothorax Tracheostomy status, ? Some evidence trach suture skin breakdown Vent dyssynchrony Consider new PNA 5/6 P:   Vent settings reviewed - PCV 23, r 25, peep 5, 40%  Place chest tube on suction given CXR from 5/8 Diuresis as he can tolerate - 8.6 L positive for hosp as of 5/8; 48m IV bid and follow Freq CXR as long as chest tube in place; would be risky to remove when he is requiring high vent pressures Increased systemic steroids 4/29 > 833mq 12h, he has not historically tolerated decrease of steroids Family has met with Dr GoHilma Favorshas decided to transition to comfort approach. Dilaudid gtt increased with good effects. Plan will be to disconnect from MV om Monday 5/11.    CARDIOVASCULAR A:  Intermittent sinus tachycardia, reactive (fever, pain, etc) Abnormal EKG, resolved Hypertension, controlled P:  Cont to monitor BP and rhythm  RENAL A:   Hypernatremia, resolved Met alkalosis, mild P:   Stop labs Monitor I/Os  GASTROINTESTINAL A:   Protein calorie malnutrition P:   SUP: Pantoprazole per tube Continue TFs  HEMATOLOGIC A:   Anemia of critical illness without evidence of acute blood loss FOB Positive  P:  DVT px: LMWH No more transfusions.  Will not check CBC  INFECTIOUS A:   HIV/AIDS Parainfluenza viral pneumonitis Candidemia CMV viremia Intermittent fevers P:   Micro and as above, currently off abx - ID service managing, appreciate assistance Follow trach suture site > looks good, no abscess  ENDOCRINE A:   Steroid induced hyperglycemia P:   Stop SSI and CBG's on 5/8 Cont Lantus  NEUROLOGIC A:    Acute encephalopathy and agitation > on 5/2 very somnolent and unable to perform PSV, suspect due to adjusted sedation regimen. Dilaudid and versed gtt's added back 5/6 pm ICU associated discomfort P:   Goal RASS 0 to -1, currently -2 Methadone - started 4/29; decreased to 15 mg Q8 5/5 (goal to get to BID dosing) Clonazepam  Cont quetiapine > 10031mam, 200 qpm 5/4 and follow MS Cont clonidine patch as adjunctive therapy VPA 5/01 as adjunctive therapy > decrease to 250m79md on 5/3 Cont PRN lorazepam and hydromorphone   GLOBAL: -planning for withdrawal from MV on Monday 5/11. Will continue medical care until then. Simplify by d/c CBG's. Will place CT to suction 5/8 given small to mod PTX.     RobeBaltazar Apo, PhD 11/04/2013, 10:34 AM [facility] [phone] no answer [phone]

## 2013-11-04 NOTE — Progress Notes (Signed)
Dr. Golding, Dr. Wyatt and I met w/pt's family today to specifically tell the children about pt's condition and prognosis. Dr. Wyatt and I met with pt's wife first to ascertain how she may want begin the conversation with the children. [name] wanted staff to begin the conversation and explain pt's condition. During the meeting the children expressed their expectations of the father getting better, going to rehab and home by the end of summer. Dr. Golding explained pt's condition presently and that he is not expected to survive this illness. Pt's daughter and son were very tearful. [name] attempted to encourage them to be more vocal but daughter expressed that she did not want to talk. Daughter has waves of emotional outbursts of crying. Pt's son was a little more reserved but tearful. When the initial shock was calmed down more, pt's mother asked son if he would like to take a walk w/Chaplain. Pt's son and I went to cafeteria for snacks. During our conversation he cried more and seemed to be more relaxed about expressing what he was feeling. I allowed him to lead the conversation. Pt's daughter has friends. Pt's son does not seem to have friends from school or neighborhood for support. I will report same to Chaplain Lisa. Dr. Wyatt has graciously offered to be here on Monday to offer additional support as needed. Chaplain Lisa will be here as well. All are will to provide any support needed. Please call [phone]. °Pamela Carrington Holder °Chaplain ° 11/04/13 1700  °Clinical Encounter Type  °Visited With Family;Health care provider  ° °

## 2013-11-04 NOTE — Progress Notes (Signed)
11/04/13 1330  Clinical Encounter Type  Visited With Patient and family together (wife Harmon PierCaryn, mom Cordelia PenSherry)  Visit Type Follow-up;Spiritual support;Social support  Spiritual Encounters  Spiritual Needs Grief support;Emotional;Prayer;Ritual   Created a mini-ritual of making a card and reading it aloud to Caryn to mark her milestone birthday.  Even though celebrating her birthday tomorrow and Mother's Day on Sunday is a surreal and painful challenge for the family, honoring Caryn's uniqueness/strength/love and both Caryn's and Sherry's motherhood and parenting is emotionally and spiritually important.  Offered prayer at bedside to honor their love and forgiveness for Kipp BroodBrent and wishes for comfort, peace, and knowledge of God's love for him; the significance and challenges of celebrating the holidays this weekend; the significance of the meeting with Dr Lindie SpruceWyatt and the children this afternoon; and love and care for each family member by name.  Also served as a witness for Marsh & McLennanCaryn, as she invited me to read Kalix's personal Easter card to her, and for Cordelia PenSherry earlier, as she shared Yanuel's Merchandiser, retailaster reflection; these writings are deeply significant to the family's understanding of Kipp BroodBrent and his familial love and personal theology.  Naming, witnessing, affirming, and praying about all of these aspects of family life, story, and relationships are spiritually significant and sacred acts that will continue to help Ryatt's family with coping and healing.  [place] will continue to follow closely, but please page (24/7) as needs arise.  Thank you.  [address]Chaplain Jeromey Kruer [place]unde[place], [place] [phone]

## 2013-11-04 NOTE — Progress Notes (Signed)
Met family w/Dr. Hilma Favors for goals of care mtg. [name], mother, father were present and pt's brother was on speaker phone. Family members were in agreement to talk w/children Friday afternoon. I will contact child psychologist (Dr. Hulen Skains) about her availability to meet with [place]. Pt's family discussed plans w/Dr. Hilma Favors to remove vent on Monday and allow pt to have full comfort care. Pt's family each agreed this is what pt would want. [name] has a birthday Saturday and Mother's Day is Sunday. Pt's family asked questions about funeral homes. They discussed have pt cremated and having a celebration of life later. I gave pt's wife information from [employer] [facility] to help w/conversations w/children. At this point, a meeting is planned for 3:30 Friday w/children and child psychologist present w/Dr. Hilma Favors. Ernest Haber Chaplain

## 2013-11-04 NOTE — Progress Notes (Signed)
Palliative Care Team at [facility] Note   SUBJECTIVE: William Bender continues to need high levels of sedation for comfort on the vent. Significant neurological decline since last weekend.   Interval Events: Palliative GOC meeting 5/7- decision to remove mechanical ventilation on Monday.  OBJECTIVE: Vital Signs: BP 128/61  Pulse 113  Temp(Src) 99.5 F (37.5 C) (Oral)  Resp 28  Ht 5' 10"  (1.778 m)  Wt 60.4 kg (133 lb 2.5 oz)  BMI 19.11 kg/m2  SpO2 93%   Intake and Output: 05/07 0701 - 05/08 0700 In: 2062 [I.V.:427; NG/GT:1635] Out: 2026 [Urine:2025; Stool:1]  Physical Exam: Malnourished, frail, unresponsive on the vent  Allergies  Allergen Reactions  . Penicillins Rash    Medications: Scheduled Meds:  . antiseptic oral rinse  15 mL Mouth Rinse QID  . azithromycin  1,200 mg Per Tube Weekly  . chlorhexidine  15 mL Mouth Rinse BID  . clonazepam  1.5 mg Per Tube BID  . cloNIDine  0.1 mg Transdermal Weekly  . collagenase   Topical BID  . dolutegravir  50 mg Oral Daily  . emtricitabine-tenofovir  1 tablet Per Tube Daily  . enoxaparin (LOVENOX) injection  40 mg Subcutaneous Daily  . feeding supplement (OXEPA)  1,000 mL Per Tube Q24H  . feeding supplement (PRO-STAT SUGAR FREE 64)  30 mL Per Tube QID  . [START ON 14-Nov-2013] fluconazole  200 mg Per Tube Weekly  . free water  200 mL Per Tube Q4H  . furosemide  40 mg Intravenous BID  . insulin glargine  25 Units Subcutaneous Daily  . methadone  15 mg Per Tube 3 times per day  . methylPREDNISolone (SOLU-MEDROL) injection  80 mg Intravenous Q12H  . metoprolol tartrate  12.5 mg Per Tube BID  . multivitamin  5 mL Per Tube Daily  . pantoprazole sodium  40 mg Per Tube Daily  . QUEtiapine  100 mg Per Tube q morning - 10a  . QUEtiapine  200 mg Per Tube QHS  . sodium chloride  10-40 mL Intracatheter Q12H  . sulfamethoxazole-trimethoprim  1 tablet Per Tube Daily  . Valproic Acid  250 mg Per Tube BID    Continuous  Infusions: . HYDROmorphone 2 mg/hr (11/03/13 2002)  . midazolam (VERSED) infusion 4 mg/hr (11/04/13 1600)    PRN Meds: acetaminophen (TYLENOL) oral liquid 160 mg/5 mL, albuterol, hydrALAZINE, HYDROmorphone (DILAUDID) injection, LORazepam, sodium chloride  Labs: CBC    Component Value Date/Time   WBC 24.9* 11/03/2013 0500   RBC 3.11* 11/03/2013 0500   HGB 9.0* 11/03/2013 0500   HCT 29.7* 11/03/2013 0500   PLT 257 11/03/2013 0500   MCV 95.5 11/03/2013 0500   MCH 28.9 11/03/2013 0500   MCHC 30.3 11/03/2013 0500   RDW 18.8* 11/03/2013 0500   LYMPHSABS 0.3* 10/24/2013 0500   MONOABS 0.2 10/24/2013 0500   EOSABS 0.0 10/24/2013 0500   BASOSABS 0.0 10/24/2013 0500    CMET     Component Value Date/Time   NA 140 11/04/2013 0500   K 4.0 11/04/2013 0500   CL 89* 11/04/2013 0500   CO2 >45* 11/04/2013 0500   GLUCOSE 147* 11/04/2013 0500   BUN 34* 11/04/2013 0500   CREATININE 0.38* 11/04/2013 0500   CREATININE 0.89 09/27/2013 1114   CALCIUM 8.4 11/04/2013 0500   PROT 5.1* 11/01/2013 0535   ALBUMIN 1.8* 11/01/2013 0535   AST 19 11/01/2013 0535   ALT 69* 11/01/2013 0535   ALKPHOS 80 11/01/2013 0535   BILITOT 0.2* 11/01/2013 0535  GFRNONAA >90 11/04/2013 0500   GFRAA >90 11/04/2013 0500   ASSESSMENT/ PLAN: 48 yo man with complex hospital course with ARDS, viral PNA and prolonged VDRF in setting of newly diagnosed HIV infection.   Difficult family situation- I met with his family again including his two children to tell them that their dad was dying. Appreciate greatly Dr. Hulen Skains and Ree Edman, Chaplain being present for this family and these children.   Plan is for RMV on Monday AM.   Time: 3:30-430PM. Greater than 50%  of this time was spent counseling and coordinating care related to the above assessment and plan.   Acquanetta Chain, DO  11/04/2013, 5:13 PM  Please contact Palliative Medicine Team phone at [phone] for questions and concerns.

## 2013-11-04 NOTE — Progress Notes (Signed)
[facility]  Date of Admission:  10/21/2013  Antibiotics: Tivicay/Truvada Bactrim Prophylaxis Azithromycin prophylaxis   Subjective: Had discussion with Palliative Care  Objective: Temp:  [98.5 F (36.9 C)-99.9 F (37.7 C)] 99.5 F (37.5 C) (05/08 1200) Pulse Rate:  [82-147] 112 (05/08 1400) Resp:  [13-48] 27 (05/08 1400) BP: (97-156)/(55-99) 121/65 mmHg (05/08 1400) SpO2:  [91 %-100 %] 93 % (05/08 1400) FiO2 (%):  [40 %] 40 % (05/08 1308) Weight:  [133 lb 2.5 oz (60.4 kg)] 133 lb 2.5 oz (60.4 kg) (05/08 0544)  No exam  Lab Results Lab Results  Component Value Date   WBC 24.9* 11/03/2013   HGB 9.0* 11/03/2013   HCT 29.7* 11/03/2013   MCV 95.5 11/03/2013   PLT 257 11/03/2013    Lab Results  Component Value Date   CREATININE 0.38* 11/04/2013   BUN 34* 11/04/2013   NA 140 11/04/2013   K 4.0 11/04/2013   CL 89* 11/04/2013   CO2 >45* 11/04/2013    Lab Results  Component Value Date   ALT 69* 11/01/2013   AST 19 11/01/2013   ALKPHOS 80 11/01/2013   BILITOT 0.2* 11/01/2013      Microbiology: Recent Results (from the past 240 hour(s))  CULTURE, RESPIRATORY (NON-EXPECTORATED)     Status: None   Collection Time    10/26/13  5:30 PM      Result Value Ref Range Status   Specimen Description TRACHEAL ASPIRATE   Final   Special Requests NONE   Final   Gram Stain     Final   Value: MODERATE WBC PRESENT,BOTH PMN AND MONONUCLEAR     RARE SQUAMOUS EPITHELIAL CELLS PRESENT     ABUNDANT GRAM POSITIVE COCCI IN PAIRS     ABUNDANT GRAM NEGATIVE RODS     Performed at [facility]   Culture     Final   Value: Non-Pathogenic Oropharyngeal-type Flora Isolated.     Performed at [facility]   Report Status 10/29/2013 FINAL   Final  CULTURE, BLOOD (ROUTINE X 2)     Status: None   Collection Time    10/26/13  6:25 PM      Result Value Ref Range Status   Specimen Description BLOOD RIGHT ARM   Final   Special Requests BOTTLES DRAWN AEROBIC AND ANAEROBIC 5CC    Final   Culture  Setup Time     Final   Value: 10/27/2013 01:59     Performed at [facility]   Culture     Final   Value: NO GROWTH 5 DAYS     Performed at [facility]   Report Status 11/02/2013 FINAL   Final  CULTURE, BLOOD (ROUTINE X 2)     Status: None   Collection Time    10/26/13  6:31 PM      Result Value Ref Range Status   Specimen Description BLOOD RIGHT HAND   Final   Special Requests BOTTLES DRAWN AEROBIC ONLY 3CC   Final   Culture  Setup Time     Final   Value: 10/27/2013 01:59     Performed at [facility]   Culture     Final   Value: NO GROWTH 5 DAYS     Performed at [facility]   Report Status 11/02/2013 FINAL   Final  CLOSTRIDIUM DIFFICILE BY PCR     Status: None   Collection Time    10/28/13  1:23  PM      Result Value Ref Range Status   C difficile by pcr NEGATIVE  NEGATIVE Final   Comment: Performed at [facility]  AFB CULTURE WITH SMEAR     Status: None   Collection Time    11/02/13 12:55 PM      Result Value Ref Range Status   Specimen Description BRONCHIAL ALVEOLAR LAVAGE   Final   Special Requests NONE   Final   Acid Fast Smear     Final   Value: NO ACID FAST BACILLI SEEN     Performed at [facility]   Culture     Final   Value: CULTURE WILL BE EXAMINED FOR 6 WEEKS BEFORE ISSUING A FINAL REPORT     Performed at [facility]   Report Status PENDING   Incomplete  CULTURE, BAL-QUANTITATIVE     Status: None   Collection Time    11/02/13 12:55 PM      Result Value Ref Range Status   Specimen Description BRONCHIAL ALVEOLAR LAVAGE   Final   Special Requests NONE   Final   Gram Stain     Final   Value: NO WBC SEEN     NO SQUAMOUS EPITHELIAL CELLS SEEN     NO ORGANISMS SEEN     Performed at [facility] Count     Final   Value: 25,000 COLONIES/ML     Performed at [facility]   Culture     Final   Value: Non-Pathogenic Oropharyngeal-type Flora Isolated.      Performed at [facility]   Report Status 11/04/2013 FINAL   Final  FUNGUS CULTURE W SMEAR     Status: None   Collection Time    11/02/13 12:55 PM      Result Value Ref Range Status   Specimen Description BRONCHIAL WASHINGS   Final   Special Requests NONE   Final   Fungal Smear     Final   Value: NO YEAST OR FUNGAL ELEMENTS SEEN     Performed at [facility]   Culture     Final   Value: CULTURE IN PROGRESS FOR FOUR WEEKS     Performed at [facility]   Report Status PENDING   Incomplete  PNEUMOCYSTIS JIROVECI SMEAR BY DFA     Status: None   Collection Time    11/02/13 12:55 PM      Result Value Ref Range Status   Specimen Source-[facility]SRC BRONCHIAL WASHINGS   Final   Pneumocystis jiroveci Ag NEGATIVE   Final   Comment: Performed at [facility]  CULTURE, BLOOD (ROUTINE X 2)     Status: None   Collection Time    11/03/13  2:43 AM      Result Value Ref Range Status   Specimen Description BLOOD RAC   Final   Special Requests BOTTLES DRAWN AEROBIC AND ANAEROBIC 5CC   Final   Culture  Setup Time     Final   Value: 11/03/2013 09:01     Performed at [facility]   Culture     Final   Value:        BLOOD CULTURE RECEIVED NO GROWTH TO DATE CULTURE WILL BE HELD FOR 5 DAYS BEFORE ISSUING A FINAL NEGATIVE REPORT     Performed at [facility]   Report Status PENDING   Incomplete  CULTURE, BLOOD (ROUTINE X 2)  Status: None   Collection Time    11/03/13  2:45 AM      Result Value Ref Range Status   Specimen Description BLOOD RIGHT ARM   Final   Special Requests BOTTLES DRAWN AEROBIC AND ANAEROBIC 5CC   Final   Culture  Setup Time     Final   Value: 11/03/2013 09:01     Performed at [facility]   Culture     Final   Value:        BLOOD CULTURE RECEIVED NO GROWTH TO DATE CULTURE WILL BE HELD FOR 5 DAYS BEFORE ISSUING A FINAL NEGATIVE REPORT     Performed at [facility]   Report Status PENDING   Incomplete     Studies/Results: Dg Chest Port 1 View  11/03/2013   CLINICAL DATA:  48 year old male with respiratory failure. AIDS. Initial encounter.  EXAM: PORTABLE CHEST - 1 VIEW  COMPARISON:  11/02/2013 and earlier.  FINDINGS: Portable AP semi upright view at 0520 hrs. Stable tracheostomy tube. Enteric feeding tube courses to the abdomen, appears looped and tip at the gastric fundus. Stable left PICC line. Stable right chest tube.  A small to moderate pneumothorax now is visible on the right, with pleural edge seen along the entire right lung periphery. New right supraclavicular subcutaneous gas.  Confluent right perihilar and left upper lobe pulmonary opacity, not significantly changed. No definite pleural effusion. Stable cardiac size and mediastinal contours.  IMPRESSION: 1.  Stable lines and tubes.  Right chest tube remains in place. 2. Small to moderate right pneumothorax now visible. New right supraclavicular subcutaneous gas. 3. Stable lung parenchyma with confluent opacities favored to be pneumonia.   Electronically Signed   By: Augusto GambleLee  Hall M.D.   On: 11/03/2013 07:35    Assessment/Plan:  Appreciate palliative care/Dr. Lamar BlinksGolding's help.  To consider withdrawal of care Monday.    I am available as needed.   Thanks    Gardiner Barefootobert W Comer, MD [facility] [email or web address] [phone] pager   [phone] cell 11/04/2013, 3:10 PM

## 2013-11-04 NOTE — Progress Notes (Signed)
11/04/13 1130  Clinical Encounter Type  Visited With Family (parents William Bender and William Bender)  Visit Type Follow-up;Spiritual support;Social support  Spiritual Encounters  Spiritual Needs Grief support;Emotional   Made lengthy visit with parents William Bender and William Bender, providing further pastoral support, anticipatory bereavement care, conversation about arrangements and memorial, space to process feelings about planned meeting with Dr Colvin CaroliKathryn Wyatt (psychologist, peds/MC) to share POC with children this afternoon, and the normalcy of everyday conversation.  Family continues to use [place] very well in coping and working toward healing.  As always, please page 24/7 as needs arise:  [phone].  Thank you.  [address]Chaplain William Bender, [place]iv [phone]

## 2013-11-05 LAB — GLUCOSE, CAPILLARY: Glucose-Capillary: 182 mg/dL — ABNORMAL HIGH (ref 70–99)

## 2013-11-05 MED ORDER — METHYLPREDNISOLONE SODIUM SUCC 125 MG IJ SOLR
60.0000 mg | Freq: Two times a day (BID) | INTRAMUSCULAR | Status: DC
  Administered 2013-11-05 – 2013-11-06 (×2): 60 mg via INTRAVENOUS
  Filled 2013-11-05 (×4): qty 0.96

## 2013-11-05 NOTE — Progress Notes (Signed)
eLink Physician-Brief Progress Note Patient Name: William Bender DOB: 07/06/1965 MRN: [id number]  Date of Service  11/05/2013   HPI/Events of Note   Informed by RT that cuff of trach requiring 45-50 pressure to maintain good seal. RTs not allowed to change trach when attendings not in house after 5.   eICU Interventions   Will inform morning team to order trach change.    Intervention Category Major Interventions: Airway management  Jenelle Magesdam R. Exavier Lina 11/05/2013, 10:21 PM

## 2013-11-05 NOTE — Progress Notes (Addendum)
[facility]anger [facility] high [facility] pressures with trach.  Pressures of greater than 40 cm h2o required to achieve adequate volumes with minimal cuff leak.

## 2013-11-05 NOTE — Progress Notes (Signed)
PULMONARY / CRITICAL CARE MEDICINE   Name: William Bender MRN: [id number] DOB: 03-21-1966    ADMISSION DATE:  09/29/2013 CONSULTATION DATE:  10/13/2013  REFERRING MD :  EDP  CHIEF COMPLAINT:  Shortness of breath, fever, cough  BRIEF PATIENT DESCRIPTION:  49 yo with recently diagnosis of HIV/AIDS (CD4 = 30 on 3/31) admitted 4/4 with acute hypoxemic respiratory failure and bilateral pneumonia. Of note PCP negative preadmission on Bactrim Px.  SIGNIFICANT / STUDIES EVENTS: 4/04 - Chest CTA:  No embolism, bilateral airspace disease 4/09 - Chest CT: Pneumomediastinum, small R pneumothorax, bilateral airspace disease 4/15 - RUE Venous Doppler: no DVT 4/18 - Echocardiogram: EF 60%, no RWMA 4/24 - Spontaneous R PTX > chest tube placed 4/24 - Trach tube placed 4/26 - BLE venous Dopplers: No DVT noted 4/26 - BUE venous Dopplers: No DVT noted 4/29 - placed back on IV dilaudid drip.PC increased to 25 and fio2 to 50% per float MD. 4/29 - Hgb 6.2. One unit PRBCs transfused 4/29 - [place] conference with wife, mother and father. Plan to continue full support but not escalate much from current level. Specifically discussed was possibility of ACLS in event of cardiac arrest and HD in event of acute renal failure. It was agreed that these would be excessive interventions in his current status 4/29 - started on methadone. Steroids empirically increased for non-resolving pneumonitis/ARDS. CD4 count remains < 30 4/30 - better vent synchrony with methadone started 4/29. Tolerating PS 10 cm H2O 5/01 - did not tolerate PSV 24 wean, tachypnea / agitation. Valproic acid added as adjunct for delirium 5/05 - periods of intermittent sedation / agitation.no weaning today due to poor effort.  Thick secretions 5/07 - Palliative Care meeting w Dr Hilma Favors, decision for withdrawal of care on   LINES / TUBES: ETT 4/5 >> 4/24 IJ CVL 4/5 >> 4/17 LUE PICC 4/17 >> 5/01 (ordered) Trach tube [name]) 4/24 >>  R chest tube 4/24 >>   LUE PICC - exchanged over wire 5/01 >>   MICRO: HIV 1 RNA Quant  3/17: 456,256  3/31: 13,616    4/11: 756  4/27: 53 AFB  BAL 4/05 >> smear neg >> NGTD Fungal BAL 4/05 >> smear neg >> NGTD Blood 4/04 >> NEG CMV BAL 4/05 >> NEG Bact BAL 4/05 >> NEG Pneumocystis DFA 4/05 >> NEG Blood 4/11 >> NEG AFB blood cx 4/11 >> NGTD RVP 4/11 >> PARAINFLUENZA 2 Blast Abs 4/13 >> NEG Histo Abs 4/13 >> NEG Fungal BAL 4/13 >> CANDIDA ALBICANS Bact BAL 4/13 >> NEG M TB complex PCR, BAL 4/13 >> NEG CMV DNA probe (serum) 4/13 >> POSITIVE (101,862) Blood 4/16 >> 1/2 CANDIDA ALBICANS Resp 4/16 >> NEG Urine 4/16  >> NEG Resp 4/20 >> NEG Blood 4/20 >> NEG Resp 422 >> NEG Blood 4/22 >> NEG C diff 4/23 >> NEG Urine 4/26 >> NEG Resp 4/26 >> NOF Blood 4/26 >> neg Resp 4/29 >> abundant GPC, abundant GNR >> NOF Blood 4/29 >>  C diff 5/1 >> negative RUL BAL PCP 5/6 >> negative RUL BAL 5/6 >>  BAL fungal 5/6 >>  BAL AFB 5/6 >>    ANTIBIOTICS: (all abx per ID) HAART Vanc 4/04 >> off Cefepime 4/04 >> off Micafungin 4/17 >> 4/24 Gancyclovir 4/6 >> 5/01 Fluconazole (fungemia) 4/24 >> 5/4 Fluconazole (px dosing) 5/4 >>  Azithromycin (MAC px) 4/19 >>  TMP-SMX (px dosage) 4/04 >>    SUBJECTIVE:  dilauded drip  VITAL SIGNS: Temp:  [99.5 F (  37.5 C)-103 F (39.4 C)] 102.7 F (39.3 C) (05/09 0350) Pulse Rate:  [82-129] 110 (05/09 0600) Resp:  [15-35] 25 (05/09 0600) BP: (115-155)/(57-84) 118/75 mmHg (05/09 0600) SpO2:  [92 %-99 %] 94 % (05/09 0600) FiO2 (%):  [40 %] 40 % (05/09 0800)  VENTILATOR SETTINGS: Vent Mode:  [-] PCV FiO2 (%):  [40 %] 40 % Set Rate:  [25 bmp] 25 bmp PEEP:  [5 cmH20] 5 cmH20 Plateau Pressure:  [20 cmH20-27 cmH20] 21 cmH20  INTAKE / OUTPUT: Intake/Output     05/08 0701 - 05/09 0700 05/09 0701 - 05/10 0700   I.V. (mL/kg) 396 (6.6)    NG/GT 1790    Total Intake(mL/kg) 2186 (36.2)    Urine (mL/kg/hr) 1810 (1.2)    Stool     Total Output 1810     Net +376           Stool Occurrence 2 x     PHYSICAL EXAMINATION: Gen: vented, RASS -2, cachectic HEENT: #6 trach midline, secretions on trach dressing PULM: coarse throughout CV: regular, no murmur AB: BS+, soft Ext: warm no edema Neuro: sleeping, sedated  LABS:  Recent Labs Lab 11/01/13 0632  PHART 7.409  PCO2ART 69.4*  PO2ART 71.2*  HCO3 43.0*  TCO2 40.3  O2SAT 93.2    Recent Labs Lab 11/01/13 0535 11/02/13 0544 11/03/13 0500  HGB 8.5* 9.3* 9.0*  HCT 27.4* 30.4* 29.7*  WBC 14.9* 34.1* 24.9*  PLT 275 347 257    Recent Labs Lab 10/30/13 0540 10/31/13 0457 11/01/13 0535 11/02/13 0544 11/03/13 0500 11/04/13 0500  NA 142 139 138 139 141 140  K 4.6 4.4 4.5 4.3 4.2 4.0  CL 98 92* 90* 90* 90* 89*  CO2 41* 41* 43* 43* 44* >45*  GLUCOSE 160* 151* 171* 162* 168* 147*  BUN 22 22 24* 29* 33* 34*  CREATININE 0.33* 0.32* 0.36* 0.35* 0.35* 0.38*  CALCIUM 8.1* 8.5 8.4 8.6 8.8 8.4  MG 2.2  --  2.0  --  2.3  --   PHOS 2.8  --  3.7  --  3.7  --    No results found for this basename: INR,  in the last 168 hours  Recent Labs Lab 11/01/13 0535  AST 19  ALT 69*  ALKPHOS 80  BILITOT 0.2*  PROT 5.1*  ALBUMIN 1.8*    No results found.  ASSESSMENT / PLAN:  PULMONARY A:  Prolonged VDRF ARDS - suspect steroid responsive component Parainfluenza virus PNA  Pneumomediastinum, resolved Spontaneous R pneumothorax Tracheostomy status, ? Some evidence trach suture skin breakdown Vent dyssynchrony Consider new PNA 5/6 P:   PCV setting noted, keep same elevated MV rate, keep total pressures less than 30 consider SBT attempt high high PS 20-22 Chest tube to suction remain consideration, given last pcxr Diuresis dc as no labs following, no harm limited role roids, consider reduction to q12h Family has met with Dr Hilma Favors, has decided to transition to comfort approach. Dilaudid gtt increased with good effects. Plan will be to disconnect from MV om Monday 5/11.    CARDIOVASCULAR A:  Intermittent sinus tachycardia, reactive (fever, pain, etc) Abnormal EKG, resolved Hypertension, controlled P:  Cont to monitor BP and rhythm Dc metoprolol, did have some brady  2 nights ago Control temps  RENAL A:   Hypernatremia, resolved Met alkalosis, mild P:   Dc free water Dc lasix Limit blood draws, will not change outcome  GASTROINTESTINAL A:   Protein calorie malnutrition P:   SUP:  Pantoprazole per tube Continue TFs  HEMATOLOGIC A:   Anemia of critical illness without evidence of acute blood loss FOB Positive  P:  DVT px: LMWH Limit phlebotomy  INFECTIOUS A:   HIV/AIDS Parainfluenza viral pneumonitis Candidemia CMV viremia Intermittent fevers P:   Per ID  ENDOCRINE A:   Steroid induced hyperglycemia P:   Stop SSI and CBG's on 5/8 Dc Lantus  NEUROLOGIC A:    Acute encephalopathy and agitation > on 5/2 very somnolent and unable to perform PSV, suspect due to adjusted sedation regimen. Dilaudid and versed gtt's added back 5/6 pm ICU associated discomfort P:   Goal RASS 0 to -1, currently -2 Methadone -dc with dilauded Clonazepam  Cont quetiapine > 112m qam, 200 qpm 5/4 and follow MS Cont clonidine patch as adjunctive therapy VPA 5/01 as adjunctive therapy > decrease to 257mbid on 5/3 Cont PRN lorazepam and hydromorphone   GLOBAL: -planning for withdrawal from MV on Monday 5/11. Will continue medical care until then. Simplify meds, no harm, wean attempt  Ccm time 30 min   DaLavon PaganiniFeTitus MouldMD, FASpringfieldgr: [facility]

## 2013-11-05 NOTE — Plan of Care (Signed)
Problem: ICU Phase Progression Outcomes Goal: Initial discharge plan identified Outcome: Progressing Family has decided to withdraw ventilatory support on Monday, May 11th.

## 2013-11-06 MED ORDER — METHYLPREDNISOLONE SODIUM SUCC 40 MG IJ SOLR: 40.0000 mg | Freq: Two times a day (BID) | INTRAMUSCULAR | Status: DC

## 2013-11-09 LAB — CULTURE, BLOOD (ROUTINE X 2)
Culture: NO GROWTH
Culture: NO GROWTH

## 2013-11-14 LAB — AFB CULTURE WITH SMEAR (NOT AT ARMC): ACID FAST SMEAR: NONE SEEN

## 2013-11-15 ENCOUNTER — Telehealth: Payer: Self-pay | Admitting: Emergency Medicine

## 2013-11-15 NOTE — Telephone Encounter (Signed)
Dr. Delton CoombesByrum, the wife Blythe StanfordCaryn Ledbetter called and wanted to know if the underlying cause of death Acquired Immunodeficiency Syndrome could be removed from the death certificate.  She didn't want other people to find out.  Would you please let her know if you will be willing to redo the death certificate and remove the 3rd cause?  Please call her or have your nurse call her at [phone].

## 2013-11-16 NOTE — Telephone Encounter (Signed)
11/16/13-Please ignore previous request. I spoke to wife and informed her the Death Certificate is not a part of our Legal Medical Record. Any requests for alterations or changes must be pursued through the [place] Vital records division of the Department of Health and Human services or at the [place] of Deeds Office which can provide instructions to [place]'s procedures.  Upon receipt of her request they will evaluate her request and respond in writing. I provided William Bender with web site and phone information. We will only be involved if contacted by [place] [facility] we cannot perform alterations or corrections from verbal requests. rmf

## 2013-11-20 LAB — AFB CULTURE, BLOOD

## 2013-11-28 NOTE — Progress Notes (Signed)
Patient taken to morgue.  William Wert Debroah LoopArnold RN

## 2013-11-28 NOTE — Progress Notes (Signed)
PULMONARY / CRITICAL CARE MEDICINE   Name: William Bender MRN: [id number] DOB: 12/01/1965    ADMISSION DATE:  10/24/2013 CONSULTATION DATE:  10/22/2013  REFERRING MD :  EDP  CHIEF COMPLAINT:  Shortness of breath, fever, cough  BRIEF PATIENT DESCRIPTION:  48 yo with recently diagnosis of HIV/AIDS (CD4 = 30 on 3/31) admitted 4/4 with acute hypoxemic respiratory failure and bilateral pneumonia. Of note PCP negative preadmission on Bactrim Px.  SIGNIFICANT / STUDIES EVENTS: 4/04 - Chest CTA:  No embolism, bilateral airspace disease 4/09 - Chest CT: Pneumomediastinum, small R pneumothorax, bilateral airspace disease 4/15 - RUE Venous Doppler: no DVT 4/18 - Echocardiogram: EF 60%, no RWMA 4/24 - Spontaneous R PTX > chest tube placed 4/24 - Trach tube placed 4/26 - BLE venous Dopplers: No DVT noted 4/26 - BUE venous Dopplers: No DVT noted 4/29 - placed back on IV dilaudid drip.PC increased to 25 and fio2 to 50% per float MD. 4/29 - Hgb 6.2. One unit PRBCs transfused 4/29 - [place] conference with wife, mother and father. Plan to continue full support but not escalate much from current level. Specifically discussed was possibility of ACLS in event of cardiac arrest and HD in event of acute renal failure. It was agreed that these would be excessive interventions in his current status 4/29 - started on methadone. Steroids empirically increased for non-resolving pneumonitis/ARDS. CD4 count remains < 30 4/30 - better vent synchrony with methadone started 4/29. Tolerating PS 10 cm H2O 5/01 - did not tolerate PSV 24 wean, tachypnea / agitation. Valproic acid added as adjunct for delirium 5/05 - periods of intermittent sedation / agitation.no weaning today due to poor effort.  Thick secretions 5/07 - Palliative Care meeting w Dr Hilma Favors, decision for withdrawal of care on [place] 5/19- failed wean with high PS  LINES / TUBES: ETT 4/5 >> 4/24 IJ CVL 4/5 >> 4/17 LUE PICC 4/17 >> 5/01 (ordered) Trach tube  [name]) 4/24 >>  R chest tube 4/24 >>  LUE PICC - exchanged over wire 5/01 >>   MICRO: HIV 1 RNA Quant  3/17: 623,762  3/31: 13,616    4/11: 756  4/27: 53 AFB  BAL 4/05 >> smear neg >> NGTD Fungal BAL 4/05 >> smear neg >> NGTD Blood 4/04 >> NEG CMV BAL 4/05 >> NEG Bact BAL 4/05 >> NEG Pneumocystis DFA 4/05 >> NEG Blood 4/11 >> NEG AFB blood cx 4/11 >> NGTD RVP 4/11 >> PARAINFLUENZA 2 Blast Abs 4/13 >> NEG Histo Abs 4/13 >> NEG Fungal BAL 4/13 >> CANDIDA ALBICANS Bact BAL 4/13 >> NEG M TB complex PCR, BAL 4/13 >> NEG CMV DNA probe (serum) 4/13 >> POSITIVE (101,862) Blood 4/16 >> 1/2 CANDIDA ALBICANS Resp 4/16 >> NEG Urine 4/16  >> NEG Resp 4/20 >> NEG Blood 4/20 >> NEG Resp 422 >> NEG Blood 4/22 >> NEG C diff 4/23 >> NEG Urine 4/26 >> NEG Resp 4/26 >> NOF Blood 4/26 >> neg Resp 4/29 >> abundant GPC, abundant GNR >> NOF Blood 4/29 >>  C diff 5/1 >> negative RUL BAL PCP 5/6 >> negative RUL BAL 5/6 >>  BAL fungal 5/6 >>  BAL AFB 5/6 >>    ANTIBIOTICS: (all abx per ID) HAART Vanc 4/04 >> off Cefepime 4/04 >> off Micafungin 4/17 >> 4/24 Gancyclovir 4/6 >> 5/01 Fluconazole (fungemia) 4/24 >> 5/4 Fluconazole (px dosing) 5/4 >>  Azithromycin (MAC px) 4/19 >>  TMP-SMX (px dosage) 4/04 >>    SUBJECTIVE:  dilauded drip remains,  failed weaning, tachy  VITAL SIGNS: Temp:  [100 F (37.8 C)-103.2 F (39.6 C)] 103.1 F (39.5 C) (05/10 0400) Pulse Rate:  [125-148] 125 (05/10 0600) Resp:  [23-78] 68 (05/10 0600) BP: (63-137)/(23-81) 75/36 mmHg (05/10 0600) SpO2:  [90 %-96 %] 94 % (05/10 0600) FiO2 (%):  [40 %-50 %] 50 % (05/10 0801) Weight:  [58.9 kg (129 lb 13.6 oz)] 58.9 kg (129 lb 13.6 oz) (05/10 0400)  VENTILATOR SETTINGS: Vent Mode:  [-] PCV FiO2 (%):  [40 %-50 %] 50 % Set Rate:  [25 bmp] 25 bmp PEEP:  [5 cmH20] 5 cmH20 Pressure Support:  [22 cmH20] 22 cmH20 Plateau Pressure:  [24 cmH20-25 cmH20] 24 cmH20  INTAKE / OUTPUT: Intake/Output     05/09 0701 -  05/10 0700 05/10 0701 - 05/11 0700   I.V. (mL/kg) 401.6 (6.8)    NG/GT 1170    Total Intake(mL/kg) 1571.6 (26.7)    Urine (mL/kg/hr) 1150 (0.8)    Total Output 1150     Net +421.6          Stool Occurrence 1 x     PHYSICAL EXAMINATION: Gen: vented, RASS -3, cachectic HEENT: #6 trach midline, secretions on trach dressing PULM: coarse throughout, no crepitus CV: regular, no murmur AB: BS+, soft, no r/g Ext: warm no edema Neuro: sleeping, sedated  LABS:  Recent Labs Lab 11/01/13 0632  PHART 7.409  PCO2ART 69.4*  PO2ART 71.2*  HCO3 43.0*  TCO2 40.3  O2SAT 93.2    Recent Labs Lab 11/01/13 0535 11/02/13 0544 11/03/13 0500  HGB 8.5* 9.3* 9.0*  HCT 27.4* 30.4* 29.7*  WBC 14.9* 34.1* 24.9*  PLT 275 347 257    Recent Labs Lab 10/31/13 0457 11/01/13 0535 11/02/13 0544 11/03/13 0500 11/04/13 0500  NA 139 138 139 141 140  K 4.4 4.5 4.3 4.2 4.0  CL 92* 90* 90* 90* 89*  CO2 41* 43* 43* 44* >45*  GLUCOSE 151* 171* 162* 168* 147*  BUN 22 24* 29* 33* 34*  CREATININE 0.32* 0.36* 0.35* 0.35* 0.38*  CALCIUM 8.5 8.4 8.6 8.8 8.4  MG  --  2.0  --  2.3  --   PHOS  --  3.7  --  3.7  --    No results found for this basename: INR,  in the last 168 hours  Recent Labs Lab 11/01/13 0535  AST 19  ALT 69*  ALKPHOS 80  BILITOT 0.2*  PROT 5.1*  ALBUMIN 1.8*    No results found.  ASSESSMENT / PLAN:  PULMONARY A:  Prolonged VDRF ARDS - suspect steroid responsive component Parainfluenza virus PNA  Pneumomediastinum, resolved Spontaneous R pneumothorax Tracheostomy status, ? Some evidence trach suture skin breakdown Vent dyssynchrony Consider new PNA 5/6 P:   PCV setting noted, keep same elevated MV rate, keep total pressures less than 30 consider SBT attempt repeat PS 22-24 Chest tube to suction remain consideration limited role roids, consider reduction further Family has met with Dr Hilma Favors, has decided to transition to comfort approach. Dilaudid gtt increased  with good effects. Plan will be to disconnect from MV om Monday 5/11.   CARDIOVASCULAR A:  Intermittent sinus tachycardia, reactive (fever, pain, etc) Abnormal EKG, resolved Hypertension, controlled P:  Control temps  RENAL A:   Hypernatremia, resolved Met alkalosis, mild P:   No new labs, will not change outcome  GASTROINTESTINAL A:   Protein calorie malnutrition P:   SUP: Pantoprazole per tube Continue TFs, npo midnight for comfort  HEMATOLOGIC A:  Anemia of critical illness without evidence of acute blood loss FOB Positive  P:  DVT px: LMWH Limit phlebotomy  INFECTIOUS A:   HIV/AIDS Parainfluenza viral pneumonitis Candidemia CMV viremia Intermittent fevers P:   Per ID  ENDOCRINE A:   Steroid induced hyperglycemia P:   Avoid blood sticks  NEUROLOGIC A:    Acute encephalopathy and agitation > on 5/2 very somnolent and unable to perform PSV, suspect due to adjusted sedation regimen. Dilaudid and versed gtt's added back 5/6 pm ICU associated discomfort P:   Goal RASS 0 to -1, currently -2 dilauded Clonazepam  Cont quetiapine > 173m qam, 200 qpm 5/4 and follow MS Cont clonidine patch as adjunctive therapy, dcas BP dropped overnight VPA 5/01 as adjunctive therapy > decrease to 2547mbid on 5/3 Cont PRN lorazepam and hydromorphone   GLOBAL: Comfort in am likley , npo midnight, dc clonidine, wean attempt   DaLavon PaganiniFeTitus MouldMD, FARoscommongr: [facility]

## 2013-11-28 NOTE — Progress Notes (Signed)
Versed 45 cc discarded, and Dilaudid 40 cc discarded.  Observed waste per Lorrin JacksonAmy Claudia Greenley RN and Caesar Chestnutuby Johnson RN.

## 2013-11-28 NOTE — Progress Notes (Signed)
Patient with no spont resp, no palpable pulse, no heartbeat auscultated per 2 RN's, Momin Misko Hipolito BayleyArnold RN, and SunGarduby Johnson RN.  Family at bedside and Dr. Tyson AliasFeinstein  Notified of patient expiration.

## 2013-11-28 NOTE — Progress Notes (Signed)
Morgue care done for patient.  All lines d/c'd, including chest tube.

## 2013-11-28 DEATH — deceased

## 2013-12-05 LAB — FUNGUS CULTURE W SMEAR: FUNGAL SMEAR: NONE SEEN

## 2013-12-08 NOTE — Discharge Summary (Signed)
PULMONARY / CRITICAL CARE MEDICINE   Name: William Bender MRN: [id number] DOB: 03/17/1966    ADMISSION DATE:  10/21/2013 Date of death: 11/17/2013  CHIEF COMPLAINT:  Shortness of breath, fever, cough  Final cause of death:  Acute ventilator dependent respiratory failure  Secondary causes of death:  Acute respiratory distress syndrome Severe bilateral opportunistic pneumonia Parainfluenza pneumonia CMV positivity Candidemia Acquired immunodeficiency syndrome Right pneumothorax status-post chest tube Pneumomediastinum Toxic metabolic encephalopathy Anemia of critical illness Hypertension Metabolic disarray with hypernatremia, hypokalemia, and other electrolyte abnormalities Protein calorie malnutrition       BRIEF HOSPITAL COURSE:  Mr William Bender was a 48 yo man with recently diagnosed HIV/AIDS (CD4 = 30 on 09/27/13) admitted 4/4 with a fulminant, presumed opportunistic, infectious illness. He had bilateral diffuse infiltrates consistent with an opportunistic pneumonia and quickly developed acute hypoxemic respiratory failure. He was ventilated and treated broadly for all potential infectious etiologies including PCP or viral processes. He also received antiretroviral therapy. Testing revealed parainfluenza pneumonia. He quickly progressed to ARDS and required high PEEP and FiO2 to maintain stability. Course was complicated by pneumomediastinum and spontaneous right pneumothorax that was treated with a chest tube. He also had anemia of critical illness and protein calorie malnutrition. He required high sedation to tolerate his ventilatory mode and was on multiple mind altering medications that culminated in a toxic metabolic encephalopathy. His illness was prolonged due to to his ventilatory needs and he underwent tracheostomy to facilitate longer relation to see if he can improve. His ARDS was treated with diuresis and also empiric steroids without any significant change. He developed fungemia  and was treated with antifungals. Unfortunately despite prolonged ventilation and all aggressive measures Mr. William Bender was unable to be liberated from mechanical ventilation. He could not tolerate decrease sedation due to high ventilator needs. In discussion with the family and with Dr. Phillips OdorGolding of palliative care it was ultimately decided that his chances for survival were very small and that he should be transitioned to comfort care. His comfort medications were liberalized and he expired on 11/25/2013.   SIGNIFICANT / STUDIES EVENTS: 4/04 - Chest CTA:  No embolism, bilateral airspace disease 4/09 - Chest CT: Pneumomediastinum, small R pneumothorax, bilateral airspace disease 4/15 - RUE Venous Doppler: no DVT 4/18 - Echocardiogram: EF 60%, no RWMA 4/24 - Spontaneous R PTX > chest tube placed 4/24 - Trach tube placed 4/26 - BLE venous Dopplers: No DVT noted 4/26 - BUE venous Dopplers: No DVT noted 4/29 - placed back on IV dilaudid drip.PC increased to 25 and fio2 to 50% per float MD. 4/29 - Hgb 6.2. One unit PRBCs transfused 4/29 - [place] conference with wife, mother and father. Plan to continue full support but not escalate much from current level. Specifically discussed was possibility of ACLS in event of cardiac arrest and HD in event of acute renal failure. It was agreed that these would be excessive interventions in his current status 4/29 - started on methadone. Steroids empirically increased for non-resolving pneumonitis/ARDS. CD4 count remains < 30 4/30 - better vent synchrony with methadone started 4/29. Tolerating PS 10 cm H2O 5/01 - did not tolerate PSV 24 wean, tachypnea / agitation. Valproic acid added as adjunct for delirium 5/05 - periods of intermittent sedation / agitation.no weaning today due to poor effort.  Thick secretions 5/07 - Palliative Care meeting w Dr Phillips OdorGolding, decision for withdrawal of care on   LINES / TUBES: ETT 4/5 >> 4/24 IJ CVL 4/5 >> 4/17 LUE PICC 4/17 >>  5/01  (ordered) Trach tube Ninetta Lights) 4/24 >>  R chest tube 4/24 >>  LUE PICC - exchanged over wire 5/01 >>   MICRO: HIV 1 RNA Quant  3/17: 376,283  3/31: 13,616    4/11: 756  4/27: 53 AFB  BAL 4/05 >> smear neg >> NGTD Fungal BAL 4/05 >> smear neg >> NGTD Blood 4/04 >> NEG CMV BAL 4/05 >> NEG Bact BAL 4/05 >> NEG Pneumocystis DFA 4/05 >> NEG Blood 4/11 >> NEG AFB blood cx 4/11 >> NGTD RVP 4/11 >> PARAINFLUENZA 2 Blast Abs 4/13 >> NEG Histo Abs 4/13 >> NEG Fungal BAL 4/13 >> CANDIDA ALBICANS Bact BAL 4/13 >> NEG M TB complex PCR, BAL 4/13 >> NEG CMV DNA probe (serum) 4/13 >> POSITIVE (101,862) Blood 4/16 >> 1/2 CANDIDA ALBICANS Resp 4/16 >> NEG Urine 4/16  >> NEG Resp 4/20 >> NEG Blood 4/20 >> NEG Resp 422 >> NEG Blood 4/22 >> NEG C diff 4/23 >> NEG Urine 4/26 >> NEG Resp 4/26 >> NOF Blood 4/26 >> neg Resp 4/29 >> abundant GPC, abundant GNR >> NOF Blood 4/29 >>  C diff 5/1 >> negative RUL BAL PCP 5/6 >> negative RUL BAL 5/6 >>  BAL fungal 5/6 >>  BAL AFB 5/6 >>    ANTIBIOTICS: (all abx per ID) HAART Vanc 4/04 >> off Cefepime 4/04 >> off Micafungin 4/17 >> 4/24 Gancyclovir 4/6 >> 5/01 Fluconazole (fungemia) 4/24 >> 5/4 Fluconazole (px dosing) 5/4 >>  Azithromycin (MAC px) 4/19 >>  TMP-SMX (px dosage) 4/04 >>    Levy Pupa, MD, PhD 12/08/2013, 12:40 PM [facility] [phone] or if no answer [phone]

## 2013-12-15 LAB — AFB CULTURE WITH SMEAR (NOT AT ARMC): ACID FAST SMEAR: NONE SEEN

## 2016-02-12 IMAGING — CR DG CHEST 1V PORT
1 series · 1 of 1 positions shown · non-contrast
Comparison: DG CHEST 2 VIEW dated 09/13/2013; CT ANGIO CHEST W/CM
&/OR WO/CM dated 09/02/2013

CLINICAL DATA: Shortness of breath.

EXAM:
PORTABLE CHEST - 1 VIEW

[AP]
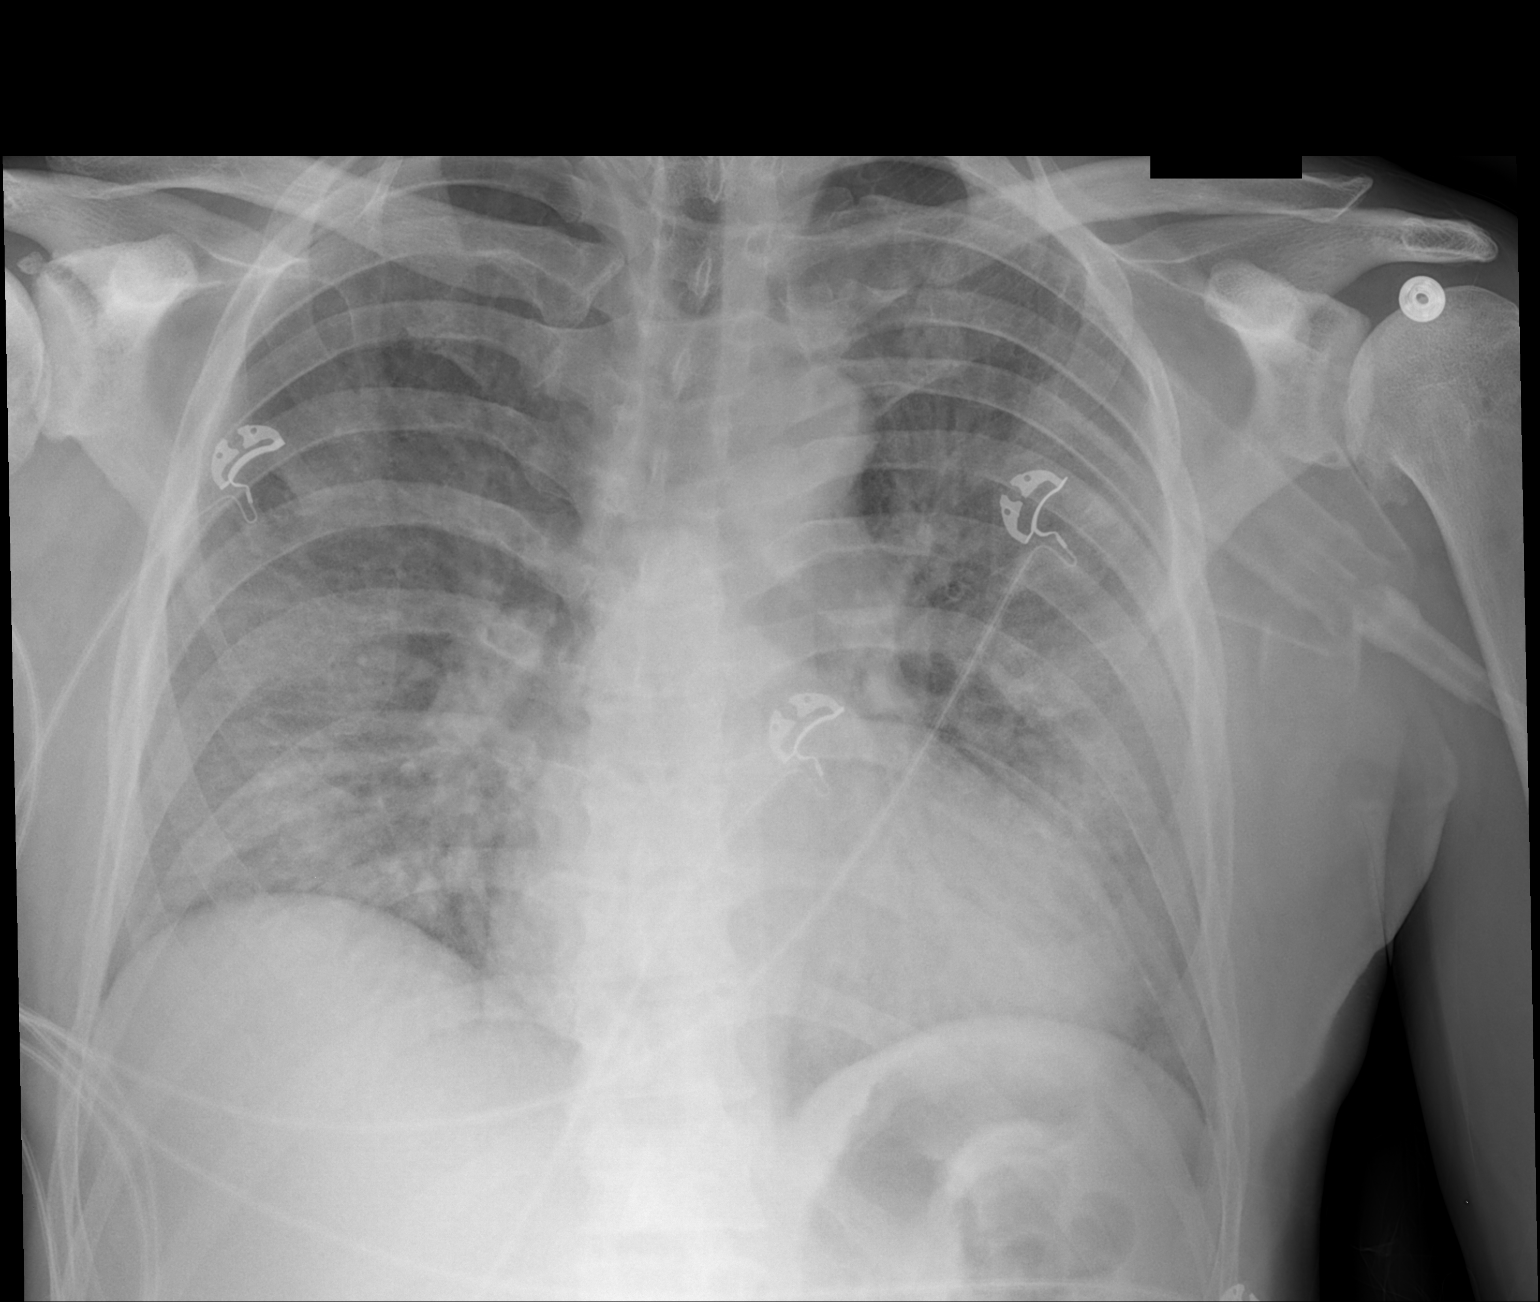

[1 of 1 positions shown; findings below may reference images not displayed]

FINDINGS: Mild cardiomegaly with indistinct pulmonary vasculature and
bilateral airspace opacities, mildly increased from the prior exam.

Degenerative spurring both glenohumeral joints.
IMPRESSION: 1. Cardiomegaly with indistinct pulmonary vasculature in bilateral
indistinct airspace opacities, mildly worsened from prior. I have
reviewed the patient's chart and note the recent diagnosis of HIV
with low lymphocytes, grade 1 diastolic dysfunction on the
echocardiography, and mildly elevated BNP. From a purely
radiographic standpoint, the appearance on today's exam could be a
manifestation of mild pulmonary edema or atypical pneumonia with
incidental mild cardiomegaly.

## 2016-02-29 IMAGING — CR DG CHEST 1V PORT
1 series · 1 of 1 positions shown · non-contrast
Comparison: DG CHEST 2 VIEW dated 09/19/2013

CLINICAL DATA: TACHYCARDIA SHORTNESS OF BREATH TACHYCARDIA
SHORTNESS OF BREATH

EXAM:
PORTABLE CHEST - 1 VIEW

[AP]
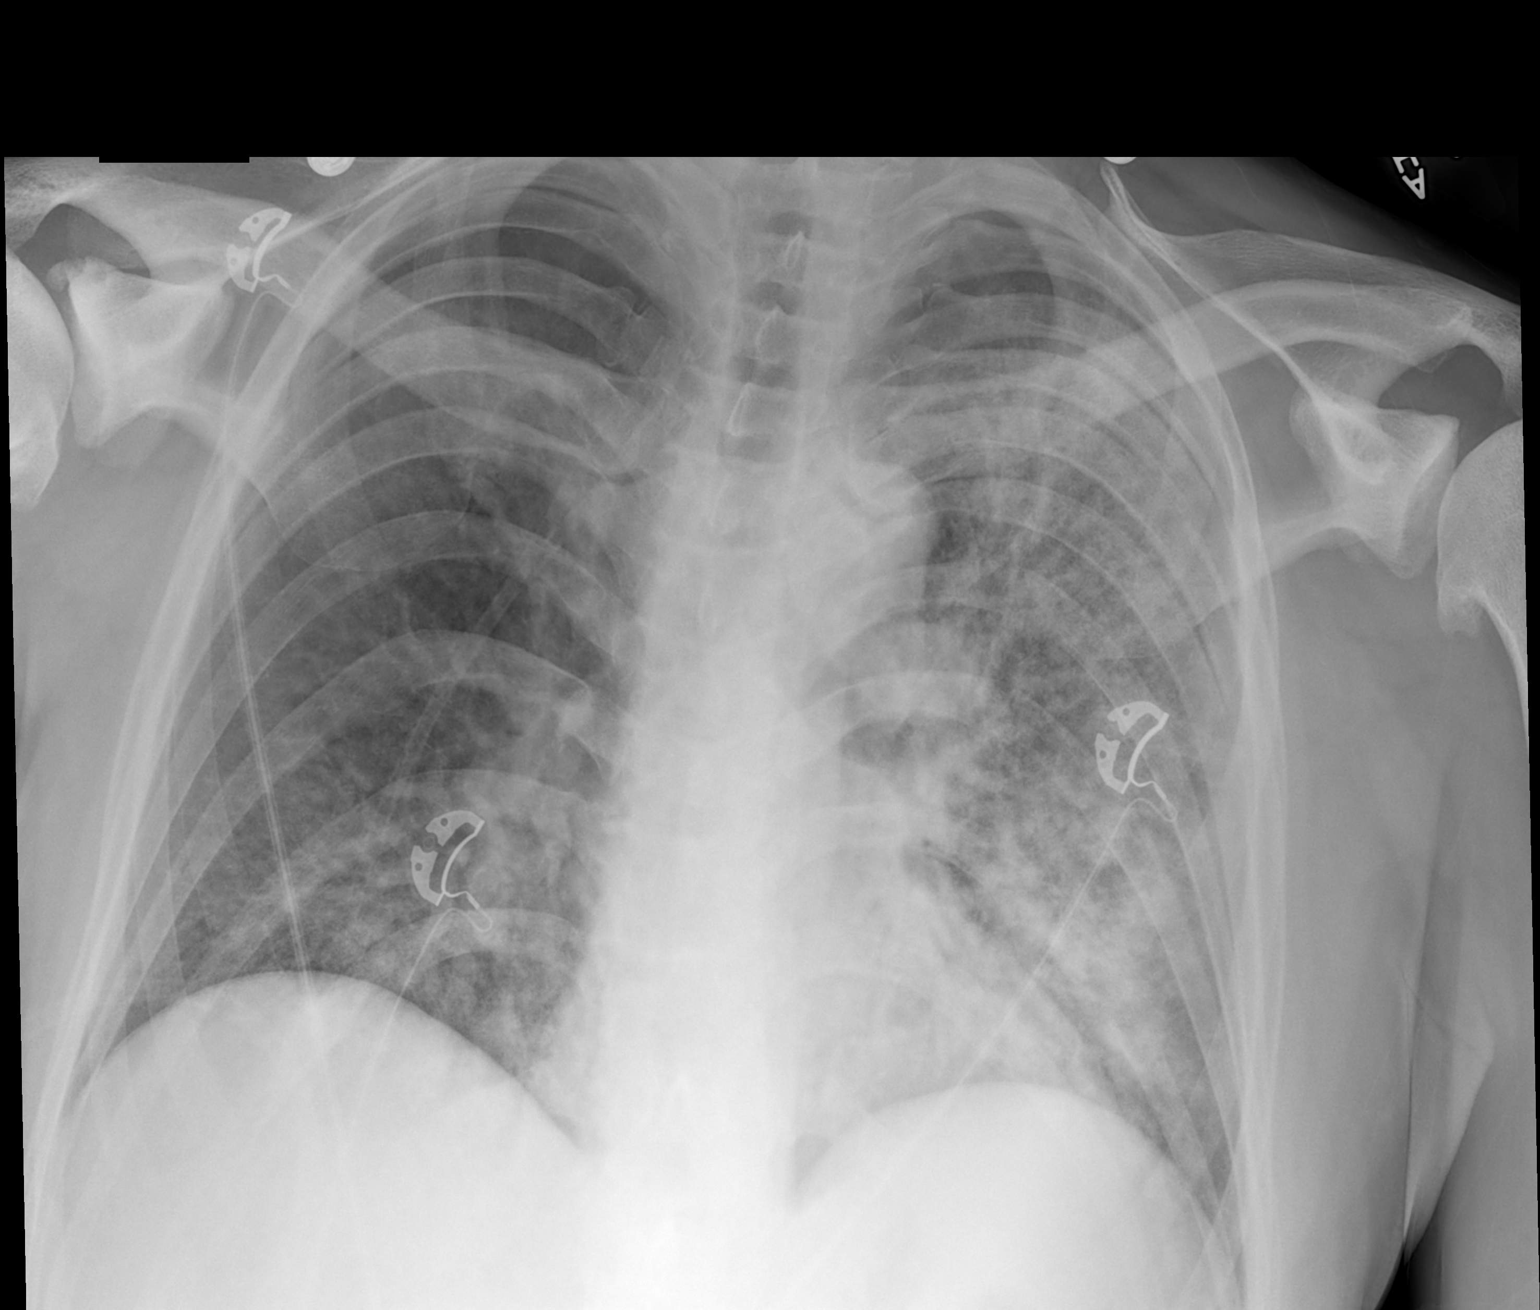

[1 of 1 positions shown; findings below may reference images not displayed]

FINDINGS: Low lung volumes. Cardiac silhouette is within normal limits.
Diffuse pulmonary opacities are appreciated within the left hemi
thorax and to a lesser extent right lung base. Osteoarthritic
changes are appreciated within the shoulders.
IMPRESSION: Increase conspicuity of the diffuse infiltrates left hemi thorax
with interval development of a right lower lobe infiltrate.

## 2016-03-01 IMAGING — CR DG CHEST 1V PORT
1 series · 1 of 1 positions shown · non-contrast
Comparison: DG CHEST 1V PORT dated 10/01/2013

CLINICAL DATA: central line placement

EXAM:
PORTABLE CHEST - 1 VIEW

[AP]
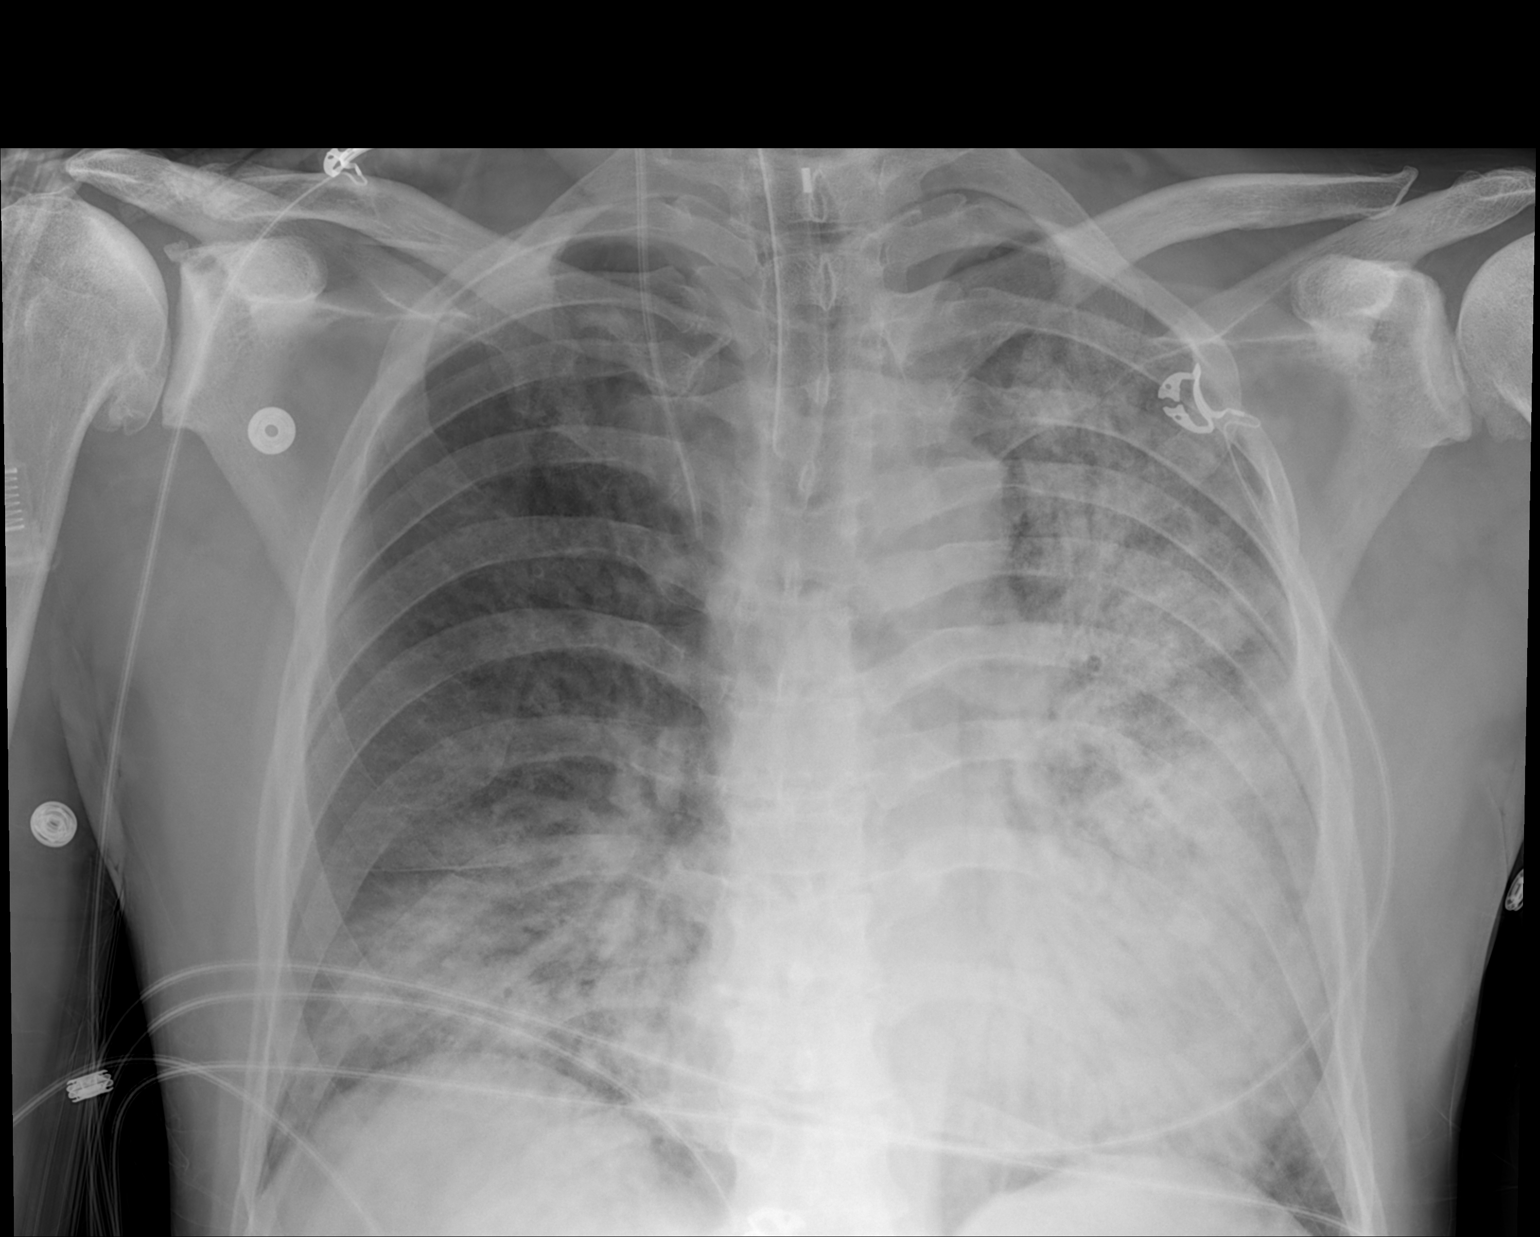

[1 of 1 positions shown; findings below may reference images not displayed]

FINDINGS: Endotracheal tube has been inserted tip 4.4 cm above the carina. A
right internal jugular catheter is appreciated tip at the level
superior vena cava. Stable bilateral pulmonary opacities are
appreciated right lung base, and throughout the left hemithorax. No
new focal regions of consolidation no evidence of pneumothorax.
Degenerative changes within the right lower shoulders.
IMPRESSION: Support lines and tubes adequately positioned

Bilateral pneumonitis unchanged from prior.

## 2016-04-02 IMAGING — CR DG CHEST 1V PORT
1 series · 1 of 1 positions shown · non-contrast
Comparison: 11/02/2013 and earlier.

CLINICAL DATA: 47-year-old male with respiratory failure. AIDS.
Initial encounter.

EXAM:
PORTABLE CHEST - 1 VIEW

[AP]
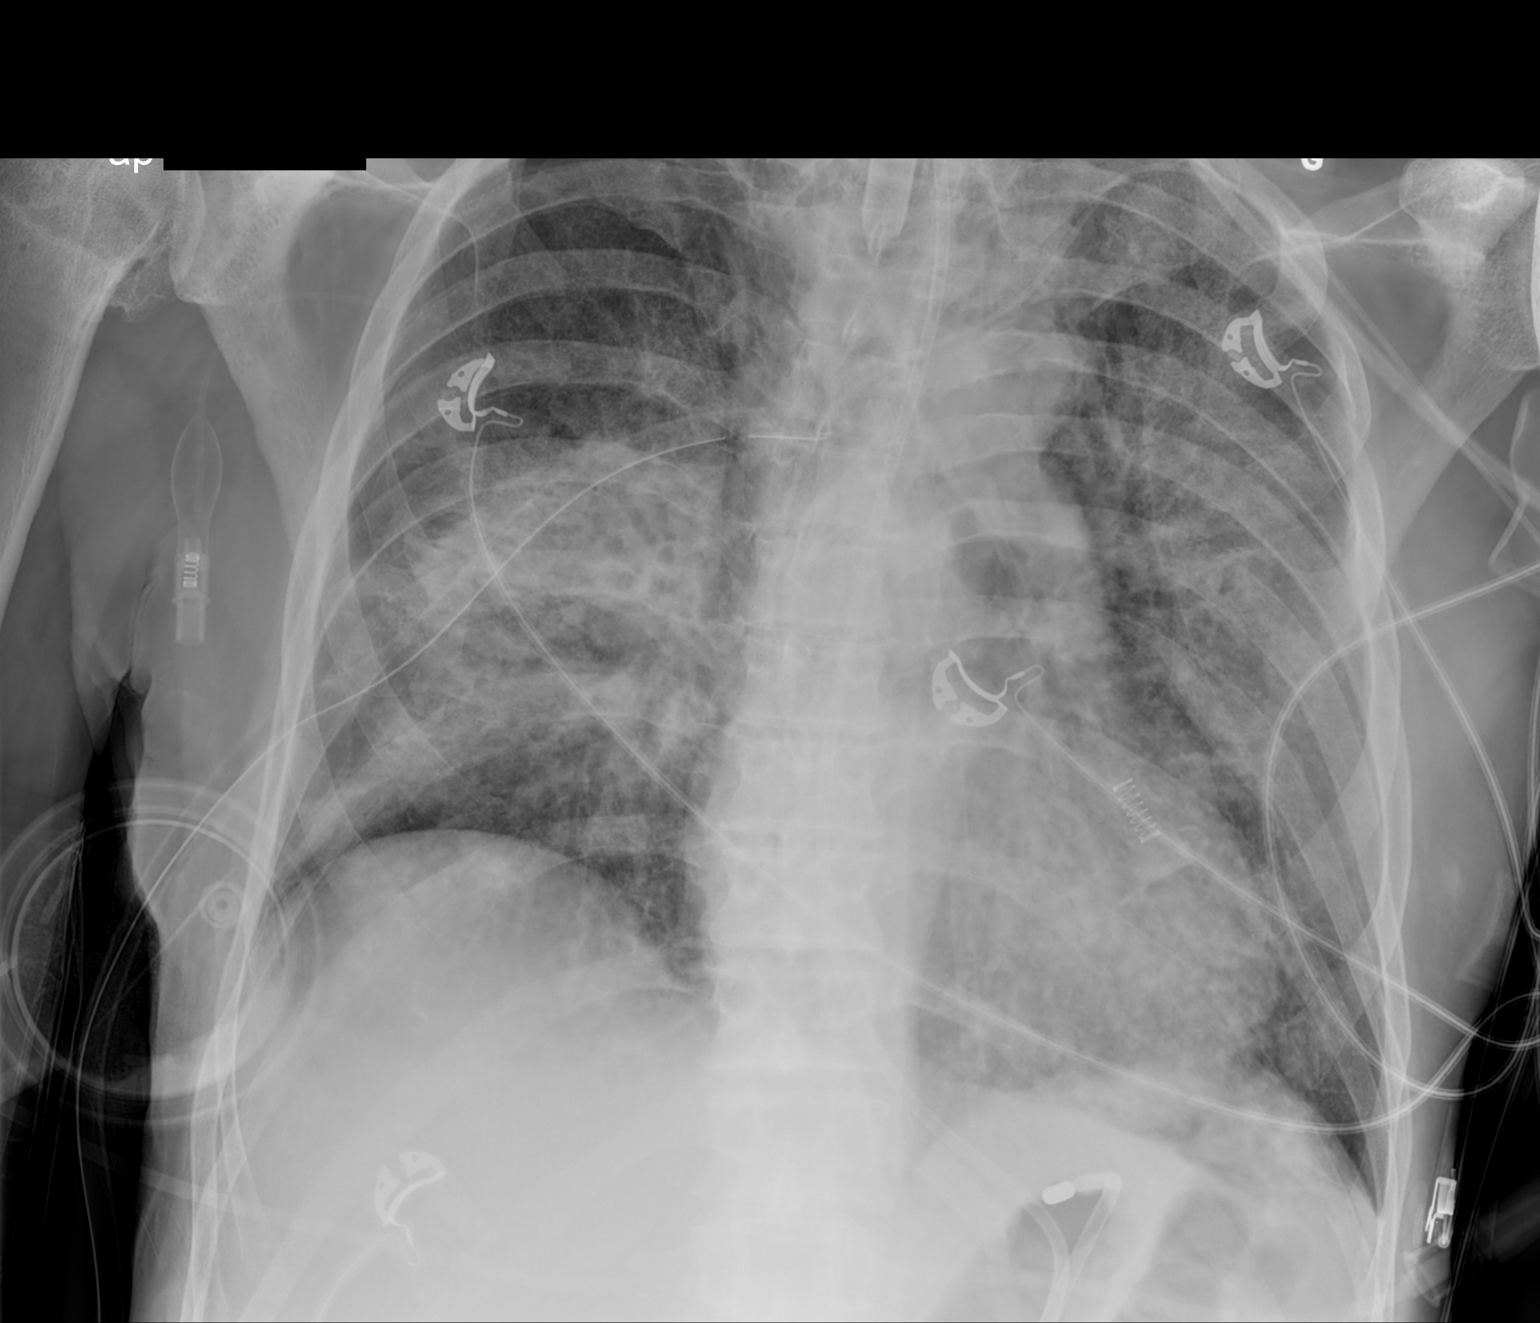

[1 of 1 positions shown; findings below may reference images not displayed]

FINDINGS: Portable AP semi upright view at 4814 hrs. Stable tracheostomy tube.
Enteric feeding tube courses to the abdomen, appears looped and tip
at the gastric fundus. Stable left PICC line. Stable right chest
tube.

A small to moderate pneumothorax now is visible on the right, with
pleural edge seen along the entire right lung periphery. New right
supraclavicular subcutaneous gas.

Confluent right perihilar and left upper lobe pulmonary opacity, not
significantly changed. No definite pleural effusion. Stable cardiac
size and mediastinal contours.
IMPRESSION: 1.  Stable lines and tubes.  Right chest tube remains in place.
2. Small to moderate right pneumothorax now visible. New right
supraclavicular subcutaneous gas.
3. Stable lung parenchyma with confluent opacities favored to be
pneumonia.
